# Patient Record
Sex: Male | Born: 1960
Health system: Southern US, Community
[De-identification: ages and names within clinical notes are randomized; demographics above are authoritative.]

## PROBLEM LIST (undated history)

## (undated) DIAGNOSIS — F17200 Nicotine dependence, unspecified, uncomplicated: Secondary | ICD-10-CM

## (undated) DIAGNOSIS — F79 Unspecified intellectual disabilities: Secondary | ICD-10-CM

## (undated) DIAGNOSIS — R7309 Other abnormal glucose: Secondary | ICD-10-CM

## (undated) DIAGNOSIS — J449 Chronic obstructive pulmonary disease, unspecified: Secondary | ICD-10-CM

## (undated) DIAGNOSIS — K219 Gastro-esophageal reflux disease without esophagitis: Secondary | ICD-10-CM

## (undated) DIAGNOSIS — G894 Chronic pain syndrome: Secondary | ICD-10-CM

## (undated) DIAGNOSIS — I10 Essential (primary) hypertension: Secondary | ICD-10-CM

## (undated) DIAGNOSIS — I2510 Atherosclerotic heart disease of native coronary artery without angina pectoris: Secondary | ICD-10-CM

## (undated) DIAGNOSIS — I82890 Acute embolism and thrombosis of other specified veins: Secondary | ICD-10-CM

## (undated) DIAGNOSIS — G4700 Insomnia, unspecified: Secondary | ICD-10-CM

## (undated) DIAGNOSIS — K449 Diaphragmatic hernia without obstruction or gangrene: Secondary | ICD-10-CM

## (undated) DIAGNOSIS — K861 Other chronic pancreatitis: Secondary | ICD-10-CM

## (undated) DIAGNOSIS — R569 Unspecified convulsions: Secondary | ICD-10-CM

## (undated) DIAGNOSIS — D649 Anemia, unspecified: Secondary | ICD-10-CM

## (undated) DIAGNOSIS — C3412 Malignant neoplasm of upper lobe, left bronchus or lung: Secondary | ICD-10-CM

## (undated) DIAGNOSIS — E46 Unspecified protein-calorie malnutrition: Secondary | ICD-10-CM

## (undated) DIAGNOSIS — R911 Solitary pulmonary nodule: Secondary | ICD-10-CM

## (undated) DIAGNOSIS — Z923 Personal history of irradiation: Secondary | ICD-10-CM

## (undated) HISTORY — PX: CORONARY ANGIOPLASTY WITH STENT PLACEMENT: SHX49

## (undated) HISTORY — DX: Other chronic pancreatitis: K86.1

## (undated) HISTORY — DX: Malignant neoplasm of upper lobe, left bronchus or lung: C34.12

## (undated) HISTORY — DX: Chronic pain syndrome: G89.4

## (undated) HISTORY — DX: Unspecified convulsions: R56.9

## (undated) HISTORY — DX: Insomnia, unspecified: G47.00

## (undated) HISTORY — DX: Gastro-esophageal reflux disease without esophagitis: K21.9

## (undated) HISTORY — DX: Nicotine dependence, unspecified, uncomplicated: F17.200

## (undated) HISTORY — DX: Other abnormal glucose: R73.09

## (undated) HISTORY — DX: Essential (primary) hypertension: I10

## (undated) HISTORY — DX: Anemia, unspecified: D64.9

## (undated) HISTORY — DX: Solitary pulmonary nodule: R91.1

## (undated) HISTORY — DX: Chronic obstructive pulmonary disease, unspecified: J44.9

---

## 2000-07-29 ENCOUNTER — Emergency Department (HOSPITAL_COMMUNITY): Admission: EM | Admit: 2000-07-29 | Discharge: 2000-07-29 | Payer: Self-pay | Admitting: Emergency Medicine

## 2001-06-05 ENCOUNTER — Emergency Department (HOSPITAL_COMMUNITY): Admission: EM | Admit: 2001-06-05 | Discharge: 2001-06-05 | Payer: Self-pay

## 2007-05-20 ENCOUNTER — Ambulatory Visit: Admission: RE | Admit: 2007-05-20 | Discharge: 2007-05-20 | Payer: Self-pay | Admitting: Family Medicine

## 2008-01-13 DIAGNOSIS — E46 Unspecified protein-calorie malnutrition: Secondary | ICD-10-CM

## 2008-01-13 HISTORY — DX: Unspecified protein-calorie malnutrition: E46

## 2008-11-28 HISTORY — PX: PORTACATH PLACEMENT: SHX2246

## 2008-12-11 ENCOUNTER — Ambulatory Visit (HOSPITAL_COMMUNITY): Admission: RE | Admit: 2008-12-11 | Discharge: 2008-12-11 | Payer: Self-pay | Admitting: Internal Medicine

## 2008-12-11 HISTORY — PX: GASTROSTOMY-JEJEUNOSTOMY TUBE CHANGE/PLACEMENT: SHX1705

## 2009-03-26 HISTORY — PX: EUS: SHX5427

## 2010-02-02 ENCOUNTER — Encounter (INDEPENDENT_AMBULATORY_CARE_PROVIDER_SITE_OTHER): Payer: Self-pay | Admitting: Internal Medicine

## 2010-05-30 NOTE — Procedures (Signed)
NAME:  Marcus Brooks, Marcus Brooks                 ACCOUNT NO.:  [id number]   MEDICAL RECORD NO.:  [id number]          PATIENT TYPE:  OUT   LOCATION:  SLEE                          FACILITY:  [facility]   PHYSICIAN:  Kofi A. Gerilyn Pilgrim, M.D. DATE OF BIRTH:  1960-02-11   DATE OF PROCEDURE:  DATE OF DISCHARGE:  05/20/2007                             SLEEP DISORDER REPORT   REFERRING PHYSICIAN:  Kofi A. Doonquah, MD.   INDICATIONS:  A 50 year old man who has snoring and has been evaluated  for obstructive sleep apnea syndrome.  BMI 20.  Epworth sleepiness scale  7.   MEDICATIONS:  Trazodone, phenytoin, omeprazole, hydrocodone, loratadine,  and Creon.   SLEEP STAGE SUMMARY:  The total recording time was 427 minutes.  Sleep  efficiency 97%.  Sleep latency 5 minutes.  REM latency 70 minutes.  Stage N1 2%, N2 31%, N3 42%, and REM sleep 25%.   RESPIRATORY SUMMARY:  AHI is 1.2, baseline saturation 96%, and lowest  saturation 90%.   LIMB MOVEMENT SUMMARY:  The PLM index is 0.   ELECTROCARDIOGRAM SUMMARY:  Average heart rate 71 with no dysrhythmias  observed.   IMPRESSION:  Essentially unremarkable nocturnal polysomnography.      Kofi A. Gerilyn Pilgrim, M.D.  Electronically Signed     KAD/MEDQ  D:  05/21/2007  T:  05/22/2007  Job:  [id number]

## 2010-07-13 ENCOUNTER — Inpatient Hospital Stay (HOSPITAL_COMMUNITY)
Admission: EM | Admit: 2010-07-13 | Discharge: 2010-07-17 | DRG: 439 | Disposition: A | Discharge status: Home Health | Payer: Medicaid Other | Attending: Internal Medicine | Admitting: Internal Medicine

## 2010-07-13 DIAGNOSIS — I2510 Atherosclerotic heart disease of native coronary artery without angina pectoris: Secondary | ICD-10-CM | POA: Diagnosis present

## 2010-07-13 DIAGNOSIS — K9402 Colostomy infection: Secondary | ICD-10-CM | POA: Diagnosis present

## 2010-07-13 DIAGNOSIS — G40909 Epilepsy, unspecified, not intractable, without status epilepticus: Secondary | ICD-10-CM | POA: Diagnosis present

## 2010-07-13 DIAGNOSIS — E559 Vitamin D deficiency, unspecified: Secondary | ICD-10-CM | POA: Diagnosis present

## 2010-07-13 DIAGNOSIS — K859 Acute pancreatitis without necrosis or infection, unspecified: Principal | ICD-10-CM | POA: Diagnosis present

## 2010-07-13 DIAGNOSIS — B49 Unspecified mycosis: Secondary | ICD-10-CM | POA: Diagnosis present

## 2010-07-13 DIAGNOSIS — K861 Other chronic pancreatitis: Secondary | ICD-10-CM | POA: Diagnosis present

## 2010-07-13 DIAGNOSIS — D638 Anemia in other chronic diseases classified elsewhere: Secondary | ICD-10-CM | POA: Diagnosis present

## 2010-07-13 DIAGNOSIS — E538 Deficiency of other specified B group vitamins: Secondary | ICD-10-CM | POA: Diagnosis present

## 2010-07-13 DIAGNOSIS — IMO0002 Reserved for concepts with insufficient information to code with codable children: Secondary | ICD-10-CM | POA: Diagnosis present

## 2010-07-13 HISTORY — PX: PANCREATIC PSEUDOCYST DRAINAGE: SHX2158

## 2010-07-13 HISTORY — PX: JEJUNOSTOMY FEEDING TUBE: SUR737

## 2010-07-13 LAB — CBC
MCV: 96 fL (ref 78.0–100.0)
Platelets: 413 10*3/uL — ABNORMAL HIGH (ref 150–400)
RDW: 14.1 % (ref 11.5–15.5)

## 2010-07-13 LAB — COMPREHENSIVE METABOLIC PANEL
BUN: 15 mg/dL (ref 6–23)
CO2: 24 mEq/L (ref 19–32)
Calcium: 10.1 mg/dL (ref 8.4–10.5)
Creatinine, Ser: 0.63 mg/dL (ref 0.50–1.35)
GFR calc Af Amer: >60 mL/min (ref >60)
GFR calc non Af Amer: >60 mL/min (ref >60)
Glucose, Bld: 162 mg/dL — ABNORMAL HIGH (ref 70–99)
Total Protein: 8.9 g/dL — ABNORMAL HIGH (ref 6.0–8.3)

## 2010-07-13 LAB — DIFFERENTIAL
Basophils Relative: 0 % (ref 0–1)
Blasts: 0 %
Eosinophils Absolute: 0.4 10*3/uL (ref 0.0–0.7)
Lymphs Abs: 1.9 10*3/uL (ref 0.7–4.0)
Myelocytes: 0 %
Neutro Abs: 17.4 10*3/uL — ABNORMAL HIGH (ref 1.7–7.7)
nRBC: 0 /100 WBC

## 2010-07-14 ENCOUNTER — Inpatient Hospital Stay (HOSPITAL_COMMUNITY): Payer: Medicaid Other

## 2010-07-14 LAB — COMPREHENSIVE METABOLIC PANEL
ALT: 39 U/L (ref 0–53)
AST: 35 U/L (ref 0–37)
Calcium: 9.4 mg/dL (ref 8.4–10.5)
Creatinine, Ser: 0.62 mg/dL (ref 0.50–1.35)
GFR calc Af Amer: >60 mL/min (ref >60)
GFR calc non Af Amer: >60 mL/min (ref >60)
Glucose, Bld: 176 mg/dL — ABNORMAL HIGH (ref 70–99)
Sodium: 135 mEq/L (ref 135–145)
Total Protein: 8 g/dL (ref 6.0–8.3)

## 2010-07-14 LAB — APTT: aPTT: 62 seconds — ABNORMAL HIGH (ref 24–37)

## 2010-07-14 LAB — PROTIME-INR: Prothrombin Time: 18.1 seconds — ABNORMAL HIGH (ref 11.6–15.2)

## 2010-07-14 LAB — CBC
MCV: 97.1 fL (ref 78.0–100.0)
Platelets: 396 10*3/uL (ref 150–400)
RBC: 2.74 MIL/uL — ABNORMAL LOW (ref 4.22–5.81)
RDW: 14.2 % (ref 11.5–15.5)
WBC: 19 10*3/uL — ABNORMAL HIGH (ref 4.0–10.5)

## 2010-07-14 LAB — DIFFERENTIAL
Basophils Relative: 0 % (ref 0–1)
Eosinophils Absolute: 0.3 10*3/uL (ref 0.0–0.7)
Monocytes Relative: 8 % (ref 3–12)
Neutro Abs: 14.9 10*3/uL — ABNORMAL HIGH (ref 1.7–7.7)
Neutrophils Relative %: 79 % — ABNORMAL HIGH (ref 43–77)

## 2010-07-14 LAB — PHOSPHORUS: Phosphorus: 3.4 mg/dL (ref 2.3–4.6)

## 2010-07-15 ENCOUNTER — Inpatient Hospital Stay (HOSPITAL_COMMUNITY): Payer: Medicaid Other

## 2010-07-15 DIAGNOSIS — K859 Acute pancreatitis without necrosis or infection, unspecified: Secondary | ICD-10-CM

## 2010-07-15 DIAGNOSIS — Z931 Gastrostomy status: Secondary | ICD-10-CM

## 2010-07-15 LAB — DIFFERENTIAL
Basophils Absolute: 0.1 10*3/uL (ref 0.0–0.1)
Basophils Relative: 1 % (ref 0–1)
Eosinophils Absolute: 0.4 10*3/uL (ref 0.0–0.7)
Eosinophils Relative: 3 % (ref 0–5)
Lymphocytes Relative: 17 % (ref 12–46)
Lymphs Abs: 2.6 10*3/uL (ref 0.7–4.0)
Monocytes Absolute: 1.2 10*3/uL — ABNORMAL HIGH (ref 0.1–1.0)
Monocytes Relative: 8 % (ref 3–12)
Neutro Abs: 10.8 10*3/uL — ABNORMAL HIGH (ref 1.7–7.7)
Neutrophils Relative %: 71 % (ref 43–77)

## 2010-07-15 LAB — CBC
HCT: 25.8 % — ABNORMAL LOW (ref 39.0–52.0)
Hemoglobin: 8.7 g/dL — ABNORMAL LOW (ref 13.0–17.0)
MCH: 32.5 pg (ref 26.0–34.0)
MCHC: 33.7 g/dL (ref 30.0–36.0)
MCV: 96.3 fL (ref 78.0–100.0)
Platelets: 430 10*3/uL — ABNORMAL HIGH (ref 150–400)
RBC: 2.68 MIL/uL — ABNORMAL LOW (ref 4.22–5.81)
RDW: 14.1 % (ref 11.5–15.5)
WBC: 15.1 10*3/uL — ABNORMAL HIGH (ref 4.0–10.5)

## 2010-07-15 LAB — BASIC METABOLIC PANEL
BUN: 9 mg/dL (ref 6–23)
CO2: 22 mEq/L (ref 19–32)
Calcium: 8.4 mg/dL (ref 8.4–10.5)
Chloride: 104 mEq/L (ref 96–112)
Creatinine, Ser: 0.72 mg/dL (ref 0.50–1.35)
GFR calc Af Amer: >60 mL/min (ref >60)
GFR calc non Af Amer: >60 mL/min (ref >60)
Glucose, Bld: 128 mg/dL — ABNORMAL HIGH (ref 70–99)
Potassium: 3.5 mEq/L (ref 3.5–5.1)
Sodium: 137 mEq/L (ref 135–145)

## 2010-07-16 LAB — CBC
HCT: 25.7 % — ABNORMAL LOW (ref 39.0–52.0)
Hemoglobin: 8.6 g/dL — ABNORMAL LOW (ref 13.0–17.0)
MCH: 32.3 pg (ref 26.0–34.0)
MCHC: 33.5 g/dL (ref 30.0–36.0)
MCV: 96.6 fL (ref 78.0–100.0)
Platelets: 435 10*3/uL — ABNORMAL HIGH (ref 150–400)
RBC: 2.66 MIL/uL — ABNORMAL LOW (ref 4.22–5.81)
RDW: 14.2 % (ref 11.5–15.5)
WBC: 9.4 10*3/uL (ref 4.0–10.5)

## 2010-07-16 LAB — DIFFERENTIAL
Basophils Absolute: 0.1 10*3/uL (ref 0.0–0.1)
Basophils Relative: 1 % (ref 0–1)
Eosinophils Absolute: 0.5 10*3/uL (ref 0.0–0.7)
Eosinophils Relative: 5 % (ref 0–5)
Lymphocytes Relative: 23 % (ref 12–46)
Lymphs Abs: 2.2 10*3/uL (ref 0.7–4.0)
Monocytes Absolute: 1 10*3/uL (ref 0.1–1.0)
Monocytes Relative: 10 % (ref 3–12)
Neutro Abs: 5.6 10*3/uL (ref 1.7–7.7)
Neutrophils Relative %: 60 % (ref 43–77)

## 2010-07-16 LAB — BASIC METABOLIC PANEL
BUN: 8 mg/dL (ref 6–23)
CO2: 22 mEq/L (ref 19–32)
Calcium: 8.4 mg/dL (ref 8.4–10.5)
Chloride: 106 mEq/L (ref 96–112)
Creatinine, Ser: 0.62 mg/dL (ref 0.50–1.35)
GFR calc Af Amer: >60 mL/min (ref >60)
GFR calc non Af Amer: >60 mL/min (ref >60)
Glucose, Bld: 124 mg/dL — ABNORMAL HIGH (ref 70–99)
Potassium: 3.9 mEq/L (ref 3.5–5.1)
Sodium: 136 mEq/L (ref 135–145)

## 2010-07-16 LAB — IRON AND TIBC: UIBC: 116 ug/dL

## 2010-07-17 LAB — CBC
HCT: 27 % — ABNORMAL LOW (ref 39.0–52.0)
Hemoglobin: 9.1 g/dL — ABNORMAL LOW (ref 13.0–17.0)
MCH: 32.7 pg (ref 26.0–34.0)
MCHC: 33.7 g/dL (ref 30.0–36.0)

## 2010-07-17 LAB — COMPREHENSIVE METABOLIC PANEL
ALT: 55 U/L — ABNORMAL HIGH (ref 0–53)
AST: 47 U/L — ABNORMAL HIGH (ref 0–37)
Alkaline Phosphatase: 93 U/L (ref 39–117)
CO2: 21 mEq/L (ref 19–32)
Calcium: 8.7 mg/dL (ref 8.4–10.5)
GFR calc non Af Amer: >60 mL/min (ref >60)
Potassium: 4.4 mEq/L (ref 3.5–5.1)
Sodium: 135 mEq/L (ref 135–145)

## 2010-07-17 LAB — DIFFERENTIAL
Basophils Absolute: 0.2 10*3/uL — ABNORMAL HIGH (ref 0.0–0.1)
Eosinophils Absolute: 0.5 10*3/uL (ref 0.0–0.7)
Eosinophils Relative: 4 % (ref 0–5)
Lymphocytes Relative: 17 % (ref 12–46)
Lymphs Abs: 2.1 10*3/uL (ref 0.7–4.0)
Monocytes Absolute: 0.8 10*3/uL (ref 0.1–1.0)

## 2010-07-17 LAB — VITAMIN B12: Vitamin B-12: >2000 pg/mL — ABNORMAL HIGH (ref 211–911)

## 2010-07-18 LAB — CULTURE, BLOOD (ROUTINE X 2): Culture: NO GROWTH

## 2010-07-21 LAB — FUNGUS CULTURE, BLOOD
Culture: NO GROWTH
Culture: NO GROWTH

## 2010-07-22 NOTE — Consult Note (Signed)
Marcus Brooks, Marcus Brooks                 ACCOUNT NO.:  [id number]  MEDICAL RECORD NO.:  [id number]  LOCATION:  A331                          FACILITY:  [facility]  PHYSICIAN:  Lionel December, M.D.    DATE OF BIRTH:  August 06, 1960  DATE OF CONSULTATION:  07/15/2010 DATE OF DISCHARGE:                                CONSULTATION REASON FOR CONSULTATION. G/J tube problems.   HISTORY OF PRESENT ILLNESS:  Marcus Brooks is a 50 year old male admitted through the emergency department Sunday night at [facility].  He was complaining of abdominal pain and back pain.  He was also complaining of pain at his PEG (J-tube) site.  He was actually seen at [facility] Friday night for fever and abdominal pain.  He was told he had acute pancreatitis.  He underwent a CT of the abdomen and pelvis at [facility], which revealed extensive presumed inflammatory process in the left upper quadrant, extending into the left lower chest and retrocrural spaces with multiple loculated fluid collections, retroperitoneal lymphadenopathy, small left pleural effusion, and focal cavitary lesion in the left lung.  Changes of chronic calcific pancreatitis.  Findings most likely represent chronic inflammatory process with pseudocyst related to chronic pancreatitis.  Neoplasm is not excluded.  Findings likely represent organizing changes since the previous study.  No definite evidence of abscess.  He also underwent blood cultures at [facility].  The blood cultures on July 11, 2010, revealed budding yeast.  Marcus Brooks was called this report Sunday night.  He left AMA at [facility] Sunday.  PAST MEDICAL HISTORY:  Includes chronic pancreatitis, seizures.  He has a cardiac stent, hypertension.  He has got a J tube.  Vitamin D deficiency.  ALLERGIES:  No known allergies.  SOCIAL HISTORY:  He smokes 1 pack a day.  He says he was drinking EtOH, he quit 4-5 years ago.  No drugs and he is separated.  He has 1 child in good health.  He  is adopted.  FAMILY HISTORY:  His father and mother deceased from unknown causes. One brother in good health.  HOME MEDICATIONS: 1. Fentanyl patch 50 mcg an hour. 2. Hydrocodone/APAP 10/325 q.6 as needed. 3. Sumatriptan 100 mg as needed. 4. Potassium 10 mEq a day. 5. Loratadine 10 mg a day. 6. Amitriptyline 25 mg at night. 7. Loperamide 2 mg as needed. 8. Diazepam 10 mg b.i.d. 9. Benadryl 25 mg q.6. 10.Omeprazole 20 mg b.i.d. 11.Phenergan 25 mg p.o. 12.Flonase 1 puff daily. 13.Neurontin 300 mg 2 tabs a day. 14.Vitamin D3 1000 units a day. 15.Vitamin B12 1000 mcg IM a month. 16.Norvasc 10 mg a day. 17.Trazodone 100 mg at bedtime. 18.Albuterol inhaler q.6 2 puffs p.r.n. 19.Dilantin 100 mg t.i.d. 20.HCTZ 25 mg a day.  OBJECTIVE:  VITAL SIGNS:  His temp is 99.3, pulse 96, respirations 20, blood pressure 145/79, his O2 sat is 93%, his weight is 74.84, and his height is 5 feet 7 inches. HEENT:  His sclerae are anicteric.  His conjunctivae are pink.  He is an edentulous.  His oral mucosa is moist. NECK:  His thyroid is normal.  There is no cervical lymphadenopathy. LUNGS:  Clear. HEART:  Regular rate and  rhythm. ABDOMEN:  Slightly tense.  He has diffuse abdominal tenderness.  His bowel sounds are positive.  He does have yellow drainage around his J tube. J tubing is discolored a brownish discoloration.  LABS:  Today's date, his WBC count is 15.1, elevated, RBC 2.68, hemoglobin is low at 8.7, hematocrit is 25.8, MCV is 96.8, platelet count is 430, neutrophils 10.8, monocytes 1.2.  His sodium is 137, potassium is 3.5, chloride is 104, glucose is 128, BUN is 9, creatinine is 0.72, calcium is 8.4.  His lipase on July 13, 2010, was 506.  Dilantin level was less than 2.5.  He did undergo a chest x-ray which revealed a left lower lobe atelectasis or infiltrate with small left effusion.  ASSESSMENT:  Marcus Brooks is a 50 year old male admitted with acute pancreatitis.  He does have a history  of pseudocyst.  His blood cultures revealed budding yeast.  He does have a drainage around his J tube.  I did discuss this case with Dr. Karilyn Cota.  RECOMMENDATIONS: I have arrnged for patient to go to [facility] for  PEG/PEJ exchange later today.  Thank you for allowing [place] to participate in his care.    ______________________________ Dorene Ar, NP   ______________________________ Lionel December, M.D.    TS/MEDQ  D:  07/15/2010  T:  07/15/2010  Job:  [id number]  Electronically Signed by Dorene Ar PA on 07/17/2010 08:57:17 AM Electronically Signed by Lionel December M.D. on 07/22/2010 10:14:22 PM

## 2010-07-28 NOTE — H&P (Signed)
NAME:  Marcus Brooks, Marcus Brooks                 ACCOUNT NO.:  [id number]  MEDICAL RECORD NO.:  [id number]  LOCATION:  A331                          FACILITY:  [facility]  PHYSICIAN:  Merit Maybee, DO         DATE OF BIRTH:  August 06, 1960  DATE OF ADMISSION:  07/13/2010 DATE OF DISCHARGE:  LH                             HISTORY & PHYSICAL   CHIEF COMPLAINT:  Abdominal pain and report of positive blood cultures.  HISTORY OF PRESENT ILLNESS:  The patient is a 50 year old chronically ill male who was in the emergency department at [facility] on July 11, 2010, where he was to be admitted for an acute exacerbation of chronic pancreatitis and possible malfunction of his feeding tube. The wait was extended.  According to the patient's wife, there was some trouble with an associated hospital in [place] and the patients were being transferred down to [facility] and so they were unable to get him a bed in a timely manner.  The patient also complains that he felt that he was not given sufficient pain medicine and so he left against medical advice.  The patient's wife was contacted today until one of his two blood cultures came back positive with budding yeast.  The patient has continued to have pain.  He has not been using his J-tube today, and he presents here for the above chief complaint.  PAST MEDICAL HISTORY:  Significant for: 1. Chronic pancreatitis. 2. Pancreatic insufficiency. 3. Possible fungal bacteremia. 4. J-tube.  The J-tube was evaluated at [facility] and it was     found to have the tip in the proximal jejunum loop.  He uses 6 cans     of Ensure daily. 5. He has chronic seizure disorder, for which he takes Dilantin. 6. Coronary artery disease status post stent. 7. Hypertension. 8. Vitamin D deficiency. 9. Vitamin B12 deficiency.  PAST SURGICAL HISTORY:  Significant for: 1. Left heart cath and stent. 2. J-tube.  SOCIAL HISTORY:  The patient is on disability.  He  apparently was once a heavy drinker.  He denies any current alcohol use.  He smokes 1-1/2 packs per day.  He has an electronic cigarette with him.  He lives at home with his wife.  FAMILY MEDICAL HISTORY:  The patient is adopted.  MEDICATIONS:  This is as per the [facility] list: 1. Amlodipine 10 mg one p.o. daily. 2. Diazepam 10 mg one p.o. b.i.d. p.r.n. anxiety. 3. Benadryl 25 mg one p.o. q.i.d. p.r.n. allergies. 4. Flonase nasal spray daily. 5. Dilantin 300 mg one p.o. nightly. 6. Phenergan 25 mg q.6 h. p.r.n. nausea. 7. Omeprazole 20 mg one p.o. b.i.d. 8. Hydrochlorothiazide 25 mg one p.o. daily. 9. Gabapentin 300 mg 2 tablets p.o. b.i.d. 10.Albuterol inhaler as needed for shortness of breath. 11.Vitamin B12 - 1000 mcg injected IM once monthly. 12.Vitamin D 50,000 units daily. 13.Trazodone 100 mg nightly as needed for sleep. 14.Imodium p.r.n. for constipation. 15.Potassium 10 mEq one p.o. daily. 16.Hydrocodone and acetaminophen 10/650 one p.o. q.6 h. as needed for     pain. 17.Duragesic patch 50 mcg, changed every 3 days for chronic abdominal  pain. 18.Imitrex for migraines. 19.Amitriptyline 25 mg nightly.  DRUG ALLERGIES:  No known drug allergies.  REVIEW OF SYSTEMS:  CONSTITUTIONAL:  Positive for fever, positive for chills, positive for weakness, positive for  malaise.  CENTRAL NERVOUS SYSTEM:  Negative for specific limb weakness.  Negative for seizure. Negative for loss of consciousness.  ENT:  No nasal congestion, throat pain.  No coryza.  CARDIOVASCULAR:  No chest pain, no palpitations, no orthopnea.  RESPIRATORY:  Positive for cough.  Positive for wheeze. Positive for shortness of breath.  No sputum production. GASTROINTESTINAL:  Positive for abdominal pain.  Positive for nausea. Positive for vomiting.  Positive for chronic constipation.  Negative for diarrhea.  GENITOURINARY:  No dysuria, no hematuria, no urinary frequency.  RENAL:  No flank  pain, no swelling, no pruritus.  SKIN: Positive for tattoos, otherwise, no rashes, no sores, no lesions. HEMATOLOGIC:  No easy bruising.  No purpura.  No clots.  LYMPHATIC:  No lymphadenopathy.  No painful lymph nodes.  No specific lymph swelling. PSYCHIATRIC:  Positive for anxiety.  Negative for depression.  Positive for insomnia.  PHYSICAL EXAMINATION:  VITAL SIGNS:  Heart rate 80, temperature 98.3, blood pressure 123/62, respiratory rate 20.  The patient was satting 97% on room air. GENERAL:  The patient is an unkempt male who has a severely slurred speech, although perhaps this is his norm.  His wife understands them, I cannot understand this man.  He is awake, alert, and oriented x3 and apparently responds appropriately.  His wife is in the room and she is able to relate what the patient is saying, and he does not follow commands. HEENT:  Eyes:  Pupils equal, round, and reactive to light and accommodation.  External ocular movements bilaterally intact.  Sclerae nonicteric, noninjected.  Mouth:  Oral mucosa dry.  No lesions.  No sores.  Pharynx clear.  No erythema, no exudate. NECK:  Negative for JVD.  Negative thyromegaly.  Negative lymphadenopathy. HEART:  Regular rate and rhythm at 90 beats per minute without murmur, ectopy, or gallops.  No lateral PMI.  No thrills. LUNGS:  Positive for rhonchi and wheezes throughout.  No rales. Negative for increased work of breathing.  No tactile fremitus. ABDOMEN:  Difficult to examine as the patient is voluntarily guarding. Anywhere I even touch him, he cries out in pain.  Positive bowel sounds. I am unable to evaluate for organomegaly due to the patient's voluntary guarding.  No hernias palpated.  CARDIOVASCULAR:  Extremities are negative for cyanosis, clubbing, or edema.  The patient has somewhat diminished dorsalis pedis and popliteal pulses bilaterally.  No carotid bruits bilaterally. NEUROLOGIC:  The patient is moving all extremities.   Cranial nerves II through XII are grossly intact.  Motor and sensory intact.  LABORATORY DATA:  WBC is 21.2, hemoglobin 9.9, hematocrit 29.0, platelets 413 with 82% neutrophils.  Sodium 134, potassium 3.8, chloride 100, CO2 is 24, BUN 15, creatinine 0.63, glucose 162, T. bili 0.7, alk phos 123, AST 38, ALT 44, total protein 8.9, albumin 2.1, lipase 506, lactic acid 1.2.  EKG is pending. Chest x-ray is pending.  ASSESSMENT: 1. Acute exacerbation of acute pancreatitis. 2. Pancreatic pseudocyst as per radiological report for [facility]. 3. Budding yeast in blood cultures one out of two from [facility]. 4. Acute bronchitis on chronic bronchitis. 5. Chronic pain and opioid dependence. 6. Hypertension. 7. Pancreatic insufficiency. 8. Vitamin deficiency, D and B12.  PLAN: 1.  Admit.  Continue tube feedings but have Nutrition evaluate the     patient's needs. 2. IV antibiotics. 3. Antifungals.  I have discussed with the emergency room staff the     need to recheck blood cultures and those have been drawn. 4. Continue home meds. 5. Check a Dilantin level. 6. Pain control.  I have spent 48 minutes on this admission.                                           ______________________________ Fran Lowes, DO     AS/MEDQ  D:  07/13/2010  T:  07/14/2010  Job:  [id number]  cc:   Ernestine Conrad, MD Fax: [phone]  Electronically Signed by Fran Lowes DO on 07/28/2010 01:46:39 PM

## 2010-07-30 NOTE — Discharge Summary (Signed)
Marcus Brooks, Marcus Brooks                 ACCOUNT NO.:  [id number]  MEDICAL RECORD NO.:  [id number]  LOCATION:  A331                          FACILITY:  [facility]  PHYSICIAN:  Marcus Brooks, M.D. DATE OF BIRTH:  07-08-1960  DATE OF ADMISSION:  07/13/2010 DATE OF DISCHARGE:  07/05/2012LH                              DISCHARGE SUMMARY   PATIENT'S GASTROENTEROLOGIST:  Marcus December, MD  DISCHARGE DIAGNOSES: 1. Chronic pancreatitis with acute flare. 2. Reported fungemia from [facility] with 1 out of 2 blood     cultures growing budding yeast. 3. Normocytic anemia, secondary to anemia of chronic disease with     hemoglobin that has been stable in the 8.5-9 range.  No     transfusions this hospitalization. 4. Vitamin B12 deficiency. 5. Vitamin D deficiency. 6. Chronic seizure disorder. 7. Coronary artery disease.  DISCHARGE MEDICATIONS: 1. Fluconazole 100 mg daily for 14 days. 2. Vital AF 1.2 at 70 mL an hour continuously. 3. Albuterol inhaler 2 puffs every 6 h. as needed for shortness of     breath. 4. Amitriptyline 25 mg at bedtime. 5. Norvasc 10 mg daily. 6. Benadryl 25 mg every 6 h. as needed for itching. 7. Diazepam 10 mg twice daily. 8. Dilantin 100 mg 3 times a day. 9. Fentanyl patch 50 mcg to change every 72 h. 10.Flonase 1 spray daily. 11.Vicodin 10/325 mg every 6 h. as needed for pain. 12.Loperamide 2 mg daily as needed for diarrhea. 13.Loratadine 10 mg daily. 14.Neurontin 300 mg 2 tablets twice daily. 15.Omeprazole 20 mg twice daily. 16.Potassium chloride 10 mEq daily. 17.Sumatriptan 100 mg as needed for migraines. 18.Trazodone 100 mg at bedtime. 19.Vitamin B12 1000 mcg intramuscularly monthly. 20.Vitamin D3 1000 units daily.  DISPOSITION AND FOLLOWUP:  Marcus Brooks will be discharged home today in stable and improved condition.  He is tolerating his J-tube feedings without any abdominal pain.  He will need to have repeat CT abdomen and pelvis in 3 weeks to  follow up on his pseudocyst and following that he will need to follow up with Marcus Brooks.  The patient has been made aware of the need to schedule followup with Marcus Brooks.  CONSULTATION THIS HOSPITALIZATION:  Marcus December, MD  IMAGES AND PROCEDURES:  Chest x-ray on July 14, 2010, that showed a left lower lobe atelectasis with effusion.  HISTORY AND PHYSICAL:  For complete details, please refer to dictation on July 13, 2010, by Dr. Gerri Brooks.  However in brief, Marcus Brooks is a 50- year-old Caucasian gentleman with a history of chronic pancreatic insufficiency who apparently went to [facility] on July 11, 2010, with abdominal pain and was to be admitted for an acute flare-up of his chronic pancreatitis.  However, there was a delay in transitioning into the floor given the volume of the patient's at [facility] and he decided to leave the emergency department AMA, 24 hours after this 1 out of 2 blood cultures came back positive with budding yeast and the patient was called and he decided to come to [facility] for evaluation.  HOSPITAL COURSE BY PROBLEM: 1. Abdominal pain.  This has now resolved.  He has been seen in  consultation by Marcus Brooks.  Given his fungemia and possible source     being his J-tube, this has been exchanged.  He is now tolerating     feedings through his J-tube without any incident.  He will be     discharged home today to continue his tube feeds.  Marcus Brooks will     like him to have a repeat CT abdomen, pelvis in 3 weeks to follow     up on his pseudocyst and following these results will need to see     him in the office. 2. One out of four blood cultures with budding yeast.  This is a     report from [facility]. 3. Fungal blood cultures here in the hospital all are negative to     date.  Regardless, I will discharge him on a 14-day course of     fluconazole.  Rest of chronic conditions are stable.  VITALS ON DAY OF DISCHARGE:  Blood  pressure 114/72, heart rate 75, respirations 19, temperature of 97.3 and sats were 96% on room air.     Marcus Brooks, M.D.     EH/MEDQ  D:  07/17/2010  T:  07/18/2010  Job:  [id number]  cc:   Marcus Brooks, M.D. Fax: [phone]  Electronically Signed by Marcus Brooks M.D. on 07/30/2010 07:40:50 AM

## 2010-08-04 DIAGNOSIS — J4489 Other specified chronic obstructive pulmonary disease: Secondary | ICD-10-CM

## 2010-08-04 DIAGNOSIS — R7309 Other abnormal glucose: Secondary | ICD-10-CM

## 2010-08-04 DIAGNOSIS — G894 Chronic pain syndrome: Secondary | ICD-10-CM

## 2010-08-04 DIAGNOSIS — R569 Unspecified convulsions: Secondary | ICD-10-CM

## 2010-08-04 DIAGNOSIS — K861 Other chronic pancreatitis: Secondary | ICD-10-CM

## 2010-08-04 DIAGNOSIS — F17200 Nicotine dependence, unspecified, uncomplicated: Secondary | ICD-10-CM

## 2010-08-04 DIAGNOSIS — I10 Essential (primary) hypertension: Secondary | ICD-10-CM

## 2010-08-04 DIAGNOSIS — G4700 Insomnia, unspecified: Secondary | ICD-10-CM

## 2010-08-04 DIAGNOSIS — J449 Chronic obstructive pulmonary disease, unspecified: Secondary | ICD-10-CM

## 2010-08-04 HISTORY — DX: Nicotine dependence, unspecified, uncomplicated: F17.200

## 2010-08-04 HISTORY — DX: Other chronic pancreatitis: K86.1

## 2010-08-04 HISTORY — DX: Other abnormal glucose: R73.09

## 2010-08-04 HISTORY — DX: Unspecified convulsions: R56.9

## 2010-08-04 HISTORY — DX: Chronic pain syndrome: G89.4

## 2010-08-04 HISTORY — DX: Essential (primary) hypertension: I10

## 2010-08-04 HISTORY — DX: Other specified chronic obstructive pulmonary disease: J44.89

## 2010-08-04 HISTORY — DX: Chronic obstructive pulmonary disease, unspecified: J44.9

## 2010-08-04 HISTORY — DX: Insomnia, unspecified: G47.00

## 2010-08-05 ENCOUNTER — Encounter (INDEPENDENT_AMBULATORY_CARE_PROVIDER_SITE_OTHER): Payer: Self-pay

## 2010-08-13 ENCOUNTER — Encounter (INDEPENDENT_AMBULATORY_CARE_PROVIDER_SITE_OTHER): Payer: Self-pay | Admitting: Internal Medicine

## 2010-08-13 ENCOUNTER — Ambulatory Visit (INDEPENDENT_AMBULATORY_CARE_PROVIDER_SITE_OTHER): Payer: Medicaid Other | Admitting: Internal Medicine

## 2010-08-13 ENCOUNTER — Telehealth (INDEPENDENT_AMBULATORY_CARE_PROVIDER_SITE_OTHER): Payer: Self-pay | Admitting: *Deleted

## 2010-08-13 VITALS — BP 100/62 | HR 76 | Temp 98.6°F | Ht 66.0 in | Wt 139.9 lb

## 2010-08-13 DIAGNOSIS — Z1211 Encounter for screening for malignant neoplasm of colon: Secondary | ICD-10-CM

## 2010-08-13 DIAGNOSIS — K861 Other chronic pancreatitis: Secondary | ICD-10-CM

## 2010-08-13 MED ORDER — SILVER NITRATE-POT NITRATE 75-25 % EX MISC: Freq: Every day | CUTANEOUS | Status: AC

## 2010-08-13 NOTE — Progress Notes (Signed)
Subjective:     Patient ID: Marcus Brooks, male   DOB: Jun 08, 1960, 50 y.o.   MRN: [id number]  HPI  Marcus Brooks is a 50 yr old male presenting today for follow up. He has a hx of chronic pancreatitis with pseudocyst.  He  Was recently admitted to [facility] for pancreatitis. CT abd and pelvic with CM on 07/11/2010 revealed extensive presumed inflammatory process in the left upper quadrant extending into the left lower chest and retrocrural spaces with multiple loculated fluid collections, retroperitoneal lymphadenopathy, small left pleural effusion, and focal cavitary lesion in the left lung base.  Changes of chronic calcific pancreatitis. Findings most likely represent chronic inflammatory process with pseudocyst related to chronic pancreatitis. Neoplasm is not excluded.  Findings likely represent organizing changes since the previous study. No definite evidence of abscess.  He says his appetite is okay. He is only drinking milk and tube feeding.  He has gained from 133 to 139.9.  He does c/o of some upper abdominal pain.  J tube is flushing okay. No problems. He is using 4 cans of supplement for his feeding.  He is having one a day. No rectal bleeding or melena. Marcus Brooks was also referred to our office by Dr. Loney Hering for a screening colonoscopy today. J tube was changed last month.    07/15/2010 Guidance: Fluoroscopic  Fluoroscopy time: 0.4 minutes  Sedation time: None.  Contrast volume: 10 ml Omnipaque-300  Complications: No immediate  PROCEDURE/FINDINGS:  Informed consent was obtained from the patient following  explanation of the procedure, risks, benefits and alternatives.  The patient understands, agrees and consents for the procedure.  All questions were addressed. A time out was performed.  Maximal barrier sterile technique utilized including caps, mask,  sterile gowns, sterile gloves, large sterile drape, hand hygiene,  and betadine  Under sterile conditions, the existing damaged G J conversion tube  was  removed over a stiff Glidewire. A new G J conversion tube was  advanced through the existing 20-French gastrostomy into the  proximal jejunum. Contrast injection confirms position. Catheter  ready for use. No immediate complication.  IMPRESSION:  Exchange of a gastrojejunostomy conversion tube. Feeding tube  ready for use.  Original Report Authenticated By: Judie Petit. Ruel Favors, M.D.     Component Results 07/17/2010      Component  Value  Range & Units  Status    Sodium  135  135 - 145 mEq/L  Final    Potassium  4.4  3.5 - 5.1 mEq/L  Final    Chloride  107  96 - 112 mEq/L  Final    CO2  21  19 - 32 mEq/L  Final    Glucose, Bld  164 (H)  70 - 99 mg/dL  Final    BUN  7  6 - 23 mg/dL  Final    Creatinine, Ser  0.57  0.50 - 1.35 mg/dL  Final    **Please note change in reference range.**    Calcium  8.7  8.4 - 10.5 mg/dL  Final    Total Protein  7.9  6.0 - 8.3 g/dL  Final    Albumin  1.9 (L)  3.5 - 5.2 g/dL  Final    AST  47 (H)  0 - 37 U/L  Final    ALT  55 (H)  0 - 53 U/L  Final    Alkaline Phosphatase  93  39 - 117 U/L  Final    Total Bilirubin  0.2 (L)  0.3 - 1.2 mg/dL  Final    GFR calc non Af Amer  >60  >60 mL/min  Final    GFR calc Af Amer  >60  >60 mL/min  Final     The eGFR has been calculated using the MDRD equation.  08/11/2010 WBC 13.6, H and H 120 and 35.9, MCV 96.6 08/04/2010 Glucose 178, BUN 23, Creatinine 0.74, Calcium 9.2, total protein 8.5, Albumin 2.9, ALP 89, AST 23, ALT 38, Total bili 0.3, Triglycerides 114, HA1C 5.9                 Review of Systems  See hpi    Objective:   Physical ExamAlert and oriented. Skin warm and dry. Oral mucosa is moist. Endentulous. Sclera anicteric, conjunctivae is pink. Thyroid not enlarged. No cervical lymphadenopathy. Lungs clear. Heart regular rate and rhythm.  Abdomen is soft. Bowel sounds are positive. No hepatomegaly. No abdominal masses felt. No tenderness.  Slight drainage from around J tube with slight overgrowth of  skin.  No edema to lower extremities. Patient is alert and oriented.  H Stomach has small amt of crust around  stoma.  Encouraged to keep stoma clean.     Assessment:    Chronic pancreatitis.  He recently had an admission for this.  He seems to be doing better at this time and he has actually gained weight.. Will schedule a CT abdomen/ and pelvic with CM for follow up of his pseudocyst.    r Plan:    He will follow up in 3 months with a C-met and CBC.  Apply neosporin to area around stoma.  Use silver nitrate to area that is irritating  Marcus Brooks has declined a screening colonoscopy at this time.  He will be approached again in 3 months concerning this. CT abdomen and pelvis with CM for f/u of his pseudocyst/ chronic  pancreatitis

## 2010-08-14 NOTE — Telephone Encounter (Signed)
Future labs ordered per Delrae Rend

## 2010-08-14 NOTE — Telephone Encounter (Deleted)
Future Labs ordered per Marcus Brooks

## 2010-11-05 ENCOUNTER — Encounter (INDEPENDENT_AMBULATORY_CARE_PROVIDER_SITE_OTHER): Payer: Self-pay | Admitting: *Deleted

## 2010-11-05 ENCOUNTER — Telehealth (INDEPENDENT_AMBULATORY_CARE_PROVIDER_SITE_OTHER): Payer: Self-pay | Admitting: Internal Medicine

## 2010-11-05 ENCOUNTER — Encounter (INDEPENDENT_AMBULATORY_CARE_PROVIDER_SITE_OTHER): Payer: Self-pay | Admitting: Internal Medicine

## 2010-11-05 ENCOUNTER — Other Ambulatory Visit (INDEPENDENT_AMBULATORY_CARE_PROVIDER_SITE_OTHER): Payer: Self-pay | Admitting: *Deleted

## 2010-11-05 ENCOUNTER — Ambulatory Visit (INDEPENDENT_AMBULATORY_CARE_PROVIDER_SITE_OTHER): Payer: Medicaid Other | Admitting: Internal Medicine

## 2010-11-05 VITALS — BP 104/60 | HR 72 | Temp 98.0°F | Ht 67.0 in | Wt 145.3 lb

## 2010-11-05 DIAGNOSIS — I10 Essential (primary) hypertension: Secondary | ICD-10-CM

## 2010-11-05 DIAGNOSIS — K920 Hematemesis: Secondary | ICD-10-CM

## 2010-11-05 DIAGNOSIS — K219 Gastro-esophageal reflux disease without esophagitis: Secondary | ICD-10-CM

## 2010-11-05 DIAGNOSIS — K921 Melena: Secondary | ICD-10-CM

## 2010-11-05 DIAGNOSIS — K861 Other chronic pancreatitis: Secondary | ICD-10-CM

## 2010-11-05 NOTE — Telephone Encounter (Signed)
I spoke with Marcus Brooks and he needs a screening colonoscopy

## 2010-11-05 NOTE — Telephone Encounter (Signed)
TCS added to EGD patient aware

## 2010-11-05 NOTE — Patient Instructions (Signed)
Will schedule and EGD and screening colonoscopy with Dr. Karilyn Cota. Will get a CBC on him today

## 2010-11-05 NOTE — Progress Notes (Signed)
Subjective:     Patient ID: Marcus Brooks, male   DOB: 1960-09-19, 50 y.o.   MRN: [id number]  HPI  Marcus Brooks is a 50 yr old male presenting today stating that he went to the ED at [facility] 3 days ago for vomiting blood. He vomited 2 days.  He says he vomited about a gallon of blood.  He did present to the ED for evaluation.  He was evaluated at sent home.  His H and H was 17.4 and 51.9, MCV  96.9. He was told to follow up with. He says he feels weak. His appetite is not good.   He c/o epigastric tenderness. He is using the feeding tube at night. No further hematemesis.  His stools are black he says x almost a week.  No weight loss. He does have a hx of anemia in the past.   Marcus Brooks has never undergone a screening colonoscopy. No NSAIDs. 7/402012 H and H 8.6 and 25.7 07/17/2010 Vit B 12 greater than 2,000, Folate 17.6, Ferritin 1170, Iron 25, TIBC 141, Sat 18, UIBC 116.   Review of Systems  See hpi Current Outpatient Prescriptions  Medication Sig Dispense Refill  . albuterol (PROVENTIL) (2.5 MG/3ML) 0.083% nebulizer solution Take by nebulization.        Marland Kitchen amLODipine-benazepril (LOTREL) 10-20 MG per capsule Take 1 capsule by mouth daily.        Marland Kitchen amylase-lipase-protease (LIPRAM-CR10) 33.02-22-35.5 MU per capsule Take by mouth 4 (four) times daily.       . calcium-vitamin D (OSCAL WITH D) 500-200 MG-UNIT per tablet Take 1 tablet by mouth daily.        . cyanocobalamin 1000 MCG tablet Take 100 mcg by mouth daily.        . diazepam (VALIUM) 10 MG tablet Take 10 mg by mouth every 6 (six) hours as needed.        . ergocalciferol (VITAMIN D2) 50000 UNITS capsule Take 50,000 Units by mouth once a week.        . fentaNYL (DURAGESIC - DOSED MCG/HR) 50 MCG/HR Place 1 patch onto the skin every 3 (three) days.        Marland Kitchen gabapentin (NEURONTIN) 600 MG tablet Take 600 mg by mouth 3 (three) times daily.        . hydrochlorothiazide 25 MG tablet Take 25 mg by mouth daily.        Marland Kitchen HYDROcodone-acetaminophen (LORCET) 10-650 MG per  tablet Take 1 tablet by mouth every 6 (six) hours as needed.        . loratadine (CLARITIN) 10 MG tablet Take 10 mg by mouth daily.        Marland Kitchen omeprazole (PRILOSEC) 40 MG capsule Take 40 mg by mouth daily.        . phenytoin (DILANTIN) 100 MG ER capsule Take 300 mg by mouth daily.       . potassium chloride (KLOR-CON) 10 MEQ CR tablet Take 10 mEq by mouth daily.        . promethazine (PHENERGAN) 25 MG tablet Take 25 mg by mouth every 6 (six) hours as needed.        . SUMAtriptan (IMITREX) 100 MG tablet Take 100 mg by mouth every 2 (two) hours as needed.        . tiotropium (SPIRIVA) 18 MCG inhalation capsule Place 18 mcg into inhaler and inhale daily.        . traZODone (DESYREL) 100 MG tablet Take 100 mg by mouth at bedtime.        Marland Kitchen  vitamin B-12 (CYANOCOBALAMIN) 1000 MCG tablet Take 1,000 mcg by mouth daily.         History   Social History Narrative  . No narrative on file   History   Social History  . Marital Status: Legally Separated    Spouse Name: N/A    Number of Children: N/A  . Years of Education: N/A   Occupational History  . Not on file.   Social History Main Topics  . Smoking status: Current Everyday Smoker -- 1.0 packs/day for 20 years    Types: Cigarettes  . Smokeless tobacco: Not on file  . Alcohol Use: No     No etoh since 5-6 yrs  . Drug Use: No  . Sexually Active: No   Other Topics Concern  . Not on file   Social History Narrative  . No narrative on file   Past Surgical History  Procedure Date  . Jejunostomy feeding tube 07/13/2010  . Pancreatic pseudocyst drainage 07/13/2010  . Eus 03/26/2009    [facility]  . Portacath placement 11/28/2008    [facility]  . Gastrostomy-jejeunostomy tube change/placement 12/11/2008    [facility]   Family Status  Relation Status Death Age  . Father Deceased   . Brother Alive     good health  . Child Alive     adopted, good health  No Known Allergies      Objective:   Physical Exam  Filed Vitals:    11/05/10 1149  BP: 104/60  Pulse: 72  Temp: 98 F (36.7 C)  Height: 5\' 7"  (1.702 m)  Weight: 145 lb 4.8 oz (65.908 kg)    Alert and oriented. Skin warm and dry. Oral mucosa is moist. Natural teeth in good condition. Sclera anicteric, conjunctivae is pink. Thyroid not enlarged. No cervical lymphadenopathy. Lungs clear. Heart regular rate and rhythm.  Abdomen is soft. Bowel sounds are positive. No hepatomegaly. No abdominal masses felt. No tenderness. Stool black and guaiac positive. No edema to lower extremities. Patient is alert and oriented.     Assessment:    Hematemesis. Patient appears stable at this time. H and H on 19th of this month was normal. PUD needs to be ruled out. In need of a screening colonoscopy.    Plan:    Will get a repeat H and H on him today . I discussed this case with Dr. Karilyn Cota. Will proceed with a EGD to rule out PUD.  Will also schedule a colonoscopy with Dr. Karilyn Cota.

## 2010-11-06 LAB — CBC WITH DIFFERENTIAL/PLATELET
Basophils Absolute: 0.1 10*3/uL (ref 0.0–0.1)
Basophils Relative: 0 % (ref 0–1)
Eosinophils Absolute: 0.3 10*3/uL (ref 0.0–0.7)
Eosinophils Relative: 2 % (ref 0–5)
MCH: 32.8 pg (ref 26.0–34.0)
MCV: 95.2 fL (ref 78.0–100.0)
Platelets: 244 10*3/uL (ref 150–400)
RDW: 13.7 % (ref 11.5–15.5)

## 2010-11-13 ENCOUNTER — Encounter (HOSPITAL_COMMUNITY): Payer: Self-pay | Admitting: Pharmacy Technician

## 2010-11-13 MED ORDER — SODIUM CHLORIDE 0.45 % IV SOLN: Freq: Once | INTRAVENOUS | Status: DC

## 2010-11-14 ENCOUNTER — Encounter (HOSPITAL_COMMUNITY): Admission: RE | Payer: Self-pay | Source: Ambulatory Visit

## 2010-11-14 ENCOUNTER — Ambulatory Visit (HOSPITAL_COMMUNITY): Admission: RE | Admit: 2010-11-14 | Payer: Medicaid Other | Source: Ambulatory Visit | Admitting: Internal Medicine

## 2010-11-14 ENCOUNTER — Ambulatory Visit: Admit: 2010-11-14 | Payer: Self-pay | Admitting: Internal Medicine

## 2010-11-14 SURGERY — COLONOSCOPY WITH ESOPHAGOGASTRODUODENOSCOPY (EGD)
Anesthesia: Moderate Sedation

## 2010-11-14 SURGERY — EGD (ESOPHAGOGASTRODUODENOSCOPY)
Anesthesia: Moderate Sedation

## 2010-11-19 ENCOUNTER — Telehealth (INDEPENDENT_AMBULATORY_CARE_PROVIDER_SITE_OTHER): Payer: Self-pay | Admitting: *Deleted

## 2010-11-19 ENCOUNTER — Encounter (INDEPENDENT_AMBULATORY_CARE_PROVIDER_SITE_OTHER): Payer: Self-pay | Admitting: *Deleted

## 2010-11-19 NOTE — Telephone Encounter (Signed)
Lab order faxed.

## 2010-11-19 NOTE — Telephone Encounter (Signed)
Message copied by Shona Needles on Wed Nov 19, 2010  8:28 AM ------      Message from: Shona Needles      Created: Wed Aug 13, 2010  3:56 PM       Reminder that Labs needs to be drawn 11-13-10

## 2010-11-19 NOTE — Telephone Encounter (Signed)
Message copied by Shona Needles on Wed Nov 19, 2010  8:35 AM ------      Message from: Shona Needles      Created: Wed Aug 13, 2010  3:56 PM       Reminder that Labs needs to be drawn 11-13-10

## 2010-12-15 ENCOUNTER — Other Ambulatory Visit (INDEPENDENT_AMBULATORY_CARE_PROVIDER_SITE_OTHER): Payer: Self-pay | Admitting: *Deleted

## 2010-12-15 ENCOUNTER — Telehealth (INDEPENDENT_AMBULATORY_CARE_PROVIDER_SITE_OTHER): Payer: Self-pay | Admitting: *Deleted

## 2010-12-15 ENCOUNTER — Encounter (INDEPENDENT_AMBULATORY_CARE_PROVIDER_SITE_OTHER): Payer: Self-pay | Admitting: *Deleted

## 2010-12-15 DIAGNOSIS — K920 Hematemesis: Secondary | ICD-10-CM

## 2010-12-15 DIAGNOSIS — R1013 Epigastric pain: Secondary | ICD-10-CM

## 2010-12-15 DIAGNOSIS — D649 Anemia, unspecified: Secondary | ICD-10-CM

## 2010-12-15 NOTE — Telephone Encounter (Signed)
Patient states had blood work a couple of weeks ago and hasn't heard results, please call [phone]

## 2010-12-16 NOTE — Telephone Encounter (Signed)
Message left at home for patient to return call.

## 2011-01-01 ENCOUNTER — Telehealth (INDEPENDENT_AMBULATORY_CARE_PROVIDER_SITE_OTHER): Payer: Self-pay | Admitting: *Deleted

## 2011-01-01 NOTE — Telephone Encounter (Signed)
PCP/Requesting MD:  BLUTH  Name: Marcus Brooks  DOB: Jun 08, 2060    Procedure: TCS/EGD (patient was sch'd 11/2 & canceled)  Reason/Indication:  ANEMIA, EPIGASTRIC PAIN, HX HEMATEMESIS  Has patient had this procedure before?    If so, when, by whom and where?    Is there a family history of colon cancer?    Who?  What age when diagnosed?    Is patient diabetic?   no      Does patient have prosthetic heart valve?  no  Do you have a pacemaker?  no  Has patient had joint replacement within last 12 months?  no  Is patient on Coumadin, Plavix and/or Aspirin? no  Medications: Current outpatient prescriptions:albuterol (PROVENTIL HFA;VENTOLIN HFA) 108 (90 BASE) MCG/ACT inhaler, Inhale 2 puffs into the lungs every 6 (six) hours as needed. Shortness of breath , Disp: , Rfl: ;  amitriptyline (ELAVIL) 25 MG tablet, Take 25 mg by mouth at bedtime.  , Disp: , Rfl: ;  amLODipine (NORVASC) 10 MG tablet, Take 10 mg by mouth daily.  , Disp: , Rfl: ;  aspirin EC 81 MG tablet, Take 81 mg by mouth daily.  , Disp: , Rfl:  cholecalciferol (VITAMIN D) 1000 UNITS tablet, Take 1,000 Units by mouth daily.  , Disp: , Rfl: ;  cyanocobalamin (,VITAMIN B-12,) 1000 MCG/ML injection, Inject 1,000 mcg into the muscle every 30 (thirty) days.  , Disp: , Rfl: ;  diazepam (VALIUM) 10 MG tablet, Take 10 mg by mouth every 8 (eight) hours as needed. anxiety, Disp: , Rfl:  diphenhydrAMINE (BENADRYL) 25 mg capsule, Take 25 mg by mouth every 6 (six) hours as needed. itching , Disp: , Rfl: ;  fentaNYL (DURAGESIC - DOSED MCG/HR) 50 MCG/HR, Place 1 patch onto the skin every 3 (three) days.  , Disp: , Rfl: ;  fluticasone (FLONASE) 50 MCG/ACT nasal spray, Place 2 sprays into the nose daily as needed. allergies , Disp: , Rfl: ;  gabapentin (NEURONTIN) 300 MG capsule, Take 600 mg by mouth 2 (two) times daily.  , Disp: , Rfl:  HYDROcodone-acetaminophen (LORCET) 10-650 MG per tablet, Take 1 tablet by mouth every 6 (six) hours as needed. pain,  Disp: , Rfl: ;  ibuprofen (ADVIL,MOTRIN) 200 MG tablet, Take 800 mg by mouth every 8 (eight) hours as needed. pain , Disp: , Rfl: ;  loperamide (IMODIUM) 2 MG capsule, Take 2 mg by mouth 4 (four) times daily.  , Disp: , Rfl: ;  loratadine (CLARITIN) 10 MG tablet, Take 10 mg by mouth daily.  , Disp: , Rfl:  omeprazole (PRILOSEC) 20 MG capsule, Take 20 mg by mouth 2 (two) times daily.  , Disp: , Rfl: ;  phenytoin (DILANTIN) 100 MG ER capsule, Take 300 mg by mouth daily. , Disp: , Rfl: ;  potassium chloride (KLOR-CON) 10 MEQ CR tablet, Take 10 mEq by mouth daily.  , Disp: , Rfl: ;  promethazine (PHENERGAN) 25 MG tablet, Take 25 mg by mouth every 6 (six) hours as needed. nausea, Disp: , Rfl:  SUMAtriptan (IMITREX) 100 MG tablet, Take 100 mg by mouth every 2 (two) hours as needed. migraines, Disp: , Rfl: ;  tadalafil (CIALIS) 5 MG tablet, Take 5 mg by mouth daily as needed. For intercourse , Disp: , Rfl: ;  tiotropium (SPIRIVA) 18 MCG inhalation capsule, Place 18 mcg into inhaler and inhale daily.  , Disp: , Rfl: ;  traZODone (DESYREL) 100 MG tablet, Take 200 mg by mouth at bedtime. ,  Disp: , Rfl:   Allergies: nkda  Procedure date & time: 01/29/11 @ 1030

## 2011-01-02 NOTE — Telephone Encounter (Signed)
Patient aware to stop ASA 2 days prior

## 2011-01-02 NOTE — Telephone Encounter (Signed)
approved

## 2011-01-02 NOTE — Telephone Encounter (Signed)
Needs to hold ASA 

## 2011-01-21 ENCOUNTER — Encounter (HOSPITAL_COMMUNITY): Payer: Self-pay | Admitting: Pharmacy Technician

## 2011-01-21 ENCOUNTER — Telehealth (INDEPENDENT_AMBULATORY_CARE_PROVIDER_SITE_OTHER): Payer: Self-pay | Admitting: *Deleted

## 2011-01-21 NOTE — Telephone Encounter (Signed)
Patient needs half lytely, states pharmacy does not have

## 2011-01-22 MED ORDER — BISACODYL-PEG-KCL-NABICAR-NACL 5-210 MG-GM PO KIT: 1.0000 | PACK | Freq: Once | ORAL | Status: DC

## 2011-01-28 MED ORDER — SODIUM CHLORIDE 0.45 % IV SOLN
Freq: Once | INTRAVENOUS | Status: AC
  Administered 2011-01-29: 11:00:00 via INTRAVENOUS

## 2011-01-29 ENCOUNTER — Encounter (HOSPITAL_COMMUNITY): Payer: Self-pay | Admitting: *Deleted

## 2011-01-29 ENCOUNTER — Encounter (HOSPITAL_COMMUNITY)
Admission: RE | Disposition: A | Discharge status: Home / Self Care | Payer: Self-pay | Source: Ambulatory Visit | Attending: Internal Medicine

## 2011-01-29 ENCOUNTER — Ambulatory Visit (HOSPITAL_COMMUNITY)
Admission: RE | Admit: 2011-01-29 | Discharge: 2011-01-29 | Disposition: A | Discharge status: Home / Self Care | Payer: Medicaid Other | Source: Ambulatory Visit | Attending: Internal Medicine | Admitting: Internal Medicine

## 2011-01-29 ENCOUNTER — Other Ambulatory Visit (INDEPENDENT_AMBULATORY_CARE_PROVIDER_SITE_OTHER): Payer: Self-pay | Admitting: Internal Medicine

## 2011-01-29 DIAGNOSIS — D128 Benign neoplasm of rectum: Secondary | ICD-10-CM | POA: Insufficient documentation

## 2011-01-29 DIAGNOSIS — D649 Anemia, unspecified: Secondary | ICD-10-CM

## 2011-01-29 DIAGNOSIS — R1013 Epigastric pain: Secondary | ICD-10-CM

## 2011-01-29 DIAGNOSIS — Z79899 Other long term (current) drug therapy: Secondary | ICD-10-CM | POA: Insufficient documentation

## 2011-01-29 DIAGNOSIS — Z431 Encounter for attention to gastrostomy: Secondary | ICD-10-CM | POA: Insufficient documentation

## 2011-01-29 DIAGNOSIS — K449 Diaphragmatic hernia without obstruction or gangrene: Secondary | ICD-10-CM

## 2011-01-29 DIAGNOSIS — Z7982 Long term (current) use of aspirin: Secondary | ICD-10-CM | POA: Insufficient documentation

## 2011-01-29 DIAGNOSIS — J4489 Other specified chronic obstructive pulmonary disease: Secondary | ICD-10-CM | POA: Insufficient documentation

## 2011-01-29 DIAGNOSIS — Z1211 Encounter for screening for malignant neoplasm of colon: Secondary | ICD-10-CM

## 2011-01-29 DIAGNOSIS — D126 Benign neoplasm of colon, unspecified: Secondary | ICD-10-CM | POA: Insufficient documentation

## 2011-01-29 DIAGNOSIS — J449 Chronic obstructive pulmonary disease, unspecified: Secondary | ICD-10-CM | POA: Insufficient documentation

## 2011-01-29 DIAGNOSIS — K920 Hematemesis: Secondary | ICD-10-CM

## 2011-01-29 DIAGNOSIS — I10 Essential (primary) hypertension: Secondary | ICD-10-CM | POA: Insufficient documentation

## 2011-01-29 SURGERY — COLONOSCOPY WITH ESOPHAGOGASTRODUODENOSCOPY (EGD)
Anesthesia: Moderate Sedation

## 2011-01-29 MED ORDER — MIDAZOLAM HCL 5 MG/5ML IJ SOLN
INTRAMUSCULAR | Status: AC
  Filled 2011-01-29: qty 10

## 2011-01-29 MED ORDER — STERILE WATER FOR IRRIGATION IR SOLN
Status: DC | PRN
  Administered 2011-01-29: 11:00:00

## 2011-01-29 MED ORDER — BUTAMBEN-TETRACAINE-BENZOCAINE 2-2-14 % EX AERO
INHALATION_SPRAY | CUTANEOUS | Status: DC | PRN
  Administered 2011-01-29: 2 via TOPICAL

## 2011-01-29 MED ORDER — GLUCAGON HCL (RDNA) 1 MG IJ SOLR
INTRAMUSCULAR | Status: DC | PRN
  Administered 2011-01-29: .5 mg via INTRAVENOUS

## 2011-01-29 MED ORDER — MEPERIDINE HCL 50 MG/ML IJ SOLN
INTRAMUSCULAR | Status: DC | PRN
  Administered 2011-01-29 (×2): 25 mg via INTRAVENOUS

## 2011-01-29 MED ORDER — MEPERIDINE HCL 50 MG/ML IJ SOLN
INTRAMUSCULAR | Status: AC
  Filled 2011-01-29: qty 1

## 2011-01-29 MED ORDER — MIDAZOLAM HCL 5 MG/5ML IJ SOLN
INTRAMUSCULAR | Status: DC | PRN
  Administered 2011-01-29: 3 mg via INTRAVENOUS
  Administered 2011-01-29 (×3): 2 mg via INTRAVENOUS
  Administered 2011-01-29: 3 mg via INTRAVENOUS
  Administered 2011-01-29: 1 mg via INTRAVENOUS
  Administered 2011-01-29: 2 mg via INTRAVENOUS

## 2011-01-29 NOTE — Op Note (Signed)
EGD AND COLONOSCOPY  PROCEDURE REPORT  PATIENT:  Marcus Brooks  MR#:  [id number] Birthdate:  1960/10/27, 51 y.o., male Endoscopist:  Dr. Malissa Hippo, MD Referred By:  Dr. Ernestine Conrad, M.D. Procedure Date: 01/29/2011  Procedure:   EGD & Colonoscopy.  Indications:  Patient is 51 year old Caucasian male with history of upper GI bleed. He has a chronic epigastric pain felt to be pancreatitis. He is undergoing diagnostic EGD followed by an  screening colonoscopy            Informed Consent:  The risks, benefits, alternatives & imponderables which include, but are not limited to, bleeding, infection, perforation, drug reaction and potential missed lesion have been reviewed.  The potential for biopsy, lesion removal, esophageal dilation, etc. have also been discussed.  Questions have been answered.  All parties agreeable.  Please see history & physical in medical record for more information.  Medications:  Demerol 50 mg IV Versed 15 mg IV Glucagon 0.5 mg IV Cetacaine spray topically for oropharyngeal anesthesia  EGD  Description of procedure:  The endoscope was introduced through the mouth and advanced to the second portion of the duodenum without difficulty or limitations. The mucosal surfaces were surveyed very carefully during advancement of the scope and upon withdrawal.  Findings:  Esophagus:  Mucosa of the esophagus was normal. No varices were identified. GEJ:  36 cm Hiatus:  38 cm Stomach:  Stomach was empty and distended very well with insufflation. PEG/PEJ was in place. Mucosa at gastric body, antrum pyloric channel as well as annularis fundus and cardia was normal. Duodenum:  Normal bulbar and post bulbar mucosa. Tip of feeding tube was just past the angle of duodenum. It was caught with jumbo biopsy forceps and advanced for few centimeters and the second part of the duodenum.  Therapeutic/Diagnostic Maneuvers Performed:  See above  COLONOSCOPY Description of procedure:  After  a digital rectal exam was performed, that colonoscope was advanced from the anus through the rectum and colon to the area of the cecum, ileocecal valve and appendiceal orifice. The cecum was deeply intubated. These structures were well-seen and photographed for the record. From the level of the cecum and ileocecal valve, the scope was slowly and cautiously withdrawn. The mucosal surfaces were carefully surveyed utilizing scope tip to flexion to facilitate fold flattening as needed. The scope was pulled down into the rectum where a thorough exam including retroflexion was performed.  Findings:   Prep fair to satisfactory. Hyperperistaltic colon with lot of liquid stool. 6 mm polyp snared from proximal transverse colon. Another 5-6 mm polyp snared from mid sigmoid colon but was lost. Two small polyps were coagulated using snare tip one was at distal sigmoid colon and the second one was in rectum. Anorectal junction was unremarkable.   Therapeutic/Diagnostic Maneuvers Performed:  See above  Complications:  None  Cecal Withdrawal Time:  22 minutes  Impression:  Small sliding-type hernia otherwise normal EGD. PEG/PEJ in place. J-tube advanced into duodenum. Two  small polyps snared. One from transverse colon was retrieved with the second one from sigmoid colon was lost. Two small polyps were coagulated; one was at distal sigmoid colon and the second one was at rectum.  Recommendations:  No aspirin for 10 days. I will be contacting patient with results of biopsy and further recommendations.  [name]  01/29/2011 12:04 PM  CC: Dr. Ernestine Conrad, MD, MD & Dr. Bonnetta Barry ref. provider found

## 2011-01-29 NOTE — H&P (Signed)
Marcus Brooks is an 51 y.o. male.   Chief Complaint: Patient is here for EGD and colonoscopy. HPI: Patient is 51 year old Caucasian male with chronic pancreatitis with insufficiency who experienced large-volume hematemesis several weeks ago could not calm for GI evaluation. He has not had any more episodes. He denies heartburn or dysphagia. He has chronic abdominal pain. He had this tube placed over 2 years and he has gained 70 pounds since then. Denies diarrhea or rectal bleeding. Patient was adopted. Family history is not available.  Past Medical History  Diagnosis Date  . Pancreatitis chronic 08/04/2010  . GERD (gastroesophageal reflux disease)   . Insomnia 08/04/2010  . Seizure 08/04/2010  . Chronic pain syndrome 08/04/2010  . Unspecified essential hypertension 08/04/2010  . Chronic pancreatitis 08/04/2010  . Anxiety states 08/04/2010  . Chronic airway obstruction, not elsewhere classified 08/04/2010  . Tobacco use disorder 08/04/2010  . Other abnormal glucose 08/04/2010    Past Surgical History  Procedure Date  . Jejunostomy feeding tube 07/13/2010  . Pancreatic pseudocyst drainage 07/13/2010  . Eus 03/26/2009    [facility]  . Portacath placement 11/28/2008    [facility]  . Gastrostomy-jejeunostomy tube change/placement 12/11/2008    [facility]    History reviewed. No pertinent family history. Social History:  reports that he has been smoking Cigarettes.  He has a 20 pack-year smoking history. He does not have any smokeless tobacco history on file. He reports that he does not drink alcohol or use illicit drugs.  Allergies:  Allergies  Allergen Reactions  . Cheese     Hives    Medications Prior to Admission  Medication Dose Route Frequency Provider Last Rate Last Dose  . 0.45 % sodium chloride infusion   Intravenous Once Malissa Hippo, MD 20 mL/hr at 01/29/11 1041    . meperidine (DEMEROL) 50 MG/ML injection           . midazolam (VERSED) 5 MG/5ML injection            . simethicone susp in sterile water 1000 mL irrigation    PRN Malissa Hippo, MD       Medications Prior to Admission  Medication Sig Dispense Refill  . albuterol (PROVENTIL HFA;VENTOLIN HFA) 108 (90 BASE) MCG/ACT inhaler Inhale 2 puffs into the lungs every 6 (six) hours as needed. Shortness of breath       . amitriptyline (ELAVIL) 25 MG tablet Take 25 mg by mouth at bedtime.        Marland Kitchen amLODipine (NORVASC) 10 MG tablet Take 10 mg by mouth daily.        Marland Kitchen aspirin EC 81 MG tablet Take 81 mg by mouth daily.        . cholecalciferol (VITAMIN D) 1000 UNITS tablet Take 1,000 Units by mouth daily.        . diazepam (VALIUM) 10 MG tablet Take 10 mg by mouth every 8 (eight) hours as needed. anxiety      . diphenhydrAMINE (BENADRYL) 25 mg capsule Take 25 mg by mouth every 6 (six) hours as needed. itching       . fentaNYL (DURAGESIC - DOSED MCG/HR) 50 MCG/HR Place 1 patch onto the skin every 3 (three) days.        . fluticasone (FLONASE) 50 MCG/ACT nasal spray Place 2 sprays into the nose daily as needed. allergies       . gabapentin (NEURONTIN) 300 MG capsule Take 600 mg by mouth 2 (two) times daily.        Marland Kitchen  HYDROcodone-acetaminophen (LORCET) 10-650 MG per tablet Take 1 tablet by mouth every 6 (six) hours as needed. pain      . ibuprofen (ADVIL,MOTRIN) 200 MG tablet Take 800 mg by mouth every 8 (eight) hours as needed. pain       . loperamide (IMODIUM) 2 MG capsule Take 2 mg by mouth 4 (four) times daily.        Marland Kitchen loratadine (CLARITIN) 10 MG tablet Take 10 mg by mouth daily.        Marland Kitchen omeprazole (PRILOSEC) 20 MG capsule Take 20 mg by mouth 2 (two) times daily.        . phenytoin (DILANTIN) 100 MG ER capsule Take 300 mg by mouth daily.       . potassium chloride (KLOR-CON) 10 MEQ CR tablet Take 10 mEq by mouth daily.        . promethazine (PHENERGAN) 25 MG tablet Take 25 mg by mouth every 6 (six) hours as needed. nausea      . tadalafil (CIALIS) 5 MG tablet Take 5 mg by mouth daily as needed. For  intercourse       . tiotropium (SPIRIVA) 18 MCG inhalation capsule Place 18 mcg into inhaler and inhale daily.        . traZODone (DESYREL) 100 MG tablet Take 200 mg by mouth at bedtime.       . cyanocobalamin (,VITAMIN B-12,) 1000 MCG/ML injection Inject 1,000 mcg into the muscle every 30 (thirty) days.        . SUMAtriptan (IMITREX) 100 MG tablet Take 100 mg by mouth every 2 (two) hours as needed. migraines        No results found for this or any previous visit (from the past 48 hour(s)). No results found.  Review of Systems  Constitutional: Negative for weight loss.  Gastrointestinal: Negative for diarrhea and constipation.    Blood pressure 160/90, pulse 85, temperature 97.7 F (36.5 C), temperature source Oral, resp. rate 18, height 5\' 7"  (1.702 m), weight 145 lb (65.772 kg), SpO2 96.00%. Physical Exam  Constitutional: He is oriented to person, place, and time. He appears well-developed and well-nourished.  HENT:  Mouth/Throat: Oropharynx is clear and moist.  Eyes: Conjunctivae are normal. No scleral icterus.  Neck: No thyromegaly present.  GI: Soft. He exhibits no mass. There is Tenderness: generalized tenderness which is chronic.Marland Kitchen       He has PEG/PEJ in place. There is erythema the skin around tube site.  Musculoskeletal: He exhibits no edema.  Lymphadenopathy:    He has no cervical adenopathy.  Neurological: He is alert and oriented to person, place, and time.  Skin: Skin is warm and dry.     Assessment/Plan History of upper GI bleed. Patient undergoing diagnostic EGD and screening colonoscopy.  Marcus Brooks 01/29/2011, 11:10 AM

## 2011-02-02 ENCOUNTER — Encounter (INDEPENDENT_AMBULATORY_CARE_PROVIDER_SITE_OTHER): Payer: Self-pay | Admitting: *Deleted

## 2011-03-09 ENCOUNTER — Encounter (INDEPENDENT_AMBULATORY_CARE_PROVIDER_SITE_OTHER): Payer: Self-pay | Admitting: Internal Medicine

## 2011-03-09 ENCOUNTER — Ambulatory Visit (INDEPENDENT_AMBULATORY_CARE_PROVIDER_SITE_OTHER): Payer: Medicaid Other | Admitting: Internal Medicine

## 2011-03-09 VITALS — BP 142/80 | HR 80 | Temp 98.3°F | Ht 68.0 in | Wt 148.3 lb

## 2011-03-09 DIAGNOSIS — B49 Unspecified mycosis: Secondary | ICD-10-CM

## 2011-03-09 DIAGNOSIS — T85848A Pain due to other internal prosthetic devices, implants and grafts, initial encounter: Secondary | ICD-10-CM

## 2011-03-09 DIAGNOSIS — T85898A Other specified complication of other internal prosthetic devices, implants and grafts, initial encounter: Secondary | ICD-10-CM

## 2011-03-09 DIAGNOSIS — B379 Candidiasis, unspecified: Secondary | ICD-10-CM

## 2011-03-09 MED ORDER — CLOTRIMAZOLE-BETAMETHASONE 1-0.05 % EX CREA: TOPICAL_CREAM | Freq: Two times a day (BID) | CUTANEOUS | Status: AC

## 2011-03-09 NOTE — Progress Notes (Signed)
Subjective:     Patient ID: Marcus Brooks, male   DOB: [birth year], 51 y.o.   MRN: [id number]  HPI Marcus Brooks presents today with c/o that he has redness around his J. Tube. Redness for a couple of weeks.He also had a milky discharge.  He started Clotrimazole and Betamethasone Dipropionate Cream !% since Friday.  Hx of chronic pancreatitis.  Appetite is  Okay. He eats once a day with supplemental feedings via J. Tube. No weight loss. He has BM about 1 a day.  No fever. Slight tenderness  J tube insertion. No melena or bright red rectal bleeding.  Colonoscopy/EGD 01/29/11 Dr. Karilyn Cota: Impression:  Small sliding-type hernia otherwise normal EGD.  PEG/PEJ in place. J-tube advanced into duodenum.  Two small polyps snared. One from transverse colon was retrieved with the second one from sigmoid colon was lost.  Two small polyps were coagulated; one was at distal sigmoid colon and the second one was at rectum.      Review of Systems see hpi     Objective:   Physical Exam J Tube insertion site. Slight redness around J. Tube which girlfriend states looks much better.. Small amt of drainage.  Alert and oriented. Skin warm and dry. Oral mucosa is moist.   . Sclera anicteric, conjunctivae is pink. Thyroid not enlarged. No cervical lymphadenopathy. Lungs clear. Heart regular rate and rhythm.  Abdomen is soft. Bowel sounds are positive. No hepatomegaly. No abdominal masses felt. No tenderness.  No edema to lower extremities. Patient is alert and oriented.     Assessment:    Possible yeast infection around J. Tube.     Plan:     Continue the Clotrimazole and Betamethasone Dipropionate Cream 1% twice a day.  Apply dressing to area

## 2011-03-09 NOTE — Patient Instructions (Signed)
Change dressing twice a day. Apply ointment twice a day.

## 2011-04-30 ENCOUNTER — Encounter (INDEPENDENT_AMBULATORY_CARE_PROVIDER_SITE_OTHER): Payer: Self-pay

## 2011-05-01 ENCOUNTER — Encounter (INDEPENDENT_AMBULATORY_CARE_PROVIDER_SITE_OTHER): Payer: Self-pay

## 2012-08-25 DIAGNOSIS — K920 Hematemesis: Secondary | ICD-10-CM

## 2012-09-01 ENCOUNTER — Encounter (INDEPENDENT_AMBULATORY_CARE_PROVIDER_SITE_OTHER): Payer: Self-pay

## 2012-09-12 ENCOUNTER — Encounter (HOSPITAL_COMMUNITY): Payer: Self-pay | Admitting: Emergency Medicine

## 2012-09-12 ENCOUNTER — Emergency Department (HOSPITAL_COMMUNITY): Payer: Medicaid Other

## 2012-09-12 ENCOUNTER — Encounter (HOSPITAL_COMMUNITY): Payer: Self-pay | Admitting: Anesthesiology

## 2012-09-12 ENCOUNTER — Encounter (HOSPITAL_COMMUNITY)
Admission: EM | Disposition: A | Discharge status: Home / Self Care | Payer: Self-pay | Source: Home / Self Care | Attending: Emergency Medicine

## 2012-09-12 ENCOUNTER — Observation Stay (HOSPITAL_COMMUNITY)
Admission: EM | Admit: 2012-09-12 | Discharge: 2012-09-13 | Disposition: A | Discharge status: Home / Self Care | Payer: Medicaid Other | Attending: Internal Medicine | Admitting: Internal Medicine

## 2012-09-12 ENCOUNTER — Emergency Department (HOSPITAL_COMMUNITY): Payer: Medicaid Other | Admitting: Anesthesiology

## 2012-09-12 DIAGNOSIS — T85898A Other specified complication of other internal prosthetic devices, implants and grafts, initial encounter: Secondary | ICD-10-CM

## 2012-09-12 DIAGNOSIS — Z860100 Personal history of colon polyps, unspecified: Secondary | ICD-10-CM | POA: Insufficient documentation

## 2012-09-12 DIAGNOSIS — J449 Chronic obstructive pulmonary disease, unspecified: Secondary | ICD-10-CM | POA: Insufficient documentation

## 2012-09-12 DIAGNOSIS — S65509A Unspecified injury of blood vessel of unspecified finger, initial encounter: Secondary | ICD-10-CM | POA: Insufficient documentation

## 2012-09-12 DIAGNOSIS — G4700 Insomnia, unspecified: Secondary | ICD-10-CM | POA: Insufficient documentation

## 2012-09-12 DIAGNOSIS — F411 Generalized anxiety disorder: Secondary | ICD-10-CM | POA: Insufficient documentation

## 2012-09-12 DIAGNOSIS — I10 Essential (primary) hypertension: Secondary | ICD-10-CM

## 2012-09-12 DIAGNOSIS — K219 Gastro-esophageal reflux disease without esophagitis: Secondary | ICD-10-CM

## 2012-09-12 DIAGNOSIS — F17200 Nicotine dependence, unspecified, uncomplicated: Secondary | ICD-10-CM | POA: Insufficient documentation

## 2012-09-12 DIAGNOSIS — IMO0002 Reserved for concepts with insufficient information to code with codable children: Secondary | ICD-10-CM | POA: Insufficient documentation

## 2012-09-12 DIAGNOSIS — K861 Other chronic pancreatitis: Secondary | ICD-10-CM

## 2012-09-12 DIAGNOSIS — S6710XA Crushing injury of unspecified finger(s), initial encounter: Principal | ICD-10-CM | POA: Insufficient documentation

## 2012-09-12 DIAGNOSIS — G894 Chronic pain syndrome: Secondary | ICD-10-CM | POA: Insufficient documentation

## 2012-09-12 DIAGNOSIS — T85848A Pain due to other internal prosthetic devices, implants and grafts, initial encounter: Secondary | ICD-10-CM

## 2012-09-12 DIAGNOSIS — J4489 Other specified chronic obstructive pulmonary disease: Secondary | ICD-10-CM | POA: Insufficient documentation

## 2012-09-12 HISTORY — PX: I & D EXTREMITY: SHX5045

## 2012-09-12 HISTORY — PX: NERVE AND TENDON REPAIR: SHX5693

## 2012-09-12 HISTORY — PX: OPEN REDUCTION INTERNAL FIXATION (ORIF) METACARPAL: SHX6234

## 2012-09-12 LAB — CBC WITH DIFFERENTIAL/PLATELET
Basophils Relative: 0 % (ref 0–1)
Eosinophils Absolute: 0.2 10*3/uL (ref 0.0–0.7)
Hemoglobin: 13.6 g/dL (ref 13.0–17.0)
MCH: 33.9 pg (ref 26.0–34.0)
MCHC: 36.4 g/dL — ABNORMAL HIGH (ref 30.0–36.0)
Monocytes Relative: 6 % (ref 3–12)
Neutrophils Relative %: 79 % — ABNORMAL HIGH (ref 43–77)

## 2012-09-12 LAB — BASIC METABOLIC PANEL
CO2: 22 mEq/L (ref 19–32)
Chloride: 104 mEq/L (ref 96–112)
Creatinine, Ser: 0.66 mg/dL (ref 0.50–1.35)
Glucose, Bld: 99 mg/dL (ref 70–99)

## 2012-09-12 LAB — HEPATIC FUNCTION PANEL
ALT: 14 U/L (ref 0–53)
Alkaline Phosphatase: 91 U/L (ref 39–117)
Total Bilirubin: 0.4 mg/dL (ref 0.3–1.2)
Total Protein: 8 g/dL (ref 6.0–8.3)

## 2012-09-12 SURGERY — IRRIGATION AND DEBRIDEMENT EXTREMITY
Anesthesia: General | Site: Finger | Laterality: Left | Wound class: Dirty or Infected

## 2012-09-12 MED ORDER — HYDROMORPHONE HCL PF 1 MG/ML IJ SOLN: 0.2500 mg | INTRAMUSCULAR | Status: DC | PRN

## 2012-09-12 MED ORDER — CEFAZOLIN SODIUM 1-5 GM-% IV SOLN
1.0000 g | Freq: Once | INTRAVENOUS | Status: AC
  Administered 2012-09-12: 1 g via INTRAVENOUS
  Filled 2012-09-12: qty 50

## 2012-09-12 MED ORDER — ONDANSETRON HCL 4 MG/2ML IJ SOLN
4.0000 mg | Freq: Once | INTRAMUSCULAR | Status: DC
  Filled 2012-09-12: qty 2

## 2012-09-12 MED ORDER — SODIUM CHLORIDE 0.9 % IV SOLN
INTRAVENOUS | Status: DC | PRN
  Administered 2012-09-12: 22:00:00 via INTRAVENOUS

## 2012-09-12 MED ORDER — BUPIVACAINE HCL (PF) 0.25 % IJ SOLN
INTRAMUSCULAR | Status: DC | PRN
  Administered 2012-09-12: 6 mL

## 2012-09-12 MED ORDER — FENTANYL CITRATE 0.05 MG/ML IJ SOLN
INTRAMUSCULAR | Status: DC | PRN
  Administered 2012-09-12: 100 ug via INTRAVENOUS
  Administered 2012-09-12: 150 ug via INTRAVENOUS

## 2012-09-12 MED ORDER — ONDANSETRON HCL 4 MG/2ML IJ SOLN: 4.0000 mg | Freq: Four times a day (QID) | INTRAMUSCULAR | Status: DC | PRN

## 2012-09-12 MED ORDER — ONDANSETRON HCL 4 MG/2ML IJ SOLN
INTRAMUSCULAR | Status: DC | PRN
  Administered 2012-09-12: 4 mg via INTRAVENOUS

## 2012-09-12 MED ORDER — HYDROMORPHONE HCL PF 1 MG/ML IJ SOLN
1.0000 mg | Freq: Once | INTRAMUSCULAR | Status: AC
  Administered 2012-09-12: 1 mg via INTRAVENOUS
  Filled 2012-09-12: qty 1

## 2012-09-12 MED ORDER — LACTATED RINGERS IV SOLN: INTRAVENOUS | Status: DC

## 2012-09-12 MED ORDER — ENSURE COMPLETE PO LIQD
237.0000 mL | Freq: Three times a day (TID) | ORAL | Status: DC
  Administered 2012-09-13 (×2): 237 mL

## 2012-09-12 MED ORDER — MORPHINE SULFATE 4 MG/ML IJ SOLN
4.0000 mg | Freq: Once | INTRAMUSCULAR | Status: DC
  Filled 2012-09-12: qty 1

## 2012-09-12 MED ORDER — ONDANSETRON 4 MG PO TBDP
4.0000 mg | ORAL_TABLET | Freq: Once | ORAL | Status: AC
  Administered 2012-09-12: 4 mg via ORAL
  Filled 2012-09-12: qty 1

## 2012-09-12 MED ORDER — TETANUS-DIPHTH-ACELL PERTUSSIS 5-2.5-18.5 LF-MCG/0.5 IM SUSP
0.5000 mL | Freq: Once | INTRAMUSCULAR | Status: AC
  Administered 2012-09-12: 0.5 mL via INTRAMUSCULAR
  Filled 2012-09-12: qty 0.5

## 2012-09-12 MED ORDER — PROPOFOL 10 MG/ML IV BOLUS
INTRAVENOUS | Status: DC | PRN
  Administered 2012-09-12: 120 mg via INTRAVENOUS

## 2012-09-12 MED ORDER — SUCCINYLCHOLINE CHLORIDE 20 MG/ML IJ SOLN
INTRAMUSCULAR | Status: DC | PRN
  Administered 2012-09-12: 100 mg via INTRAVENOUS

## 2012-09-12 MED ORDER — CEFAZOLIN SODIUM 1-5 GM-% IV SOLN
INTRAVENOUS | Status: DC | PRN
  Administered 2012-09-12: 1 g via INTRAVENOUS

## 2012-09-12 MED ORDER — SODIUM CHLORIDE 0.9 % IR SOLN
Status: DC | PRN
  Administered 2012-09-12: 1000 mL

## 2012-09-12 MED ORDER — MIDAZOLAM HCL 5 MG/5ML IJ SOLN
INTRAMUSCULAR | Status: DC | PRN
  Administered 2012-09-12 (×2): 2 mg via INTRAVENOUS

## 2012-09-12 MED ORDER — HYDROMORPHONE HCL PF 1 MG/ML IJ SOLN
1.0000 mg | INTRAMUSCULAR | Status: DC | PRN
  Administered 2012-09-13 (×3): 1 mg via INTRAVENOUS
  Filled 2012-09-12 (×3): qty 1

## 2012-09-12 MED ORDER — LIDOCAINE HCL (CARDIAC) 20 MG/ML IV SOLN
INTRAVENOUS | Status: DC | PRN
  Administered 2012-09-12: 30 mg via INTRAVENOUS

## 2012-09-12 MED ORDER — MORPHINE SULFATE 4 MG/ML IJ SOLN
4.0000 mg | Freq: Once | INTRAMUSCULAR | Status: AC
  Administered 2012-09-12: 4 mg via INTRAMUSCULAR

## 2012-09-12 MED ORDER — OXYCODONE HCL 5 MG PO TABS: 5.0000 mg | ORAL_TABLET | Freq: Once | ORAL | Status: DC | PRN

## 2012-09-12 MED ORDER — OXYCODONE HCL 5 MG/5ML PO SOLN: 5.0000 mg | Freq: Once | ORAL | Status: DC | PRN

## 2012-09-12 SURGICAL SUPPLY — 46 items
BANDAGE CONFORM 2  STR LF (GAUZE/BANDAGES/DRESSINGS) IMPLANT
BANDAGE ELASTIC 3 VELCRO ST LF (GAUZE/BANDAGES/DRESSINGS) ×1 IMPLANT
BANDAGE ELASTIC 4 VELCRO ST LF (GAUZE/BANDAGES/DRESSINGS) ×1 IMPLANT
BANDAGE GAUZE ELAST BULKY 4 IN (GAUZE/BANDAGES/DRESSINGS) ×2 IMPLANT
CLOTH BEACON ORANGE TIMEOUT ST (SAFETY) ×2 IMPLANT
CORDS BIPOLAR (ELECTRODE) ×2 IMPLANT
CUFF TOURNIQUET SINGLE 18IN (TOURNIQUET CUFF) ×2 IMPLANT
CUFF TOURNIQUET SINGLE 24IN (TOURNIQUET CUFF) IMPLANT
DRSG ADAPTIC 3X8 NADH LF (GAUZE/BANDAGES/DRESSINGS) ×1 IMPLANT
DRSG EMULSION OIL 3X3 NADH (GAUZE/BANDAGES/DRESSINGS) ×1 IMPLANT
ELECT REM PT RETURN 9FT ADLT (ELECTROSURGICAL)
ELECTRODE REM PT RTRN 9FT ADLT (ELECTROSURGICAL) IMPLANT
GAUZE XEROFORM 1X8 LF (GAUZE/BANDAGES/DRESSINGS) ×2 IMPLANT
GLOVE BIOGEL M STRL SZ7.5 (GLOVE) ×1 IMPLANT
GLOVE SS BIOGEL STRL SZ 8 (GLOVE) ×1 IMPLANT
GLOVE SUPERSENSE BIOGEL SZ 8 (GLOVE) ×1
GOWN PREVENTION PLUS XLARGE (GOWN DISPOSABLE) ×2 IMPLANT
GOWN STRL NON-REIN LRG LVL3 (GOWN DISPOSABLE) ×4 IMPLANT
GOWN STRL REIN XL XLG (GOWN DISPOSABLE) ×4 IMPLANT
HANDPIECE INTERPULSE COAX TIP (DISPOSABLE)
K-WIRE 3.2 (WIRE) ×2 IMPLANT
KIT BASIN OR (CUSTOM PROCEDURE TRAY) ×2 IMPLANT
KIT ROOM TURNOVER OR (KITS) ×2 IMPLANT
MANIFOLD NEPTUNE II (INSTRUMENTS) ×2 IMPLANT
NDL HYPO 25GX1X1/2 BEV (NEEDLE) IMPLANT
NEEDLE HYPO 25GX1X1/2 BEV (NEEDLE) IMPLANT
NS IRRIG 1000ML POUR BTL (IV SOLUTION) ×2 IMPLANT
PACK ORTHO EXTREMITY (CUSTOM PROCEDURE TRAY) ×2 IMPLANT
PAD ARMBOARD 7.5X6 YLW CONV (MISCELLANEOUS) ×4 IMPLANT
PAD CAST 4YDX4 CTTN HI CHSV (CAST SUPPLIES) ×1 IMPLANT
PADDING CAST COTTON 4X4 STRL (CAST SUPPLIES) ×2
PUTTY DBM STAGRAFT 2CC (Putty) ×1 IMPLANT
SET HNDPC FAN SPRY TIP SCT (DISPOSABLE) IMPLANT
SPONGE GAUZE 4X4 12PLY (GAUZE/BANDAGES/DRESSINGS) ×2 IMPLANT
SPONGE LAP 18X18 X RAY DECT (DISPOSABLE) ×1 IMPLANT
SPONGE LAP 4X18 X RAY DECT (DISPOSABLE) ×2 IMPLANT
SUT CHROMIC 5 0 P 3 (SUTURE) ×1 IMPLANT
SUT ETHILON 8 0 BV130 4 (SUTURE) ×1 IMPLANT
SUT VIC AB 3-0 FS2 27 (SUTURE) ×1 IMPLANT
SYR CONTROL 10ML LL (SYRINGE) IMPLANT
TOWEL OR 17X24 6PK STRL BLUE (TOWEL DISPOSABLE) ×2 IMPLANT
TOWEL OR 17X26 10 PK STRL BLUE (TOWEL DISPOSABLE) ×2 IMPLANT
TUBE ANAEROBIC SPECIMEN COL (MISCELLANEOUS) IMPLANT
TUBE CONNECTING 12X1/4 (SUCTIONS) ×2 IMPLANT
WATER STERILE IRR 1000ML POUR (IV SOLUTION) ×2 IMPLANT
YANKAUER SUCT BULB TIP NO VENT (SUCTIONS) ×2 IMPLANT

## 2012-09-12 NOTE — ED Provider Notes (Signed)
CSN: [id number]     Arrival date & time 09/12/12  1823 History   First MD Initiated Contact with Patient 09/12/12 1824     Chief Complaint  Patient presents with  . Finger Injury   (Consider location/radiation/quality/duration/timing/severity/associated sxs/prior Treatment) HPI  Marcus Brooks is a 52 y.o. male in by EMS complaining of pain laceration and deformity to left fifth digit DIP. Patient is right-hand dominant. Patient was working on his car and got his finger caught in between the door and a fence post. Pain is severe, 10 out of 10, exacerbated by movement and palpation. Patient is confused about his last tetanus shot, states he is given a tetanus shot yearly by his primary care physician. Denies numbness, weakness, other injuries.   Past Medical History  Diagnosis Date  . Pancreatitis chronic 08/04/2010  . GERD (gastroesophageal reflux disease)   . Insomnia 08/04/2010  . Seizure 08/04/2010  . Chronic pain syndrome 08/04/2010  . Unspecified essential hypertension 08/04/2010  . Chronic pancreatitis 08/04/2010  . Anxiety states 08/04/2010  . Chronic airway obstruction, not elsewhere classified 08/04/2010  . Tobacco use disorder 08/04/2010  . Other abnormal glucose 08/04/2010   Past Surgical History  Procedure Laterality Date  . Jejunostomy feeding tube  07/13/2010  . Pancreatic pseudocyst drainage  07/13/2010  . Eus  03/26/2009    [facility]  . Portacath placement  11/28/2008    [facility]  . Gastrostomy-jejeunostomy tube change/placement  12/11/2008    [facility]   No family history on file. History  Substance Use Topics  . Smoking status: Current Every Day Smoker -- 1.00 packs/day for 20 years    Types: Cigarettes  . Smokeless tobacco: Not on file     Comment: 1 pack a day since age 44  . Alcohol Use: No     Comment: No etoh since 5-6 yrs    Review of Systems 10 systems reviewed and found to be negative, except as noted in the HPI  Allergies   Cheese  Home Medications   Current Outpatient Rx  Name  Route  Sig  Dispense  Refill  . albuterol (PROVENTIL HFA;VENTOLIN HFA) 108 (90 BASE) MCG/ACT inhaler   Inhalation   Inhale 2 puffs into the lungs every 6 (six) hours as needed. Shortness of breath          . amitriptyline (ELAVIL) 25 MG tablet   Oral   Take 25 mg by mouth at bedtime.           Marland Kitchen amLODipine (NORVASC) 10 MG tablet   Oral   Take 10 mg by mouth daily.           . Bisacodyl-PEG-KCl-NaBicar-NaCl (HALFLYTELY WITH FLAVOR PACKS) 5-210 MG-GM kit   Oral   Take 1 kit by mouth once.   1 each   0   . cholecalciferol (VITAMIN D) 1000 UNITS tablet   Oral   Take 1,000 Units by mouth daily.           . cyanocobalamin (,VITAMIN B-12,) 1000 MCG/ML injection   Intramuscular   Inject 1,000 mcg into the muscle every 30 (thirty) days.           . diazepam (VALIUM) 10 MG tablet   Oral   Take 10 mg by mouth every 8 (eight) hours as needed. anxiety         . diphenhydrAMINE (BENADRYL) 25 mg capsule   Oral   Take 25 mg by mouth every 6 (six) hours as needed.  itching          . fentaNYL (DURAGESIC - DOSED MCG/HR) 50 MCG/HR   Transdermal   Place 1 patch onto the skin every 3 (three) days.           . fluticasone (FLONASE) 50 MCG/ACT nasal spray   Nasal   Place 2 sprays into the nose daily as needed. allergies          . gabapentin (NEURONTIN) 300 MG capsule   Oral   Take 600 mg by mouth 2 (two) times daily.           Marland Kitchen HYDROcodone-acetaminophen (LORCET) 10-650 MG per tablet   Oral   Take 1 tablet by mouth every 6 (six) hours as needed. pain         . loperamide (IMODIUM) 2 MG capsule   Oral   Take 2 mg by mouth 4 (four) times daily.           Marland Kitchen loratadine (CLARITIN) 10 MG tablet   Oral   Take 10 mg by mouth daily.           Marland Kitchen omeprazole (PRILOSEC) 20 MG capsule   Oral   Take 20 mg by mouth 2 (two) times daily.           . phenytoin (DILANTIN) 100 MG ER capsule   Oral   Take  300 mg by mouth daily.          . potassium chloride (KLOR-CON) 10 MEQ CR tablet   Oral   Take 10 mEq by mouth daily.           . promethazine (PHENERGAN) 25 MG tablet   Oral   Take 25 mg by mouth every 6 (six) hours as needed. nausea         . SUMAtriptan (IMITREX) 100 MG tablet   Oral   Take 100 mg by mouth every 2 (two) hours as needed. migraines         . tadalafil (CIALIS) 5 MG tablet   Oral   Take 5 mg by mouth daily as needed. For intercourse          . tiotropium (SPIRIVA) 18 MCG inhalation capsule   Inhalation   Place 18 mcg into inhaler and inhale daily.           . traZODone (DESYREL) 100 MG tablet   Oral   Take 200 mg by mouth at bedtime.           BP 138/75  Pulse 85  Temp(Src) 98.3 F (36.8 C) (Oral)  SpO2 93% Physical Exam  Nursing note and vitals reviewed. Constitutional: He is oriented to person, place, and time. He appears well-developed and well-nourished. No distress.  HENT:  Head: Normocephalic.  Mouth/Throat: Oropharynx is clear and moist.  Eyes: Conjunctivae and EOM are normal. Pupils are equal, round, and reactive to light.  Neck: Normal range of motion.  Cardiovascular: Normal rate, regular rhythm and intact distal pulses.   Pulmonary/Chest: Effort normal and breath sounds normal. No stridor.  Port to left anterior chest  Abdominal:  G-tube in place  Musculoskeletal: Normal range of motion.  Deformity and laceration to left fifth digit DIP on the medial volar side. Bleeding well controlled, distal sensation intact.   Neurological: He is alert and oriented to person, place, and time.  Psychiatric: He has a normal mood and affect.    ED Course  NERVE BLOCK Date/Time: 09/12/2012 7:14 PM Performed by: Wynetta Emery Authorized  by: Marselino Slayton Consent: Verbal consent obtained. Risks and benefits: risks, benefits and alternatives were discussed Consent given by: patient Indications: pain relief Body area: upper  extremity Nerve: digital Laterality: left Patient position: sitting Needle gauge: 27 G Location technique: anatomical landmarks Local anesthetic: lidocaine 2% without epinephrine Anesthetic total: 5 ml Outcome: pain improved Patient tolerance: Patient tolerated the procedure well with no immediate complications.   (including critical care time) Labs Review Labs Reviewed - No data to display Imaging Review No results found.  MDM  Diagnoses: Open fracture left fifth digit  Filed Vitals:   09/12/12 1838  BP: 138/75  Pulse: 85  Temp: 98.3 F (36.8 C)  TempSrc: Oral  SpO2: 93%     Marcus Brooks is a 52 y.o. male with laceration and deformity to left fifth digit DIP, patient is right-hand dominant. Tetanus updated, digital block given and left hand and arm cleaned before patient went to x-ray. Laying film shows an acute comminuted open fracture of the left fifth digit middle phalanx.  Hand surgery consult from Dr. Amanda Pea appreciated: He will take the patient to the OR for repair, presurgical labs and EKG ordered. Patient has been n.p.o. all day.   Medications  TDaP (BOOSTRIX) injection 0.5 mL (not administered)  ondansetron (ZOFRAN-ODT) disintegrating tablet 4 mg (not administered)  morphine 4 MG/ML injection 4 mg (4 mg Intramuscular Given 09/12/12 1848)   Note: Portions of this report may have been transcribed using voice recognition software. Every effort was made to ensure accuracy; however, inadvertent computerized transcription errors may be present      Wynetta Emery, PA-C 09/15/12 [phone]

## 2012-09-12 NOTE — Transfer of Care (Signed)
Immediate Anesthesia Transfer of Care Note  Patient: Marcus Brooks  Procedure(s) Performed: Procedure(s): IRRIGATION AND DEBRIDEMENT LEFT FIFTH FINGER (Left) OPEN REDUCTION INTERNAL FIXATION LEFT FIFTH FINGER (Left) NERVE AND TENDON REPAIR LEFT FIFTH FINGER (Left)  Patient Location: PACU  Anesthesia Type:General  Level of Consciousness: awake, alert , oriented and patient cooperative  Airway & Oxygen Therapy: Patient Spontanous Breathing  Post-op Assessment: Post -op Vital signs reviewed and stable and Patient moving all extremities  Post vital signs: Reviewed and stable  Complications: No apparent anesthesia complications
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
## 2012-09-12 NOTE — Op Note (Signed)
Dictation #[id number] Dominica Severin MD

## 2012-09-12 NOTE — ED Notes (Signed)
Pt to ED via [facility] for evaluation of injury to left pinky finger.  Pt got his finger caught between a truck door and a fence post- obvious open deformity noted to finger- bleeding controlled at present.  Alert and oriented X 4.

## 2012-09-12 NOTE — Anesthesia Preprocedure Evaluation (Addendum)
Anesthesia Evaluation  Patient identified by MRN, date of birth, ID band Patient awake    Reviewed: Allergy & Precautions, H&P , NPO status , Patient's Chart, lab work & pertinent test results  History of Anesthesia Complications Negative for: history of anesthetic complications  Airway Mallampati: II  Neck ROM: full    Dental   Pulmonary COPD COPD inhaler, Current Smoker,          Cardiovascular Exercise Tolerance: Poor hypertension, Pt. on medications     Neuro/Psych Seizures -, Well Controlled,  PSYCHIATRIC DISORDERS Anxiety    GI/Hepatic GERD-  Medicated and Poorly Controlled,Chronic pancreatitis  gtube    Endo/Other    Renal/GU      Musculoskeletal   Abdominal   Peds  Hematology  (+) Blood dyscrasia, anemia ,   Anesthesia Other Findings Chronic pain and daily benzodiazepines   Reproductive/Obstetrics                         Anesthesia Physical Anesthesia Plan  ASA: III  Anesthesia Plan: General   Post-op Pain Management:    Induction: Intravenous  Airway Management Planned: Oral ETT  Additional Equipment:   Intra-op Plan:   Post-operative Plan: Extubation in OR  Informed Consent:   Plan Discussed with:   Anesthesia Plan Comments:         Anesthesia Quick Evaluation

## 2012-09-12 NOTE — ED Notes (Signed)
Pt presents after getting finger slammed between gate and truck door.
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
## 2012-09-12 NOTE — H&P (Signed)
Marcus Brooks is an 52 y.o. male.   Chief Complaint: left small finger open fx HPI: patient presents with a left small finger open fx at P2 with severe soft tissue injury.   Past Medical History  Diagnosis Date  . Pancreatitis chronic 08/04/2010  . GERD (gastroesophageal reflux disease)   . Insomnia 08/04/2010  . Seizure 08/04/2010  . Chronic pain syndrome 08/04/2010  . Unspecified essential hypertension 08/04/2010  . Chronic pancreatitis 08/04/2010  . Anxiety states 08/04/2010  . Chronic airway obstruction, not elsewhere classified 08/04/2010  . Tobacco use disorder 08/04/2010  . Other abnormal glucose 08/04/2010    Past Surgical History  Procedure Laterality Date  . Jejunostomy feeding tube  07/13/2010  . Pancreatic pseudocyst drainage  07/13/2010  . Eus  03/26/2009    [facility]  . Portacath placement  11/28/2008    [facility]  . Gastrostomy-jejeunostomy tube change/placement  12/11/2008    [facility]    No family history on file. Social History:  reports that he has been smoking Cigarettes.  He has a 20 pack-year smoking history. He does not have any smokeless tobacco history on file. He reports that he does not drink alcohol or use illicit drugs.  Allergies:  Allergies  Allergen Reactions  . Cheese Hives     (Not in a hospital admission)  No results found for this or any previous visit (from the past 48 hour(s)). Dg Finger Little Left  09/12/2012   *RADIOLOGY REPORT*  Clinical Data: Crush injury, deformity, trauma  LEFT LITTLE FINGER 2+V  Comparison: None.  Findings: Acute comminuted fracture of the left fifth digit middle phalanx.  Overlying soft tissue laceration.  Flexion deformity noted suspicious for extensor tendon injury.  No radiopaque foreign body.  IMPRESSION: Acute comminuted open appearing fracture of the left fifth digit middle phalanx with flexion deformity as described.   Original Report Authenticated By: Judie Petit. Miles Costain, M.D.    Review of Systems   Constitutional: Negative.   Respiratory: Negative.   Gastrointestinal: Negative.        G-tube   Genitourinary: Negative.   Musculoskeletal:       See xray and PE  Skin: Negative.   Neurological: Negative.   Psychiatric/Behavioral: Negative.     Blood pressure 151/79, pulse 79, temperature 99.4 F (37.4 C), temperature source Oral, resp. rate 18, SpO2 92.00%. Physical Exam open left small finger fx with severe soft tissue injury and deformity SP crush injury .Marland KitchenThe patient is alert and oriented in no acute distress the patient complains of pain in the affected upper extremity.  The patient is noted to have a normal HEENT exam.  Lung fields show equal chest expansion and no shortness of breath  abdomen exam is nontender without distention.  Lower extremity examination does not show any fracture dislocation or blood clot symptoms.  Pelvis is stable neck and back are stable and nontender  Assessment/Plan Open left small finger fx-rec I&D and ASAP repair as necessary .Marland KitchenWe are planning surgery for your upper extremity. The risk and benefits of surgery include risk of bleeding infection anesthesia damage to normal structures and failure of the surgery to accomplish its intended goals of relieving symptoms and restoring function with this in mind we'll going to proceed. I have specifically discussed with the patient the pre-and postoperative regime and the does and don'ts and risk and benefits in great detail. Risk and benefits of surgery also include risk of dystrophy chronic nerve pain failure of the healing process to go  onto completion and other inherent risks of surgery The relavent the pathophysiology of the disease/injury process, as well as the alternatives for treatment and postoperative course of action has been discussed in great detail with the patient who desires to proceed.  We will do everything in our power to help you (the patient) restore function to the upper extremity. Is a  pleasure to see this patient today.   Karen Chafe 09/12/2012, 8:42 PM

## 2012-09-13 ENCOUNTER — Encounter (HOSPITAL_COMMUNITY): Payer: Self-pay | Admitting: Orthopedic Surgery

## 2012-09-13 DIAGNOSIS — T85898A Other specified complication of other internal prosthetic devices, implants and grafts, initial encounter: Secondary | ICD-10-CM

## 2012-09-13 DIAGNOSIS — I10 Essential (primary) hypertension: Secondary | ICD-10-CM

## 2012-09-13 LAB — CBC WITH DIFFERENTIAL/PLATELET
Basophils Absolute: 0.1 10*3/uL (ref 0.0–0.1)
HCT: 36.4 % — ABNORMAL LOW (ref 39.0–52.0)
Lymphocytes Relative: 27 % (ref 12–46)
Monocytes Absolute: 0.9 10*3/uL (ref 0.1–1.0)
Neutro Abs: 7.1 10*3/uL (ref 1.7–7.7)
Platelets: 240 10*3/uL (ref 150–400)
RBC: 3.84 MIL/uL — ABNORMAL LOW (ref 4.22–5.81)
RDW: 13.7 % (ref 11.5–15.5)
WBC: 11.5 10*3/uL — ABNORMAL HIGH (ref 4.0–10.5)

## 2012-09-13 LAB — BASIC METABOLIC PANEL
Chloride: 106 mEq/L (ref 96–112)
Creatinine, Ser: 0.73 mg/dL (ref 0.50–1.35)
GFR calc Af Amer: >90 mL/min (ref >90)
Sodium: 137 mEq/L (ref 135–145)

## 2012-09-13 MED ORDER — VITAMIN C 500 MG PO TABS
1000.0000 mg | ORAL_TABLET | Freq: Every day | ORAL | Status: DC
  Administered 2012-09-13: 1000 mg via ORAL
  Filled 2012-09-13: qty 2

## 2012-09-13 MED ORDER — CEPHALEXIN 500 MG PO CAPS: 500.0000 mg | ORAL_CAPSULE | Freq: Four times a day (QID) | ORAL | Status: DC

## 2012-09-13 MED ORDER — POTASSIUM CHLORIDE CRYS ER 10 MEQ PO TBCR
10.0000 meq | EXTENDED_RELEASE_TABLET | Freq: Every day | ORAL | Status: DC
  Administered 2012-09-13: 10 meq via ORAL
  Filled 2012-09-13: qty 1

## 2012-09-13 MED ORDER — HEPARIN SOD (PORK) LOCK FLUSH 100 UNIT/ML IV SOLN
500.0000 [IU] | INTRAVENOUS | Status: AC | PRN
  Administered 2012-09-13: 500 [IU]

## 2012-09-13 MED ORDER — DIAZEPAM 5 MG PO TABS: 10.0000 mg | ORAL_TABLET | Freq: Three times a day (TID) | ORAL | Status: DC | PRN

## 2012-09-13 MED ORDER — PHENYTOIN SODIUM EXTENDED 100 MG PO CAPS
300.0000 mg | ORAL_CAPSULE | Freq: Every day | ORAL | Status: DC
  Administered 2012-09-13: 300 mg via ORAL
  Filled 2012-09-13: qty 3

## 2012-09-13 MED ORDER — AMLODIPINE BESYLATE 10 MG PO TABS
10.0000 mg | ORAL_TABLET | Freq: Every day | ORAL | Status: DC
  Administered 2012-09-13: 10 mg via ORAL
  Filled 2012-09-13: qty 1

## 2012-09-13 MED ORDER — GABAPENTIN 300 MG PO CAPS
600.0000 mg | ORAL_CAPSULE | Freq: Two times a day (BID) | ORAL | Status: DC
Start: 2012-09-13 — End: 2012-09-13
  Administered 2012-09-13 (×2): 600 mg via ORAL
  Filled 2012-09-13 (×3): qty 2

## 2012-09-13 MED ORDER — ALBUTEROL SULFATE HFA 108 (90 BASE) MCG/ACT IN AERS: 2.0000 | INHALATION_SPRAY | Freq: Four times a day (QID) | RESPIRATORY_TRACT | Status: DC | PRN

## 2012-09-13 MED ORDER — LORATADINE 10 MG PO TABS
10.0000 mg | ORAL_TABLET | Freq: Every day | ORAL | Status: DC
  Administered 2012-09-13: 10 mg via ORAL
  Filled 2012-09-13: qty 1

## 2012-09-13 MED ORDER — TRAZODONE HCL 100 MG PO TABS
200.0000 mg | ORAL_TABLET | Freq: Every day | ORAL | Status: DC
  Administered 2012-09-13: 200 mg via ORAL
  Filled 2012-09-13 (×2): qty 2

## 2012-09-13 MED ORDER — VITAMIN D3 25 MCG (1000 UNIT) PO TABS
1000.0000 [IU] | ORAL_TABLET | Freq: Every day | ORAL | Status: DC
  Administered 2012-09-13: 1000 [IU] via ORAL
  Filled 2012-09-13: qty 1

## 2012-09-13 MED ORDER — DOCUSATE SODIUM 100 MG PO CAPS
100.0000 mg | ORAL_CAPSULE | Freq: Two times a day (BID) | ORAL | Status: DC
  Administered 2012-09-13: 100 mg via ORAL
  Filled 2012-09-13 (×2): qty 1

## 2012-09-13 MED ORDER — CEFAZOLIN SODIUM 1-5 GM-% IV SOLN
1.0000 g | Freq: Three times a day (TID) | INTRAVENOUS | Status: DC
  Administered 2012-09-13 (×2): 1 g via INTRAVENOUS
  Filled 2012-09-13 (×4): qty 50

## 2012-09-13 MED ORDER — DIPHENHYDRAMINE HCL 25 MG PO CAPS: 25.0000 mg | ORAL_CAPSULE | Freq: Four times a day (QID) | ORAL | Status: DC | PRN

## 2012-09-13 MED ORDER — TIOTROPIUM BROMIDE MONOHYDRATE 18 MCG IN CAPS
18.0000 ug | ORAL_CAPSULE | Freq: Every day | RESPIRATORY_TRACT | Status: DC
  Administered 2012-09-13: 18 ug via RESPIRATORY_TRACT
  Filled 2012-09-13: qty 5

## 2012-09-13 MED ORDER — PANTOPRAZOLE SODIUM 40 MG PO TBEC
40.0000 mg | DELAYED_RELEASE_TABLET | Freq: Every day | ORAL | Status: DC
  Administered 2012-09-13: 40 mg via ORAL
  Filled 2012-09-13: qty 1

## 2012-09-13 MED ORDER — SUMATRIPTAN SUCCINATE 100 MG PO TABS
100.0000 mg | ORAL_TABLET | Freq: Two times a day (BID) | ORAL | Status: DC | PRN
  Filled 2012-09-13: qty 1

## 2012-09-13 MED ORDER — AMITRIPTYLINE HCL 25 MG PO TABS
25.0000 mg | ORAL_TABLET | Freq: Every day | ORAL | Status: DC
Start: 2012-09-13 — End: 2012-09-13
  Administered 2012-09-13: 25 mg via ORAL
  Filled 2012-09-13 (×2): qty 1

## 2012-09-13 MED ORDER — SODIUM CHLORIDE 0.9 % IJ SOLN
10.0000 mL | INTRAMUSCULAR | Status: DC | PRN
  Administered 2012-09-13: 10 mL

## 2012-09-13 MED ORDER — VITAMIN B-12 1000 MCG PO TABS
1000.0000 ug | ORAL_TABLET | Freq: Every day | ORAL | Status: DC
  Administered 2012-09-13: 1000 ug via ORAL
  Filled 2012-09-13: qty 1

## 2012-09-13 MED ORDER — FLUTICASONE PROPIONATE 50 MCG/ACT NA SUSP: 1.0000 | Freq: Every day | NASAL | Status: DC | PRN

## 2012-09-13 NOTE — Discharge Summary (Signed)
Physician Discharge Summary  Patient ID: Marcus Brooks MRN: [id number] DOB/AGE: [birth year] 52 y.o.  Admit date: 09/12/2012 Discharge date: 09/13/2012  Admission Diagnoses: Left small finger near amputation status post reconstruction 09/12/2012  Discharge Diagnoses: Same Active Problems:   * No active hospital problems. *   Discharged Condition: fair  Hospital Course: Patient is a bit postop IV antibiotics he tolerated postop management algorithm nicely. At the time of discharge she was awake alert and oriented had a stable physical exam was voiding well and looks stable for discharge.  Consults: None  Significant Diagnostic Studies: labs: See  Treatments: surgery: See chart  Discharge Exam: Blood pressure 100/60, pulse 62, temperature 97.5 F (36.4 C), temperature source Oral, resp. rate 14, SpO2 97.00%. General appearance: alert and cooperative .Marland KitchenThe patient is alert and oriented in no acute distress the patient complains of pain in the affected upper extremity.  The patient is noted to have a normal HEENT exam.  Lung fields show equal chest expansion and no shortness of breath  abdomen exam is nontender without distention.  Lower extremity examination does not show any fracture dislocation or blood clot symptoms.  Pelvis is stable neck and back are stable and nontender  Disposition: 01-Home or Self Care     Medication List    ASK your doctor about these medications       albuterol 108 (90 BASE) MCG/ACT inhaler  Commonly known as:  PROVENTIL HFA;VENTOLIN HFA  Inhale 2 puffs into the lungs every 6 (six) hours as needed. Shortness of breath     amitriptyline 25 MG tablet  Commonly known as:  ELAVIL  Take 25 mg by mouth at bedtime.     amLODipine 10 MG tablet  Commonly known as:  NORVASC  Take 10 mg by mouth daily.     cholecalciferol 1000 UNITS tablet  Commonly known as:  VITAMIN D  Take 1,000 Units by mouth daily.     diazepam 10 MG tablet  Commonly known as:   VALIUM  Take 10 mg by mouth every 8 (eight) hours. anxiety     diphenhydrAMINE 25 mg capsule  Commonly known as:  BENADRYL  Take 25 mg by mouth every 6 (six) hours as needed. itching     fentaNYL 50 MCG/HR  Commonly known as:  DURAGESIC - dosed mcg/hr  Place 1 patch onto the skin every 3 (three) days. Remove if lethargic     fluticasone 50 MCG/ACT nasal spray  Commonly known as:  FLONASE  Place 1 spray into the nose daily as needed. allergies     gabapentin 300 MG capsule  Commonly known as:  NEURONTIN  Take 600 mg by mouth 2 (two) times daily.     HYDROcodone-acetaminophen 10-325 MG per tablet  Commonly known as:  NORCO  Take 1 tablet by mouth every 6 (six) hours as needed for pain (hold if lethargic).     loperamide 2 MG capsule  Commonly known as:  IMODIUM  Take 2 mg by mouth as needed for diarrhea or loose stools.     loratadine 10 MG tablet  Commonly known as:  CLARITIN  Take 10 mg by mouth daily.     omeprazole 20 MG capsule  Commonly known as:  PRILOSEC  Take 20 mg by mouth 2 (two) times daily.     phenytoin 100 MG ER capsule  Commonly known as:  DILANTIN  Take 300 mg by mouth daily.     potassium chloride 10 MEQ CR tablet  Commonly known as:  KLOR-CON  Take 10 mEq by mouth daily.     SUMAtriptan 100 MG tablet  Commonly known as:  IMITREX  Take 100 mg by mouth every 2 (two) hours as needed for migraine. migraines     tiotropium 18 MCG inhalation capsule  Commonly known as:  SPIRIVA  Place 18 mcg into inhaler and inhale daily.     traZODone 100 MG tablet  Commonly known as:  DESYREL  Take 200 mg by mouth at bedtime.     vitamin B-12 1000 MCG tablet  Commonly known as:  CYANOCOBALAMIN  Take 1,000 mcg by mouth daily.           Follow-up Information   Follow up with Karen Chafe, MD. (Call [phone] to see Dr. Amanda Pea in 12 days)    Specialty:  Orthopedic Surgery   Contact information:   [address] [place]  [address] [phone]       Signed: Karen Chafe 09/13/2012, 2:22 PM

## 2012-09-13 NOTE — Progress Notes (Signed)
Patient discharged today. Reviewed patient discharge plan and updated patient medications. IV team was notified to de -access porta cath. Patient was accompanied by wife and daughter per discharge. Maxie Better
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
## 2012-09-13 NOTE — Progress Notes (Signed)
Orthopedic Tech Progress Note Patient Details:  Marcus Brooks [birth year] [id number]  Ortho Devices Type of Ortho Device: Arm sling Ortho Device/Splint Interventions: Application   Cammer, Mickie Bail 09/13/2012, 12:07 PM

## 2012-09-13 NOTE — Progress Notes (Signed)
Patient ID: ANTONIS LOR, male   DOB: [birth year], 52 y.o.   MRN: [id number] Patient is stable for discharge. Final diagnosis status post ORIF, high and the, tendon and nerve repair left small finger near amputation 09/12/2012.  Please see DC summary.  Dominica Severin M.D.
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
## 2012-09-13 NOTE — Op Note (Signed)
NAMEELOISE, MULA                 ACCOUNT NO.:  [id number]  MEDICAL RECORD NO.:  [id number]  LOCATION:  [facility]                         FACILITY:  [facility]  PHYSICIAN:  Dionne Ano. Sasha Rueth, M.D.DATE OF BIRTH:  [birth year]  DATE OF PROCEDURE: DATE OF DISCHARGE:                              OPERATIVE REPORT   PREOPERATIVE DIAGNOSIS:  Left small finger open middle phalanx fracture with tendon and ulnar digital nerve as well as ulnar digital artery injury.  POSTOPERATIVE DIAGNOSIS: Left small finger open middle phalanx fracture with tendon and ulnar digital nerve as well as ulnar digital artery injury.  PROCEDURE: 1. Irrigation and debridement of skin and subcutaneous tissue, bone,     tendon, and associated soft tissues.  This was an excisional     debridement. 2. Open reduction and internal fixation.  Middle phalanx open     fracture, left small finger. 3. Extensor tendon repair, left small finger. 4. Ulnar digital nerve repair, left small finger. 5. Stress radiography, left hand.  SURGEON:  Dionne Ano. Amanda Pea, M.D.  ASSIST:  None.  COMPLICATION:  None.  ANESTHESIA:  General.  TOURNIQUET TIME:  0.  INDICATIONS:  A 52 year old male who appears older than stated age with host of multiple medical problems.  He injured his finger with a crushing injury today.  He understands risks and benefits of surgery and desires to proceed the above-mentioned operative intervention.  All questions have been encouraged and answered preoperatively.  OPERATIVE PROCEDURE:  The patient was seen by myself and anesthesia, taken to the operative suite, underwent smooth induction of general anesthetic.  He was prepped and draped in the usual sterile fashion with Betadine scrub and paint.  Once this was accomplished, the patient then underwent a final time-out.  Pre and postop check list were completed without difficulty.  The patient underwent a very careful and cautious I and D of skin, subcutaneous  tissue, bone, tendon, and associated soft tissues.  Curette, knife blade and scissor tip were used for the excisional debridement, greater than 3 L of saline were placed to the wound.  Following this, open reduction and internal fixation was accomplished about the middle phalanx comminuted fracture.  I placed small amount of bone graft in the area as the area looked very clean.  A 2 K-wires, 0.035 in nature, were placed through the distal phalanx into the proximal end of the distal phalanx across the DIP joint, engaging the middle phalanx, and the fracture.  I was able to achieve good fixation without difficulty.  There were no complications.  Pins were clipped outside the skin for later removal.  The patient maintained good refill and thus I felt that amputation would not be necessary.  There was a question of need for amputation versus attempted salvage.  Following this extensor tendon was repaired with absorbable suture. Given the very distal tendon frying over the terminal division and the amount of time he will be immobilized.  Following tendon repair, I then performed a ulnar digital nerve repair with 8-0 nylon under loupe magnification.  Coaptation of the nerve with a 8-0 nylon was accomplished.  The patient had refill and given the segmental injury to the  ulnar digital artery, I did not attempt repair here.  Following this the patient was irrigated, wound was closed with a chromic sutures and the patient was placed in a dressing of Adaptic, Xeroform gauze, finger splint, Webril, Kerlix and a fiberglass splint.  He was taken to recovery room in stable condition.  He will be monitored closely and will see him back in the office in 10-14 days or soon if there are any problems.  I am going to keep him overnight for IV antibiotics, of course.  Given his multiple medical issues, I want to be very careful with this patient, as he is at high risk for complications and  poor follow-through.  These notes have been discussed.  All questions have been encouraged and answered.  Hopefully, he will do quite well into the future.  It has been absolute pleasure to see him today.  All questions have been encouraged and answered.     Dionne Ano. Amanda Pea, M.D.     [facility]  D:  09/12/2012  T:  09/12/2012  Job:  [id number]

## 2012-09-16 NOTE — ED Provider Notes (Signed)
See prior note   Ward Givens, MD 09/16/12 [phone]

## 2012-09-23 NOTE — Anesthesia Postprocedure Evaluation (Signed)
Anesthesia Post Note  Patient: Marcus Brooks  Procedure(s) Performed: Procedure(s) (LRB): IRRIGATION AND DEBRIDEMENT LEFT FIFTH FINGER (Left) OPEN REDUCTION INTERNAL FIXATION LEFT FIFTH FINGER (Left) NERVE AND TENDON REPAIR LEFT FIFTH FINGER (Left)  Anesthesia type: General  Patient location: PACU  Post pain: Pain level controlled and Adequate analgesia  Post assessment: Post-op Vital signs reviewed, Patient's Cardiovascular Status Stable, Respiratory Function Stable, Patent Airway and Pain level controlled  Last Vitals:  Filed Vitals:   09/13/12 1440  BP: 113/62  Pulse: 75  Temp: 37.2 C  Resp: 20    Post vital signs: Reviewed and stable  Level of consciousness: awake, alert  and oriented  Complications: No apparent anesthesia complications

## 2012-10-03 ENCOUNTER — Encounter (HOSPITAL_COMMUNITY): Payer: Self-pay | Admitting: *Deleted

## 2012-10-03 ENCOUNTER — Inpatient Hospital Stay (HOSPITAL_COMMUNITY)
Admission: EM | Admit: 2012-10-03 | Discharge: 2012-10-04 | DRG: 440 | Discharge status: Against Medical Advice | Payer: Medicaid Other | Attending: Internal Medicine | Admitting: Internal Medicine

## 2012-10-03 ENCOUNTER — Emergency Department (HOSPITAL_COMMUNITY): Payer: Medicaid Other

## 2012-10-03 DIAGNOSIS — G4700 Insomnia, unspecified: Secondary | ICD-10-CM | POA: Diagnosis present

## 2012-10-03 DIAGNOSIS — J4489 Other specified chronic obstructive pulmonary disease: Secondary | ICD-10-CM | POA: Diagnosis present

## 2012-10-03 DIAGNOSIS — Z931 Gastrostomy status: Secondary | ICD-10-CM

## 2012-10-03 DIAGNOSIS — Z79899 Other long term (current) drug therapy: Secondary | ICD-10-CM

## 2012-10-03 DIAGNOSIS — K861 Other chronic pancreatitis: Secondary | ICD-10-CM | POA: Diagnosis present

## 2012-10-03 DIAGNOSIS — K219 Gastro-esophageal reflux disease without esophagitis: Secondary | ICD-10-CM | POA: Diagnosis present

## 2012-10-03 DIAGNOSIS — K859 Acute pancreatitis without necrosis or infection, unspecified: Principal | ICD-10-CM

## 2012-10-03 DIAGNOSIS — F17200 Nicotine dependence, unspecified, uncomplicated: Secondary | ICD-10-CM | POA: Diagnosis present

## 2012-10-03 DIAGNOSIS — G894 Chronic pain syndrome: Secondary | ICD-10-CM | POA: Diagnosis present

## 2012-10-03 DIAGNOSIS — D72829 Elevated white blood cell count, unspecified: Secondary | ICD-10-CM | POA: Diagnosis present

## 2012-10-03 DIAGNOSIS — F411 Generalized anxiety disorder: Secondary | ICD-10-CM | POA: Diagnosis present

## 2012-10-03 DIAGNOSIS — R079 Chest pain, unspecified: Secondary | ICD-10-CM

## 2012-10-03 DIAGNOSIS — J449 Chronic obstructive pulmonary disease, unspecified: Secondary | ICD-10-CM | POA: Diagnosis present

## 2012-10-03 DIAGNOSIS — I10 Essential (primary) hypertension: Secondary | ICD-10-CM | POA: Diagnosis present

## 2012-10-03 LAB — COMPREHENSIVE METABOLIC PANEL
Albumin: 3.2 g/dL — ABNORMAL LOW (ref 3.5–5.2)
BUN: 12 mg/dL (ref 6–23)
Calcium: 9.3 mg/dL (ref 8.4–10.5)
GFR calc Af Amer: >90 mL/min (ref >90)
Glucose, Bld: 110 mg/dL — ABNORMAL HIGH (ref 70–99)
Total Protein: 8.1 g/dL (ref 6.0–8.3)

## 2012-10-03 LAB — LIPASE, BLOOD: Lipase: 485 U/L — ABNORMAL HIGH (ref 11–59)

## 2012-10-03 LAB — CBC WITH DIFFERENTIAL/PLATELET
Basophils Relative: 1 % (ref 0–1)
Eosinophils Absolute: 0.1 10*3/uL (ref 0.0–0.7)
Eosinophils Relative: 1 % (ref 0–5)
Hemoglobin: 14.4 g/dL (ref 13.0–17.0)
Lymphs Abs: 2.1 10*3/uL (ref 0.7–4.0)
MCH: 34 pg (ref 26.0–34.0)
MCHC: 36.8 g/dL — ABNORMAL HIGH (ref 30.0–36.0)
MCV: 92.4 fL (ref 78.0–100.0)
Monocytes Relative: 9 % (ref 3–12)
Platelets: 214 10*3/uL (ref 150–400)
RBC: 4.23 MIL/uL (ref 4.22–5.81)

## 2012-10-03 LAB — POCT I-STAT TROPONIN I
Troponin i, poc: 0 ng/mL (ref 0.00–0.08)
Troponin i, poc: 0 ng/mL (ref 0.00–0.08)

## 2012-10-03 MED ORDER — IOHEXOL 300 MG/ML  SOLN
20.0000 mL | INTRAMUSCULAR | Status: AC
  Administered 2012-10-03: 25 mL via ORAL

## 2012-10-03 MED ORDER — PIPERACILLIN-TAZOBACTAM 3.375 G IVPB
3.3750 g | Freq: Three times a day (TID) | INTRAVENOUS | Status: DC
  Administered 2012-10-04 (×2): 3.375 g via INTRAVENOUS
  Filled 2012-10-03 (×4): qty 50

## 2012-10-03 MED ORDER — HYDROMORPHONE HCL PF 1 MG/ML IJ SOLN
1.0000 mg | Freq: Once | INTRAMUSCULAR | Status: AC
  Administered 2012-10-03: 1 mg via INTRAVENOUS
  Filled 2012-10-03: qty 1

## 2012-10-03 MED ORDER — SODIUM CHLORIDE 0.9 % IV BOLUS (SEPSIS)
1000.0000 mL | Freq: Once | INTRAVENOUS | Status: AC
  Administered 2012-10-03: 1000 mL via INTRAVENOUS

## 2012-10-03 MED ORDER — IOHEXOL 300 MG/ML  SOLN
100.0000 mL | Freq: Once | INTRAMUSCULAR | Status: AC | PRN
  Administered 2012-10-03: 100 mL via INTRAVENOUS

## 2012-10-03 MED ORDER — FENTANYL CITRATE 0.05 MG/ML IJ SOLN
50.0000 ug | Freq: Once | INTRAMUSCULAR | Status: AC
  Administered 2012-10-03: 50 ug via INTRAVENOUS
  Filled 2012-10-03: qty 2

## 2012-10-03 NOTE — ED Notes (Addendum)
2 days ago pt began having coughing and having L chest pain (near his port-a-cath) that radiated to his back.  Also c/o of pain to abdomen, around site of G tube.  States he never has pain in this area.  No bowel or bladder complaints.  Also feels weak and dizzy.

## 2012-10-03 NOTE — ED Notes (Signed)
CT notified pt has completed his contrast.

## 2012-10-03 NOTE — H&P (Signed)
[facility] History and Physical  Marcus Brooks YQM:[id number] DOB: 03/14/1960 DOA: 10/03/2012  Referring physician: ED physician PCP: Ernestine Conrad, MD   Chief Complaint: abdominal pain   HPI:  Pt is 52 yo male with chronic pancreatitis who presents to [facility] ED with main concern of 3-4 days duration of progressively worsening upper abdominal quadrants pain, intermittent and throbbing, 7/10 in severity when present and non radiating, with no specific alleviating factors, aggravated with oral intake, similar to previous episodes of pancreatitis flare up. Pt also reports associated subjective fevers, chills, urinary urgency and low urine output, substernal chest discomfort which is also intermittent and currently resolved. Pt denies shortness of breath.   In ED, pt with persistent abdominal pain, lipase > 400. [facility] asked to admit for acute pancreatitis management. Telemetry bed requested due to chest pain.  Assessment and Plan:  Principal Problem:   Acute pancreatitis - unclear etiology, mild transaminitis is suggestive of alcohol use but pt denies alcohol or drug use - will check Alcohol level and will repeat lipase in AM - provide supportive care with IVF, analgesia and antiemetics as needed - keep NPO for now - check level of triglycerides - CT abd/pelvis pending   Active Problems:   Leukocytosis - unclear etiology and CT abd pending (will see if abdominal source of an infection evident) - possibly UTI given urinary urgency, will obtain UA and urine culture - CXR unremarkable for an infectious etiology - blood cultures ordered  - will cover with Zosyn for now and this can be discontinued or tapered down once preliminary results on cultures back and CT abd results available    Transaminitis - unclear etiology, ? Alcohol use - will order alcohol level and repeat CMET in AM   Chest pain - monitor on telemetry, cycle CE's, check 12 lead EKG, TSH, UDS - analgesia and oxygen as  needed   Hypertension - reasonable BP on admission - monitor vitals per protocol    GERD (gastroesophageal reflux disease) - continue PPI  Code Status: Full Family Communication: Pt at bedside Disposition Plan: Admit to telemetry bed   Review of Systems:  Constitutional: Negative for diaphoresis.  HENT: Negative for hearing loss, ear pain, nosebleeds, congestion, sore throat, neck pain, tinnitus and ear discharge.   Eyes: Negative for blurred vision, double vision, photophobia, pain, discharge and redness.  Respiratory: Negative for cough, hemoptysis, sputum production, shortness of breath, wheezing and stridor.   Cardiovascular: Negative for palpitations, orthopnea, claudication and leg swelling.  Gastrointestinal: Negative for heartburn, constipation, blood in stool and melena.  Genitourinary: Negative for dysuria, urgency, frequency, hematuria and flank pain.  Musculoskeletal: Negative for myalgias, joint pain and falls.  Skin: Negative for itching and rash.  Neurological: Negative for tingling, tremors, sensory change, speech change, focal weakness, loss of consciousness and headaches.  Endo/Heme/Allergies: Negative for environmental allergies and polydipsia. Does not bruise/bleed easily.  Psychiatric/Behavioral: Negative for suicidal ideas. The patient is not nervous/anxious.      Past Medical History  Diagnosis Date  . Pancreatitis chronic 08/04/2010  . GERD (gastroesophageal reflux disease)   . Insomnia 08/04/2010  . Seizure 08/04/2010  . Chronic pain syndrome 08/04/2010  . Unspecified essential hypertension 08/04/2010  . Chronic pancreatitis 08/04/2010  . Anxiety states 08/04/2010  . Chronic airway obstruction, not elsewhere classified 08/04/2010  . Tobacco use disorder 08/04/2010  . Other abnormal glucose 08/04/2010    Past Surgical History  Procedure Laterality Date  . Jejunostomy feeding tube  07/13/2010  . Pancreatic  pseudocyst drainage  07/13/2010  . Eus   03/26/2009    [facility]  . Portacath placement  11/28/2008    FLEISHMAN  . Gastrostomy-jejeunostomy tube change/placement  12/11/2008    [facility] SHICK  . I&d extremity Left 09/12/2012    Procedure: IRRIGATION AND DEBRIDEMENT LEFT FIFTH FINGER;  Surgeon: Dominica Severin, MD;  Location: [facility] OR;  Service: Orthopedics;  Laterality: Left;  . Open reduction internal fixation (orif) metacarpal Left 09/12/2012    Procedure: OPEN REDUCTION INTERNAL FIXATION LEFT FIFTH FINGER;  Surgeon: Dominica Severin, MD;  Location: [facility] OR;  Service: Orthopedics;  Laterality: Left;  . Nerve and tendon repair Left 09/12/2012    Procedure: NERVE AND TENDON REPAIR LEFT FIFTH FINGER;  Surgeon: Dominica Severin, MD;  Location: [facility] OR;  Service: Orthopedics;  Laterality: Left;    Social History:  reports that he has been smoking Cigarettes.  He has a 20 pack-year smoking history. He does not have any smokeless tobacco history on file. He reports that he does not drink alcohol or use illicit drugs.  Allergies  Allergen Reactions  . Cheese Hives   No known family medical history   Prior to Admission medications   Medication Sig Start Date End Date Taking? Authorizing Provider  albuterol (PROVENTIL HFA;VENTOLIN HFA) 108 (90 BASE) MCG/ACT inhaler Inhale 2 puffs into the lungs every 6 (six) hours as needed for wheezing or shortness of breath.    Yes Historical Provider, MD  amitriptyline (ELAVIL) 25 MG tablet Take 25 mg by mouth at bedtime.     Yes Historical Provider, MD  amLODipine (NORVASC) 10 MG tablet Take 10 mg by mouth daily.     Yes Historical Provider, MD  cholecalciferol (VITAMIN D) 1000 UNITS tablet Take 1,000 Units by mouth daily.     Yes Historical Provider, MD  diazepam (VALIUM) 10 MG tablet Take 10 mg by mouth 3 (three) times daily as needed for anxiety.   Yes Historical Provider, MD  gabapentin (NEURONTIN) 300 MG capsule Take 1,200 mg by mouth 2 (two) times daily.   Yes Historical Provider, MD   HYDROcodone-acetaminophen (NORCO) 10-325 MG per tablet Take 1 tablet by mouth every 6 (six) hours as needed for pain (hold if lethargic).   Yes Historical Provider, MD  loperamide (IMODIUM) 2 MG capsule Take 8 mg by mouth daily.   Yes Historical Provider, MD  loratadine (CLARITIN) 10 MG tablet Take 10 mg by mouth daily.     Yes Historical Provider, MD  omeprazole (PRILOSEC) 20 MG capsule Take 20 mg by mouth 2 (two) times daily.     Yes Historical Provider, MD  pantoprazole (PROTONIX) 40 MG tablet Take 40 mg by mouth daily.   Yes Historical Provider, MD  phenytoin (DILANTIN) 100 MG ER capsule Take 300 mg by mouth daily.    Yes Historical Provider, MD  potassium chloride (KLOR-CON) 10 MEQ CR tablet Take 10 mEq by mouth daily.     Yes Historical Provider, MD  tiotropium (SPIRIVA) 18 MCG inhalation capsule Place 18 mcg into inhaler and inhale daily.     Yes Historical Provider, MD  traZODone (DESYREL) 100 MG tablet Take 200 mg by mouth at bedtime.    Yes Historical Provider, MD  vitamin B-12 (CYANOCOBALAMIN) 1000 MCG tablet Take 1,000 mcg by mouth daily.   Yes Historical Provider, MD    Physical Exam: Filed Vitals:   10/03/12 1650 10/03/12 1955 10/03/12 2000  BP: 134/79 130/75 133/65  Pulse: 97 83 81  Temp: 99.7 F (37.6  C) 98.7 F (37.1 C)   TempSrc: Oral Oral   Resp: 20  16  Height: 5\' 7"  (1.702 m)    Weight: 64.864 kg (143 lb)    SpO2: 96% 95% 95%    Physical Exam  Constitutional: Appears well-developed and well-nourished. No distress.  HENT: Normocephalic. External right and left ear normal. Dry MM  Eyes: Conjunctivae and EOM are normal. PERRLA, no scleral icterus.  Neck: Normal ROM. Neck supple. No JVD. No tracheal deviation. No thyromegaly.  CVS: RRR, S1/S2 +, no murmurs, no gallops, no carotid bruit.  Pulmonary: Effort and breath sounds normal, no stridor, rhonchi, wheezes, rales.  Abdominal: Soft. BS +,  no distension, tenderness in upper abdominal quadrants, no rebound or  guarding.  Musculoskeletal: Normal range of motion. No edema and no tenderness.  Lymphadenopathy: No lymphadenopathy noted, cervical, inguinal. Neuro: Alert. Normal reflexes, muscle tone coordination. No cranial nerve deficit. Skin: Skin is warm and dry. No rash noted. Not diaphoretic. No erythema. No pallor.  Psychiatric: Normal mood and affect. Behavior, judgment, thought content normal.   Labs on Admission:  Basic Metabolic Panel:  Recent Labs Lab 10/03/12 1713  NA 132*  K 4.0  CL 97  CO2 21  GLUCOSE 110*  BUN 12  CREATININE 0.62  CALCIUM 9.3   Liver Function Tests:  Recent Labs Lab 10/03/12 1713  AST 47*  ALT 44  ALKPHOS 99  BILITOT 0.7  PROT 8.1  ALBUMIN 3.2*    Recent Labs Lab 10/03/12 2015  LIPASE 485*   CBC:  Recent Labs Lab 10/03/12 1713  WBC 16.7*  NEUTROABS 12.8*  HGB 14.4  HCT 39.1  MCV 92.4  PLT 214   Radiological Exams on Admission: Dg Chest 2 View  10/03/2012   CLINICAL DATA:  Left-sided chest and abdominal pain. Chronic pancreatitis. Feeding tube for 8 years. Pain extends down the left arm.  EXAM: CHEST  2 VIEW  COMPARISON:  09/12/2012  FINDINGS: Patient's left-sided Port-A-Cath, tip overlying the level of the superior vena cava. Heart size is normal. There is minimal bibasilar scarring or atelectasis. Small bilateral pleural effusions are noted. No evidence for pneumothorax or pulmonary edema.  IMPRESSION: 1. Small bilateral pleural effusions. 2. Bibasilar atelectasis or scarring.   Electronically Signed   By: Rosalie Gums M.D.   On: 10/03/2012 17:47    EKG: Normal sinus rhythm, no ST/T wave changes  Debbora Presto, MD  [facility] Pager [phone]  If 7PM-7AM, please contact night-coverage [email or web address] Password [facility] 10/03/2012, 11:46 PM

## 2012-10-03 NOTE — ED Notes (Addendum)
Called for room x 2.  

## 2012-10-03 NOTE — ED Notes (Signed)
Report given to new nurse taking over for pt.  Pt off to CT

## 2012-10-03 NOTE — ED Provider Notes (Signed)
CSN: [id number]     Arrival date & time 10/03/12  1637 History   First MD Initiated Contact with Patient 10/03/12 2001     Chief Complaint  Patient presents with  . Chest Pain  . Abdominal Pain   (Consider location/radiation/quality/duration/timing/severity/associated sxs/prior Treatment) HPI Comments: 52 year old male presents with 3 days of abdominal pain. He states it is in his epigastrium surrounding his G-tube. He has had decreased urine and darker urine and time. The pain is a severe 10 out of 10. He says feels like his prior episodes of pancreatitis. It is also similar to the time he had a "fungus infection" around his G-tube. The pain also radiates from his abdomen up into his chest and down his arm. Patient been unable to get his pain control at home.  The history is provided by the patient.    Past Medical History  Diagnosis Date  . Pancreatitis chronic 08/04/2010  . GERD (gastroesophageal reflux disease)   . Insomnia 08/04/2010  . Seizure 08/04/2010  . Chronic pain syndrome 08/04/2010  . Unspecified essential hypertension 08/04/2010  . Chronic pancreatitis 08/04/2010  . Anxiety states 08/04/2010  . Chronic airway obstruction, not elsewhere classified 08/04/2010  . Tobacco use disorder 08/04/2010  . Other abnormal glucose 08/04/2010   Past Surgical History  Procedure Laterality Date  . Jejunostomy feeding tube  07/13/2010  . Pancreatic pseudocyst drainage  07/13/2010  . Eus  03/26/2009    [facility]  . Portacath placement  11/28/2008    F[facility]ISHMAN  . Gastrostomy-jejeunostomy tube change/placement  12/11/2008    [facility]HAEL SHICK  . I&d extremity Left 09/12/2012    Procedure: IRRIGATION AND DEBRIDEMENT LEFT FIFTH FINGER;  Surgeon: Dominica Severin, MD;  Location: [facility] OR;  Service: Orthopedics;  Laterality: Left;  . Open reduction internal fixation (orif) metacarpal Left 09/12/2012    Procedure: OPEN REDUCTION INTERNAL FIXATION LEFT FIFTH FINGER;  Surgeon: Dominica Severin, MD;   Location: [facility] OR;  Service: Orthopedics;  Laterality: Left;  . Nerve and tendon repair Left 09/12/2012    Procedure: NERVE AND TENDON REPAIR LEFT FIFTH FINGER;  Surgeon: Dominica Severin, MD;  Location: [facility] OR;  Service: Orthopedics;  Laterality: Left;   No family history on file. History  Substance Use Topics  . Smoking status: Current Every Day Smoker -- 1.00 packs/day for 20 years    Types: Cigarettes  . Smokeless tobacco: Not on file     Comment: 1 pack a day since age 33  . Alcohol Use: No     Comment: No etoh since 5-6 yrs    Review of Systems  Constitutional: Negative for fever.  Respiratory: Negative for shortness of breath.   Cardiovascular: Positive for chest pain.  Gastrointestinal: Positive for abdominal pain and diarrhea. Negative for nausea and vomiting.  Genitourinary: Positive for decreased urine volume. Negative for dysuria.  All other systems reviewed and are negative.    Allergies  Cheese  Home Medications   Current Outpatient Rx  Name  Route  Sig  Dispense  Refill  . albuterol (PROVENTIL HFA;VENTOLIN HFA) 108 (90 BASE) MCG/ACT inhaler   Inhalation   Inhale 2 puffs into the lungs every 6 (six) hours as needed for wheezing or shortness of breath.          Marland Kitchen amitriptyline (ELAVIL) 25 MG tablet   Oral   Take 25 mg by mouth at bedtime.           Marland Kitchen amLODipine (NORVASC) 10 MG tablet   Oral  Take 10 mg by mouth daily.           . cholecalciferol (VITAMIN D) 1000 UNITS tablet   Oral   Take 1,000 Units by mouth daily.           . diazepam (VALIUM) 10 MG tablet   Oral   Take 10 mg by mouth 3 (three) times daily as needed for anxiety.         . gabapentin (NEURONTIN) 300 MG capsule   Oral   Take 1,200 mg by mouth 2 (two) times daily.         Marland Kitchen HYDROcodone-acetaminophen (NORCO) 10-325 MG per tablet   Oral   Take 1 tablet by mouth every 6 (six) hours as needed for pain (hold if lethargic).         Marland Kitchen loperamide (IMODIUM) 2 MG capsule   Oral    Take 8 mg by mouth daily.         Marland Kitchen loratadine (CLARITIN) 10 MG tablet   Oral   Take 10 mg by mouth daily.           Marland Kitchen omeprazole (PRILOSEC) 20 MG capsule   Oral   Take 20 mg by mouth 2 (two) times daily.           . pantoprazole (PROTONIX) 40 MG tablet   Oral   Take 40 mg by mouth daily.         . phenytoin (DILANTIN) 100 MG ER capsule   Oral   Take 300 mg by mouth daily.          . potassium chloride (KLOR-CON) 10 MEQ CR tablet   Oral   Take 10 mEq by mouth daily.           Marland Kitchen tiotropium (SPIRIVA) 18 MCG inhalation capsule   Inhalation   Place 18 mcg into inhaler and inhale daily.           . traZODone (DESYREL) 100 MG tablet   Oral   Take 200 mg by mouth at bedtime.          . vitamin B-12 (CYANOCOBALAMIN) 1000 MCG tablet   Oral   Take 1,000 mcg by mouth daily.          BP 133/65  Pulse 81  Temp(Src) 98.7 F (37.1 C) (Oral)  Resp 16  Ht 5\' 7"  (1.702 m)  Wt 143 lb (64.864 kg)  BMI 22.39 kg/m2  SpO2 95% Physical Exam  Nursing note and vitals reviewed. Constitutional: He is oriented to person, place, and time. He appears well-developed and well-nourished. No distress.  HENT:  Head: Normocephalic and atraumatic.  Right Ear: External ear normal.  Left Ear: External ear normal.  Nose: Nose normal.  Eyes: Right eye exhibits no discharge. Left eye exhibits no discharge.  Neck: Neck supple.  Cardiovascular: Normal rate, regular rhythm, normal heart sounds and intact distal pulses.   Pulmonary/Chest: Effort normal and breath sounds normal.  Abdominal: Soft. There is tenderness.  G-tube without signs of infection or induration  Musculoskeletal: He exhibits no edema.  Neurological: He is alert and oriented to person, place, and time.  Skin: Skin is warm and dry.    ED Course  Procedures (including critical care time) Labs Review Labs Reviewed  CBC WITH DIFFERENTIAL - Abnormal; Notable for the following:    WBC 16.7 (*)    MCHC 36.8 (*)     Neutro Abs 12.8 (*)    Monocytes Absolute 1.6 (*)  All other components within normal limits  COMPREHENSIVE METABOLIC PANEL - Abnormal; Notable for the following:    Sodium 132 (*)    Glucose, Bld 110 (*)    Albumin 3.2 (*)    AST 47 (*)    All other components within normal limits  LIPASE, BLOOD - Abnormal; Notable for the following:    Lipase 485 (*)    All other components within normal limits  POCT I-STAT TROPONIN I  POCT I-STAT TROPONIN I   Imaging Review Dg Chest 2 View  10/03/2012   CLINICAL DATA:  Left-sided chest and abdominal pain. Chronic pancreatitis. Feeding tube for 8 years. Pain extends down the left arm.  EXAM: CHEST  2 VIEW  COMPARISON:  09/12/2012  FINDINGS: Patient's left-sided Port-A-Cath, tip overlying the level of the superior vena cava. Heart size is normal. There is minimal bibasilar scarring or atelectasis. Small bilateral pleural effusions are noted. No evidence for pneumothorax or pulmonary edema.  IMPRESSION: 1. Small bilateral pleural effusions. 2. Bibasilar atelectasis or scarring.   Electronically Signed   By: Rosalie Gums M.D.   On: 10/03/2012 17:47     Date: 10/03/2012  Rate: 101  Rhythm: sinus tachycardia  QRS Axis: normal  Intervals: normal  ST/T Wave abnormalities: normal  Conduction Disutrbances:none  Narrative Interpretation: Sinus tach w/o ischemia  Old EKG Reviewed: unchanged   MDM   1. Pancreatitis    Patient has benign abdominal exam. Gait is infrequent pancreatitis with elevated lipase L. obtain a CT scan to rule out pseudocyst or other complication. Is given IV Dilaudid for pain control as well as fluids. Due to pain he has in addition to his elevated white count and chronic history we'll admit to the hospital for pain control and IV hydration.    Audree Camel, MD 10/03/12 [phone]

## 2012-10-03 NOTE — ED Notes (Signed)
Pt now c/o sob as well.

## 2012-10-04 ENCOUNTER — Encounter (HOSPITAL_COMMUNITY): Payer: Self-pay | Admitting: *Deleted

## 2012-10-04 LAB — COMPREHENSIVE METABOLIC PANEL
ALT: 36 U/L (ref 0–53)
AST: 31 U/L (ref 0–37)
Alkaline Phosphatase: 94 U/L (ref 39–117)
CO2: 23 mEq/L (ref 19–32)
Calcium: 8.7 mg/dL (ref 8.4–10.5)
GFR calc non Af Amer: >90 mL/min (ref >90)
Potassium: 3.9 mEq/L (ref 3.5–5.1)
Sodium: 136 mEq/L (ref 135–145)

## 2012-10-04 LAB — URINALYSIS, ROUTINE W REFLEX MICROSCOPIC
Bilirubin Urine: NEGATIVE
Ketones, ur: NEGATIVE mg/dL
Leukocytes, UA: NEGATIVE
Nitrite: NEGATIVE
Urobilinogen, UA: 1 mg/dL (ref 0.0–1.0)
pH: 6 (ref 5.0–8.0)

## 2012-10-04 LAB — CBC
HCT: 36.9 % — ABNORMAL LOW (ref 39.0–52.0)
Hemoglobin: 13.4 g/dL (ref 13.0–17.0)
MCH: 33.8 pg (ref 26.0–34.0)
MCHC: 36.3 g/dL — ABNORMAL HIGH (ref 30.0–36.0)
Platelets: 209 10*3/uL (ref 150–400)
RDW: 13.1 % (ref 11.5–15.5)

## 2012-10-04 LAB — HEMOGLOBIN A1C
Hgb A1c MFr Bld: 5 % (ref <5.7)
Mean Plasma Glucose: 97 mg/dL (ref <117)

## 2012-10-04 LAB — TROPONIN I: Troponin I: <0.3 ng/mL (ref <0.30)

## 2012-10-04 LAB — RAPID URINE DRUG SCREEN, HOSP PERFORMED
Barbiturates: NOT DETECTED
Benzodiazepines: POSITIVE — AB
Cocaine: NOT DETECTED
Opiates: NOT DETECTED
Tetrahydrocannabinol: NOT DETECTED

## 2012-10-04 LAB — LIPID PANEL
Cholesterol: 141 mg/dL (ref 0–200)
Total CHOL/HDL Ratio: 5.6 RATIO
VLDL: 16 mg/dL (ref 0–40)

## 2012-10-04 LAB — MAGNESIUM: Magnesium: 1.9 mg/dL (ref 1.5–2.5)

## 2012-10-04 LAB — ETHANOL: Alcohol, Ethyl (B): <11 mg/dL (ref 0–11)

## 2012-10-04 LAB — LIPASE, BLOOD: Lipase: 274 U/L — ABNORMAL HIGH (ref 11–59)

## 2012-10-04 MED ORDER — POTASSIUM CHLORIDE CRYS ER 10 MEQ PO TBCR
10.0000 meq | EXTENDED_RELEASE_TABLET | Freq: Every day | ORAL | Status: DC
  Administered 2012-10-04: 10 meq via ORAL
  Filled 2012-10-04: qty 1

## 2012-10-04 MED ORDER — DIAZEPAM 5 MG PO TABS: 10.0000 mg | ORAL_TABLET | Freq: Three times a day (TID) | ORAL | Status: DC | PRN

## 2012-10-04 MED ORDER — GABAPENTIN 400 MG PO CAPS
1200.0000 mg | ORAL_CAPSULE | Freq: Two times a day (BID) | ORAL | Status: DC
  Administered 2012-10-04 (×2): 1200 mg via ORAL
  Filled 2012-10-04 (×3): qty 3

## 2012-10-04 MED ORDER — HYDROMORPHONE HCL PF 1 MG/ML IJ SOLN
1.0000 mg | INTRAMUSCULAR | Status: DC | PRN
  Administered 2012-10-04: 1 mg via INTRAVENOUS
  Filled 2012-10-04: qty 1

## 2012-10-04 MED ORDER — ALBUTEROL SULFATE (5 MG/ML) 0.5% IN NEBU: 2.5000 mg | INHALATION_SOLUTION | RESPIRATORY_TRACT | Status: DC | PRN

## 2012-10-04 MED ORDER — PHENYTOIN SODIUM EXTENDED 100 MG PO CAPS
300.0000 mg | ORAL_CAPSULE | Freq: Every day | ORAL | Status: DC
  Administered 2012-10-04: 300 mg via ORAL
  Filled 2012-10-04: qty 3

## 2012-10-04 MED ORDER — SODIUM CHLORIDE 0.9 % IJ SOLN
10.0000 mL | INTRAMUSCULAR | Status: DC | PRN
  Administered 2012-10-04 (×2): 10 mL

## 2012-10-04 MED ORDER — ONDANSETRON HCL 4 MG PO TABS: 4.0000 mg | ORAL_TABLET | Freq: Four times a day (QID) | ORAL | Status: DC | PRN

## 2012-10-04 MED ORDER — SODIUM CHLORIDE 0.9 % IJ SOLN: 3.0000 mL | Freq: Two times a day (BID) | INTRAMUSCULAR | Status: DC

## 2012-10-04 MED ORDER — ENOXAPARIN SODIUM 40 MG/0.4ML ~~LOC~~ SOLN
40.0000 mg | SUBCUTANEOUS | Status: DC
  Filled 2012-10-04 (×2): qty 0.4

## 2012-10-04 MED ORDER — AMLODIPINE BESYLATE 10 MG PO TABS
10.0000 mg | ORAL_TABLET | Freq: Every day | ORAL | Status: DC
  Administered 2012-10-04: 10 mg via ORAL
  Filled 2012-10-04: qty 1

## 2012-10-04 MED ORDER — TIOTROPIUM BROMIDE MONOHYDRATE 18 MCG IN CAPS
18.0000 ug | ORAL_CAPSULE | Freq: Every day | RESPIRATORY_TRACT | Status: DC
  Administered 2012-10-04: 18 ug via RESPIRATORY_TRACT
  Filled 2012-10-04: qty 5

## 2012-10-04 MED ORDER — SODIUM CHLORIDE 0.9 % IV SOLN
INTRAVENOUS | Status: DC
  Administered 2012-10-04: 01:00:00 via INTRAVENOUS

## 2012-10-04 MED ORDER — AMITRIPTYLINE HCL 25 MG PO TABS
25.0000 mg | ORAL_TABLET | Freq: Every day | ORAL | Status: DC
  Administered 2012-10-04: 25 mg via ORAL
  Filled 2012-10-04 (×2): qty 1

## 2012-10-04 MED ORDER — ONDANSETRON HCL 4 MG/2ML IJ SOLN: 4.0000 mg | Freq: Four times a day (QID) | INTRAMUSCULAR | Status: DC | PRN

## 2012-10-04 MED ORDER — TRAZODONE HCL 100 MG PO TABS
200.0000 mg | ORAL_TABLET | Freq: Every day | ORAL | Status: DC
Start: 2012-10-04 — End: 2012-10-04
  Administered 2012-10-04: 200 mg via ORAL
  Filled 2012-10-04 (×2): qty 2

## 2012-10-04 MED ORDER — HYDROCODONE-ACETAMINOPHEN 5-325 MG PO TABS
1.0000 | ORAL_TABLET | ORAL | Status: DC | PRN
  Administered 2012-10-04 (×2): 2 via ORAL
  Filled 2012-10-04 (×2): qty 2

## 2012-10-04 MED ORDER — HEPARIN SOD (PORK) LOCK FLUSH 100 UNIT/ML IV SOLN
500.0000 [IU] | INTRAVENOUS | Status: AC | PRN
  Administered 2012-10-04: 500 [IU]

## 2012-10-04 MED ORDER — PANTOPRAZOLE SODIUM 40 MG PO TBEC
40.0000 mg | DELAYED_RELEASE_TABLET | Freq: Every day | ORAL | Status: DC
Start: 2012-10-04 — End: 2012-10-04
  Administered 2012-10-04: 40 mg via ORAL
  Filled 2012-10-04: qty 1

## 2012-10-04 NOTE — Discharge Summary (Signed)
Patient left AMA..   Pt is 52 yo male with chronic pancreatitis who presents to [facility] ED with main concern of 3-4 days duration of progressively worsening upper abdominal quadrants pain, intermittent and throbbing, 7/10 in severity when present and non radiating, with no specific alleviating factors, aggravated with oral intake, similar to previous episodes of pancreatitis flare up. Pt also reports associated subjective fevers, chills, urinary urgency and low urine output, substernal chest discomfort which is also intermittent and currently resolved. Pt denies shortness of breath.  In ED, pt with persistent abdominal pain, lipase > 400. [facility] asked to admit for acute pancreatitis management. Telemetry bed requested due to chest pain.    Patient woke up and wanted to leave AMA. We have explainted to the patient of the consequences of not being cared int he hospital. He was adamant and wanted to [facility] the hospital AMA.    Rashard Ryle.

## 2012-10-04 NOTE — ED Notes (Signed)
Pt given a urinal for a urine sample

## 2012-10-04 NOTE — ED Notes (Signed)
Phlebotomy informed pt needs to have blood cultures drawn.

## 2012-10-04 NOTE — Progress Notes (Signed)
Pt has taken off his telemetry box, stating "I'm ready to go home, I got to get out of here", replies "I have my truck out there" when asked how he's getting home, or if wife coming to get him.  Pt's IV  connected to his PAC, claims he knows how to disconnect, "I've done it before".  Text message sent to Dr Blake Divine.

## 2012-10-04 NOTE — ED Notes (Signed)
Unable to obtain blood cultures, pt was stuck twice by phlebotomy and his veins did not bleed for [place]. Attempted to pull off blood from pt's port, unable to obtain blood from port. Charge nurse informed. Antibiotic started without blood cultures.

## 2012-10-04 NOTE — Progress Notes (Signed)
Dr Blake Divine came up to see pt, pt still wanted to go home, pt signed AMA form and was instructed on what it means- RN Programmer, multimedia, had pt to sign AMA form after explaining it to pt and wife that was now at bedside. IV team notified and here to disconnect PAC and flush per protocal. Pt ambulated out with family with his belongings.

## 2012-10-05 LAB — URINE CULTURE
Colony Count: NO GROWTH
Culture: NO GROWTH

## 2012-10-10 LAB — CULTURE, BLOOD (ROUTINE X 2): Culture: NO GROWTH

## 2012-12-29 ENCOUNTER — Telehealth (INDEPENDENT_AMBULATORY_CARE_PROVIDER_SITE_OTHER): Payer: Self-pay | Admitting: *Deleted

## 2012-12-29 NOTE — Telephone Encounter (Signed)
Marcus Brooks called the office and stated to Lupita Leash, that he was in severe pain, throwing up. Stated that he was really throwing up after the 4 bags of fluid is administered through the feeding tube. I recommended that the patient go to the ED for an evaluation

## 2012-12-29 NOTE — Telephone Encounter (Signed)
Angelica called stating he was in serve pain. He is also vomiting after 4 bags go through his feeding tube. Advised patient he needed to go to the ED, that Dr. Karilyn Cota is not in office. Once he has been seen at the ED and needs a f/u, I will be glad to get that apt scheduled.

## 2013-01-08 ENCOUNTER — Inpatient Hospital Stay (HOSPITAL_COMMUNITY)
Admission: EM | Admit: 2013-01-08 | Discharge: 2013-01-11 | DRG: 438 | Disposition: A | Discharge status: Home / Self Care | Payer: Medicaid Other | Attending: Family Medicine | Admitting: Family Medicine

## 2013-01-08 ENCOUNTER — Encounter (HOSPITAL_COMMUNITY): Payer: Self-pay | Admitting: Emergency Medicine

## 2013-01-08 ENCOUNTER — Emergency Department (HOSPITAL_COMMUNITY): Payer: Medicaid Other

## 2013-01-08 DIAGNOSIS — G40909 Epilepsy, unspecified, not intractable, without status epilepticus: Secondary | ICD-10-CM

## 2013-01-08 DIAGNOSIS — G894 Chronic pain syndrome: Secondary | ICD-10-CM | POA: Diagnosis present

## 2013-01-08 DIAGNOSIS — J4489 Other specified chronic obstructive pulmonary disease: Secondary | ICD-10-CM | POA: Diagnosis present

## 2013-01-08 DIAGNOSIS — K861 Other chronic pancreatitis: Secondary | ICD-10-CM

## 2013-01-08 DIAGNOSIS — I10 Essential (primary) hypertension: Secondary | ICD-10-CM

## 2013-01-08 DIAGNOSIS — K859 Acute pancreatitis without necrosis or infection, unspecified: Principal | ICD-10-CM

## 2013-01-08 DIAGNOSIS — Z23 Encounter for immunization: Secondary | ICD-10-CM

## 2013-01-08 DIAGNOSIS — R55 Syncope and collapse: Secondary | ICD-10-CM

## 2013-01-08 DIAGNOSIS — K219 Gastro-esophageal reflux disease without esophagitis: Secondary | ICD-10-CM | POA: Diagnosis present

## 2013-01-08 DIAGNOSIS — E43 Unspecified severe protein-calorie malnutrition: Secondary | ICD-10-CM

## 2013-01-08 DIAGNOSIS — Z681 Body mass index (BMI) 19 or less, adult: Secondary | ICD-10-CM

## 2013-01-08 DIAGNOSIS — Z931 Gastrostomy status: Secondary | ICD-10-CM

## 2013-01-08 DIAGNOSIS — K920 Hematemesis: Secondary | ICD-10-CM

## 2013-01-08 DIAGNOSIS — F17200 Nicotine dependence, unspecified, uncomplicated: Secondary | ICD-10-CM | POA: Diagnosis present

## 2013-01-08 DIAGNOSIS — F411 Generalized anxiety disorder: Secondary | ICD-10-CM | POA: Diagnosis present

## 2013-01-08 DIAGNOSIS — F1020 Alcohol dependence, uncomplicated: Secondary | ICD-10-CM | POA: Diagnosis present

## 2013-01-08 DIAGNOSIS — D7389 Other diseases of spleen: Secondary | ICD-10-CM | POA: Diagnosis present

## 2013-01-08 DIAGNOSIS — I2510 Atherosclerotic heart disease of native coronary artery without angina pectoris: Secondary | ICD-10-CM | POA: Diagnosis present

## 2013-01-08 DIAGNOSIS — Z79899 Other long term (current) drug therapy: Secondary | ICD-10-CM

## 2013-01-08 DIAGNOSIS — J449 Chronic obstructive pulmonary disease, unspecified: Secondary | ICD-10-CM | POA: Diagnosis present

## 2013-01-08 MED ORDER — SODIUM CHLORIDE 0.9 % IV BOLUS (SEPSIS)
1000.0000 mL | Freq: Once | INTRAVENOUS | Status: AC
  Administered 2013-01-09: 1000 mL via INTRAVENOUS

## 2013-01-08 MED ORDER — ONDANSETRON HCL 4 MG/2ML IJ SOLN
4.0000 mg | Freq: Once | INTRAMUSCULAR | Status: AC
  Administered 2013-01-09: 4 mg via INTRAVENOUS
  Filled 2013-01-08: qty 2

## 2013-01-08 MED ORDER — FAMOTIDINE IN NACL 20-0.9 MG/50ML-% IV SOLN
20.0000 mg | INTRAVENOUS | Status: AC
  Administered 2013-01-09: 20 mg via INTRAVENOUS
  Filled 2013-01-08: qty 50

## 2013-01-08 MED ORDER — PANTOPRAZOLE SODIUM 40 MG IV SOLR
40.0000 mg | INTRAVENOUS | Status: AC
  Administered 2013-01-09: 40 mg via INTRAVENOUS
  Filled 2013-01-08: qty 40

## 2013-01-08 NOTE — ED Notes (Signed)
Wife states that patient has been vomiting blood, feeling weak and dizzy. States that he has passed out a couple of times. History of pancreatitis and he has a port-a-cath.

## 2013-01-08 NOTE — ED Provider Notes (Signed)
CSN: [id number]     Arrival date & time 01/08/13  2218 History   This chart was scribed for Vida Roller, MD, by Yevette Edwards, ED Scribe. This patient was seen in room APA19/APA19 and the patient's care was started at 11:30 PM.  First MD Initiated Contact with Patient 01/08/13 2329     Chief Complaint  Patient presents with  . Hematemesis  . Loss of Consciousness  . Weakness   HPI HPI Comments: Marcus Brooks is a 52 y.o. male who presents to the Emergency Department complaining of gradually-worsening, left-lower abdominal pain which radiates to his back and which began a week ago; the abdominal pain has been associated with hematemesis and three episodes of syncope. The pt characterizes the hematemesis as "black like coffee grounds" as well as "like blood clots."  He reports increased abdominal pain when supine. He receives nourishment via a feeding tube, though he reports a decreased appetite.  He denies a fever. He also denies a h/o hernias. He reports the symptoms are similar to previous episodes of pancreatitis. The pt has a h/o pancreatic cancer and he had pancreatic surgery at [facility]  nine years ago; three years ago, he was informed that he was clear of pancreatic cancer. He is a current smoker.  Dr. Karilyn Cota performed endoscopy on him in 1/13, no acute bleeding / varices at that time.    Dr. Karilyn Cota is his GI specialist.  Dr. Loney Hering is PCP.   Past Medical History  Diagnosis Date  . Pancreatitis chronic 08/04/2010  . GERD (gastroesophageal reflux disease)   . Insomnia 08/04/2010  . Seizure 08/04/2010  . Chronic pain syndrome 08/04/2010  . Unspecified essential hypertension 08/04/2010  . Chronic pancreatitis 08/04/2010  . Anxiety states 08/04/2010  . Chronic airway obstruction, not elsewhere classified 08/04/2010  . Tobacco use disorder 08/04/2010  . Other abnormal glucose 08/04/2010   Past Surgical History  Procedure Laterality Date  . Jejunostomy feeding tube  07/13/2010  .  Pancreatic pseudocyst drainage  07/13/2010  . Eus  03/26/2009    [facility]  . Portacath placement  11/28/2008    [facility]  . Gastrostomy-jejeunostomy tube change/placement  12/11/2008    [facility]  . I&d extremity Left 09/12/2012    Procedure: IRRIGATION AND DEBRIDEMENT LEFT FIFTH FINGER;  Surgeon: Dominica Severin, MD;  Location: [facility] OR;  Service: Orthopedics;  Laterality: Left;  . Open reduction internal fixation (orif) metacarpal Left 09/12/2012    Procedure: OPEN REDUCTION INTERNAL FIXATION LEFT FIFTH FINGER;  Surgeon: Dominica Severin, MD;  Location: [facility] OR;  Service: Orthopedics;  Laterality: Left;  . Nerve and tendon repair Left 09/12/2012    Procedure: NERVE AND TENDON REPAIR LEFT FIFTH FINGER;  Surgeon: Dominica Severin, MD;  Location: [facility] OR;  Service: Orthopedics;  Laterality: Left;   No family history on file. History  Substance Use Topics  . Smoking status: Current Every Day Smoker -- 1.00 packs/day for 20 years    Types: Cigarettes  . Smokeless tobacco: Not on file     Comment: 1 pack a day since age 42  . Alcohol Use: No     Comment: No etoh since 5-6 yrs    Review of Systems  Constitutional: Positive for appetite change. Negative for fever.  Gastrointestinal: Positive for vomiting and abdominal pain.  Musculoskeletal: Positive for back pain.  Neurological: Positive for syncope.  All other systems reviewed and are negative.   Allergies  Cheese  Home Medications   No current outpatient prescriptions  on file. Triage Vitals: BP 154/85  Pulse 84  Temp(Src) 98.4 F (36.9 C) (Oral)  Resp 20  Ht 5\' 8"  (1.727 m)  SpO2 96%  Physical Exam  Nursing note and vitals reviewed. Constitutional: He is oriented to person, place, and time. He appears well-developed and well-nourished. No distress.  HENT:  Head: Normocephalic and atraumatic.  Whitish-yellow covering to surface of tongue.   Eyes: EOM are normal.  Neck: Neck supple. No tracheal deviation present.   Cardiovascular: Normal rate, regular rhythm, normal heart sounds and intact distal pulses.   No murmur heard. Pulmonary/Chest: Effort normal and breath sounds normal. No respiratory distress. He has no wheezes.  Genitourinary:  No lymphadenectomy to groin.  No hernias to groin.   Musculoskeletal: Normal range of motion.  Neurological: He is alert and oriented to person, place, and time.  Skin: Skin is warm and dry.  Psychiatric: He has a normal mood and affect. His behavior is normal.    ED Course  Procedures (including critical care time)  DIAGNOSTIC STUDIES: Oxygen Saturation is 96% on room air, normal by my interpretation.    COORDINATION OF CARE:  11:40 PM- Discussed treatment plan with patient, and the patient agreed to the plan.   Labs Review Labs Reviewed  CBC WITH DIFFERENTIAL - Abnormal; Notable for the following:    WBC 11.5 (*)    All other components within normal limits  COMPREHENSIVE METABOLIC PANEL - Abnormal; Notable for the following:    Sodium 133 (*)    Glucose, Bld 132 (*)    Albumin 3.0 (*)    All other components within normal limits  LIPASE, BLOOD - Abnormal; Notable for the following:    Lipase 100 (*)    All other components within normal limits  URINALYSIS, ROUTINE W REFLEX MICROSCOPIC - Abnormal; Notable for the following:    Specific Gravity, Urine <1.005 (*)    All other components within normal limits  CBC - Abnormal; Notable for the following:    RBC 4.17 (*)    All other components within normal limits  HEMOGLOBIN AND HEMATOCRIT, BLOOD - Abnormal; Notable for the following:    Hemoglobin 12.9 (*)    HCT 38.5 (*)    All other components within normal limits  PHENYTOIN LEVEL, TOTAL - Abnormal; Notable for the following:    Phenytoin Lvl 4.6 (*)    All other components within normal limits  URINE RAPID DRUG SCREEN (HOSP PERFORMED) - Abnormal; Notable for the following:    Opiates POSITIVE (*)    Benzodiazepines POSITIVE (*)    All  other components within normal limits  CBC - Abnormal; Notable for the following:    RBC 4.07 (*)    Hemoglobin 12.7 (*)    HCT 38.1 (*)    All other components within normal limits  COMPREHENSIVE METABOLIC PANEL - Abnormal; Notable for the following:    Creatinine, Ser 0.49 (*)    Albumin 2.6 (*)    All other components within normal limits  LIPASE, BLOOD - Abnormal; Notable for the following:    Lipase 108 (*)    All other components within normal limits  LACTIC ACID, PLASMA  BASIC METABOLIC PANEL  TROPONIN I  TROPONIN I  PROTIME-INR  ETHANOL  GLUCOSE, CAPILLARY  TROPONIN I  TROPONIN I  LIPASE, BLOOD  COMPREHENSIVE METABOLIC PANEL  CBC  TYPE AND SCREEN   Imaging Review Ct Abdomen Pelvis W Contrast  01/09/2013   CLINICAL DATA:  Abdominal pain, nausea, vomiting,  syncope. History of pancreatitis.  EXAM: CT ABDOMEN AND PELVIS WITH CONTRAST  TECHNIQUE: Multidetector CT imaging of the abdomen and pelvis was performed using the standard protocol following bolus administration of intravenous contrast.  CONTRAST:  50mL OMNIPAQUE IOHEXOL 300 MG/ML SOLN, OMNIPAQUE IOHEXOL 300 MG/ML SOLN  COMPARISON:  10/03/2012  FINDINGS: Mild infiltration or dependent atelectasis in the lung bases. Tiny pulmonary nodules measuring less than 2 mm diameter. Mild distal esophageal wall thickening may represent reflux or inflammatory process.  Calcified granulomas in the liver and spleen. Stable appearance of tiny cysts in the liver. Periportal edema. No bile duct dilatation. Gallbladder is normal. Pancreas demonstrates multiple calcifications consistent with chronic pancreatitis. Since the previous study, the perihepatic fluid collection has resolved and peripancreatic fluid collections adjacent to the body and tail have decreased in size. Upper abdominal fluid and infiltrative changes are decreasing. Changes are consistent with improving pancreatitis. Multiple lymph nodes are present in the upper abdomen  without pathologic enlargement. Splenic vein is tortuous and splenic varices appear to be present. 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 Gastrojejunostomy tube is in place. Stomach, small bowel, and colon are not abnormally distended. No free air in the abdomen. The kidneys, adrenal glands, abdominal aorta, inferior vena cava, and retroperitoneal lymph nodes are unremarkable.  Pelvis: Prostate gland is enlarged. Bladder wall is not thickened. No free or loculated pelvic fluid collections. Appendix is normal. No diverticulitis.  IMPRESSION: Improving inflammatory changes in the peripancreatic and upper abdominal regions consistent with improving acute pancreatitis. Small residual pseudo cysts are smaller. Calcification in the pancreas consistent with chronic pancreatitis. 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 No evidence of progression since previous study.   Electronically Signed   By: Burman Nieves M.D.   On: 01/09/2013 02:48   ED ECG REPORT  I personally interpreted this EKG   Date: 01/10/2013   Rate: 59  Rhythm: sinus bradycardia  QRS Axis: normal  Intervals: normal  ST/T Wave abnormalities: normal  Conduction Disutrbances:none  Narrative Interpretation:   Old EKG Reviewed: unchanged   MDM   1. Hematemesis   2. Syncope   3. Pancreatitis   4. Acute pancreatitis   5. Hypertension   6. Chronic pancreatitis   7. Seizure disorder    The pt has had ongoing pain and nausea - no more vomiting blood emesis - His VS are reassuring, labs show a leukocytosis, lipase of 100 and CT shows improving findings of his panreatitis / inflammatory changes - will d/w hospitalist given hematemesis, likely needs observation for this.  No further vomiting of blood in the emergency department.  Discussed with the hospitalist who will admit.  I personally performed the services described in this documentation, which was scribed in my presence. The recorded information has been reviewed  and is accurate.      Vida Roller, MD 01/10/13 [phone]

## 2013-01-09 DIAGNOSIS — K920 Hematemesis: Secondary | ICD-10-CM

## 2013-01-09 DIAGNOSIS — K859 Acute pancreatitis without necrosis or infection, unspecified: Secondary | ICD-10-CM | POA: Diagnosis present

## 2013-01-09 DIAGNOSIS — G40909 Epilepsy, unspecified, not intractable, without status epilepticus: Secondary | ICD-10-CM

## 2013-01-09 DIAGNOSIS — I10 Essential (primary) hypertension: Secondary | ICD-10-CM

## 2013-01-09 DIAGNOSIS — K861 Other chronic pancreatitis: Secondary | ICD-10-CM

## 2013-01-09 DIAGNOSIS — R55 Syncope and collapse: Secondary | ICD-10-CM | POA: Diagnosis present

## 2013-01-09 LAB — TROPONIN I
Troponin I: <0.3 ng/mL (ref <0.30)
Troponin I: <0.3 ng/mL (ref <0.30)

## 2013-01-09 LAB — GLUCOSE, CAPILLARY: Glucose-Capillary: 83 mg/dL (ref 70–99)

## 2013-01-09 LAB — COMPREHENSIVE METABOLIC PANEL
ALT: 14 U/L (ref 0–53)
Alkaline Phosphatase: 91 U/L (ref 39–117)
BUN: 6 mg/dL (ref 6–23)
BUN: 7 mg/dL (ref 6–23)
CO2: 23 mEq/L (ref 19–32)
Calcium: 8.5 mg/dL (ref 8.4–10.5)
Calcium: 9.4 mg/dL (ref 8.4–10.5)
Chloride: 105 mEq/L (ref 96–112)
Creatinine, Ser: 0.49 mg/dL — ABNORMAL LOW (ref 0.50–1.35)
Creatinine, Ser: 0.53 mg/dL (ref 0.50–1.35)
GFR calc Af Amer: >90 mL/min (ref >90)
GFR calc Af Amer: >90 mL/min (ref >90)
Glucose, Bld: 132 mg/dL — ABNORMAL HIGH (ref 70–99)
Glucose, Bld: 77 mg/dL (ref 70–99)
Potassium: 3.7 mEq/L (ref 3.5–5.1)
Sodium: 139 mEq/L (ref 135–145)
Total Bilirubin: 0.4 mg/dL (ref 0.3–1.2)
Total Protein: 6.9 g/dL (ref 6.0–8.3)
Total Protein: 7.8 g/dL (ref 6.0–8.3)

## 2013-01-09 LAB — PROTIME-INR: Prothrombin Time: 13.9 seconds (ref 11.6–15.2)

## 2013-01-09 LAB — URINALYSIS, ROUTINE W REFLEX MICROSCOPIC
Bilirubin Urine: NEGATIVE
Hgb urine dipstick: NEGATIVE
Protein, ur: NEGATIVE mg/dL
Specific Gravity, Urine: <1.005 — ABNORMAL LOW (ref 1.005–1.030)
Urobilinogen, UA: 0.2 mg/dL (ref 0.0–1.0)
pH: 6.5 (ref 5.0–8.0)

## 2013-01-09 LAB — CBC
Hemoglobin: 12.7 g/dL — ABNORMAL LOW (ref 13.0–17.0)
MCH: 31.4 pg (ref 26.0–34.0)
MCH: 31.2 pg (ref 26.0–34.0)
MCHC: 33.6 g/dL (ref 30.0–36.0)
MCHC: 33.3 g/dL (ref 30.0–36.0)
MCV: 93.5 fL (ref 78.0–100.0)
Platelets: 212 10*3/uL (ref 150–400)
RBC: 4.17 MIL/uL — ABNORMAL LOW (ref 4.22–5.81)
RBC: 4.07 MIL/uL — ABNORMAL LOW (ref 4.22–5.81)
RDW: 14.6 % (ref 11.5–15.5)

## 2013-01-09 LAB — LIPASE, BLOOD
Lipase: 100 U/L — ABNORMAL HIGH (ref 11–59)
Lipase: 108 U/L — ABNORMAL HIGH (ref 11–59)

## 2013-01-09 LAB — BASIC METABOLIC PANEL
BUN: 6 mg/dL (ref 6–23)
CO2: 24 mEq/L (ref 19–32)
Calcium: 8.6 mg/dL (ref 8.4–10.5)
Creatinine, Ser: 0.51 mg/dL (ref 0.50–1.35)
GFR calc non Af Amer: >90 mL/min (ref >90)
Sodium: 139 mEq/L (ref 135–145)

## 2013-01-09 LAB — CBC WITH DIFFERENTIAL/PLATELET
Eosinophils Absolute: 0.3 10*3/uL (ref 0.0–0.7)
Eosinophils Relative: 2 % (ref 0–5)
Hemoglobin: 13.7 g/dL (ref 13.0–17.0)
Lymphs Abs: 3.1 10*3/uL (ref 0.7–4.0)
MCH: 31.8 pg (ref 26.0–34.0)
MCHC: 34.3 g/dL (ref 30.0–36.0)
MCV: 92.6 fL (ref 78.0–100.0)
Monocytes Absolute: 0.8 10*3/uL (ref 0.1–1.0)
Monocytes Relative: 7 % (ref 3–12)
Neutro Abs: 7.4 10*3/uL (ref 1.7–7.7)
Neutrophils Relative %: 64 % (ref 43–77)
Platelets: 209 10*3/uL (ref 150–400)
RBC: 4.31 MIL/uL (ref 4.22–5.81)

## 2013-01-09 LAB — ETHANOL: Alcohol, Ethyl (B): <11 mg/dL (ref 0–11)

## 2013-01-09 LAB — TYPE AND SCREEN
ABO/RH(D): O POS
Antibody Screen: NEGATIVE

## 2013-01-09 LAB — RAPID URINE DRUG SCREEN, HOSP PERFORMED
Amphetamines: NOT DETECTED
Benzodiazepines: POSITIVE — AB
Opiates: POSITIVE — AB

## 2013-01-09 LAB — PHENYTOIN LEVEL, TOTAL: Phenytoin Lvl: 4.6 ug/mL — ABNORMAL LOW (ref 10.0–20.0)

## 2013-01-09 MED ORDER — PHENYTOIN SODIUM EXTENDED 100 MG PO CAPS
300.0000 mg | ORAL_CAPSULE | Freq: Four times a day (QID) | ORAL | Status: AC
  Administered 2013-01-09 (×2): 300 mg via ORAL
  Filled 2013-01-09 (×2): qty 3

## 2013-01-09 MED ORDER — IOHEXOL 300 MG/ML  SOLN
100.0000 mL | Freq: Once | INTRAMUSCULAR | Status: AC | PRN
  Administered 2013-01-09: 100 mL via INTRAVENOUS

## 2013-01-09 MED ORDER — VITAMIN B-12 1000 MCG PO TABS
1000.0000 ug | ORAL_TABLET | Freq: Every day | ORAL | Status: DC
  Administered 2013-01-09 – 2013-01-11 (×3): 1000 ug via ORAL
  Filled 2013-01-09 (×3): qty 1

## 2013-01-09 MED ORDER — ALBUTEROL SULFATE (2.5 MG/3ML) 0.083% IN NEBU: 2.5000 mg | INHALATION_SOLUTION | RESPIRATORY_TRACT | Status: DC | PRN

## 2013-01-09 MED ORDER — AMLODIPINE BESYLATE 5 MG PO TABS
10.0000 mg | ORAL_TABLET | Freq: Every day | ORAL | Status: DC
  Administered 2013-01-09 – 2013-01-11 (×3): 10 mg via ORAL
  Filled 2013-01-09 (×3): qty 2

## 2013-01-09 MED ORDER — VITAMIN D 1000 UNITS PO TABS
1000.0000 [IU] | ORAL_TABLET | Freq: Every day | ORAL | Status: DC
  Administered 2013-01-09 – 2013-01-11 (×3): 1000 [IU] via ORAL
  Filled 2013-01-09 (×3): qty 1

## 2013-01-09 MED ORDER — DIAZEPAM 5 MG PO TABS
10.0000 mg | ORAL_TABLET | Freq: Three times a day (TID) | ORAL | Status: DC | PRN
  Administered 2013-01-10: 10 mg via ORAL
  Filled 2013-01-09: qty 2

## 2013-01-09 MED ORDER — ACETAMINOPHEN 325 MG PO TABS
650.0000 mg | ORAL_TABLET | Freq: Four times a day (QID) | ORAL | Status: DC | PRN
  Administered 2013-01-10: 650 mg via ORAL
  Filled 2013-01-09: qty 2

## 2013-01-09 MED ORDER — HYDROCODONE-ACETAMINOPHEN 10-325 MG PO TABS
1.0000 | ORAL_TABLET | Freq: Four times a day (QID) | ORAL | Status: DC | PRN
  Administered 2013-01-09 – 2013-01-11 (×6): 1 via ORAL
  Filled 2013-01-09 (×6): qty 1

## 2013-01-09 MED ORDER — ONDANSETRON HCL 4 MG/2ML IJ SOLN: 4.0000 mg | Freq: Three times a day (TID) | INTRAMUSCULAR | Status: DC | PRN

## 2013-01-09 MED ORDER — ALUM & MAG HYDROXIDE-SIMETH 200-200-20 MG/5ML PO SUSP: 30.0000 mL | Freq: Four times a day (QID) | ORAL | Status: DC | PRN

## 2013-01-09 MED ORDER — AMITRIPTYLINE HCL 25 MG PO TABS
25.0000 mg | ORAL_TABLET | Freq: Every day | ORAL | Status: DC
  Administered 2013-01-09 – 2013-01-10 (×2): 25 mg via ORAL
  Filled 2013-01-09 (×2): qty 1

## 2013-01-09 MED ORDER — TRAZODONE HCL 50 MG PO TABS
200.0000 mg | ORAL_TABLET | Freq: Every day | ORAL | Status: DC
  Administered 2013-01-09 – 2013-01-10 (×2): 200 mg via ORAL
  Filled 2013-01-09 (×2): qty 4

## 2013-01-09 MED ORDER — ONDANSETRON HCL 4 MG/2ML IJ SOLN: 4.0000 mg | Freq: Four times a day (QID) | INTRAMUSCULAR | Status: DC | PRN

## 2013-01-09 MED ORDER — ONDANSETRON HCL 4 MG PO TABS: 4.0000 mg | ORAL_TABLET | Freq: Four times a day (QID) | ORAL | Status: DC | PRN

## 2013-01-09 MED ORDER — PANTOPRAZOLE SODIUM 40 MG IV SOLR
40.0000 mg | Freq: Every day | INTRAVENOUS | Status: DC
  Administered 2013-01-09 – 2013-01-10 (×2): 40 mg via INTRAVENOUS
  Filled 2013-01-09 (×3): qty 40

## 2013-01-09 MED ORDER — HYDROMORPHONE HCL PF 1 MG/ML IJ SOLN
1.0000 mg | Freq: Once | INTRAMUSCULAR | Status: AC
  Administered 2013-01-09: 1 mg via INTRAVENOUS
  Filled 2013-01-09: qty 1

## 2013-01-09 MED ORDER — FENTANYL CITRATE 0.05 MG/ML IJ SOLN
50.0000 ug | Freq: Once | INTRAMUSCULAR | Status: AC
  Administered 2013-01-09: 01:00:00 via INTRAVENOUS
  Filled 2013-01-09: qty 2

## 2013-01-09 MED ORDER — INFLUENZA VAC SPLIT QUAD 0.5 ML IM SUSP
0.5000 mL | INTRAMUSCULAR | Status: AC
  Administered 2013-01-10: 0.5 mL via INTRAMUSCULAR
  Filled 2013-01-09: qty 0.5

## 2013-01-09 MED ORDER — SODIUM CHLORIDE 0.9 % IV SOLN
INTRAVENOUS | Status: DC
  Administered 2013-01-09 – 2013-01-10 (×5): via INTRAVENOUS

## 2013-01-09 MED ORDER — PHENYTOIN SODIUM EXTENDED 100 MG PO CAPS
300.0000 mg | ORAL_CAPSULE | Freq: Every day | ORAL | Status: DC
  Administered 2013-01-10 – 2013-01-11 (×2): 300 mg via ORAL
  Filled 2013-01-09 (×2): qty 3

## 2013-01-09 MED ORDER — LOPERAMIDE HCL 2 MG PO CAPS: 2.0000 mg | ORAL_CAPSULE | ORAL | Status: DC | PRN

## 2013-01-09 MED ORDER — JEVITY 1.2 CAL PO LIQD
1000.0000 mL | ORAL | Status: DC
  Administered 2013-01-09: 16:00:00
  Administered 2013-01-10: 1000 mL
  Filled 2013-01-09 (×5): qty 1000

## 2013-01-09 MED ORDER — GABAPENTIN 400 MG PO CAPS
1200.0000 mg | ORAL_CAPSULE | Freq: Two times a day (BID) | ORAL | Status: DC
Start: 2013-01-09 — End: 2013-01-11
  Administered 2013-01-09 – 2013-01-11 (×5): 1200 mg via ORAL
  Filled 2013-01-09 (×5): qty 3

## 2013-01-09 MED ORDER — SODIUM CHLORIDE 0.9 % IJ SOLN: 3.0000 mL | Freq: Two times a day (BID) | INTRAMUSCULAR | Status: DC

## 2013-01-09 MED ORDER — IOHEXOL 300 MG/ML  SOLN
50.0000 mL | Freq: Once | INTRAMUSCULAR | Status: AC | PRN
  Administered 2013-01-09: 50 mL via ORAL

## 2013-01-09 MED ORDER — TIOTROPIUM BROMIDE MONOHYDRATE 18 MCG IN CAPS
18.0000 ug | ORAL_CAPSULE | Freq: Every day | RESPIRATORY_TRACT | Status: DC
  Administered 2013-01-09 – 2013-01-10 (×2): 18 ug via RESPIRATORY_TRACT
  Filled 2013-01-09: qty 5

## 2013-01-09 MED ORDER — MORPHINE SULFATE 2 MG/ML IJ SOLN
2.0000 mg | INTRAMUSCULAR | Status: DC | PRN
  Administered 2013-01-09 – 2013-01-10 (×6): 2 mg via INTRAVENOUS
  Filled 2013-01-09 (×7): qty 1

## 2013-01-09 MED ORDER — SODIUM CHLORIDE 0.9 % IJ SOLN
10.0000 mL | Freq: Two times a day (BID) | INTRAMUSCULAR | Status: DC
  Administered 2013-01-11: 10 mL via INTRAVENOUS

## 2013-01-09 MED ORDER — ACETAMINOPHEN 650 MG RE SUPP: 650.0000 mg | Freq: Four times a day (QID) | RECTAL | Status: DC | PRN

## 2013-01-09 MED ORDER — POTASSIUM CHLORIDE CRYS ER 10 MEQ PO TBCR
10.0000 meq | EXTENDED_RELEASE_TABLET | Freq: Every day | ORAL | Status: DC
  Administered 2013-01-09 – 2013-01-11 (×3): 10 meq via ORAL
  Filled 2013-01-09 (×3): qty 1

## 2013-01-09 MED ORDER — LOPERAMIDE HCL 2 MG PO CAPS
8.0000 mg | ORAL_CAPSULE | Freq: Every day | ORAL | Status: DC
  Filled 2013-01-09: qty 4

## 2013-01-09 MED ORDER — LORATADINE 10 MG PO TABS
10.0000 mg | ORAL_TABLET | Freq: Every day | ORAL | Status: DC
  Administered 2013-01-09 – 2013-01-11 (×3): 10 mg via ORAL
  Filled 2013-01-09 (×3): qty 1

## 2013-01-09 NOTE — Progress Notes (Signed)
Up arrival to room, pt stated that he did not care if he lived.  Pt. Was asked if he had thoughts of harming himself and others and he stated that he did not have these thoughts.  Pt. Stated he would never do anything to harm himself or others.    Pt. Stated that he was living at a "drug house" and that his wife was wanting time apart from him and that he now does not see his dogs, children, and home.    Pt.'s belongings were searched by security and a pocketknife was taken down to the safe.  Pt. Was made aware that he would get it back at discharge.    Patient stated he drinks at least 1 beer a day and that he is trying to stop smoking cigarettes.  Patient denied drug use.

## 2013-01-09 NOTE — Progress Notes (Signed)
UR completed 

## 2013-01-09 NOTE — Plan of Care (Signed)
Problem: Phase I Progression Outcomes Goal: OOB as tolerated unless otherwise ordered Outcome: Progressing 01/09/13 1103 patient up to chair with assist, ambulates in room with supervision. Instructed to call for assist and not get up on his own for safety. bedalarm on for safety, call light within reach. Earnstine Regal ,RN

## 2013-01-09 NOTE — H&P (Addendum)
PATIENT DETAILS Name: Marcus Brooks Age: 52 y.o. Sex: male Date of Birth: 1960-02-26 Admit Date: 01/08/2013 YNW:GNFAO, Marcus Perone, MD   CHIEF COMPLAINT:  Abdominal pain-2 days Hematemesis- one-day Syncope-one-day  HPI: Marcus Brooks is a 52 y.o. male with a Past Medical History of chronic alcoholic pancreatitis status post J-tube placement for nutrition, seizure disorder, chronic abdominal pain with chronic narcotic dependence, reported history of remote coronary artery disease who presents today with the above noted complaint. Per patient he was in his usual state of health approximately 2 days back when he started having severe left mid abdominal and periumbilical pain. Per patient, the pain radiates to his back. He describes the pain as sharp, associated with numerous episodes of vomiting. No particular aggravating factors, his usual narcotics have not relieved his pain. He claims that the last 3 times he vomited, the vomitus contained blood. His last episode of hematemesis was approximately 1:00 pm yesterday afternoon. He also claims that he syncopized 3 times yesterday. He claims that he passed out and did not have seizures. He presented to the ED with the above-noted complaints, I was asked to admit this patient for further evaluation and treatment. The patient claims that he has chronic diarrhea, he has no history of fresh blood per rectum or black stools. Patient also claims that over the Christmas holidays, he had one large can of beer. He claims apart from that one beer, he hasn't had a drink in years.   ALLERGIES:   Allergies  Allergen Reactions  . Cheese Hives    PAST MEDICAL HISTORY: Past Medical History  Diagnosis Date  . Pancreatitis chronic 08/04/2010  . GERD (gastroesophageal reflux disease)   . Insomnia 08/04/2010  . Seizure 08/04/2010  . Chronic pain syndrome 08/04/2010  . Unspecified essential hypertension 08/04/2010  . Chronic pancreatitis 08/04/2010  .  Anxiety states 08/04/2010  . Chronic airway obstruction, not elsewhere classified 08/04/2010  . Tobacco use disorder 08/04/2010  . Other abnormal glucose 08/04/2010    PAST SURGICAL HISTORY: Past Surgical History  Procedure Laterality Date  . Jejunostomy feeding tube  07/13/2010  . Pancreatic pseudocyst drainage  07/13/2010  . Eus  03/26/2009    [facility]  . Portacath placement  11/28/2008    [facility]  . Gastrostomy-jejeunostomy tube change/placement  12/11/2008    [facility]  . I&d extremity Left 09/12/2012    Procedure: IRRIGATION AND DEBRIDEMENT LEFT FIFTH FINGER;  Surgeon: Dominica Severin, MD;  Location: [facility] OR;  Service: Orthopedics;  Laterality: Left;  . Open reduction internal fixation (orif) metacarpal Left 09/12/2012    Procedure: OPEN REDUCTION INTERNAL FIXATION LEFT FIFTH FINGER;  Surgeon: Dominica Severin, MD;  Location: [facility] OR;  Service: Orthopedics;  Laterality: Left;  . Nerve and tendon repair Left 09/12/2012    Procedure: NERVE AND TENDON REPAIR LEFT FIFTH FINGER;  Surgeon: Dominica Severin, MD;  Location: [facility] OR;  Service: Orthopedics;  Laterality: Left;    MEDICATIONS AT HOME: Prior to Admission medications   Medication Sig Start Date End Date Taking? Authorizing Provider  albuterol (PROVENTIL HFA;VENTOLIN HFA) 108 (90 BASE) MCG/ACT inhaler Inhale 2 puffs into the lungs every 6 (six) hours as needed for wheezing or shortness of breath.     Historical Provider, MD  amitriptyline (ELAVIL) 25 MG tablet Take 25 mg by mouth at bedtime.      Historical Provider, MD  amLODipine (NORVASC) 10 MG tablet Take 10 mg by mouth daily.      Historical Provider, MD  cholecalciferol (  VITAMIN D) 1000 UNITS tablet Take 1,000 Units by mouth daily.      Historical Provider, MD  diazepam (VALIUM) 10 MG tablet Take 10 mg by mouth 3 (three) times daily as needed for anxiety.    Historical Provider, MD  gabapentin (NEURONTIN) 300 MG capsule Take 1,200 mg by mouth 2 (two) times daily.    Historical  Provider, MD  HYDROcodone-acetaminophen (NORCO) 10-325 MG per tablet Take 1 tablet by mouth every 6 (six) hours as needed for pain (hold if lethargic).    Historical Provider, MD  loperamide (IMODIUM) 2 MG capsule Take 8 mg by mouth daily.    Historical Provider, MD  loratadine (CLARITIN) 10 MG tablet Take 10 mg by mouth daily.      Historical Provider, MD  omeprazole (PRILOSEC) 20 MG capsule Take 20 mg by mouth 2 (two) times daily.      Historical Provider, MD  pantoprazole (PROTONIX) 40 MG tablet Take 40 mg by mouth daily.    Historical Provider, MD  phenytoin (DILANTIN) 100 MG ER capsule Take 300 mg by mouth daily.     Historical Provider, MD  potassium chloride (KLOR-CON) 10 MEQ CR tablet Take 10 mEq by mouth daily.      Historical Provider, MD  tiotropium (SPIRIVA) 18 MCG inhalation capsule Place 18 mcg into inhaler and inhale daily.      Historical Provider, MD  traZODone (DESYREL) 100 MG tablet Take 200 mg by mouth at bedtime.     Historical Provider, MD  vitamin B-12 (CYANOCOBALAMIN) 1000 MCG tablet Take 1,000 mcg by mouth daily.    Historical Provider, MD    FAMILY HISTORY: No family history of coronary artery disease.  SOCIAL HISTORY:  reports that he has been smoking Cigarettes.  He has a 20 pack-year smoking history. He does not have any smokeless tobacco history on file. He reports that he does not drink alcohol or use illicit drugs.  REVIEW OF SYSTEMS:  Constitutional:   No  weight loss, night sweats,  Fevers, chills, fatigue.  HEENT:    No headaches, Difficulty swallowing,Tooth/dental problems,Sore throat,  No sneezing, itching, ear ache, nasal congestion, post nasal drip,   Cardio-vascular: No chest pain,  Orthopnea, PND, swelling in lower extremities, anasarca,   dizziness, palpitations  GI:  No heartburn, indigestion  Resp: No shortness of breath with exertion or at rest.  No excess mucus, no productive cough, No non-productive cough,  No coughing up of blood.No  change in color of mucus.No wheezing.No chest wall deformity  Skin:  no rash or lesions.  GU:  no dysuria, change in color of urine, no urgency or frequency.  No flank pain.  Musculoskeletal: No joint pain or swelling.  No decreased range of motion.  No back pain.  Psych: No change in mood or affect. No depression or anxiety.  No memory loss.   PHYSICAL EXAM: Blood pressure 154/85, pulse 84, temperature 98.4 F (36.9 C), temperature source Oral, resp. rate 20, height 5\' 8"  (1.727 m), weight 0 kg (0 lb), SpO2 96.00%.  General appearance :Awake, alert, not in any distress. Speech Clear. Not toxic Looking HEENT: Atraumatic and Normocephalic, pupils equally reactive to light and accomodation Neck: supple, no JVD. No cervical lymphadenopathy.  Chest:Good air entry bilaterally, no added sounds  CVS: S1 S2 regular, no murmurs.  Abdomen: Bowel sounds present, tender in the left mid abdominal area extending to the infraumbilical area. However belly is soft. Minimal erythema without discharge seen in the J-tube area. Extremities:  B/L Lower Ext shows no edema, both legs are warm to touch Neurology: Awake alert, and oriented X 3, CN II-XII intact, Non focal Skin:No Rash Wounds:N/A  LABS ON ADMISSION:   Recent Labs  01/09/13 0011  NA 133*  K 3.7  CL 97  CO2 26  GLUCOSE 132*  BUN 7  CREATININE 0.53  CALCIUM 9.4    Recent Labs  01/09/13 0011  AST 15  ALT 14  ALKPHOS 91  BILITOT 0.3  PROT 7.8  ALBUMIN 3.0*    Recent Labs  01/09/13 0011  LIPASE 100*    Recent Labs  01/09/13 0011  WBC 11.5*  NEUTROABS 7.4  HGB 13.7  HCT 39.9  MCV 92.6  PLT 209   No results found for this basename: CKTOTAL, CKMB, CKMBINDEX, TROPONINI,  in the last 72 hours No results found for this basename: DDIMER,  in the last 72 hours No components found with this basename: POCBNP,    RADIOLOGIC STUDIES ON ADMISSION: Ct Abdomen Pelvis W Contrast  01/09/2013   CLINICAL DATA:  Abdominal  pain, nausea, vomiting, syncope. History of pancreatitis.  EXAM: CT ABDOMEN AND PELVIS WITH CONTRAST  TECHNIQUE: Multidetector CT imaging of the abdomen and pelvis was performed using the standard protocol following bolus administration of intravenous contrast.  CONTRAST:  50mL OMNIPAQUE IOHEXOL 300 MG/ML SOLN, OMNIPAQUE IOHEXOL 300 MG/ML SOLN  COMPARISON:  10/03/2012  FINDINGS: Mild infiltration or dependent atelectasis in the lung bases. Tiny pulmonary nodules measuring less than 2 mm diameter. Mild distal esophageal wall thickening may represent reflux or inflammatory process.  Calcified granulomas in the liver and spleen. Stable appearance of tiny cysts in the liver. Periportal edema. No bile duct dilatation. Gallbladder is normal. Pancreas demonstrates multiple calcifications consistent with chronic pancreatitis. Since the previous study, the perihepatic fluid collection has resolved and peripancreatic fluid collections adjacent to the body and tail have decreased in size. Upper abdominal fluid and infiltrative changes are decreasing. Changes are consistent with improving pancreatitis. Multiple lymph nodes are present in the upper abdomen without pathologic enlargement. Splenic vein is tortuous and splenic varices appear to be present. 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 Gastrojejunostomy tube is in place. Stomach, small bowel, and colon are not abnormally distended. No free air in the abdomen. The kidneys, adrenal glands, abdominal aorta, inferior vena cava, and retroperitoneal lymph nodes are unremarkable.  Pelvis: Prostate gland is enlarged. Bladder wall is not thickened. No free or loculated pelvic fluid collections. Appendix is normal. No diverticulitis.  IMPRESSION: Improving inflammatory changes in the peripancreatic and upper abdominal regions consistent with improving acute pancreatitis. Small residual pseudo cysts are smaller. Calcification in the pancreas consistent with chronic pancreatitis.  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 No evidence of progression since previous study.   Electronically Signed   By: Burman Nieves M.D.   On: 01/09/2013 02:48     EKG: Independently reviewed. Sinus bradycardia  ASSESSMENT AND PLAN: Present on Admission:  . Hematemesis -? Mallory-Weiss tear given history of repeated episodes of vomiting.  - Hemoglobin within normal limits. Fortunately no further hematemesis since 1 PM yesterday.  - Start PPI, keep n.p.o. till seen by gastroenterology, consult GI for possible EGD.   Marland Kitchen Pancreatitis, acute on chronic  - CT scan of the abdomen done in the emergency room shows improvement of his pancreatitis and pancreatic cysts compared to his CT scan done in September of this year. He could have had an acute flare from recent alcohol use.  -  Place on IV fluids, n.p.o. for now, as needed narcotics and antiemetics.  - GI consultation pending.  . Syncope -? Vasovagal response from severe abdominal pain. However also has a history of seizures, per patient he did not have seizures at home- but just "passed out". Unfortunately patients spouse not at bedside.  - Patient gives a vague history of reported but remote CAD- therefore we'll monitor in telemetry, cycle cardiac enzymes and order echocardiogram  . GERD (gastroesophageal reflux disease) - PPI  . Hypertension - Continue with amlodipine   . Seizure disorder - Will check a Dilantin level  - Continue Dilantin   . 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 - Will defer to gastroenterology regarding management and need for possible anticoagulation. Given history of given the reported history of hematemesis, and suspicion for this to be a chronic finding, we will defer anticoagulation for now.  Further plan will depend as patient's clinical course evolves and further radiologic and laboratory data become available. Patient will be monitored closely.   Above  noted plan was discussed with patient, he was in agreement.   DVT Prophylaxis: SCD's  Code Status: Full Code  Total time spent for admission equals 45 minutes.  [facility] Pager [phone]  If 7PM-7AM, please contact night-coverage [email or web address] Password [email or web address] 01/09/2013, 4:09 AM

## 2013-01-09 NOTE — Progress Notes (Signed)
01/09/13 1754 Late entry for 1700. Order placed for 2D echo, notified Dr. Irene Limbo, echo not completed today. Stated okay to be completed tomorrow. Earnstine Regal, RN

## 2013-01-09 NOTE — Progress Notes (Signed)
[facility] PROGRESS NOTE  SAMWISE ECKARDT OZH:[id number] DOB: 11-23-1960 DOA: 01/08/2013 PCP: Ernestine Conrad, MD Dr. Karilyn Cota is his GI specialist  Assessment/Plan: 1. Hematemesis, apparently resolved. No recurrence. Hemoglobin stable. History of EGD 08/2012 revealing gastritis and esophagitis, no varices. If recurs, consider EGD here. 2. Syncope. Possibly vasovagal from severe abdominal pain. No history to suggest seizure. Telemetry, cardiac enzymes, echocardiogram. 3. 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 Clinically appears stable, hemodynamically stable, patient actually hungry. CT demonstrated improvement over September 2014. Likely secondary to relapse on alcohol use. His abdominal exam is benign. 4. Chronic pain syndrome 5. Smoker: Recommend cessation 6. Alcohol abuse, recommend strict abstinence 7. Seizure disorder. Adjust Dilantin. 8. Jejunostomy feeding tube dependent, okay to resume per gastroenterology 9. 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 Margo Common is a chronic finding and no further treatment is warranted.   Repeat lipase, CMP, CBC in the morning  Continue n.p.o., consider advancing diet tomorrow  Begin Jevity  Dilantin load 300mg  po q6h x2 extra doses    Pending studies:   none  Code Status: full code DVT prophylaxis: SCDs Family Communication: none Disposition Plan: Home when improved  Brendia Sacks, MD  [facility]  Pager [phone] If 7PM-7AM, please contact night-coverage at [email or web address], password [facility] 01/09/2013, 9:55 AM  LOS: 1 day   Summary: 52 year old male with history of alcoholic pancreatitis, chronic, status post G-tube placement for nutrition, pancreatic cancer?, Chronic abdominal pain, chronic narcotic dependence presented with abdominal pain, hematemesis and syncope. He was admitted for hematemesis, GI consultation, acute on chronic pancreatitis,  syncope.  Consultants:  Gastroenterology  Procedures:    Antibiotics:    HPI/Subjective: Complains of abdominal pain but very hungry, wants a cookie to eat. No nausea or vomiting.  Objective: Filed Vitals:   01/08/13 2223 01/09/13 0420 01/09/13 0437 01/09/13 0509  BP: 154/85 124/80 136/79   Pulse: 84 59 107   Temp: 98.4 F (36.9 C) 97.4 F (36.3 C) 97.4 F (36.3 C)   TempSrc: Oral Oral Oral   Resp: 20 16 18    Height: 5\' 8"  (1.727 m)  5\' 8"  (1.727 m)   Weight:   55.566 kg (122 lb 8 oz) 55.792 kg (123 lb)  SpO2: 96% 95% 95%     Intake/Output Summary (Last 24 hours) at 01/09/13 0955 Last data filed at 01/09/13 0800  Gross per 24 hour  Intake      0 ml  Output      0 ml  Net      0 ml     Filed Weights   01/09/13 0437 01/09/13 0509  Weight: 55.566 kg (122 lb 8 oz) 55.792 kg (123 lb)    Exam:   Afebrile, vital signs stable, tachycardia. No hypoxia.  General: Appears calm and comfortable. Nontoxic.  Cardiovascular: Regular rate and rhythm. No murmur, rub or gallop.  Respiratory: Clear to auscultation bilaterally. No wheezes, rales or rhonchi. Normal respiratory effort.  Abdomen: Soft, nontender. Feeding tube in place.  Data Reviewed:  Basic metabolic panel unremarkable.  Lipase 100 on admission. LFTs unremarkable.  Troponin negative thus far.  Lactic acid normal.  Leukocytosis has resolved. Hemoglobin stable at 13.1.  Alcohol level was negative on admission. Urine drug screen positive for benzodiazepines and opiates.  Urinalysis negative.  CT of the abdomen and pelvis showed improving inflammatory changes in the peripancreatic and upper abdominal regions consistent with improving acute pancreatitis. Small residual pseudocysts are smaller. Chronic pancreatitis. Splenic vein varices.  Scheduled  Meds: . amitriptyline  25 mg Oral QHS  . amLODipine  10 mg Oral Daily  . cholecalciferol  1,000 Units Oral Daily  . gabapentin  1,200 mg Oral BID  .  [START ON 01/10/2013] influenza vac split quadrivalent PF  0.5 mL Intramuscular Tomorrow-1000  . loperamide  8 mg Oral Daily  . loratadine  10 mg Oral Daily  . pantoprazole (PROTONIX) IV  40 mg Intravenous Daily  . phenytoin  300 mg Oral Daily  . potassium chloride  10 mEq Oral Daily  . sodium chloride  10 mL Intravenous Q12H  . tiotropium  18 mcg Inhalation Daily  . traZODone  200 mg Oral QHS  . vitamin B-12  1,000 mcg Oral Daily   Continuous Infusions: . sodium chloride 125 mL/hr at 01/09/13 4098    Principal Problem:   Pancreatitis, acute Active Problems:   Hypertension   GERD (gastroesophageal reflux disease)   Hematemesis   Seizure disorder   Syncope   Time spent 30 minutes

## 2013-01-09 NOTE — Consult Note (Signed)
Reason for Consult: abdominal pain Referring Physician: Hospitalist  Marcus Brooks is an 52 y.o. male.  HPI:  Admitted thru the ED last night with abdominal pain. Hx pain started about 2 days ago. Christmas day he drank one 16 ounce beer. Afterwards he began to have abdominal pain. He is receiving pain medication every 4 hrs.  He says he has lost from 160 to 120.  He says he has lost weight because he lost his food stamps. He has been using his feeding supplement three times a day thru his feeding tube. He has epigastric pain radiating into his back.  11/05/2010 Weight 145.  This admission weight is 122. He has chronic diarrhea. He denies having black stools. He usually has BM x 1 a day.  He has not had po intake in over a week and a half. When he does eat he is on a low fat diet. He also says he vomited blood 2 days ago but none since.  Hx of chronic pancreatitis.  He tells me Dr. Loney Hering (PCP) has taken him off all of his pain medications. He was taking Hydrocodone and Valium). He underwent a CT 01/08/2013: Improving inflammatory changes in the peripancreatic and upper  abdominal regions consistent with improving acute pancreatitis.  Small residual pseudo cysts are smaller. Calcification in the  pancreas consistent with chronic pancreatitis. 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 No evidence of progression  since previous study.  Amylase elevated 100.          01/29/2011 EGD:  Hx of upper GI bleed. Impression:  Small sliding-type hernia otherwise normal EGD.  PEG/PEJ in place. J-tube advanced into duodenum.  Two small polyps snared. One from transverse colon was retrieved with the second one from sigmoid colon was lost.  Two small polyps were coagulated; one was at distal sigmoid colon and the second one was at rectum.   Past Medical History  Diagnosis Date  . Pancreatitis chronic 08/04/2010  . GERD (gastroesophageal reflux disease)   . Insomnia 08/04/2010  .  Seizure 08/04/2010  . Chronic pain syndrome 08/04/2010  . Unspecified essential hypertension 08/04/2010  . Chronic pancreatitis 08/04/2010  . Anxiety states 08/04/2010  . Chronic airway obstruction, not elsewhere classified 08/04/2010  . Tobacco use disorder 08/04/2010  . Other abnormal glucose 08/04/2010    Past Surgical History  Procedure Laterality Date  . Jejunostomy feeding tube  07/13/2010  . Pancreatic pseudocyst drainage  07/13/2010  . Eus  03/26/2009    [facility]  . Portacath placement  11/28/2008    [facility]  . Gastrostomy-jejeunostomy tube change/placement  12/11/2008    [facility]  . I&d extremity Left 09/12/2012    Procedure: IRRIGATION AND DEBRIDEMENT LEFT FIFTH FINGER;  Surgeon: Dominica Severin, MD;  Location: [facility] OR;  Service: Orthopedics;  Laterality: Left;  . Open reduction internal fixation (orif) metacarpal Left 09/12/2012    Procedure: OPEN REDUCTION INTERNAL FIXATION LEFT FIFTH FINGER;  Surgeon: Dominica Severin, MD;  Location: [facility] OR;  Service: Orthopedics;  Laterality: Left;  . Nerve and tendon repair Left 09/12/2012    Procedure: NERVE AND TENDON REPAIR LEFT FIFTH FINGER;  Surgeon: Dominica Severin, MD;  Location: [facility] OR;  Service: Orthopedics;  Laterality: Left;    No family history on file.  Social History:  reports that he has been smoking Cigarettes.  He has a 20 pack-year smoking history. He does not have any smokeless tobacco history on file. He reports that he does not drink alcohol  or use illicit drugs.  Allergies:  Allergies  Allergen Reactions  . Cheese Hives    Medications: I have reviewed the patient's current medications.  Results for orders placed during the hospital encounter of 01/08/13 (from the past 48 hour(s))  CBC WITH DIFFERENTIAL     Status: Abnormal   Collection Time    01/09/13 12:11 AM      Result Value Range   WBC 11.5 (*) 4.0 - 10.5 K/uL   RBC 4.31  4.22 - 5.81 MIL/uL   Hemoglobin 13.7  13.0 - 17.0 g/dL   HCT 41.3  24.4 -  01.0 %   MCV 92.6  78.0 - 100.0 fL   MCH 31.8  26.0 - 34.0 pg   MCHC 34.3  30.0 - 36.0 g/dL   RDW 27.2  53.6 - 64.4 %   Platelets 209  150 - 400 K/uL   Neutrophils Relative % 64  43 - 77 %   Neutro Abs 7.4  1.7 - 7.7 K/uL   Lymphocytes Relative 27  12 - 46 %   Lymphs Abs 3.1  0.7 - 4.0 K/uL   Monocytes Relative 7  3 - 12 %   Monocytes Absolute 0.8  0.1 - 1.0 K/uL   Eosinophils Relative 2  0 - 5 %   Eosinophils Absolute 0.3  0.0 - 0.7 K/uL   Basophils Relative 1  0 - 1 %   Basophils Absolute 0.1  0.0 - 0.1 K/uL  COMPREHENSIVE METABOLIC PANEL     Status: Abnormal   Collection Time    01/09/13 12:11 AM      Result Value Range   Sodium 133 (*) 135 - 145 mEq/L   Potassium 3.7  3.5 - 5.1 mEq/L   Chloride 97  96 - 112 mEq/L   CO2 26  19 - 32 mEq/L   Glucose, Bld 132 (*) 70 - 99 mg/dL   BUN 7  6 - 23 mg/dL   Creatinine, Ser 0.34  0.50 - 1.35 mg/dL   Calcium 9.4  8.4 - 74.2 mg/dL   Total Protein 7.8  6.0 - 8.3 g/dL   Albumin 3.0 (*) 3.5 - 5.2 g/dL   AST 15  0 - 37 U/L   ALT 14  0 - 53 U/L   Alkaline Phosphatase 91  39 - 117 U/L   Total Bilirubin 0.3  0.3 - 1.2 mg/dL   GFR calc non Af Amer >90  >90 mL/min   GFR calc Af Amer >90  >90 mL/min   Comment: (NOTE)     The eGFR has been calculated using the CKD EPI equation.     This calculation has not been validated in all clinical situations.     eGFR's persistently <90 mL/min signify possible Chronic Kidney     Disease.  LIPASE, BLOOD     Status: Abnormal   Collection Time    01/09/13 12:11 AM      Result Value Range   Lipase 100 (*) 11 - 59 U/L  LACTIC ACID, PLASMA     Status: None   Collection Time    01/09/13 12:12 AM      Result Value Range   Lactic Acid, Venous 0.6  0.5 - 2.2 mmol/L  TYPE AND SCREEN     Status: None   Collection Time    01/09/13 12:12 AM      Result Value Range   ABO/RH(D) O POS     Antibody Screen NEG  Sample Expiration 01/12/2013    URINALYSIS, ROUTINE W REFLEX MICROSCOPIC     Status: Abnormal    Collection Time    01/09/13  2:41 AM      Result Value Range   Color, Urine YELLOW  YELLOW   APPearance CLEAR  CLEAR   Specific Gravity, Urine <1.005 (*) 1.005 - 1.030   pH 6.5  5.0 - 8.0   Glucose, UA NEGATIVE  NEGATIVE mg/dL   Hgb urine dipstick NEGATIVE  NEGATIVE   Bilirubin Urine NEGATIVE  NEGATIVE   Ketones, ur NEGATIVE  NEGATIVE mg/dL   Protein, ur NEGATIVE  NEGATIVE mg/dL   Urobilinogen, UA 0.2  0.0 - 1.0 mg/dL   Nitrite NEGATIVE  NEGATIVE   Leukocytes, UA NEGATIVE  NEGATIVE   Comment: MICROSCOPIC NOT DONE ON URINES WITH NEGATIVE PROTEIN, BLOOD, LEUKOCYTES, NITRITE, OR GLUCOSE <1000 mg/dL.  CBC     Status: Abnormal   Collection Time    01/09/13  5:21 AM      Result Value Range   WBC 9.0  4.0 - 10.5 K/uL   RBC 4.17 (*) 4.22 - 5.81 MIL/uL   Hemoglobin 13.1  13.0 - 17.0 g/dL   HCT 82.9  56.2 - 13.0 %   MCV 93.5  78.0 - 100.0 fL   MCH 31.4  26.0 - 34.0 pg   MCHC 33.6  30.0 - 36.0 g/dL   RDW 86.5  78.4 - 69.6 %   Platelets 212  150 - 400 K/uL  BASIC METABOLIC PANEL     Status: None   Collection Time    01/09/13  5:21 AM      Result Value Range   Sodium 139  135 - 145 mEq/L   Potassium 3.8  3.5 - 5.1 mEq/L   Chloride 104  96 - 112 mEq/L   CO2 24  19 - 32 mEq/L   Glucose, Bld 88  70 - 99 mg/dL   BUN 6  6 - 23 mg/dL   Creatinine, Ser 2.95  0.50 - 1.35 mg/dL   Calcium 8.6  8.4 - 28.4 mg/dL   GFR calc non Af Amer >90  >90 mL/min   GFR calc Af Amer >90  >90 mL/min   Comment: (NOTE)     The eGFR has been calculated using the CKD EPI equation.     This calculation has not been validated in all clinical situations.     eGFR's persistently <90 mL/min signify possible Chronic Kidney     Disease.  TROPONIN I     Status: None   Collection Time    01/09/13  5:21 AM      Result Value Range   Troponin I <0.30  <0.30 ng/mL   Comment:            Due to the release kinetics of cTnI,     a negative result within the first hours     of the onset of symptoms does not rule out      myocardial infarction with certainty.     If myocardial infarction is still suspected,     repeat the test at appropriate intervals.  PROTIME-INR     Status: None   Collection Time    01/09/13  5:21 AM      Result Value Range   Prothrombin Time 13.9  11.6 - 15.2 seconds   INR 1.09  0.00 - 1.49  PHENYTOIN LEVEL, TOTAL     Status: Abnormal   Collection Time  01/09/13  5:21 AM      Result Value Range   Phenytoin Lvl 4.6 (*) 10.0 - 20.0 ug/mL  ETHANOL     Status: None   Collection Time    01/09/13  5:21 AM      Result Value Range   Alcohol, Ethyl (B) <11  0 - 11 mg/dL   Comment:            LOWEST DETECTABLE LIMIT FOR     SERUM ALCOHOL IS 11 mg/dL     FOR MEDICAL PURPOSES ONLY  GLUCOSE, CAPILLARY     Status: None   Collection Time    01/09/13  7:37 AM      Result Value Range   Glucose-Capillary 83  70 - 99 mg/dL   Comment 1 Documented in Chart     Comment 2 Notify RN    URINE RAPID DRUG SCREEN (HOSP PERFORMED)     Status: Abnormal   Collection Time    01/09/13  7:51 AM      Result Value Range   Opiates POSITIVE (*) NONE DETECTED   Cocaine NONE DETECTED  NONE DETECTED   Benzodiazepines POSITIVE (*) NONE DETECTED   Amphetamines NONE DETECTED  NONE DETECTED   Tetrahydrocannabinol NONE DETECTED  NONE DETECTED   Barbiturates NONE DETECTED  NONE DETECTED   Comment:            DRUG SCREEN FOR MEDICAL PURPOSES     ONLY.  IF CONFIRMATION IS NEEDED     FOR ANY PURPOSE, NOTIFY LAB     WITHIN 5 DAYS.                LOWEST DETECTABLE LIMITS     FOR URINE DRUG SCREEN     Drug Class       Cutoff (ng/mL)     Amphetamine      1000     Barbiturate      200     Benzodiazepine   200     Tricyclics       300     Opiates          300     Cocaine          300     THC              50    Ct Abdomen Pelvis W Contrast  01/09/2013   CLINICAL DATA:  Abdominal pain, nausea, vomiting, syncope. History of pancreatitis.  EXAM: CT ABDOMEN AND PELVIS WITH CONTRAST  TECHNIQUE: Multidetector CT  imaging of the abdomen and pelvis was performed using the standard protocol following bolus administration of intravenous contrast.  CONTRAST:  50mL OMNIPAQUE IOHEXOL 300 MG/ML SOLN, OMNIPAQUE IOHEXOL 300 MG/ML SOLN  COMPARISON:  10/03/2012  FINDINGS: Mild infiltration or dependent atelectasis in the lung bases. Tiny pulmonary nodules measuring less than 2 mm diameter. Mild distal esophageal wall thickening may represent reflux or inflammatory process.  Calcified granulomas in the liver and spleen. Stable appearance of tiny cysts in the liver. Periportal edema. No bile duct dilatation. Gallbladder is normal. Pancreas demonstrates multiple calcifications consistent with chronic pancreatitis. Since the previous study, the perihepatic fluid collection has resolved and peripancreatic fluid collections adjacent to the body and tail have decreased in size. Upper abdominal fluid and infiltrative changes are decreasing. Changes are consistent with improving pancreatitis. Multiple lymph nodes are present in the upper abdomen without pathologic enlargement. Splenic vein is tortuous and splenic varices appear to be  present. 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 Gastrojejunostomy tube is in place. Stomach, small bowel, and colon are not abnormally distended. No free air in the abdomen. The kidneys, adrenal glands, abdominal aorta, inferior vena cava, and retroperitoneal lymph nodes are unremarkable.  Pelvis: Prostate gland is enlarged. Bladder wall is not thickened. No free or loculated pelvic fluid collections. Appendix is normal. No diverticulitis.  IMPRESSION: Improving inflammatory changes in the peripancreatic and upper abdominal regions consistent with improving acute pancreatitis. Small residual pseudo cysts are smaller. Calcification in the pancreas consistent with chronic pancreatitis. 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 No evidence of progression since previous study.   Electronically Signed    By: Burman Nieves M.D.   On: 01/09/2013 02:48    ROS Blood pressure 136/79, pulse 107, temperature 97.4 F (36.3 C), temperature source Oral, resp. rate 18, height 5\' 8"  (1.727 m), weight 123 lb (55.792 kg), SpO2 95.00%. Physical Exam Alert and oriented. Skin warm and dry. Oral mucosa is moist.   . Sclera anicteric, conjunctivae is pink. Thyroid not enlarged. No cervical lymphadenopathy. Lungs clear. Heart regular rate and rhythm.  Abdomen is soft. Bowel sounds are positive. No hepatomegaly. No abdominal masses felt.Epigastric tenderness.  Rates pain 9/10.   No edema to lower extremities.    Assessment/Plan: Hx of chronic pancreatitis.  CT revealed inproving inflammatory changes in the peripancreatic and upper abdominal regions consistent with improving acute pancreatitis. Patient is presently NPO.  Hematemesis; no vomiting x 2 days.  He has maintained his hemoglobin.   I will discuss with Dr. Karilyn Cota.   SETZER,TERRI W 01/09/2013, 8:34 AM   GI attending note. Patient interviewed and examined. Current and prior CT is reviewed along with EGD report from [facility](August 26, 2012) which is scanned under media. GI issues are; #1. 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 Patient does not appear to be acutely ill. Current CT shows decrease in size of pseudocyst and less inflammatory changes compared to study of September 2014. He unfortunately is drinking alcohol intermittently. He complains of moderate to severe pain and on abdominal exam he has mild to moderate midepigastric tenderness. He will need to be n.p.o. until his pain has resolved or is reported to be mild. Tip of enteral tube is in proximal jejunum past the duodenojejunal flexure. He could therefore be fed without risk of making his pancreatitis worse. #2. He gives history of hematemesis 5 days ago. H&H is normal. He had EGD by Dr. Leota Jacobsen on 08/26/2012 revealing gastritis and esophagitis but no evidence of varices. If he  has evidence of melena or hematemesis he will need to be restudied. #3. 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 No indication for anticoagulations for this diagnosis.  Recommendations; Serum lipase level will be checked in a.m. Continue n.p.o. status except by mouth meds. Begin Jevity 1.2 at 35 mL per hour while gastro-jejunal feeding tube. If this tube becomes clogged will get interventional radiology to replace it.   Dr. Darrick Penna and associated will be seeing patient due to my absence starting on 01/11/2023

## 2013-01-09 NOTE — Progress Notes (Signed)
01/09/13 1715 Patient requested bedalarm and chair alarm not be used. Stated "i want the alarm off, i can get up". Discussed high fall risk and need for bedalarm/chair alarm for safety. Instructed to call for assist, call light kept within reach. Reinforced fall prevention/safety plan with patient as needed. Earnstine Regal, RN

## 2013-01-10 DIAGNOSIS — K859 Acute pancreatitis without necrosis or infection, unspecified: Secondary | ICD-10-CM

## 2013-01-10 DIAGNOSIS — R55 Syncope and collapse: Secondary | ICD-10-CM

## 2013-01-10 DIAGNOSIS — E43 Unspecified severe protein-calorie malnutrition: Secondary | ICD-10-CM | POA: Insufficient documentation

## 2013-01-10 LAB — COMPREHENSIVE METABOLIC PANEL
ALT: 13 U/L (ref 0–53)
AST: 16 U/L (ref 0–37)
Albumin: 2.7 g/dL — ABNORMAL LOW (ref 3.5–5.2)
Alkaline Phosphatase: 95 U/L (ref 39–117)
CO2: 24 mEq/L (ref 19–32)
Potassium: 3.8 mEq/L (ref 3.7–5.3)
Sodium: 137 mEq/L (ref 137–147)
Total Protein: 6.9 g/dL (ref 6.0–8.3)

## 2013-01-10 LAB — LIPASE, BLOOD: Lipase: 166 U/L — ABNORMAL HIGH (ref 11–59)

## 2013-01-10 LAB — CBC
HCT: 39.3 % (ref 39.0–52.0)
Hemoglobin: 13.1 g/dL (ref 13.0–17.0)
MCH: 31 pg (ref 26.0–34.0)
MCHC: 33.3 g/dL (ref 30.0–36.0)
MCV: 92.9 fL (ref 78.0–100.0)
Platelets: 207 10*3/uL (ref 150–400)
RBC: 4.23 MIL/uL (ref 4.22–5.81)
RDW: 14.4 % (ref 11.5–15.5)
WBC: 12.8 10*3/uL — ABNORMAL HIGH (ref 4.0–10.5)

## 2013-01-10 LAB — GLUCOSE, CAPILLARY

## 2013-01-10 MED ORDER — PANTOPRAZOLE SODIUM 40 MG PO TBEC
40.0000 mg | DELAYED_RELEASE_TABLET | Freq: Every day | ORAL | Status: DC
  Administered 2013-01-10 – 2013-01-11 (×2): 40 mg via ORAL
  Filled 2013-01-10 (×2): qty 1

## 2013-01-10 NOTE — Progress Notes (Signed)
Initial visit with patient for discussion of advance directives.  Patient shared that he couldn't read or write well and asked that I return when his wife was present.  She was to come in later this morning. He also shared about past issues of abandonment.  Will follow up.

## 2013-01-10 NOTE — Clinical Social Work Psychosocial (Signed)
Clinical Social Work Department BRIEF PSYCHOSOCIAL ASSESSMENT 01/10/2013  Patient:  Marcus Brooks, Marcus Brooks     Account Number:  [id number]     Admit date:  01/08/2013  Clinical Social Worker:  Nancie Neas  Date/Time:  01/10/2013 09:10 AM  Referred by:  RN  Date Referred:  01/10/2013 Referred for  Homelessness   Other Referral:   Interview type:  Patient Other interview type:    PSYCHOSOCIAL DATA Living Status:  ALONE Admitted from facility:   Level of care:   Primary support name:   Primary support relationship to patient:   Degree of support available:   limited    CURRENT CONCERNS Current Concerns  Other - See comment   Other Concerns:   housing    SOCIAL WORK ASSESSMENT / PLAN CSW met with pt at bedside following referral from RN. Pt alert and oriented and reports he lives at a boarding house. He has been there for the past month. Pt is married, but his wife felt that due to other issues at home, it would be better if he moved out. Pt has disability and reports he has chronic pancreatitis. He had pancreatic cancer 7 years ago. Pt has a feeding tube. He reports that he does not read or write well. Pt's wife comes to the boarding house to assist him with medications. The boarding house does not have heat, but pt has a space heater in his room. Pt denies drug use, and said he occasionally drinks a beer. He has transportation and a PCP. Pt would rather not return to boarding house, but states he has no other options at this point. CSW provided contact information for [employer]. Also discussed homeless shelter if pt refused to return, but he said he would rather go back to boarding house then go to shelter.   Assessment/plan status:  Referral to [facility] Other assessment/ plan:   Information/referral to community resources:   [employer]    PATIENT'S/FAMILY'S RESPONSE TO PLAN OF CARE: Pt plans to work on new housing. Appreciative of  information. CSW will sign off, but can be reconsulted if needed.       Derenda Fennel, [place] [phone]

## 2013-01-10 NOTE — Care Management Note (Signed)
    Page 1 of 1   01/10/2013     3:46:37 PM   CARE MANAGEMENT NOTE 01/10/2013  Patient:  Marcus Brooks, Marcus Brooks   Account Number:  [id number]  Date Initiated:  01/10/2013  Documentation initiated by:  Rosemary Holms  Subjective/Objective Assessment:   Pt states he lives in a boarding home. RN from [facility] spoke to pt and is aware of his poor living conditions. She will be in touch with their CSW for guidance. CM also shared Trish Gilley's contact information Chief Technology Officer) for addition     Action/Plan:   resource.   Anticipated DC Date:     Anticipated DC Plan:  HOME/SELF CARE      DC Planning Services  CM consult      Choice offered to / List presented to:             Status of service:  Completed, signed off Medicare Important Message given?   (If response is "NO", the following Medicare IM given date fields will be blank) Date Medicare IM given:   Date Additional Medicare IM given:    Discharge Disposition:    Per UR Regulation:    If discussed at Long Length of Stay Meetings, dates discussed:    Comments:  01/10/13 Rosemary Holms RN BSN CM

## 2013-01-10 NOTE — Progress Notes (Signed)
[facility] PROGRESS NOTE  Marcus Brooks ZOX:[id number] DOB: 07-26-1960 DOA: 01/08/2013 PCP: Ernestine Conrad, MD Dr. Karilyn Cota is his GI specialist  Assessment/Plan: 1. Hematemesis, resolved. No recurrence. Hemoglobin stable. History of EGD 08/2012 revealing gastritis and esophagitis, no varices. No further evaluation per GI. 2. Syncope. Possibly vasovagal from severe abdominal pain. No history to suggest seizure. Cardiac enzymes unremarkable. 2-D echocardiogram unremarkable. 3. 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 Appears to be clinically resolving despite elevated lipase. CT demonstrated improvement over September 2014. Likely secondary to relapse on alcohol use.  4. Chronic pain syndrome, stable 5. Smoker: Recommend cessation 6. Alcohol abuse, recommend strict abstinence. No evidence of withdrawal. 7. Seizure disorder. Dilantin loaded per pharmacy recommendations. Continue Dilantin. 8. Jejunostomy feeding tube dependent. Tolerating tube feeds. 9. 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 Margo Common is a chronic finding and no further treatment is warranted. 10. Severe malnutrition in context of acute illness   Anticipate advancing diet tomorrow as he feels back to baseline.  Likely home tomorrow.  pegj will need to be changed by interventional radiology as an outpatient   Pending studies:   none  Code Status: full code DVT prophylaxis: SCDs Family Communication: none Disposition Plan: Home when improved  Brendia Sacks, MD  [facility]  Pager [phone] If 7PM-7AM, please contact night-coverage at [email or web address], password [facility] 01/10/2013, 2:19 PM  LOS: 2 days   Summary: 53 year old male with history of alcoholic pancreatitis, chronic, status post G-tube placement for nutrition, pancreatic cancer?, Chronic abdominal pain, chronic narcotic dependence presented with abdominal pain, hematemesis and syncope. He  was admitted for hematemesis, GI consultation, acute on chronic pancreatitis, syncope.  Consultants:  Gastroenterology  Procedures:  2-D echocardiogram: Left ventricular ejection fraction 60-65%. No regional wall motion abnormalities.  Antibiotics:    HPI/Subjective: Feels better today. Abdominal pain more chronic now. No nausea or vomiting. Wants to eat.  Objective: Filed Vitals:   01/09/13 1018 01/09/13 1402 01/10/13 0623 01/10/13 0828  BP: 144/72 125/67 162/83   Pulse: 69 59 66   Temp:  97.6 F (36.4 C) 98.3 F (36.8 C)   TempSrc:  Oral Oral   Resp: 20 20 19    Height:      Weight:      SpO2: 97% 93% 91% 92%    Intake/Output Summary (Last 24 hours) at 01/10/13 1419 Last data filed at 01/10/13 1035  Gross per 24 hour  Intake   1384 ml  Output   2750 ml  Net  -1366 ml     Filed Weights   01/09/13 0437 01/09/13 0509  Weight: 55.566 kg (122 lb 8 oz) 55.792 kg (123 lb)    Exam:   Afebrile, vital signs stable, tachycardia. No hypoxia.  General: Appears calm and comfortable. Speech fluent and clear. Nontoxic.  Cardiovascular: Regular rate and rhythm. No murmur, rub or gallop.   Respiratory: Clear to auscultation bilaterally. No wheezes, rales or rhonchi. Normal respiratory effort.  Abdomen soft  Data Reviewed:  Complete metabolic panel unremarkable. Calcium normal.  Lipase 108 >> 166  Hemoglobin stable. Mild leukocytosis 12.8.  Scheduled Meds: . amitriptyline  25 mg Oral QHS  . amLODipine  10 mg Oral Daily  . cholecalciferol  1,000 Units Oral Daily  . feeding supplement (JEVITY 1.2 CAL)  1,000 mL Per Tube Q24H  . gabapentin  1,200 mg Oral BID  . loratadine  10 mg Oral Daily  . pantoprazole (PROTONIX) IV  40 mg Intravenous Daily  . phenytoin  300 mg Oral Daily  . potassium chloride  10 mEq Oral Daily  . sodium chloride  10 mL Intravenous Q12H  . tiotropium  18 mcg Inhalation Daily  . traZODone  200 mg Oral QHS  . vitamin B-12  1,000 mcg Oral  Daily   Continuous Infusions: . sodium chloride 125 mL/hr at 01/10/13 1610    Principal Problem:   Pancreatitis, acute Active Problems:   Hypertension   GERD (gastroesophageal reflux disease)   Hematemesis   Seizure disorder   Syncope   Protein-calorie malnutrition, severe   Time spent 25 minutes

## 2013-01-10 NOTE — Progress Notes (Signed)
INITIAL NUTRITION ASSESSMENT  DOCUMENTATION CODES Per approved criteria  -Severe malnutrition in the context of acute illness or injury   INTERVENTION:  Patient has jejunostomy tube in place with tip of tube . Jevity 1.2 is infusing @ 35 ml/hr.  Tube feeding regimen currently providing  1008 kcal, 46.6  grams protein, and  678 ml H2O.   Recommend if pt unable to take oral nutrition increase Jevity 1.2 to goal rate of 70 ml/hr which will provide 2016 kcal, 93 gr protein, 1356 ml water q 24 hrs.   Flushes per Adult Enteral protocol   IVF's-NS @ 125 ml/hr  NUTRITION DIAGNOSIS: Inadequate oral intake related to altered GI function  as evidenced  By (acute pancreatitis) NPO with j-tube for primary nutrition, and significant wt loss of 10#, 8% x 90 days.  Goal: Pt to meet >/= 90% of their estimated nutrition needs   Monitor:  Diet advancement/tolerance, enteral nutrition provision, labs and wt trends  Reason for Assessment: Home TF  52 y.o. male  Admitting Dx: Pancreatitis, acute  ASSESSMENT: Pt 52 yo male has hx of chronic pancreatitis and is is s/p J-tube re-placed 07/2010? Significant wt loss noted past 90 days (10#, 8%). His usual enteral feeding regimen is 3 cans Jevity 1.2 per night (2100-0700) which provides 876 kcal, 40 gr protein and 580 ml water. He drinks Sundrop during the day ( 2 liters/day) and take pancreatic enzymes per pt. According to pt his last full meal was during Thanksgiving.  Currently NPO with continuous Jevity 1.2 via J-tube @35  ml/hr and residuals: 0 ml.  Last bm: 01/10/13      Based on nutrition focused exam pt has moderate muscle wasting (clavicle, patellar) and also (thoracic & orbital) subcutaneous fat depletion                     Height: Ht Readings from Last 1 Encounters:  01/09/13 5\' 8"  (1.727 m)    Weight: Wt Readings from Last 1 Encounters:  01/09/13 123 lb (55.792 kg)    Ideal Body Weight: 154# (70 kg)  % Ideal Body Weight: 80%  Wt  Readings from Last 10 Encounters:  01/09/13 123 lb (55.792 kg)  10/04/12 133 lb (60.328 kg)  03/09/11 148 lb 4.8 oz (67.268 kg)  01/29/11 145 lb (65.772 kg)  01/29/11 145 lb (65.772 kg)  11/05/10 145 lb 4.8 oz (65.908 kg)  08/13/10 139 lb 14.4 oz (63.458 kg)  08/04/10 140 lb 9.6 oz (63.776 kg)  07/13/10 164 lb 14.5 oz (74.8 kg)    Usual Body Weight: 133#  % Usual Body Weight: 92%  BMI:  Body mass index is 18.71 kg/(m^2).normal range  Estimated Nutritional Needs: Kcal: 0981-1914 Protein: 85 gr Fluid: 2000 ml daily  Skin: intact  Diet Order: NPO  EDUCATION NEEDS: -Education needs addressed   Intake/Output Summary (Last 24 hours) at 01/10/13 1030 Last data filed at 01/10/13 0626  Gross per 24 hour  Intake   1384 ml  Output   2100 ml  Net   -716 ml    Last BM: 01/10/13  Labs:   Recent Labs Lab 01/09/13 0521 01/09/13 1705 01/10/13 0922  NA 139 139 137  K 3.8 3.8 3.8  CL 104 105 104  CO2 24 23 24   BUN 6 6 4*  CREATININE 0.51 0.49* 0.50  CALCIUM 8.6 8.5 8.4  GLUCOSE 88 77 113*    CBG (last 3)   Recent Labs  01/09/13 0737 01/10/13 0800  GLUCAP  83 124*    Scheduled Meds: . amitriptyline  25 mg Oral QHS  . amLODipine  10 mg Oral Daily  . cholecalciferol  1,000 Units Oral Daily  . feeding supplement (JEVITY 1.2 CAL)  1,000 mL Per Tube Q24H  . gabapentin  1,200 mg Oral BID  . influenza vac split quadrivalent PF  0.5 mL Intramuscular Tomorrow-1000  . loratadine  10 mg Oral Daily  . pantoprazole (PROTONIX) IV  40 mg Intravenous Daily  . phenytoin  300 mg Oral Daily  . potassium chloride  10 mEq Oral Daily  . sodium chloride  10 mL Intravenous Q12H  . tiotropium  18 mcg Inhalation Daily  . traZODone  200 mg Oral QHS  . vitamin B-12  1,000 mcg Oral Daily    Continuous Infusions: . sodium chloride 125 mL/hr at 01/10/13 4098    Past Medical History  Diagnosis Date  . Pancreatitis chronic 08/04/2010  . GERD (gastroesophageal reflux disease)    . Insomnia 08/04/2010  . Seizure 08/04/2010  . Chronic pain syndrome 08/04/2010  . Unspecified essential hypertension 08/04/2010  . Chronic pancreatitis 08/04/2010  . Anxiety states 08/04/2010  . Chronic airway obstruction, not elsewhere classified 08/04/2010  . Tobacco use disorder 08/04/2010  . Other abnormal glucose 08/04/2010    Past Surgical History  Procedure Laterality Date  . Jejunostomy feeding tube  07/13/2010  . Pancreatic pseudocyst drainage  07/13/2010  . Eus  03/26/2009    [facility]  . Portacath placement  11/28/2008    [facility]  . Gastrostomy-jejeunostomy tube change/placement  12/11/2008    [facility]  . I&d extremity Left 09/12/2012    Procedure: IRRIGATION AND DEBRIDEMENT LEFT FIFTH FINGER;  Surgeon: Dominica Severin, MD;  Location: [facility] OR;  Service: Orthopedics;  Laterality: Left;  . Open reduction internal fixation (orif) metacarpal Left 09/12/2012    Procedure: OPEN REDUCTION INTERNAL FIXATION LEFT FIFTH FINGER;  Surgeon: Dominica Severin, MD;  Location: [facility] OR;  Service: Orthopedics;  Laterality: Left;  . Nerve and tendon repair Left 09/12/2012    Procedure: NERVE AND TENDON REPAIR LEFT FIFTH FINGER;  Surgeon: Dominica Severin, MD;  Location: [facility] OR;  Service: Orthopedics;  Laterality: Left;    Royann Shivers MS,RD,CSG,LDN Office: [phone] Pager: [phone]

## 2013-01-10 NOTE — Progress Notes (Signed)
Upon assessment this morning, patient feeding tube had a brown in color material in distal portion of tube.  Marcus Brooks with GI was made aware.  Feedings were still infusing.  This afternoon, began having problems with tube being clogged.  Tube was flushed with sterile water and is now working well.  Feeding bag, water, and tubing changed.  Patient states that tube sometime becomes clogged at home, but is resolved with flushing water.

## 2013-01-10 NOTE — Progress Notes (Addendum)
REVIEWED. Pt has a PEGJ. NEEDS TO BE CHANGED BY INTERVENTIONAL RADIOLOGY OR THE GI DOC THAT PLACED IT ORIGINALLY.

## 2013-01-10 NOTE — Progress Notes (Signed)
Subjective: Improved since admission. Still with abdominal pain, located lower abdomen, running up spine. Some nausea, no vomiting. States GJ tube with pain at insertion site. Worse in the past 4 days. Tube itself "dark" for a few months. No melena or rectal bleeding.   Objective: Vital signs in last 24 hours: Temp:  [97.6 F (36.4 C)-98.3 F (36.8 C)] 98.3 F (36.8 C) (12/30 0623) Pulse Rate:  [59-69] 66 (12/30 0623) Resp:  [19-20] 19 (12/30 0623) BP: (125-162)/(67-83) 162/83 mmHg (12/30 0623) SpO2:  [91 %-97 %] 92 % (12/30 0828) Last BM Date: 01/10/13 General:   Alert and oriented, flat affect Head:  Normocephalic and atraumatic. Heart:  S1, S2 present, no murmurs noted.  Lungs: Clear to auscultation bilaterally Abdomen:  Bowel sounds present, soft, significantly TTP epigastric region, jumping prior to palpation. Pain appears to be out of proportion to current status. GJ tube with small amount of erythema around insertion site but no purulent drainage or edema. Actual tube appears "discolored" brown but no bright red blood.   Extremities:  Without clubbing or edema. Neurologic:  Alert and  oriented x4;  grossly normal neurologically.  Intake/Output from previous day: 12/29 0701 - 12/30 0700 In: 1384 [I.V.:1300; NG/GT:84] Out: 2500 [Urine:2500] Intake/Output this shift:    Lab Results:  Recent Labs  01/09/13 0011 01/09/13 0521 01/09/13 1305 01/09/13 2234  WBC 11.5* 9.0  --  9.5  HGB 13.7 13.1 12.9* 12.7*  HCT 39.9 39.0 38.5* 38.1*  PLT 209 212  --  196   BMET  Recent Labs  01/09/13 0011 01/09/13 0521 01/09/13 1705  NA 133* 139 139  K 3.7 3.8 3.8  CL 97 104 105  CO2 26 24 23   GLUCOSE 132* 88 77  BUN 7 6 6   CREATININE 0.53 0.51 0.49*  CALCIUM 9.4 8.6 8.5   LFT  Recent Labs  01/09/13 0011 01/09/13 1705  PROT 7.8 6.9  ALBUMIN 3.0* 2.6*  AST 15 19  ALT 14 14  ALKPHOS 91 86  BILITOT 0.3 0.4   PT/INR  Recent Labs  01/09/13 0521  LABPROT 13.9   INR 1.09    Studies/Results: Ct Abdomen Pelvis W Contrast  01/09/2013   CLINICAL DATA:  Abdominal pain, nausea, vomiting, syncope. History of pancreatitis.  EXAM: CT ABDOMEN AND PELVIS WITH CONTRAST  TECHNIQUE: Multidetector CT imaging of the abdomen and pelvis was performed using the standard protocol following bolus administration of intravenous contrast.  CONTRAST:  50mL OMNIPAQUE IOHEXOL 300 MG/ML SOLN, OMNIPAQUE IOHEXOL 300 MG/ML SOLN  COMPARISON:  10/03/2012  FINDINGS: Mild infiltration or dependent atelectasis in the lung bases. Tiny pulmonary nodules measuring less than 2 mm diameter. Mild distal esophageal wall thickening may represent reflux or inflammatory process.  Calcified granulomas in the liver and spleen. Stable appearance of tiny cysts in the liver. Periportal edema. No bile duct dilatation. Gallbladder is normal. Pancreas demonstrates multiple calcifications consistent with chronic pancreatitis. Since the previous study, the perihepatic fluid collection has resolved and peripancreatic fluid collections adjacent to the body and tail have decreased in size. Upper abdominal fluid and infiltrative changes are decreasing. Changes are consistent with improving pancreatitis. Multiple lymph nodes are present in the upper abdomen without pathologic enlargement. Splenic vein is tortuous and splenic varices appear to be present. 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 Gastrojejunostomy tube is in place. Stomach, small bowel, and colon are not abnormally distended. No free air in the abdomen. The kidneys, adrenal glands, abdominal aorta, inferior vena cava, and  retroperitoneal lymph nodes are unremarkable.  Pelvis: Prostate gland is enlarged. Bladder wall is not thickened. No free or loculated pelvic fluid collections. Appendix is normal. No diverticulitis.  IMPRESSION: Improving inflammatory changes in the peripancreatic and upper abdominal regions consistent with improving acute pancreatitis. Small  residual pseudo cysts are smaller. Calcification in the pancreas consistent with chronic pancreatitis. 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 No evidence of progression since previous study.   Electronically Signed   By: Burman Nieves M.D.   On: 01/09/2013 02:48    Assessment: 52 year old male with acute on chronic ETOH calcific pancreatitis, with CT showing size of pseudocyst decreased and less inflammatory changes in comparison to Sept 2014 imaging. Although he denies drinking ETOH to me, he did report intermittent ETOH use on admission. Mild improvement since admission, with current labs pending for today.   Notes considerable tenderness at insertion site of GJ tube, worsened in past several days. Tube itself is discolored and appears brown but no evidence of overt bleeding. EGD by Dr. Teena Dunk Aug 2014 with gastritis and esophagitis. Jevity at 42ml/hour per tube without any difficulties. Unclear when last exchanged. May need to be exchanged this admission.  Plan: Follow-up on pending labs (lipase, CBC) Remain NPO except for tube feedings Absolute avoidance of alcohol Will try and research last tube exchange Further recommendations to follow  Nira Retort, ANP-BC [facility]    LOS: 2 days    01/10/2013, 9:05 AM

## 2013-01-11 ENCOUNTER — Telehealth: Payer: Self-pay | Admitting: Gastroenterology

## 2013-01-11 NOTE — Discharge Summary (Signed)
Physician Discharge Summary  Marcus Brooks WUJ:[id number] DOB: July 30, 1960 DOA: 01/08/2013  PCP: Marcus Brooks  Admit date: 01/08/2013 Discharge date: 01/11/2013  Recommendations for Outpatient Follow-up:  1. Followup resolution of acute on chronic alcoholic pancreatitis 2. Followup chronic pain 3. Followup alcohol abuse 4. Followup seizure disorder. No seizures to her admission. Consider repeat Dilantin monitoring in one week. 5. Patient should have PEGJ exchanged as an outpatient by the physician that placed. This can be arranged by PCP.  Follow-up Information   Follow up with Marcus Brooks In 1 week.   Specialty:  Family Medicine   Contact information:   Marcus Brooks [address] [place] [address] [phone]      Discharge Diagnoses:  1. Hematemesis 2. Syncope 3. 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 Chronic pain syndrome 5. Smoker 6. Alcohol abuse 7. 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 Severe malnutrition  Discharge Condition: Improved Disposition: Home  Diet recommendation: continue TF. Clear liquids, advance to bland  [facility] Weights   01/09/13 0437 01/09/13 0509  Weight: 55.566 kg (122 lb 8 oz) 55.792 kg (123 lb)    History of present illness:  52 year old male with history of alcoholic pancreatitis, chronic, status post G-tube placement for nutrition, pancreatic cancer?, Chronic abdominal pain, chronic narcotic dependence presented with abdominal pain, hematemesis and syncope. He was admitted for hematemesis, GI consultation, acute on chronic pancreatitis, syncope.  Hospital Course:  Marcus Brooks was admitted for pancreatitis and rapidly improved with supportive care. Use in consultation with GI were hematemesis but had no recurrence. No bleeding. Hemoglobin was stable. No further evaluation for hematemesis was suggested by GI. Clinically he rapidly improved although based remained modestly elevated. Tolerating jejunal feeds.  Stable for discharge home. See individual issues as below.  1. Hematemesis, resolved. No recurrence. Hemoglobin stable. History of EGD 08/2012 revealing gastritis and esophagitis, no varices. No further evaluation per GI. 2. Syncope. Possibly vasovagal from severe abdominal pain. No history to suggest seizure. Cardiac enzymes unremarkable. 2-D echocardiogram unremarkable. 3. 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 Appears to be clinically resolving despite elevated lipase. CT demonstrated improvement over September 2014. Likely secondary to relapse on alcohol use.  4. Chronic pain syndrome, stable 5. Smoker: Recommend cessation 6. Alcohol abuse, recommend strict abstinence. No evidence of withdrawal. 7. Seizure disorder. Dilantin loaded per pharmacy recommendations. Continue Dilantin. 8. Jejunostomy feeding tube dependent. Tolerating tube feeds. 9. 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 10. Severe malnutrition in context of acute illness  Consultants:  Gastroenterology Procedures:  2-D echocardiogram: Left ventricular ejection fraction 60-65%. No regional wall motion abnormalities. Antibiotics:   None  Discharge Instructions  Discharge Orders   Future Orders Complete By Expires   Activity as tolerated - No restrictions  As directed    Diet general  As directed    Discharge instructions  As directed    Comments:     Call physician or seek immediate medical attention for recurrent pain, bleeding, nausea, vomiting or worsening of condition. Absolutely no alcohol! Alcohol worsens pancreatitis.       Medication List         albuterol 108 (90 BASE) MCG/ACT inhaler  Commonly known as:  PROVENTIL HFA;VENTOLIN HFA  Inhale 2 puffs into the lungs every 6 (six) hours as needed for wheezing or shortness of breath.     amitriptyline 25 MG tablet  Commonly  known as:  ELAVIL  Take 25 mg by mouth at bedtime.     amLODipine 10 MG tablet  Commonly known as:  NORVASC  Take 10 mg by mouth daily.     cholecalciferol 1000 UNITS tablet  Commonly known as:  VITAMIN D  Take 1,000 Units by mouth daily.     diazepam 10 MG tablet  Commonly known as:  VALIUM  Take 10 mg by mouth 3 (three) times daily as needed for anxiety.     gabapentin 300 MG capsule  Commonly known as:  NEURONTIN  Take 1,200 mg by mouth 2 (two) times daily.     HYDROcodone-acetaminophen 10-325 MG per tablet  Commonly known as:  NORCO  Take 1 tablet by mouth every 6 (six) hours as needed for pain (hold if lethargic).     loperamide 2 MG capsule  Commonly known as:  IMODIUM  Take 8 mg by mouth daily.     loratadine 10 MG tablet  Commonly known as:  CLARITIN  Take 10 mg by mouth daily.     omeprazole 40 MG capsule  Commonly known as:  PRILOSEC  Take 40 mg by mouth 2 (two) times daily.     phenytoin 100 MG ER capsule  Commonly known as:  DILANTIN  Take 300 mg by mouth every evening.     potassium chloride 10 MEQ CR tablet  Commonly known as:  KLOR-CON  Take 10 mEq by mouth daily.     tiotropium 18 MCG inhalation capsule  Commonly known as:  SPIRIVA  Place 18 mcg into inhaler and inhale daily.     traZODone 100 MG tablet  Commonly known as:  DESYREL  Take 200 mg by mouth at bedtime.     vitamin B-12 1000 MCG tablet  Commonly known as:  CYANOCOBALAMIN  Take 1,000 mcg by mouth daily.       Allergies  Allergen Reactions  . Cheese Hives    The results of significant diagnostics from this hospitalization (including imaging, microbiology, ancillary and laboratory) are listed below for reference.    Significant Diagnostic Studies: Ct Abdomen Pelvis W Contrast  01/09/2013   CLINICAL DATA:  Abdominal pain, nausea, vomiting, syncope. History of pancreatitis.  EXAM: CT ABDOMEN AND PELVIS WITH CONTRAST  TECHNIQUE: Multidetector CT imaging of the abdomen and pelvis  was performed using the standard protocol following bolus administration of intravenous contrast.  CONTRAST:  50mL OMNIPAQUE IOHEXOL 300 MG/ML SOLN, OMNIPAQUE IOHEXOL 300 MG/ML SOLN  COMPARISON:  10/03/2012  FINDINGS: Mild infiltration or dependent atelectasis in the lung bases. Tiny pulmonary nodules measuring less than 2 mm diameter. Mild distal esophageal wall thickening may represent reflux or inflammatory process.  Calcified granulomas in the liver and spleen. Stable appearance of tiny cysts in the liver. Periportal edema. No bile duct dilatation. Gallbladder is normal. Pancreas demonstrates multiple calcifications consistent with chronic pancreatitis. Since the previous study, the perihepatic fluid collection has resolved and peripancreatic fluid collections adjacent to the body and tail have decreased in size. Upper abdominal fluid and infiltrative changes are decreasing. Changes are consistent with improving pancreatitis. Multiple lymph nodes are present in the upper abdomen without pathologic enlargement. Splenic vein is tortuous and splenic varices appear to be present. 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 Gastrojejunostomy tube is in place. Stomach, small bowel, and colon are not abnormally distended. No free air in the abdomen. The kidneys, adrenal glands, abdominal aorta, inferior vena cava, and retroperitoneal lymph nodes are unremarkable.  Pelvis: Prostate gland is enlarged. Bladder  wall is not thickened. No free or loculated pelvic fluid collections. Appendix is normal. No diverticulitis.  IMPRESSION: Improving inflammatory changes in the peripancreatic and upper abdominal regions consistent with improving acute pancreatitis. Small residual pseudo cysts are smaller. Calcification in the pancreas consistent with chronic pancreatitis. 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 No evidence of progression since previous study.   Electronically Signed   By: Burman Nieves M.D.   On:  01/09/2013 02:48   Labs: Basic Metabolic Panel:  Recent Labs Lab 01/09/13 0011 01/09/13 0521 01/09/13 1705 01/10/13 0922  NA 133* 139 139 137  K 3.7 3.8 3.8 3.8  CL 97 104 105 104  CO2 26 24 23 24   GLUCOSE 132* 88 77 113*  BUN 7 6 6  4*  CREATININE 0.53 0.51 0.49* 0.50  CALCIUM 9.4 8.6 8.5 8.4   Liver Function Tests:  Recent Labs Lab 01/09/13 0011 01/09/13 1705 01/10/13 0922  AST 15 19 16   ALT 14 14 13   ALKPHOS 91 86 95  BILITOT 0.3 0.4 0.3  PROT 7.8 6.9 6.9  ALBUMIN 3.0* 2.6* 2.7*    Recent Labs Lab 01/09/13 0011 01/09/13 1705 01/10/13 0922  LIPASE 100* 108* 166*   CBC:  Recent Labs Lab 01/09/13 0011 01/09/13 0521 01/09/13 1305 01/09/13 2234 01/10/13 0922  WBC 11.5* 9.0  --  9.5 12.8*  NEUTROABS 7.4  --   --   --   --   HGB 13.7 13.1 12.9* 12.7* 13.1  HCT 39.9 39.0 38.5* 38.1* 39.3  MCV 92.6 93.5  --  93.6 92.9  PLT 209 212  --  196 207   Cardiac Enzymes:  Recent Labs Lab 01/09/13 0521 01/09/13 1115 01/09/13 1705  TROPONINI <0.30 <0.30 <0.30   : CBG:  Recent Labs Lab 01/09/13 0737 01/10/13 0800 01/11/13 0741  GLUCAP 83 124* 99    Principal Problem:   Pancreatitis, acute Active Problems:   Hypertension   GERD (gastroesophageal reflux disease)   Hematemesis   Seizure disorder   Syncope   Protein-calorie malnutrition, severe   Time coordinating discharge: 25 minutes  Signed:  Brendia Sacks, Brooks [facility] 01/11/2013, 10:29 AM

## 2013-01-11 NOTE — Discharge Planning (Addendum)
Pt stated he was ready to go home and would not need any additional help at home.  Pt's pain is about a 6 but he will be given Norco just before he leaves.  Port De-accessed and covered with guaze and tegaderm. Pt was given DC papers and told of follow ups.  Pt also told of s/sx that may cause him to call the doctor or return to the hospital. Pt will be wheeled to car when ride arrives and he is ready. PCT was instructed to call security once pt is downstairs and ready to go home, so they can bring him his pocket knife.

## 2013-01-11 NOTE — Telephone Encounter (Signed)
PT NEEDS PEGJ CHANGED BY IR WITHIN THE NEXT 7-14 DAYS.

## 2013-01-11 NOTE — Telephone Encounter (Signed)
Patient seen inpatient due to acute on chronic alcoholic calcific pancreatitis. He needs his PEGJ tube changed out via IR or by whomever had placed recently. Established patient with your practice; wanted to make sure he followed up with you for hospital follow-up and tube change. Thanks!

## 2013-01-11 NOTE — Progress Notes (Signed)
[facility] PROGRESS NOTE  Marcus Brooks VWU:[id number] DOB: 1960-06-10 DOA: 01/08/2013 PCP: Ernestine Conrad, MD Dr. Karilyn Cota is his GI specialist  Assessment/Plan: 1. Hematemesis, resolved. No recurrence. Hemoglobin stable. History of EGD 08/2012 revealing gastritis and esophagitis, no varices. No further evaluation per GI. 2. Syncope. Possibly vasovagal from severe abdominal pain. No history to suggest seizure. Cardiac enzymes unremarkable. 2-D echocardiogram unremarkable. 3. 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 Appears to be clinically resolving despite elevated lipase. CT demonstrated improvement over September 2014. Likely secondary to relapse on alcohol use.  4. Chronic pain syndrome, stable 5. Smoker: Recommend cessation 6. Alcohol abuse, recommend strict abstinence. No evidence of withdrawal. 7. Seizure disorder. Dilantin loaded per pharmacy recommendations. Continue Dilantin. 8. Jejunostomy feeding tube dependent. Tolerating tube feeds. 9. 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 Margo Common is a chronic finding and no further treatment is warranted. 10. Severe malnutrition in context of acute illness   Home today  pegj will need to be changed by interventional radiology as an outpatient   Pending studies:   none  Code Status: full code DVT prophylaxis: SCDs Family Communication: none Disposition Plan: Home when improved  Brendia Sacks, MD  [facility]  Pager [phone] If 7PM-7AM, please contact night-coverage at [email or web address], password [facility] 01/11/2013, 10:17 AM  LOS: 3 days   Summary: 52 year old male with history of alcoholic pancreatitis, chronic, status post G-tube placement for nutrition, pancreatic cancer?, Chronic abdominal pain, chronic narcotic dependence presented with abdominal pain, hematemesis and syncope. He was admitted for hematemesis, GI consultation, acute on chronic pancreatitis,  syncope.  Consultants:  Gastroenterology  Procedures:  2-D echocardiogram: Left ventricular ejection fraction 60-65%. No regional wall motion abnormalities.  Antibiotics:    HPI/Subjective: Feels okay. No nausea, vomiting. Very thirsty. Chronic abdominal pain at baseline.  Objective: Filed Vitals:   01/10/13 1500 01/10/13 2147 01/11/13 0609 01/11/13 1000  BP: 135/76 145/79 124/58 126/60  Pulse: 72 61 63   Temp: 98.3 F (36.8 C) 98.1 F (36.7 C) 97.5 F (36.4 C)   TempSrc:  Oral Oral   Resp: 18 18 18    Height:      Weight:      SpO2: 97% 97% 99%     Intake/Output Summary (Last 24 hours) at 01/11/13 1017 Last data filed at 01/11/13 0500  Gross per 24 hour  Intake   1225 ml  Output   2500 ml  Net  -1275 ml     Filed Weights   01/09/13 0437 01/09/13 0509  Weight: 55.566 kg (122 lb 8 oz) 55.792 kg (123 lb)    Exam:   Afebrile, vital signs stable. No hypoxia.  General: Appears calm and comfortable. Speech fluent and clear.  Cardio vascular: Regular rate and rhythm. No murmur, rub or gallop. No lower extremity edema.  Respiratory: Clear to auscultation bilaterally. No wheezes, rales or rhonchi. Normal respiratory effort.  Abdomen soft, nontender, nondistended  Data Reviewed:  Fasting blood sugar 99  Scheduled Meds: . amitriptyline  25 mg Oral QHS  . amLODipine  10 mg Oral Daily  . cholecalciferol  1,000 Units Oral Daily  . feeding supplement (JEVITY 1.2 CAL)  1,000 mL Per Tube Q24H  . gabapentin  1,200 mg Oral BID  . loratadine  10 mg Oral Daily  . pantoprazole  40 mg Oral Daily  . phenytoin  300 mg Oral Daily  . potassium chloride  10 mEq Oral Daily  . sodium chloride  10 mL Intravenous Q12H  .  tiotropium  18 mcg Inhalation Daily  . traZODone  200 mg Oral QHS  . vitamin B-12  1,000 mcg Oral Daily   Continuous Infusions:    Principal Problem:   Pancreatitis, acute Active Problems:   Hypertension   GERD (gastroesophageal reflux disease)    Hematemesis   Seizure disorder   Syncope   Protein-calorie malnutrition, severe

## 2013-01-15 NOTE — Telephone Encounter (Signed)
Will arrange for PEG/ PEJ change. Ann, be scheduled with interventional radiologist at [facility].

## 2013-01-16 ENCOUNTER — Encounter (HOSPITAL_COMMUNITY): Payer: Self-pay | Admitting: Emergency Medicine

## 2013-01-16 DIAGNOSIS — J4489 Other specified chronic obstructive pulmonary disease: Secondary | ICD-10-CM | POA: Diagnosis present

## 2013-01-16 DIAGNOSIS — F1010 Alcohol abuse, uncomplicated: Secondary | ICD-10-CM | POA: Diagnosis present

## 2013-01-16 DIAGNOSIS — G894 Chronic pain syndrome: Secondary | ICD-10-CM | POA: Diagnosis present

## 2013-01-16 DIAGNOSIS — K2991 Gastroduodenitis, unspecified, with bleeding: Principal | ICD-10-CM

## 2013-01-16 DIAGNOSIS — K449 Diaphragmatic hernia without obstruction or gangrene: Secondary | ICD-10-CM | POA: Diagnosis present

## 2013-01-16 DIAGNOSIS — K219 Gastro-esophageal reflux disease without esophagitis: Secondary | ICD-10-CM | POA: Diagnosis present

## 2013-01-16 DIAGNOSIS — G40909 Epilepsy, unspecified, not intractable, without status epilepticus: Secondary | ICD-10-CM | POA: Diagnosis present

## 2013-01-16 DIAGNOSIS — Z931 Gastrostomy status: Secondary | ICD-10-CM

## 2013-01-16 DIAGNOSIS — F1920 Other psychoactive substance dependence, uncomplicated: Secondary | ICD-10-CM | POA: Diagnosis present

## 2013-01-16 DIAGNOSIS — F17200 Nicotine dependence, unspecified, uncomplicated: Secondary | ICD-10-CM | POA: Diagnosis present

## 2013-01-16 DIAGNOSIS — K861 Other chronic pancreatitis: Secondary | ICD-10-CM | POA: Diagnosis present

## 2013-01-16 DIAGNOSIS — D62 Acute posthemorrhagic anemia: Secondary | ICD-10-CM | POA: Diagnosis present

## 2013-01-16 DIAGNOSIS — I10 Essential (primary) hypertension: Secondary | ICD-10-CM | POA: Diagnosis present

## 2013-01-16 DIAGNOSIS — E43 Unspecified severe protein-calorie malnutrition: Secondary | ICD-10-CM | POA: Diagnosis present

## 2013-01-16 DIAGNOSIS — Z9861 Coronary angioplasty status: Secondary | ICD-10-CM

## 2013-01-16 DIAGNOSIS — I2510 Atherosclerotic heart disease of native coronary artery without angina pectoris: Secondary | ICD-10-CM | POA: Diagnosis present

## 2013-01-16 DIAGNOSIS — J449 Chronic obstructive pulmonary disease, unspecified: Secondary | ICD-10-CM | POA: Diagnosis present

## 2013-01-16 DIAGNOSIS — E871 Hypo-osmolality and hyponatremia: Secondary | ICD-10-CM | POA: Diagnosis not present

## 2013-01-16 DIAGNOSIS — D7389 Other diseases of spleen: Secondary | ICD-10-CM | POA: Diagnosis present

## 2013-01-16 DIAGNOSIS — K2971 Gastritis, unspecified, with bleeding: Principal | ICD-10-CM | POA: Diagnosis present

## 2013-01-16 DIAGNOSIS — Z681 Body mass index (BMI) 19 or less, adult: Secondary | ICD-10-CM

## 2013-01-16 DIAGNOSIS — Z934 Other artificial openings of gastrointestinal tract status: Secondary | ICD-10-CM

## 2013-01-16 DIAGNOSIS — K859 Acute pancreatitis without necrosis or infection, unspecified: Secondary | ICD-10-CM | POA: Diagnosis present

## 2013-01-16 NOTE — ED Notes (Addendum)
abd pain for 2 week, vomiting blood today, Had similar episode 2 weeks ago and seen here  Dx pancreatitis  Has a porta cath and  G tube

## 2013-01-16 NOTE — Telephone Encounter (Signed)
Forwarded to Lelon Frohlich to address

## 2013-01-16 NOTE — Telephone Encounter (Signed)
Left message for interventional scheduled to call me to get this scheduled

## 2013-01-17 ENCOUNTER — Emergency Department (HOSPITAL_COMMUNITY): Payer: Medicaid Other

## 2013-01-17 ENCOUNTER — Encounter (HOSPITAL_COMMUNITY): Payer: Self-pay | Admitting: Emergency Medicine

## 2013-01-17 ENCOUNTER — Inpatient Hospital Stay (HOSPITAL_COMMUNITY)
Admission: EM | Admit: 2013-01-17 | Discharge: 2013-01-21 | DRG: 377 | Disposition: A | Discharge status: Home Health | Payer: Medicaid Other | Attending: Family Medicine | Admitting: Family Medicine

## 2013-01-17 DIAGNOSIS — K219 Gastro-esophageal reflux disease without esophagitis: Secondary | ICD-10-CM | POA: Diagnosis present

## 2013-01-17 DIAGNOSIS — I10 Essential (primary) hypertension: Secondary | ICD-10-CM

## 2013-01-17 DIAGNOSIS — D72829 Elevated white blood cell count, unspecified: Secondary | ICD-10-CM | POA: Diagnosis present

## 2013-01-17 DIAGNOSIS — K861 Other chronic pancreatitis: Secondary | ICD-10-CM | POA: Diagnosis present

## 2013-01-17 DIAGNOSIS — T85848A Pain due to other internal prosthetic devices, implants and grafts, initial encounter: Secondary | ICD-10-CM

## 2013-01-17 DIAGNOSIS — K922 Gastrointestinal hemorrhage, unspecified: Secondary | ICD-10-CM

## 2013-01-17 DIAGNOSIS — E43 Unspecified severe protein-calorie malnutrition: Secondary | ICD-10-CM | POA: Diagnosis present

## 2013-01-17 DIAGNOSIS — K859 Acute pancreatitis without necrosis or infection, unspecified: Secondary | ICD-10-CM

## 2013-01-17 DIAGNOSIS — Z934 Other artificial openings of gastrointestinal tract status: Secondary | ICD-10-CM

## 2013-01-17 DIAGNOSIS — D62 Acute posthemorrhagic anemia: Secondary | ICD-10-CM | POA: Diagnosis present

## 2013-01-17 DIAGNOSIS — G40909 Epilepsy, unspecified, not intractable, without status epilepticus: Secondary | ICD-10-CM

## 2013-01-17 DIAGNOSIS — K920 Hematemesis: Secondary | ICD-10-CM

## 2013-01-17 DIAGNOSIS — E871 Hypo-osmolality and hyponatremia: Secondary | ICD-10-CM | POA: Diagnosis not present

## 2013-01-17 DIAGNOSIS — I82890 Acute embolism and thrombosis of other specified veins: Secondary | ICD-10-CM | POA: Diagnosis present

## 2013-01-17 DIAGNOSIS — K449 Diaphragmatic hernia without obstruction or gangrene: Secondary | ICD-10-CM | POA: Diagnosis present

## 2013-01-17 HISTORY — DX: Atherosclerotic heart disease of native coronary artery without angina pectoris: I25.10

## 2013-01-17 HISTORY — DX: Acute embolism and thrombosis of other specified veins: I82.890

## 2013-01-17 HISTORY — DX: Diaphragmatic hernia without obstruction or gangrene: K44.9

## 2013-01-17 LAB — LIPASE, BLOOD
LIPASE: 103 U/L — AB (ref 11–59)
Lipase: 156 U/L — ABNORMAL HIGH (ref 11–59)

## 2013-01-17 LAB — CBC
HCT: 46.7 % (ref 39.0–52.0)
Hemoglobin: 15.9 g/dL (ref 13.0–17.0)
MCH: 31.5 pg (ref 26.0–34.0)
MCHC: 34 g/dL (ref 30.0–36.0)
MCV: 92.5 fL (ref 78.0–100.0)
Platelets: 297 10*3/uL (ref 150–400)
RBC: 5.05 MIL/uL (ref 4.22–5.81)
RDW: 14.2 % (ref 11.5–15.5)
WBC: 16.6 10*3/uL — ABNORMAL HIGH (ref 4.0–10.5)

## 2013-01-17 LAB — URINALYSIS, ROUTINE W REFLEX MICROSCOPIC
GLUCOSE, UA: NEGATIVE mg/dL
Hgb urine dipstick: NEGATIVE
Leukocytes, UA: NEGATIVE
NITRITE: NEGATIVE
PH: 6 (ref 5.0–8.0)
Protein, ur: 100 mg/dL — AB
Urobilinogen, UA: 0.2 mg/dL (ref 0.0–1.0)

## 2013-01-17 LAB — CBC WITH DIFFERENTIAL/PLATELET
BASOS ABS: 0.1 10*3/uL (ref 0.0–0.1)
BASOS PCT: 1 % (ref 0–1)
EOS ABS: 0.2 10*3/uL (ref 0.0–0.7)
Eosinophils Relative: 1 % (ref 0–5)
HCT: 46.9 % (ref 39.0–52.0)
Hemoglobin: 16.8 g/dL (ref 13.0–17.0)
Lymphocytes Relative: 15 % (ref 12–46)
Lymphs Abs: 2.2 10*3/uL (ref 0.7–4.0)
MCH: 32.8 pg (ref 26.0–34.0)
MCHC: 35.8 g/dL (ref 30.0–36.0)
MCV: 91.6 fL (ref 78.0–100.0)
Monocytes Absolute: 1.1 10*3/uL — ABNORMAL HIGH (ref 0.1–1.0)
Monocytes Relative: 8 % (ref 3–12)
NEUTROS ABS: 10.8 10*3/uL — AB (ref 1.7–7.7)
Neutrophils Relative %: 75 % (ref 43–77)
PLATELETS: 297 10*3/uL (ref 150–400)
RBC: 5.12 MIL/uL (ref 4.22–5.81)
RDW: 14.1 % (ref 11.5–15.5)
WBC: 14.4 10*3/uL — ABNORMAL HIGH (ref 4.0–10.5)

## 2013-01-17 LAB — URINE MICROSCOPIC-ADD ON

## 2013-01-17 LAB — TYPE AND SCREEN
ABO/RH(D): O POS
ANTIBODY SCREEN: NEGATIVE

## 2013-01-17 LAB — HEMOGLOBIN AND HEMATOCRIT, BLOOD
HCT: 45.3 % (ref 39.0–52.0)
HCT: 43.5 % (ref 39.0–52.0)
HCT: 41.2 % (ref 39.0–52.0)
HEMOGLOBIN: 15.6 g/dL (ref 13.0–17.0)
HEMOGLOBIN: 13.7 g/dL (ref 13.0–17.0)
Hemoglobin: 14.9 g/dL (ref 13.0–17.0)

## 2013-01-17 LAB — PROTIME-INR
INR: 1.03 (ref 0.00–1.49)
PROTHROMBIN TIME: 13.3 s (ref 11.6–15.2)

## 2013-01-17 LAB — COMPREHENSIVE METABOLIC PANEL
ALBUMIN: 3.7 g/dL (ref 3.5–5.2)
ALK PHOS: 131 U/L — AB (ref 39–117)
ALT: 21 U/L (ref 0–53)
AST: 21 U/L (ref 0–37)
BUN: 15 mg/dL (ref 6–23)
CO2: 33 mEq/L — ABNORMAL HIGH (ref 19–32)
Calcium: 9.8 mg/dL (ref 8.4–10.5)
Chloride: 94 mEq/L — ABNORMAL LOW (ref 96–112)
Creatinine, Ser: 0.73 mg/dL (ref 0.50–1.35)
GFR calc Af Amer: >90 mL/min (ref >90)
GFR calc non Af Amer: >90 mL/min (ref >90)
Glucose, Bld: 134 mg/dL — ABNORMAL HIGH (ref 70–99)
POTASSIUM: 3.3 meq/L — AB (ref 3.7–5.3)
SODIUM: 141 meq/L (ref 137–147)
TOTAL PROTEIN: 9.1 g/dL — AB (ref 6.0–8.3)
Total Bilirubin: 0.3 mg/dL (ref 0.3–1.2)

## 2013-01-17 LAB — PHENYTOIN LEVEL, TOTAL: Phenytoin Lvl: <2.5 ug/mL — ABNORMAL LOW (ref 10.0–20.0)

## 2013-01-17 LAB — MRSA PCR SCREENING: MRSA BY PCR: NEGATIVE

## 2013-01-17 MED ORDER — FOLIC ACID 1 MG PO TABS
1.0000 mg | ORAL_TABLET | Freq: Every day | ORAL | Status: DC
  Administered 2013-01-17 – 2013-01-21 (×5): 1 mg via ORAL
  Filled 2013-01-17 (×6): qty 1

## 2013-01-17 MED ORDER — VITAMIN B-1 100 MG PO TABS
100.0000 mg | ORAL_TABLET | Freq: Every day | ORAL | Status: DC
  Administered 2013-01-19 – 2013-01-21 (×3): 100 mg via ORAL
  Filled 2013-01-17 (×3): qty 1

## 2013-01-17 MED ORDER — PANTOPRAZOLE SODIUM 40 MG IV SOLR
40.0000 mg | Freq: Once | INTRAVENOUS | Status: AC
  Administered 2013-01-17: 40 mg via INTRAVENOUS
  Filled 2013-01-17: qty 40

## 2013-01-17 MED ORDER — ACETAMINOPHEN 325 MG PO TABS: 650.0000 mg | ORAL_TABLET | Freq: Four times a day (QID) | ORAL | Status: DC | PRN

## 2013-01-17 MED ORDER — POTASSIUM CHLORIDE IN NACL 20-0.9 MEQ/L-% IV SOLN
INTRAVENOUS | Status: DC
  Administered 2013-01-17: 07:00:00 via INTRAVENOUS

## 2013-01-17 MED ORDER — HYDROMORPHONE HCL PF 1 MG/ML IJ SOLN
1.0000 mg | Freq: Once | INTRAMUSCULAR | Status: AC
Start: 2013-01-17 — End: 2013-01-17
  Administered 2013-01-17: 1 mg via INTRAVENOUS
  Filled 2013-01-17: qty 1

## 2013-01-17 MED ORDER — THIAMINE HCL 100 MG/ML IJ SOLN
100.0000 mg | Freq: Every day | INTRAMUSCULAR | Status: DC
  Administered 2013-01-17 – 2013-01-18 (×2): 100 mg via INTRAVENOUS
  Filled 2013-01-17 (×2): qty 2

## 2013-01-17 MED ORDER — TRAZODONE HCL 50 MG PO TABS
100.0000 mg | ORAL_TABLET | Freq: Every day | ORAL | Status: DC
  Administered 2013-01-17 – 2013-01-20 (×4): 100 mg via ORAL
  Filled 2013-01-17 (×4): qty 2

## 2013-01-17 MED ORDER — SODIUM CHLORIDE 0.9 % IV SOLN: 250.0000 mg | Freq: Three times a day (TID) | INTRAVENOUS | Status: DC

## 2013-01-17 MED ORDER — LORAZEPAM 2 MG/ML IJ SOLN
1.0000 mg | Freq: Four times a day (QID) | INTRAMUSCULAR | Status: DC | PRN
  Administered 2013-01-17 – 2013-01-20 (×4): 1 mg via INTRAVENOUS
  Filled 2013-01-17 (×4): qty 1

## 2013-01-17 MED ORDER — LORAZEPAM 1 MG PO TABS
1.0000 mg | ORAL_TABLET | Freq: Four times a day (QID) | ORAL | Status: DC | PRN
  Administered 2013-01-19: 1 mg via ORAL
  Filled 2013-01-17: qty 1

## 2013-01-17 MED ORDER — PHENYTOIN SODIUM EXTENDED 100 MG PO CAPS: 300.0000 mg | ORAL_CAPSULE | Freq: Every evening | ORAL | Status: DC

## 2013-01-17 MED ORDER — POTASSIUM CHLORIDE IN NACL 20-0.9 MEQ/L-% IV SOLN: INTRAVENOUS | Status: DC

## 2013-01-17 MED ORDER — ONDANSETRON HCL 4 MG/2ML IJ SOLN
4.0000 mg | Freq: Once | INTRAMUSCULAR | Status: AC
  Administered 2013-01-17: 4 mg via INTRAVENOUS
  Filled 2013-01-17: qty 2

## 2013-01-17 MED ORDER — JEVITY 1.2 CAL PO LIQD
1000.0000 mL | ORAL | Status: DC
  Administered 2013-01-17 – 2013-01-18 (×2): 1000 mL
  Filled 2013-01-17 (×2): qty 1000

## 2013-01-17 MED ORDER — ALBUTEROL SULFATE (2.5 MG/3ML) 0.083% IN NEBU: 2.5000 mg | INHALATION_SOLUTION | RESPIRATORY_TRACT | Status: DC | PRN

## 2013-01-17 MED ORDER — DIAZEPAM 5 MG PO TABS
5.0000 mg | ORAL_TABLET | Freq: Three times a day (TID) | ORAL | Status: DC | PRN
  Administered 2013-01-17: 5 mg via ORAL
  Filled 2013-01-17: qty 1

## 2013-01-17 MED ORDER — PANTOPRAZOLE SODIUM 40 MG IV SOLR
40.0000 mg | Freq: Two times a day (BID) | INTRAVENOUS | Status: DC
  Administered 2013-01-17 – 2013-01-20 (×7): 40 mg via INTRAVENOUS
  Filled 2013-01-17 (×7): qty 40

## 2013-01-17 MED ORDER — AMITRIPTYLINE HCL 25 MG PO TABS
25.0000 mg | ORAL_TABLET | Freq: Every day | ORAL | Status: DC
  Administered 2013-01-17 – 2013-01-20 (×4): 25 mg via ORAL
  Filled 2013-01-17 (×4): qty 1

## 2013-01-17 MED ORDER — MORPHINE SULFATE 4 MG/ML IJ SOLN
4.0000 mg | Freq: Once | INTRAMUSCULAR | Status: AC
  Administered 2013-01-17: 4 mg via INTRAVENOUS
  Filled 2013-01-17: qty 1

## 2013-01-17 MED ORDER — POTASSIUM CHLORIDE IN NACL 40-0.9 MEQ/L-% IV SOLN
INTRAVENOUS | Status: AC
  Administered 2013-01-17 (×2): via INTRAVENOUS

## 2013-01-17 MED ORDER — ONDANSETRON HCL 4 MG PO TABS: 4.0000 mg | ORAL_TABLET | Freq: Four times a day (QID) | ORAL | Status: DC | PRN

## 2013-01-17 MED ORDER — TIOTROPIUM BROMIDE MONOHYDRATE 18 MCG IN CAPS
18.0000 ug | ORAL_CAPSULE | Freq: Every day | RESPIRATORY_TRACT | Status: DC
Start: 2013-01-17 — End: 2013-01-21
  Administered 2013-01-18 – 2013-01-21 (×4): 18 ug via RESPIRATORY_TRACT
  Filled 2013-01-17 (×2): qty 5

## 2013-01-17 MED ORDER — SODIUM CHLORIDE 0.9 % IV SOLN
300.0000 mg | Freq: Every day | INTRAVENOUS | Status: DC
  Filled 2013-01-17 (×2): qty 6

## 2013-01-17 MED ORDER — ACETAMINOPHEN 650 MG RE SUPP: 650.0000 mg | Freq: Four times a day (QID) | RECTAL | Status: DC | PRN

## 2013-01-17 MED ORDER — ADULT MULTIVITAMIN W/MINERALS CH
1.0000 | ORAL_TABLET | Freq: Every day | ORAL | Status: DC
  Administered 2013-01-17 – 2013-01-21 (×5): 1 via ORAL
  Filled 2013-01-17 (×5): qty 1

## 2013-01-17 MED ORDER — HYDROMORPHONE HCL PF 1 MG/ML IJ SOLN
1.0000 mg | INTRAMUSCULAR | Status: DC | PRN
  Administered 2013-01-17 – 2013-01-20 (×14): 1 mg via INTRAVENOUS
  Filled 2013-01-17 (×14): qty 1

## 2013-01-17 MED ORDER — GABAPENTIN 300 MG PO CAPS
300.0000 mg | ORAL_CAPSULE | Freq: Two times a day (BID) | ORAL | Status: DC
  Administered 2013-01-17 – 2013-01-21 (×8): 300 mg via ORAL
  Filled 2013-01-17 (×8): qty 1

## 2013-01-17 MED ORDER — ONDANSETRON HCL 4 MG/2ML IJ SOLN
4.0000 mg | Freq: Four times a day (QID) | INTRAMUSCULAR | Status: DC | PRN
  Administered 2013-01-17 – 2013-01-20 (×2): 4 mg via INTRAVENOUS
  Filled 2013-01-17 (×2): qty 2

## 2013-01-17 MED ORDER — SODIUM CHLORIDE 0.9 % IV SOLN
1000.0000 mg | Freq: Once | INTRAVENOUS | Status: AC
  Administered 2013-01-17: 1000 mg via INTRAVENOUS
  Filled 2013-01-17: qty 20

## 2013-01-17 MED ORDER — NICOTINE 14 MG/24HR TD PT24
14.0000 mg | MEDICATED_PATCH | Freq: Every day | TRANSDERMAL | Status: DC
Start: 2013-01-17 — End: 2013-01-21
  Administered 2013-01-17 – 2013-01-21 (×5): 14 mg via TRANSDERMAL
  Filled 2013-01-17 (×5): qty 1

## 2013-01-17 MED ORDER — SODIUM CHLORIDE 0.9 % IJ SOLN
3.0000 mL | Freq: Two times a day (BID) | INTRAMUSCULAR | Status: DC
  Administered 2013-01-17 – 2013-01-20 (×3): 3 mL via INTRAVENOUS

## 2013-01-17 NOTE — ED Provider Notes (Signed)
CSN: [id number]     Arrival date & time 01/16/13  2232 History   First MD Initiated Contact with Patient 01/17/13 0022     Chief Complaint  Patient presents with  . Abdominal Pain   (Consider location/radiation/quality/duration/timing/severity/associated sxs/prior Treatment) HPI.... level V caveat for urgent need for intervention. Epigastric abdominal pain for 2 weeks with bloody vomitus today. Patient recently admitted for same. Patient has a Port-A-Cath and jejunostomy feeding tube.  No black stool. Nothing makes symptoms better or worse. Severity is moderate to severe. Wife reports a large amount of bloody emesis at home.  Past Medical History  Diagnosis Date  . Pancreatitis chronic 08/04/2010  . GERD (gastroesophageal reflux disease)   . Insomnia 08/04/2010  . Seizure 08/04/2010  . Chronic pain syndrome 08/04/2010  . Unspecified essential hypertension 08/04/2010  . Chronic pancreatitis 08/04/2010  . Anxiety states 08/04/2010  . Chronic airway obstruction, not elsewhere classified 08/04/2010  . Tobacco use disorder 08/04/2010  . Other abnormal glucose 08/04/2010  . Coronary artery disease    Past Surgical History  Procedure Laterality Date  . Jejunostomy feeding tube  07/13/2010  . Pancreatic pseudocyst drainage  07/13/2010  . Eus  03/26/2009    [facility]  . Portacath placement  11/28/2008    [facility]  . Gastrostomy-jejeunostomy tube change/placement  12/11/2008    [facility]  . I&d extremity Left 09/12/2012    Procedure: IRRIGATION AND DEBRIDEMENT LEFT FIFTH FINGER;  Surgeon: Roseanne Kaufman, MD;  Location: [place];  Service: Orthopedics;  Laterality: Left;  . Open reduction internal fixation (orif) metacarpal Left 09/12/2012    Procedure: OPEN REDUCTION INTERNAL FIXATION LEFT FIFTH FINGER;  Surgeon: Roseanne Kaufman, MD;  Location: [place];  Service: Orthopedics;  Laterality: Left;  . Nerve and tendon repair Left 09/12/2012    Procedure: NERVE AND TENDON REPAIR LEFT FIFTH FINGER;   Surgeon: Roseanne Kaufman, MD;  Location: [place];  Service: Orthopedics;  Laterality: Left;  . Coronary angioplasty with stent placement     History reviewed. No pertinent family history. History  Substance Use Topics  . Smoking status: Current Every Day Smoker -- 1.00 packs/day for 20 years    Types: Cigarettes  . Smokeless tobacco: Not on file     Comment: 1 pack a day since age 86  . Alcohol Use: No     Comment: No etoh since 5-6 yrs    Review of Systems  Unable to perform ROS: Acuity of condition    Allergies  Cheese  Home Medications   Current Outpatient Rx  Name  Route  Sig  Dispense  Refill  . albuterol (PROVENTIL HFA;VENTOLIN HFA) 108 (90 BASE) MCG/ACT inhaler   Inhalation   Inhale 2 puffs into the lungs every 6 (six) hours as needed for wheezing or shortness of breath.          Marland Kitchen amitriptyline (ELAVIL) 25 MG tablet   Oral   Take 25 mg by mouth at bedtime.           Marland Kitchen amLODipine (NORVASC) 10 MG tablet   Oral   Take 10 mg by mouth daily.           . cholecalciferol (VITAMIN D) 1000 UNITS tablet   Oral   Take 1,000 Units by mouth daily.           . diazepam (VALIUM) 10 MG tablet   Oral   Take 10 mg by mouth 3 (three) times daily as needed for anxiety.         Marland Kitchen  gabapentin (NEURONTIN) 300 MG capsule   Oral   Take 1,200 mg by mouth 2 (two) times daily.         Marland Kitchen HYDROcodone-acetaminophen (NORCO) 10-325 MG per tablet   Oral   Take 1 tablet by mouth every 6 (six) hours as needed for pain (hold if lethargic).         Marland Kitchen loperamide (IMODIUM) 2 MG capsule   Oral   Take 8 mg by mouth daily.         Marland Kitchen loratadine (CLARITIN) 10 MG tablet   Oral   Take 10 mg by mouth daily.           Marland Kitchen omeprazole (PRILOSEC) 40 MG capsule   Oral   Take 40 mg by mouth 2 (two) times daily.         . phenytoin (DILANTIN) 100 MG ER capsule   Oral   Take 300 mg by mouth every evening.         . potassium chloride (KLOR-CON) 10 MEQ CR tablet   Oral   Take  10 mEq by mouth daily.           Marland Kitchen tiotropium (SPIRIVA) 18 MCG inhalation capsule   Inhalation   Place 18 mcg into inhaler and inhale daily.           . traZODone (DESYREL) 100 MG tablet   Oral   Take 200 mg by mouth at bedtime.          . vitamin B-12 (CYANOCOBALAMIN) 1000 MCG tablet   Oral   Take 1,000 mcg by mouth daily.          BP 144/78  Pulse 88  Temp(Src) 97.7 F (36.5 C) (Oral)  Resp 16  Ht 5\' 6"  (1.676 m)  Wt 100 lb (45.36 kg)  BMI 16.15 kg/m2  SpO2 99% Physical Exam  Nursing note and vitals reviewed. Constitutional: He is oriented to person, place, and time. He appears well-developed and well-nourished.  HENT:  Head: Normocephalic and atraumatic.  Eyes: Conjunctivae and EOM are normal. Pupils are equal, round, and reactive to light.  Neck: Normal range of motion. Neck supple.  Cardiovascular: Normal rate, regular rhythm and normal heart sounds.   Pulmonary/Chest: Effort normal and breath sounds normal.  Abdominal: Soft. Bowel sounds are normal.  Tender epigastrium. Jejunostomy in place  Musculoskeletal: Normal range of motion.  Neurological: He is alert and oriented to person, place, and time.  Skin: Skin is warm and dry.  Psychiatric: He has a normal mood and affect. His behavior is normal.    ED Course  Procedures (including critical care time) Labs Review Labs Reviewed  CBC WITH DIFFERENTIAL - Abnormal; Notable for the following:    WBC 14.4 (*)    Neutro Abs 10.8 (*)    Monocytes Absolute 1.1 (*)    All other components within normal limits  COMPREHENSIVE METABOLIC PANEL - Abnormal; Notable for the following:    Potassium 3.3 (*)    Chloride 94 (*)    CO2 33 (*)    Glucose, Bld 134 (*)    Total Protein 9.1 (*)    Alkaline Phosphatase 131 (*)    All other components within normal limits  LIPASE, BLOOD - Abnormal; Notable for the following:    Lipase 156 (*)    All other components within normal limits  URINALYSIS, ROUTINE W REFLEX  MICROSCOPIC - Abnormal; Notable for the following:    Color, Urine AMBER (*)    Specific  Gravity, Urine >1.030 (*)    Bilirubin Urine SMALL (*)    Ketones, ur TRACE (*)    Protein, ur 100 (*)    All other components within normal limits  URINE MICROSCOPIC-ADD ON - Abnormal; Notable for the following:    Bacteria, UA MANY (*)    Casts HYALINE CASTS (*)    All other components within normal limits  TYPE AND SCREEN   Imaging Review Dg Abd Acute W/chest  01/17/2013   CLINICAL DATA:  Hematemesis.  EXAM: ACUTE ABDOMEN SERIES (ABDOMEN 2 VIEW & CHEST 1 VIEW)  COMPARISON:  Abdominal CT 01/09/2013  FINDINGS: Left-sided porta catheter in good position. No cardiomegaly. Clear lungs. No effusion or pneumothorax. Unremarkable appearance of gastrojejunostomy tube. Splenic calcifications consistent with granulomatous disease. No evidence of bowel obstruction or perforation.  IMPRESSION: 1. Nonobstructive bowel gas pattern. 2. Gastrojejunostomy tube in good position. 3. Clear chest.   Electronically Signed   By: Jorje Guild M.D.   On: 01/17/2013 02:24    EKG Interpretation   None      CRITICAL CARE Performed by: Nat Christen  ?  Total critical care time: 30  Critical care time was exclusive of separately billable procedures and treating other patients.  Critical care was necessary to treat or prevent imminent or life-threatening deterioration.  Critical care was time spent personally by me on the following activities: development of treatment plan with patient and/or surrogate as well as nursing, discussions with consultants, evaluation of patient's response to treatment, examination of patient, obtaining history from patient or surrogate, ordering and performing treatments and interventions, ordering and review of laboratory studies, ordering and review of radiographic studies, pulse oximetry and re-evaluation of patient's condition. MDM   1. Upper GI bleed   2. Pancreatitis    Patient is  hemodynamically stable. However he does have bloody vomitus. Lipase 156.  IV hydration, IV Protonix, IV Zofran. Admit to general medicine     Nat Christen, MD 01/17/13 [phone]

## 2013-01-17 NOTE — Telephone Encounter (Signed)
Left message for interventional scheduler to call me to get this scheduled

## 2013-01-17 NOTE — Progress Notes (Signed)
GI consult called to Dr [name].

## 2013-01-17 NOTE — Progress Notes (Signed)
UR chart review completed.  

## 2013-01-17 NOTE — Consult Note (Signed)
Reason for Consult: GI bleed Referring Physician: Hospitalist services  Marcus Brooks is an 53 y.o. male.  HPI: Admitted thru the ED last night. He states he started vomiting blood yesterday. He describes as a lot. He says he cannot eat. When he eats, he vomits. He tried to eat a hamburger from [place] yesterday and vomited it up.  He estimates that he  vomited about 30 times yesterday. He has vomited 6-7 times today.Marland Kitchen He says has a small amt of bright red blood in his vomitus. RN this am states he vomited coffee ground vomitus.  Discharge approx 1 week ago with diagnosis of pancreatitis.  He has a peg in place and does feeding as night (Vital AF 1.2 CA x 3 cans daily). He has a hx of upper GI bleed. Underwent an EGD in August of 2014 by Dr. Britta Mccreedy (see below). Hx of chronic pancreatitis.  C/o pain to epigastric and left  Upper quadrant. Rates pain at a 9/10. He is receiving pain medication every 4 hours. 01/18/2012 K  3.3. He is receiving NS with 40K and 75 cc hr.  Weight 110 this admission.  :EGD 08/2012:  Dr. Britta Mccreedy: hematemesis: Hiatal hernia, mild gastritis, esophagitis, No active bleeding.  Past Medical History  Diagnosis Date  . Pancreatitis chronic 08/04/2010  . GERD (gastroesophageal reflux disease)   . Insomnia 08/04/2010  . Seizure 08/04/2010  . Chronic pain syndrome 08/04/2010  . Unspecified essential hypertension 08/04/2010  . Chronic pancreatitis 08/04/2010  . Anxiety states 08/04/2010  . Chronic airway obstruction, not elsewhere classified 08/04/2010  . Tobacco use disorder 08/04/2010  . Other abnormal glucose 08/04/2010  . Coronary artery disease     Past Surgical History  Procedure Laterality Date  . Jejunostomy feeding tube  07/13/2010  . Pancreatic pseudocyst drainage  07/13/2010  . Eus  03/26/2009    [facility]  . Portacath placement  11/28/2008    [facility]  . Gastrostomy-jejeunostomy tube change/placement  12/11/2008    [facility]  . I&d extremity Left  09/12/2012    Procedure: IRRIGATION AND DEBRIDEMENT LEFT FIFTH FINGER;  Surgeon: Roseanne Kaufman, MD;  Location: [facility] [place];  Service: Orthopedics;  Laterality: Left;  . Open reduction internal fixation (orif) metacarpal Left 09/12/2012    Procedure: OPEN REDUCTION INTERNAL FIXATION LEFT FIFTH FINGER;  Surgeon: Roseanne Kaufman, MD;  Location: [facility];  Service: Orthopedics;  Laterality: Left;  . Nerve and tendon repair Left 09/12/2012    Procedure: NERVE AND TENDON REPAIR LEFT FIFTH FINGER;  Surgeon: Roseanne Kaufman, MD;  Location: [place];  Service: Orthopedics;  Laterality: Left;  . Coronary angioplasty with stent placement      History reviewed. No pertinent family history.  Social History:  reports that he has been smoking Cigarettes.  He has a 20 pack-year smoking history. He does not have any smokeless tobacco history on file. He reports that he does not drink alcohol or use illicit drugs.  Allergies:  Allergies  Allergen Reactions  . Cheese Hives    Medications: I have reviewed the patient's current medications.  Results for orders placed during the hospital encounter of 01/17/13 (from the past 48 hour(s))  CBC WITH DIFFERENTIAL     Status: Abnormal   Collection Time    01/17/13  1:06 AM      Result Value Range   WBC 14.4 (*) 4.0 - 10.5 K/uL   RBC 5.12  4.22 - 5.81 MIL/uL   Hemoglobin 16.8  13.0 - 17.0 g/dL  HCT 46.9  39.0 - 52.0 %   MCV 91.6  78.0 - 100.0 fL   MCH 32.8  26.0 - 34.0 pg   MCHC 35.8  30.0 - 36.0 g/dL   RDW 14.1  11.5 - 15.5 %   Platelets 297  150 - 400 K/uL   Neutrophils Relative % 75  43 - 77 %   Neutro Abs 10.8 (*) 1.7 - 7.7 K/uL   Lymphocytes Relative 15  12 - 46 %   Lymphs Abs 2.2  0.7 - 4.0 K/uL   Monocytes Relative 8  3 - 12 %   Monocytes Absolute 1.1 (*) 0.1 - 1.0 K/uL   Eosinophils Relative 1  0 - 5 %   Eosinophils Absolute 0.2  0.0 - 0.7 K/uL   Basophils Relative 1  0 - 1 %   Basophils Absolute 0.1  0.0 - 0.1 K/uL  COMPREHENSIVE METABOLIC PANEL     Status:  Abnormal   Collection Time    01/17/13  1:06 AM      Result Value Range   Sodium 141  137 - 147 mEq/L   Potassium 3.3 (*) 3.7 - 5.3 mEq/L   Chloride 94 (*) 96 - 112 mEq/L   CO2 33 (*) 19 - 32 mEq/L   Glucose, Bld 134 (*) 70 - 99 mg/dL   BUN 15  6 - 23 mg/dL   Creatinine, Ser 0.73  0.50 - 1.35 mg/dL   Calcium 9.8  8.4 - 10.5 mg/dL   Total Protein 9.1 (*) 6.0 - 8.3 g/dL   Albumin 3.7  3.5 - 5.2 g/dL   AST 21  0 - 37 U/L   ALT 21  0 - 53 U/L   Alkaline Phosphatase 131 (*) 39 - 117 U/L   Total Bilirubin 0.3  0.3 - 1.2 mg/dL   GFR calc non Af Amer >90  >90 mL/min   GFR calc Af Amer >90  >90 mL/min   Comment: (NOTE)     The eGFR has been calculated using the CKD EPI equation.     This calculation has not been validated in all clinical situations.     eGFR's persistently <90 mL/min signify possible Chronic Kidney     Disease.  LIPASE, BLOOD     Status: Abnormal   Collection Time    01/17/13  1:06 AM      Result Value Range   Lipase 156 (*) 11 - 59 U/L  TYPE AND SCREEN     Status: None   Collection Time    01/17/13  1:06 AM      Result Value Range   ABO/RH(D) O POS     Antibody Screen NEG     Sample Expiration 01/20/2013    URINALYSIS, ROUTINE W REFLEX MICROSCOPIC     Status: Abnormal   Collection Time    01/17/13  3:45 AM      Result Value Range   Color, Urine AMBER (*) YELLOW   Comment: BIOCHEMICALS MAY BE AFFECTED BY COLOR   APPearance CLEAR  CLEAR   Specific Gravity, Urine >1.030 (*) 1.005 - 1.030   pH 6.0  5.0 - 8.0   Glucose, UA NEGATIVE  NEGATIVE mg/dL   Hgb urine dipstick NEGATIVE  NEGATIVE   Bilirubin Urine SMALL (*) NEGATIVE   Ketones, ur TRACE (*) NEGATIVE mg/dL   Protein, ur 100 (*) NEGATIVE mg/dL   Urobilinogen, UA 0.2  0.0 - 1.0 mg/dL   Nitrite NEGATIVE  NEGATIVE   Leukocytes,  UA NEGATIVE  NEGATIVE  URINE MICROSCOPIC-ADD ON     Status: Abnormal   Collection Time    01/17/13  3:45 AM      Result Value Range   Squamous Epithelial / LPF RARE  RARE   WBC,  UA 0-2  <3 WBC/hpf   RBC / HPF 0-2  <3 RBC/hpf   Bacteria, UA MANY (*) RARE   Casts HYALINE CASTS (*) NEGATIVE   Urine-Other MUCOUS PRESENT    MRSA PCR SCREENING     Status: None   Collection Time    01/17/13  6:37 AM      Result Value Range   MRSA by PCR NEGATIVE  NEGATIVE   Comment:            The GeneXpert MRSA Assay (FDA     approved for NASAL specimens     only), is one component of a     comprehensive MRSA colonization     surveillance program. It is not     intended to diagnose MRSA     infection nor to guide or     monitor treatment for     MRSA infections.  PHENYTOIN LEVEL, TOTAL     Status: Abnormal   Collection Time    01/17/13  7:01 AM      Result Value Range   Phenytoin Lvl <2.5 (*) 10.0 - 20.0 ug/mL  PROTIME-INR     Status: None   Collection Time    01/17/13  7:01 AM      Result Value Range   Prothrombin Time 13.3  11.6 - 15.2 seconds   INR 1.03  0.00 - 1.49  LIPASE, BLOOD     Status: Abnormal   Collection Time    01/17/13  7:01 AM      Result Value Range   Lipase 103 (*) 11 - 59 U/L  CBC     Status: Abnormal   Collection Time    01/17/13  7:01 AM      Result Value Range   WBC 16.6 (*) 4.0 - 10.5 K/uL   RBC 5.05  4.22 - 5.81 MIL/uL   Hemoglobin 15.9  13.0 - 17.0 g/dL   HCT 46.7  39.0 - 52.0 %   MCV 92.5  78.0 - 100.0 fL   MCH 31.5  26.0 - 34.0 pg   MCHC 34.0  30.0 - 36.0 g/dL   RDW 14.2  11.5 - 15.5 %   Platelets 297  150 - 400 K/uL  HEMOGLOBIN AND HEMATOCRIT, BLOOD     Status: None   Collection Time    01/17/13 10:15 AM      Result Value Range   Hemoglobin 15.6  13.0 - 17.0 g/dL   HCT 45.3  39.0 - 52.0 %    Dg Abd Acute W/chest  01/17/2013   CLINICAL DATA:  Hematemesis.  EXAM: ACUTE ABDOMEN SERIES (ABDOMEN 2 VIEW & CHEST 1 VIEW)  COMPARISON:  Abdominal CT 01/09/2013  FINDINGS: Left-sided porta catheter in good position. No cardiomegaly. Clear lungs. No effusion or pneumothorax. Unremarkable appearance of gastrojejunostomy tube. Splenic  calcifications consistent with granulomatous disease. No evidence of bowel obstruction or perforation.  IMPRESSION: 1. Nonobstructive bowel gas pattern. 2. Gastrojejunostomy tube in good position. 3. Clear chest.   Electronically Signed   By: Jorje Guild M.D.   On: 01/17/2013 02:24    ROS Blood pressure 133/77, pulse 71, temperature 97.5 F (36.4 C), temperature source Axillary, resp. rate 9, height $RemoveB'5\' 6"'VtipgjSf$  (  1.676 m), weight 111 lb 15.9 oz (50.8 kg), SpO2 93.00%. Physical Exam Alert and oriented. Skin warm and dry. Oral mucosa is moist.   . Sclera anicteric, conjunctivae is pink. Thyroid not enlarged. No cervical lymphadenopathy. Lungs clear. Heart regular rate and rhythm.  Abdomen is soft. Bowel sounds are positive. No hepatomegaly. No abdominal masses felt. Diffuse tenderness to epigastric region. There is guarding.  No edema to lower extremities. Patient is very thin. He has weight loss.   Assessment/Plan: Hematemesis. Esophageal varices/ Mallory Weiss tear, PUD needs to be ruled out.  Will start tube feeding and I have spoke with his nurse. He uses Vital AF 1.2c x 3 cans at night. SETZER,TERRI W 01/17/2013, 11:15 AM     GI attending note; Patient interviewed; he is well known to me from multiple prior interventions including one from last week. He now presents with upper GI bleed. He is hemodynamically stable and his H&H is high normal. 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 Patient's examination not suggestive of variceal bleed. EGD in August 2014 at [facility] was negative for varices. Patient's PEG/PEJ is wearing out and will need to be changed. Recommendations; Continue n.p.o. status except by mouth meds. Resume antral feeding. EGD in a.m. Will arrange for PEG/PEJ change by interventional radiology.

## 2013-01-17 NOTE — ED Notes (Signed)
Patient's wife stated she was leaving and left number to call if needed. Name is Marcus Brooks, cell is [phone], home number is [phone].

## 2013-01-17 NOTE — H&P (Signed)
[facility] History and Physical  Marcus Brooks HGD:[id number] DOB: [birth year] DOA: 01/17/2013   PCP: [name], MD  Specialists: Followed by Dr. [name] with Gastroenterology.  Chief Complaint: Vomiting up blood  HPI: Marcus Brooks is a 53 y.o. male with a past medical history chronic pancreatitis with severe protein calorie malnutrition, seizure disorder, hypertension, who has a PEG tube, who was in his usual state of health till yesterday morning when he started having nausea with multiple episodes of emesis. He states that he started off by vomiting up blood. He states it was large amount and has seen clots as well. He had some dizziness, but denies any syncopal episodes. He does have chronic abdominal pain in the upper abdomen, which is about 4 of 10, but the pain has been worse since yesterday and has been 8-10 out of 10 intensity. It is a sharp pain without any radiation. He denies any fever or chills. He had a nonbloody bowel movement yesterday morning. He has chronic diarrhea. He hasn't seen any black stools. He was hospitalized recently and at that time recommendation was for PEG tube to be changed. However, it doesn't look like this has been arranged quite yet. He admits to weight loss of about 50 pounds in the last 6 months. He hasn't had any emesis in the emergency department.  Home Medications: Prior to Admission medications   Medication Sig Start Date End Date Taking? Authorizing Provider  albuterol (PROVENTIL HFA;VENTOLIN HFA) 108 (90 BASE) MCG/ACT inhaler Inhale 2 puffs into the lungs every 6 (six) hours as needed for wheezing or shortness of breath.     Historical Provider, MD  amitriptyline (ELAVIL) 25 MG tablet Take 25 mg by mouth at bedtime.      Historical Provider, MD  amLODipine (NORVASC) 10 MG tablet Take 10 mg by mouth daily.      Historical Provider, MD  cholecalciferol (VITAMIN D) 1000 UNITS tablet Take 1,000 Units by mouth daily.      Historical Provider, MD   diazepam (VALIUM) 10 MG tablet Take 10 mg by mouth 3 (three) times daily as needed for anxiety.    Historical Provider, MD  gabapentin (NEURONTIN) 300 MG capsule Take 1,200 mg by mouth 2 (two) times daily.    Historical Provider, MD  HYDROcodone-acetaminophen (NORCO) 10-325 MG per tablet Take 1 tablet by mouth every 6 (six) hours as needed for pain (hold if lethargic).    Historical Provider, MD  loperamide (IMODIUM) 2 MG capsule Take 8 mg by mouth daily.    Historical Provider, MD  loratadine (CLARITIN) 10 MG tablet Take 10 mg by mouth daily.      Historical Provider, MD  omeprazole (PRILOSEC) 40 MG capsule Take 40 mg by mouth 2 (two) times daily.    Historical Provider, MD  phenytoin (DILANTIN) 100 MG ER capsule Take 300 mg by mouth every evening.    Historical Provider, MD  potassium chloride (KLOR-CON) 10 MEQ CR tablet Take 10 mEq by mouth daily.      Historical Provider, MD  tiotropium (SPIRIVA) 18 MCG inhalation capsule Place 18 mcg into inhaler and inhale daily.      Historical Provider, MD  traZODone (DESYREL) 100 MG tablet Take 200 mg by mouth at bedtime.     Historical Provider, MD  vitamin B-12 (CYANOCOBALAMIN) 1000 MCG tablet Take 1,000 mcg by mouth daily.    Historical Provider, MD    Allergies:  Allergies  Allergen Reactions  . Cheese Hives  Past Medical History: Past Medical History  Diagnosis Date  . Pancreatitis chronic 08/04/2010  . GERD (gastroesophageal reflux disease)   . Insomnia 08/04/2010  . Seizure 08/04/2010  . Chronic pain syndrome 08/04/2010  . Unspecified essential hypertension 08/04/2010  . Chronic pancreatitis 08/04/2010  . Anxiety states 08/04/2010  . Chronic airway obstruction, not elsewhere classified 08/04/2010  . Tobacco use disorder 08/04/2010  . Other abnormal glucose 08/04/2010  . Coronary artery disease     Past Surgical History  Procedure Laterality Date  . Jejunostomy feeding tube  07/13/2010  . Pancreatic pseudocyst drainage   07/13/2010  . Eus  03/26/2009    [facility]  . Portacath placement  11/28/2008    [facility]  . Gastrostomy-jejeunostomy tube change/placement  12/11/2008    [facility]  . I&d extremity Left 09/12/2012    Procedure: IRRIGATION AND DEBRIDEMENT LEFT FIFTH FINGER;  Surgeon: Roseanne Kaufman, MD;  Location: [place];  Service: Orthopedics;  Laterality: Left;  . Open reduction internal fixation (orif) metacarpal Left 09/12/2012    Procedure: OPEN REDUCTION INTERNAL FIXATION LEFT FIFTH FINGER;  Surgeon: Roseanne Kaufman, MD;  Location: [place];  Service: Orthopedics;  Laterality: Left;  . Nerve and tendon repair Left 09/12/2012    Procedure: NERVE AND TENDON REPAIR LEFT FIFTH FINGER;  Surgeon: Roseanne Kaufman, MD;  Location: [place];  Service: Orthopedics;  Laterality: Left;  . Coronary angioplasty with stent placement      Social History: He lives by himself. Smokes one half pack cigarettes daily. Hasn't had any alcohol since Christmas. Denies any illicit drug use. Independent with daily activities.  Family History:  he doesn't know his biological parents  Review of Systems - History obtained from the patient General ROS: positive for  - fatigue Psychological ROS: negative Ophthalmic ROS: negative ENT ROS: negative Allergy and Immunology ROS: negative Hematological and Lymphatic ROS: negative Endocrine ROS: negative Respiratory ROS: no cough, shortness of breath, or wheezing Cardiovascular ROS: no chest pain or dyspnea on exertion Gastrointestinal ROS: as in hpi Genito-Urinary ROS: no dysuria, trouble voiding, or hematuria Musculoskeletal ROS: negative Neurological ROS: no TIA or stroke symptoms Dermatological ROS: negative  Physical Examination  Filed Vitals:   01/16/13 2243 01/17/13 0021 01/17/13 0439  BP: 148/78 144/78 113/73  Pulse: 100 88 80  Temp: 97.7 F (36.5 C)    TempSrc: Oral    Resp: $Remo'20 16 18  'DSLWG$ Height: $Rem'5\' 6"'rzFC$  (1.676 m)    Weight: 45.36 kg (100 lb)    SpO2: 98% 99% 95%     General appearance: alert, cooperative, appears stated age and no distress Head: Normocephalic, without obvious abnormality, atraumatic Eyes: conjunctivae/corneas clear. PERRL, EOM's intact.  Throat: lips, mucosa, and tongue normal; teeth and gums normal Neck: no adenopathy, no carotid bruit, no JVD, supple, symmetrical, trachea midline and thyroid not enlarged, symmetric, no tenderness/mass/nodules Resp: clear to auscultation bilaterally Cardio: regular rate and rhythm, S1, S2 normal, no murmur, click, rub or gallop GI: Abdomen is soft. Tender in the epigastric area without any rebound, rigidity, or guarding. No masses, organomegaly. PEG tube is noted and is discolored. Extremities: extremities normal, atraumatic, no cyanosis or edema Pulses: 2+ and symmetric Skin: Skin color, texture, turgor normal. No rashes or lesions Lymph nodes: Cervical, supraclavicular, and axillary nodes normal. Neurologic: No focal deficits  Laboratory Data: Results for orders placed during the hospital encounter of 01/17/13 (from the past 48 hour(s))  CBC WITH DIFFERENTIAL     Status: Abnormal   Collection Time  01/17/13  1:06 AM      Result Value Range   WBC 14.4 (*) 4.0 - 10.5 K/uL   RBC 5.12  4.22 - 5.81 MIL/uL   Hemoglobin 16.8  13.0 - 17.0 g/dL   HCT 46.9  39.0 - 52.0 %   MCV 91.6  78.0 - 100.0 fL   MCH 32.8  26.0 - 34.0 pg   MCHC 35.8  30.0 - 36.0 g/dL   RDW 14.1  11.5 - 15.5 %   Platelets 297  150 - 400 K/uL   Neutrophils Relative % 75  43 - 77 %   Neutro Abs 10.8 (*) 1.7 - 7.7 K/uL   Lymphocytes Relative 15  12 - 46 %   Lymphs Abs 2.2  0.7 - 4.0 K/uL   Monocytes Relative 8  3 - 12 %   Monocytes Absolute 1.1 (*) 0.1 - 1.0 K/uL   Eosinophils Relative 1  0 - 5 %   Eosinophils Absolute 0.2  0.0 - 0.7 K/uL   Basophils Relative 1  0 - 1 %   Basophils Absolute 0.1  0.0 - 0.1 K/uL  COMPREHENSIVE METABOLIC PANEL     Status: Abnormal   Collection Time    01/17/13  1:06 AM      Result Value  Range   Sodium 141  137 - 147 mEq/L   Potassium 3.3 (*) 3.7 - 5.3 mEq/L   Chloride 94 (*) 96 - 112 mEq/L   CO2 33 (*) 19 - 32 mEq/L   Glucose, Bld 134 (*) 70 - 99 mg/dL   BUN 15  6 - 23 mg/dL   Creatinine, Ser 0.73  0.50 - 1.35 mg/dL   Calcium 9.8  8.4 - 10.5 mg/dL   Total Protein 9.1 (*) 6.0 - 8.3 g/dL   Albumin 3.7  3.5 - 5.2 g/dL   AST 21  0 - 37 U/L   ALT 21  0 - 53 U/L   Alkaline Phosphatase 131 (*) 39 - 117 U/L   Total Bilirubin 0.3  0.3 - 1.2 mg/dL   GFR calc non Af Amer >90  >90 mL/min   GFR calc Af Amer >90  >90 mL/min   Comment: (NOTE)     The eGFR has been calculated using the CKD EPI equation.     This calculation has not been validated in all clinical situations.     eGFR's persistently <90 mL/min signify possible Chronic Kidney     Disease.  LIPASE, BLOOD     Status: Abnormal   Collection Time    01/17/13  1:06 AM      Result Value Range   Lipase 156 (*) 11 - 59 U/L  TYPE AND SCREEN     Status: None   Collection Time    01/17/13  1:06 AM      Result Value Range   ABO/RH(D) O POS     Antibody Screen NEG     Sample Expiration 01/20/2013    URINALYSIS, ROUTINE W REFLEX MICROSCOPIC     Status: Abnormal   Collection Time    01/17/13  3:45 AM      Result Value Range   Color, Urine AMBER (*) YELLOW   Comment: BIOCHEMICALS MAY BE AFFECTED BY COLOR   APPearance CLEAR  CLEAR   Specific Gravity, Urine >1.030 (*) 1.005 - 1.030   pH 6.0  5.0 - 8.0   Glucose, UA NEGATIVE  NEGATIVE mg/dL   Hgb urine dipstick NEGATIVE  NEGATIVE  Bilirubin Urine SMALL (*) NEGATIVE   Ketones, ur TRACE (*) NEGATIVE mg/dL   Protein, ur 100 (*) NEGATIVE mg/dL   Urobilinogen, UA 0.2  0.0 - 1.0 mg/dL   Nitrite NEGATIVE  NEGATIVE   Leukocytes, UA NEGATIVE  NEGATIVE  URINE MICROSCOPIC-ADD ON     Status: Abnormal   Collection Time    01/17/13  3:45 AM      Result Value Range   Squamous Epithelial / LPF RARE  RARE   WBC, UA 0-2  <3 WBC/hpf   RBC / HPF 0-2  <3 RBC/hpf   Bacteria, UA MANY  (*) RARE   Casts HYALINE CASTS (*) NEGATIVE   Urine-Other MUCOUS PRESENT      Radiology Reports: Dg Abd Acute W/chest  01/17/2013   CLINICAL DATA:  Hematemesis.  EXAM: ACUTE ABDOMEN SERIES (ABDOMEN 2 VIEW & CHEST 1 VIEW)  COMPARISON:  Abdominal CT 01/09/2013  FINDINGS: Left-sided porta catheter in good position. No cardiomegaly. Clear lungs. No effusion or pneumothorax. Unremarkable appearance of gastrojejunostomy tube. Splenic calcifications consistent with granulomatous disease. No evidence of bowel obstruction or perforation.  IMPRESSION: 1. Nonobstructive bowel gas pattern. 2. Gastrojejunostomy tube in good position. 3. Clear chest.   Electronically Signed   By: Jorje Guild M.D.   On: 01/17/2013 02:24    Problem List  Principal Problem:   Upper GI bleed Active Problems:   Hypertension   GERD (gastroesophageal reflux disease)   Pain around PEG tube site   Seizure disorder   Chronic pancreatitis   Assessment: This is an unfortunate 53 year old, Caucasian male, who presents with hematemesis and has upper abdominal pain. Abdominal pain is most likely an acute flareup of his chronic pancreatitis. CT was done as recently as December. At that time inflammation, was noted to be improving. He also comes in with complaints of hematemesis. Hemoglobin however, appears to be stable.  Plan: #1 hematemesis: Etiology is unclear. According to the GI notes he had EGD in January of 2013, which did not show any concerning findings. However, in another GI note, apparently, he had EGD in August of 2014, which showed gastritis and esophagitis. I can't find any records in our system. He'll be given PPI. Hemoglobin will be monitored closely. GI will be consulted. Check PT/INR. He'll be given by mouth except for medications.  #2 acute and chronic pancreatitis: Symptomatic treatment will be provided. He'll be given IV fluids. No need to repeat CT at this time. 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  #3 PEG/J Tube: Apparently, this needs to be replaced. Will defer this to gastroenterology. Feedings to be initiated by GI.  #4 history of seizure disorder: He is on Dilantin. He states that due to the nausea and vomiting he hasn't been able to take his medications. We will check a Dilantin level. Continue with his oral Dilantin for now unless he starts vomiting again. If Dilantin level is low he may have to be loaded.  #5 history of hypertension: Monitor blood pressure closely. Continue with his antihypertensive agents.   DVT Prophylaxis: SCDs Code Status: Full code Family Communication: Discussed with the patient  Disposition Plan: Admit to telemetry   Further management decisions will depend on results of further testing and patient's response to treatment.  [facility]  [facility] Pager [phone]  If 7PM-7AM, please contact night-coverage [email or web address] Password [email or web address]  01/17/2013, 4:47 AM

## 2013-01-17 NOTE — ED Notes (Signed)
Brought patient back from waiting area patient was drinking [place]Dew. Instructed patient not to continue to drink or eat anything til seen by ER doctor.

## 2013-01-17 NOTE — ED Notes (Signed)
Patient back from x-ray complaining of worsening pain. Advised MD. Verbal order for dilaudid 1mg  IV now.

## 2013-01-17 NOTE — Progress Notes (Signed)
Pt is stating that he feels "something crawling" on his penis. He doesn't see anything or feel anything anywhere else. He says the feeling comes and goes. I assessed the area and was not able to visualize anything that would cause him to feel that. Pt also stated that his wife "is stepping out" on him and he is concerned. MD has been made aware. Will continue to monitor.

## 2013-01-17 NOTE — Progress Notes (Signed)
Patient with history of alcoholic pancreatitis requiring GJ tube placement for nutrition, seizure disorder on Dilantin, chronic abdominal pain and narcotic dependence, chronic left-sided Port-A-Cath placement for IV access.   He was admitted few hours ago for reoccurrence of upper GI bleed, he is been following with GI physician Dr. [name] issues with PEG tube and it was scheduled to be change in the outpatient setting in the next week or so. He comes back with episodes of hematemesis. He is currently on IV PPI with stable H&H which is being monitored, type cross has been done. Have informed GI to see the patient today. He likely will require PEG tube placement either this admission or in the outpatient setting soon. Will flush his PEG tube with 500 cc of water and placed to gravity to see if there is any ongoing GI bleed.    For his seizures he's been on Dilantin levels were subtherapeutic, have given him 1 g of IV Dilantin and switched him to maintenance IV dose from tomorrow. He is seizure-free this admission.    Potassium was low this admission which will be replaced in IV fluids.    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

## 2013-01-18 ENCOUNTER — Encounter (HOSPITAL_COMMUNITY): Payer: Self-pay | Admitting: *Deleted

## 2013-01-18 ENCOUNTER — Inpatient Hospital Stay (HOSPITAL_COMMUNITY): Payer: Medicaid Other

## 2013-01-18 ENCOUNTER — Encounter (HOSPITAL_COMMUNITY)
Admission: EM | Disposition: A | Discharge status: Home Health | Payer: Self-pay | Source: Home / Self Care | Attending: Family Medicine

## 2013-01-18 DIAGNOSIS — K861 Other chronic pancreatitis: Secondary | ICD-10-CM

## 2013-01-18 DIAGNOSIS — I2510 Atherosclerotic heart disease of native coronary artery without angina pectoris: Secondary | ICD-10-CM | POA: Insufficient documentation

## 2013-01-18 DIAGNOSIS — K9422 Gastrostomy infection: Secondary | ICD-10-CM

## 2013-01-18 DIAGNOSIS — K449 Diaphragmatic hernia without obstruction or gangrene: Secondary | ICD-10-CM

## 2013-01-18 DIAGNOSIS — K219 Gastro-esophageal reflux disease without esophagitis: Secondary | ICD-10-CM

## 2013-01-18 DIAGNOSIS — I82890 Acute embolism and thrombosis of other specified veins: Secondary | ICD-10-CM | POA: Diagnosis present

## 2013-01-18 DIAGNOSIS — D62 Acute posthemorrhagic anemia: Secondary | ICD-10-CM

## 2013-01-18 HISTORY — PX: ESOPHAGOGASTRODUODENOSCOPY: SHX5428

## 2013-01-18 LAB — COMPREHENSIVE METABOLIC PANEL
ALT: 17 U/L (ref 0–53)
AST: 17 U/L (ref 0–37)
Albumin: 2.9 g/dL — ABNORMAL LOW (ref 3.5–5.2)
Alkaline Phosphatase: 111 U/L (ref 39–117)
BUN: 26 mg/dL — ABNORMAL HIGH (ref 6–23)
CO2: 29 mEq/L (ref 19–32)
Calcium: 8.8 mg/dL (ref 8.4–10.5)
Chloride: 100 mEq/L (ref 96–112)
Creatinine, Ser: 1 mg/dL (ref 0.50–1.35)
GFR calc Af Amer: >90 mL/min (ref >90)
GFR calc non Af Amer: 85 mL/min — ABNORMAL LOW (ref >90)
Glucose, Bld: 78 mg/dL (ref 70–99)
Potassium: 4.4 mEq/L (ref 3.7–5.3)
SODIUM: 140 meq/L (ref 137–147)
TOTAL PROTEIN: 7.5 g/dL (ref 6.0–8.3)
Total Bilirubin: 0.3 mg/dL (ref 0.3–1.2)

## 2013-01-18 LAB — CBC
HEMATOCRIT: 38.4 % — AB (ref 39.0–52.0)
HEMOGLOBIN: 12.9 g/dL — AB (ref 13.0–17.0)
MCH: 31.5 pg (ref 26.0–34.0)
MCHC: 33.6 g/dL (ref 30.0–36.0)
MCV: 93.9 fL (ref 78.0–100.0)
Platelets: 269 10*3/uL (ref 150–400)
RBC: 4.09 MIL/uL — ABNORMAL LOW (ref 4.22–5.81)
RDW: 14.2 % (ref 11.5–15.5)
WBC: 14.8 10*3/uL — ABNORMAL HIGH (ref 4.0–10.5)

## 2013-01-18 LAB — PHENYTOIN LEVEL, TOTAL: Phenytoin Lvl: 11.6 ug/mL (ref 10.0–20.0)

## 2013-01-18 SURGERY — EGD (ESOPHAGOGASTRODUODENOSCOPY)
Anesthesia: Moderate Sedation

## 2013-01-18 MED ORDER — BUTAMBEN-TETRACAINE-BENZOCAINE 2-2-14 % EX AERO
INHALATION_SPRAY | CUTANEOUS | Status: DC | PRN
  Administered 2013-01-18: 2 via TOPICAL

## 2013-01-18 MED ORDER — STERILE WATER FOR IRRIGATION IR SOLN
Status: DC | PRN
  Administered 2013-01-18: 16:00:00

## 2013-01-18 MED ORDER — MEPERIDINE HCL 50 MG/ML IJ SOLN
INTRAMUSCULAR | Status: AC
  Filled 2013-01-18: qty 1

## 2013-01-18 MED ORDER — MIDAZOLAM HCL 5 MG/5ML IJ SOLN
INTRAMUSCULAR | Status: AC
  Filled 2013-01-18: qty 10

## 2013-01-18 MED ORDER — MIDAZOLAM HCL 5 MG/5ML IJ SOLN
INTRAMUSCULAR | Status: DC | PRN
  Administered 2013-01-18: 2 mg via INTRAVENOUS
  Administered 2013-01-18: 3 mg via INTRAVENOUS
  Administered 2013-01-18: 2 mg via INTRAVENOUS

## 2013-01-18 MED ORDER — SODIUM CHLORIDE 0.9 % IV SOLN
INTRAVENOUS | Status: DC
  Administered 2013-01-18: 1000 mL via INTRAVENOUS

## 2013-01-18 MED ORDER — PHENYTOIN SODIUM 50 MG/ML IJ SOLN
100.0000 mg | Freq: Three times a day (TID) | INTRAMUSCULAR | Status: DC
  Administered 2013-01-18 – 2013-01-19 (×3): 100 mg via INTRAVENOUS
  Filled 2013-01-18 (×3): qty 2

## 2013-01-18 MED ORDER — MEPERIDINE HCL 50 MG/ML IJ SOLN
INTRAMUSCULAR | Status: DC | PRN
  Administered 2013-01-18 (×2): 25 mg via INTRAVENOUS

## 2013-01-18 MED ORDER — FAMOTIDINE 20 MG PO TABS
20.0000 mg | ORAL_TABLET | Freq: Once | ORAL | Status: AC
  Administered 2013-01-18: 20 mg via ORAL
  Filled 2013-01-18: qty 1

## 2013-01-18 MED ORDER — CHLORPROMAZINE HCL 25 MG/ML IJ SOLN
25.0000 mg | Freq: Once | INTRAMUSCULAR | Status: AC
  Administered 2013-01-18: 25 mg via INTRAMUSCULAR
  Filled 2013-01-18: qty 2

## 2013-01-18 NOTE — Progress Notes (Signed)
[facility] PROGRESS NOTE  GASPAR FOWLE BPZ:[id number] DOB: 05-17-1960 DOA: 01/17/2013 PCP: [name], MD  Assessment/Plan: #1 hematemesis: One episode this am. Pt is hemodynamically stable. According to the GI notes he had EGD in January of 2013, which did not show any concerning findings. However, in another GI note, apparently, he had EGD in August of 2014, which showed gastritis and esophagitis. Appreciate Dr. [name] assistance. EGD scheduled for today. Continue PPI. Hemoglobin high end of normal. INR 1.03. Continue to monitor closely.   #2 acute and chronic pancreatitis: Lipase 103 on admission. Continue symptomatic treatment. Continue IV fluids. No need to repeat CT at this time. 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   #3 PEG/J Tube: Apparently, this needs to be replaced. Appreciate GI assistance with arranging this per IR. Night tube feedings resumed per GI.  #4 history of seizure disorder: No seizure activity.  He is on Dilantin. He states that due to the nausea and vomiting he was not able to take his medications. Dilantin level <2.5 yesterday. Loading dose of dilantin given and  IV dilantin daily until #1 resolved.   #5 history of hypertension: SBP range 124-156. Home medications include norvasc which is on hold for now.  Continue to monitor blood pressure closely and resume when indicated.   #6 Leukocytosis: trending down. Chest clear on admission per xray. Will repeat urinalysis. PEG tube itself looks dark but site is without erythema or swelling. Likely related to #2. He is afebrile and non-toxic appearing.    Code Status: full Family Communication: wife at bedside Disposition Plan: home when ready hopefully 24-48 hours   Consultants:  Dr [name] GI  Procedures:  EGD 01/18/13  Antibiotics:  none  HPI/Subjective: Sitting up in bed alert. Complains nausea and  abdominal pain. Reports pain medicine helps.   Objective: Filed Vitals:   01/18/13 0534  BP: 125/60  Pulse: 75  Temp: 98.2 F (36.8 C)  Resp: 18    Intake/Output Summary (Last 24 hours) at 01/18/13 0915 Last data filed at 01/18/13 0900  Gross per 24 hour  Intake 577.25 ml  Output    500 ml  Net  77.25 ml   Filed Weights   01/16/13 2243 01/17/13 0555  Weight: 45.36 kg (100 lb) 50.8 kg (111 lb 15.9 oz)    Exam:   General:  He is thin and frail and chronically ill appearing in no acute distress.   Cardiovascular: Heart sounds with S1 and S2, i hear no murmur gallup or rub. There is no LE edema  Respiratory: there is normal respiratory effort. BS are clear bilaterally to auscultation. i hear no wheeze or rhonchi  Abdomen: is flat and soft with very sluggish bowel sounds. There is diffuse tenderness to very light palpation particularly in epigastric area. There is no rebounding or guarding  Musculoskeletal:joints are without erythema or swelling.   Data Reviewed: Basic Metabolic Panel:  Recent Labs Lab 01/17/13 0106 01/18/13 0507  NA 141 140  K 3.3* 4.4  CL 94* 100  CO2 33* 29  GLUCOSE 134* 78  BUN 15 26*  CREATININE 0.73 1.00  CALCIUM 9.8 8.8   Liver Function Tests:  Recent Labs Lab 01/17/13 0106 01/18/13 0507  AST 21 17  ALT 21 17  ALKPHOS 131* 111  BILITOT 0.3 0.3  PROT 9.1* 7.5  ALBUMIN 3.7 2.9*    Recent Labs Lab 01/17/13 0106 01/17/13  0701  LIPASE 156* 103*   No results found for this basename: AMMONIA,  in the last 168 hours CBC:  Recent Labs Lab 01/17/13 0106 01/17/13 0701 01/17/13 1015 01/17/13 1609 01/17/13 2144 01/18/13 0507  WBC 14.4* 16.6*  --   --   --  14.8*  NEUTROABS 10.8*  --   --   --   --   --   HGB 16.8 15.9 15.6 14.9 13.7 12.9*  HCT 46.9 46.7 45.3 43.5 41.2 38.4*  MCV 91.6 92.5  --   --   --  93.9  PLT 297 297  --   --   --  269   Cardiac Enzymes: No results found for this basename: CKTOTAL, CKMB, CKMBINDEX,  TROPONINI,  in the last 168 hours BNP (last 3 results) No results found for this basename: PROBNP,  in the last 8760 hours CBG: No results found for this basename: GLUCAP,  in the last 168 hours  Recent Results (from the past 240 hour(s))  MRSA PCR SCREENING     Status: None   Collection Time    01/17/13  6:37 AM      Result Value Range Status   MRSA by PCR NEGATIVE  NEGATIVE Final   Comment:            The GeneXpert MRSA Assay (FDA     approved for NASAL specimens     only), is one component of a     comprehensive MRSA colonization     surveillance program. It is not     intended to diagnose MRSA     infection nor to guide or     monitor treatment for     MRSA infections.     Studies: Dg Abd Acute W/chest  01/17/2013   CLINICAL DATA:  Hematemesis.  EXAM: ACUTE ABDOMEN SERIES (ABDOMEN 2 VIEW & CHEST 1 VIEW)  COMPARISON:  Abdominal CT 01/09/2013  FINDINGS: Left-sided porta catheter in good position. No cardiomegaly. Clear lungs. No effusion or pneumothorax. Unremarkable appearance of gastrojejunostomy tube. Splenic calcifications consistent with granulomatous disease. No evidence of bowel obstruction or perforation.  IMPRESSION: 1. Nonobstructive bowel gas pattern. 2. Gastrojejunostomy tube in good position. 3. Clear chest.   Electronically Signed   By: Jorje Guild M.D.   On: 01/17/2013 02:24    Scheduled Meds: . amitriptyline  25 mg Oral QHS  . feeding supplement (JEVITY 1.2 CAL)  1,000 mL Per Tube Q24H  . folic acid  1 mg Oral Daily  . gabapentin  300 mg Oral BID  . multivitamin with minerals  1 tablet Oral Daily  . nicotine  14 mg Transdermal Daily  . pantoprazole (PROTONIX) IV  40 mg Intravenous Q12H  . phenytoin (DILANTIN) IV  300 mg Intravenous Daily  . sodium chloride  3 mL Intravenous Q12H  . thiamine  100 mg Oral Daily   Or  . thiamine  100 mg Intravenous Daily  . tiotropium  18 mcg Inhalation Daily  . traZODone  100 mg Oral QHS   Continuous Infusions: . 0.9 %  NaCl with KCl 40 mEq / L 75 mL/hr at 01/17/13 2354    Principal Problem:   Upper GI bleed Active Problems:   Hypertension   GERD (gastroesophageal reflux disease)   Pain around PEG tube site   Hematemesis   Seizure disorder   Protein-calorie malnutrition, severe   Chronic pancreatitis    Time spent: 40 minutes    [facility] Pager  [phone]. If 7PM-7AM, please contact night-coverage at [email or web address], password [facility] 01/18/2013, 9:15 AM  LOS: 1 day

## 2013-01-18 NOTE — Care Management Note (Unsigned)
    Page 1 of 2   01/20/2013     2:54:25 PM   CARE MANAGEMENT NOTE 01/20/2013  Patient:  Marcus Brooks, Marcus Brooks   Account Number:  [id number]  Date Initiated:  01/18/2013  Documentation initiated by:  Theophilus Kinds  Subjective/Objective Assessment:   Pt admitted from home with pancreatitis and gi bleed. Pt stated that he and his wife are separated and he is living in a boarding house. Pt stated that he has no heat or food. Pt is a chronic PEG tube feeder and takes care of that himself.     Action/Plan:   [facility] nurse gave pt resources for food pantry in [place]. Pt encouraged to go to DSS of [facility] to get help with food stamps. Pt stated he has a radiator heater and hot plate. Would like HH RN and CSW at discharge.   Anticipated DC Date:  01/20/2013   Anticipated DC Plan:  [place]  CM consult      [facility] Choice  HOME HEALTH   Choice offered to / List presented to:  C-1 Patient        [place] arranged  HH-1 RN  [place].   Status of service:  Completed, signed off Medicare Important Message given?   (If response is "NO", the following Medicare IM given date fields will be blank) Date Medicare IM given:   Date Additional Medicare IM given:    Discharge Disposition:  [place]  Per UR Regulation:    If discussed at Long Length of Stay Meetings, dates discussed:    Comments:  01/20/13 [place], RN BSN CM Pt discharging home today. Pt has been given list of food resources in [place] and list of shelters in [place] along with the [place] for help with better housing. Pt [place] RN and CSW has been arranged with [facility] and Romualdo Bolk of [facility] will collect the pts information from the chart. [place] services to start within 48 hours of discharge.Pt and pts nurse aware of discharge arrangement.  01/18/13 [place], RN BSN CM

## 2013-01-18 NOTE — Op Note (Signed)
EGD PROCEDURE REPORT  PATIENT:  Marcus Brooks  MR#:  [id number] Birthdate:  07/27/1960, 53 y.o., male Endoscopist:  Dr. Rogene Houston, MD Referred By:  Dr. Dr. Lala Lund, MD Procedure Date: 01/18/2013  Procedure:   EGD  Indications:  Patient is 53 year old Caucasian male with chronic calcific pancreatitis who was admitted with upper GI bleed early yesterday morning. He has not experienced any more episodes of hematemesis.            Informed Consent:  The risks, benefits, alternatives & imponderables which include, but are not limited to, bleeding, infection, perforation, drug reaction and potential missed lesion have been reviewed.  The potential for biopsy, lesion removal, esophageal dilation, etc. have also been discussed.  Questions have been answered.  All parties agreeable.  Please see history & physical in medical record for more information.  Medications:  Demerol 50 mg IV Versed 7 mg IV Cetacaine spray topically for oropharyngeal anesthesia  Description of procedure:  The endoscope was introduced through the mouth and advanced to the second portion of the duodenum without difficulty or limitations. The mucosal surfaces were surveyed very carefully during advancement of the scope and upon withdrawal.  Findings:  Esophagus:  Mucosa of the esophagus was normal. No indices were present. GE junction unremarkable. GEJ:  40 cm Hiatus:  43 cm  Stomach:  Stomach was empty and distended very well with insufflation. G-tube in place along with jejunal feeding tube extending into pylorus and beyond. Folds in the proximal stomach were prominent. Mucosa at body antrum pyloric channel as well as angularis fundus and cardia was unremarkable. No varices or Mallory-Weiss tear noted. Duodenum:  Jejunal feeding tube present. Bulbar and post bulbar mucosa was unremarkable.  Therapeutic/Diagnostic Maneuvers Performed:  None  Complications:  None  Impression: Small sliding hiatal  hernia. Prominent gastric folds but no evidence of Mallory-Weiss tear gastric varices or peptic ulcer disease. PEG/PEJ in place. Both tubes noted to have debris externally and internally.  Recommendations:  Clear liquids. Resume jejunal feeding. Patient scheduled to have PEG/PEJ changed tomorrow at [facility] radiology.  Becka Lagasse U  01/18/2013  4:40 PM  CC: Dr. [name], MD & Dr. Rayne Du ref. provider found

## 2013-01-18 NOTE — Progress Notes (Signed)
The patient was seen and examined. He was discussed with nurse practitioner, Ms. Renard Hamper. Agree with her assessment and plan. The patient is just back from EGD. Dr. [name] inform me of the results. There was no obvious evidence of Mallory-Weiss tear, gastric varices, or peptic ulcer disease. The patient did have a small hiatal hernia. There was no evidence of bleeding sequelae. Dr. [name] has made arrangements to have the patient's PEG tube changed by IR at [facility] tomorrow. Clear liquid diet started. We'll continue IV Protonix. Lipase was not ordered today but has been trending down. We'll order another lipase tomorrow morning. The patient has minimal epigastric tenderness on exam. Of note, his hemoglobin has drifted down some which is from likely a combination of GI blood loss and hemodilution from IV fluids. His white blood cell count is still marginally elevated, but he is afebrile. His urinalysis yesterday was not indicative of infection. We'll order a portable chest x-ray to rule out infiltrate. Hold off on starting antibiotics for now. Leukocytosis may be demargination or stress-related.

## 2013-01-18 NOTE — Progress Notes (Signed)
Marcus Brooks is a 53 y.o. male patient who transferred  from ENDO awake, alert  & orientated  X 4, Full Code, VSS - Blood pressure 127/74, pulse 86, temperature 97.4 F (36.3 C), temperature source Oral, resp. rate 12, height 5\' 6"  (1.676 m), weight 50.8 kg (111 lb 15.9 oz), SpO2 98.00%.no c/o shortness of breath, no c/o chest pain, no distress noted. Tele # 21 placed and pt is currently running:  NSR.   IV site WDL: Left porta cath with a transparent dsg that's clean dry and intact.  Allergies:   Allergies  Allergen Reactions  . Cheese Hives     Past Medical History  Diagnosis Date  . Pancreatitis chronic 08/04/2010  . GERD (gastroesophageal reflux disease)   . Insomnia 08/04/2010  . Seizure 08/04/2010  . Chronic pain syndrome 08/04/2010  . Unspecified essential hypertension 08/04/2010  . Chronic pancreatitis 08/04/2010  . Anxiety states 08/04/2010  . Chronic airway obstruction, not elsewhere classified 08/04/2010  . Tobacco use disorder 08/04/2010  . Other abnormal glucose 08/04/2010  . Coronary artery disease   . Splenic vein thrombosis     Chronic.    Pt orientation to unit, room and routine. SR up x 2, Patient verbalizing understanding of risks associated with falls. Pt verbalizes an understanding of how to use the call bell and to call for help before getting out of bed.  Skin, clean-dry- intact without evidence of bruising, or skin tears.   No evidence of skin break down noted on exam.    Will cont to monitor and assist as needed.  Regino Bellow, RN 01/18/2013 4:57 PM

## 2013-01-18 NOTE — Progress Notes (Addendum)
INITIAL NUTRITION ASSESSMENT  DOCUMENTATION CODES Per approved criteria  -Severe malnutrition in the context of chronic disease -Underweight   INTERVENTION:   (On hold)-Continuous Jevity 1.2 $RemoveBe'@35'xpNUkCBGV$  ml/hr via PEG/PEJ is providing 1008 kcal, 46.6 gr protein and 678 ml water q 24 hr.    If pt is unable to resume/tolerate oral intake; recommend titrate enteral nutrition to goal rate of 70 ml/hr, tube feeding regimen will provide 2016  kcal, 93  grams of protein, and 1356 ml of H2O.   IVF's- NS $RemoveB'@75'KADZNlZd$  ml/hr. If no IVF's, add free water flushes of 200 ml TID.  NUTRITION DIAGNOSIS: 1.Inadequate oral intake related to altered GI function as evidenced by upper GI bleed, chronic pancreatitis, NPO status and severe unplanned wt loss of 21#, 16% x 100 days.  Goal: Pt to meet >/= 90% of their estimated nutrition needs ; not met  Monitor:  Po intake, labs and wt trends  Reason for Assessment: Home TF  53 y.o. male  Admitting Dx: Upper GI bleed  ASSESSMENT: Pt is 53 yo male whose hx significant for pancreatitis related to ETOH abuse, chronic abdominal pain. He presents with hematemesis and unable to tolerate oral nutrition. His is NPO currently for EGD today and TF is on hold. Assessed by RD on 01/10/13 pt presented with significant weight loss of 10#, 8% x 90 days. His current wt reflects additional wt loss of 11#, 9% x 7 days for total of 21#,16% x 100 days.  Pt is s/p jejunostomy placement 12/11/2008. His home EN is Vital AF 1.2 (3 cans or 711 ml /day) from 2100-0700 daily. Also receives supplemental folic acid, thiamine and MVI daily.  He is due for new PEG/PEJ per GI note and will be placed by IR.   Based on nutrition focused exam pt has moderate muscle wasting (clavicle, patellar) and also (thoracic & orbital) subcutaneous fat depletion and meets criteria for severe malnutrition.  Height: Ht Readings from Last 1 Encounters:  01/17/13 $RemoveB'5\' 6"'UDoZdiqi$  (1.676 m)    Weight: Wt Readings from Last 1  Encounters:  01/17/13 111 lb 15.9 oz (50.8 kg)    Ideal Body Weight: 142#   % Ideal Body Weight: 79%  Wt Readings from Last 10 Encounters:  01/17/13 111 lb 15.9 oz (50.8 kg)  01/17/13 111 lb 15.9 oz (50.8 kg)  01/09/13 123 lb (55.792 kg)  10/04/12 133 lb (60.328 kg)  03/09/11 148 lb 4.8 oz (67.268 kg)  01/29/11 145 lb (65.772 kg)  01/29/11 145 lb (65.772 kg)  11/05/10 145 lb 4.8 oz (65.908 kg)  08/13/10 139 lb 14.4 oz (63.458 kg)  08/04/10 140 lb 9.6 oz (63.776 kg)    Usual Body Weight: 133#  % Usual Body Weight: 84%  BMI:  Body mass index is 18.08 kg/(m^2).underweight  Estimated Nutritional Needs: Kcal: 1800-2100 Protein: 80-90 gr Fluid: 1800 ml daily  Skin: intact  Diet Order: NPO  EDUCATION NEEDS: -Education needs addressed   Intake/Output Summary (Last 24 hours) at 01/18/13 0819 Last data filed at 01/17/13 1847  Gross per 24 hour  Intake 577.25 ml  Output      0 ml  Net 577.25 ml    Last BM: 01/16/13  Labs:   Recent Labs Lab 01/17/13 0106 01/18/13 0507  NA 141 140  K 3.3* 4.4  CL 94* 100  CO2 33* 29  BUN 15 26*  CREATININE 0.73 1.00  CALCIUM 9.8 8.8  GLUCOSE 134* 78    CBG (last 3)  No results found for  this basename: GLUCAP,  in the last 72 hours  Scheduled Meds: . amitriptyline  25 mg Oral QHS  . feeding supplement (JEVITY 1.2 CAL)  1,000 mL Per Tube Q24H  . folic acid  1 mg Oral Daily  . gabapentin  300 mg Oral BID  . multivitamin with minerals  1 tablet Oral Daily  . nicotine  14 mg Transdermal Daily  . pantoprazole (PROTONIX) IV  40 mg Intravenous Q12H  . phenytoin (DILANTIN) IV  300 mg Intravenous Daily  . sodium chloride  3 mL Intravenous Q12H  . thiamine  100 mg Oral Daily   Or  . thiamine  100 mg Intravenous Daily  . tiotropium  18 mcg Inhalation Daily  . traZODone  100 mg Oral QHS    Continuous Infusions: . 0.9 % NaCl with KCl 40 mEq / L 75 mL/hr at 01/17/13 2354    Past Medical History  Diagnosis Date  .  Pancreatitis chronic 08/04/2010  . GERD (gastroesophageal reflux disease)   . Insomnia 08/04/2010  . Seizure 08/04/2010  . Chronic pain syndrome 08/04/2010  . Unspecified essential hypertension 08/04/2010  . Chronic pancreatitis 08/04/2010  . Anxiety states 08/04/2010  . Chronic airway obstruction, not elsewhere classified 08/04/2010  . Tobacco use disorder 08/04/2010  . Other abnormal glucose 08/04/2010  . Coronary artery disease     Past Surgical History  Procedure Laterality Date  . Jejunostomy feeding tube  07/13/2010  . Pancreatic pseudocyst drainage  07/13/2010  . Eus  03/26/2009    [facility]  . Portacath placement  11/28/2008    [facility]  . Gastrostomy-jejeunostomy tube change/placement  12/11/2008    [facility]  . I&d extremity Left 09/12/2012    Procedure: IRRIGATION AND DEBRIDEMENT LEFT FIFTH FINGER;  Surgeon: Roseanne Kaufman, MD;  Location: [place];  Service: Orthopedics;  Laterality: Left;  . Open reduction internal fixation (orif) metacarpal Left 09/12/2012    Procedure: OPEN REDUCTION INTERNAL FIXATION LEFT FIFTH FINGER;  Surgeon: Roseanne Kaufman, MD;  Location: [place];  Service: Orthopedics;  Laterality: Left;  . Nerve and tendon repair Left 09/12/2012    Procedure: NERVE AND TENDON REPAIR LEFT FIFTH FINGER;  Surgeon: Roseanne Kaufman, MD;  Location: [place];  Service: Orthopedics;  Laterality: Left;  . Coronary angioplasty with stent placement      Colman Cater MS,RD,CSG,LDN Office: #[phone] Pager: [phone]

## 2013-01-19 ENCOUNTER — Encounter (HOSPITAL_COMMUNITY): Payer: Self-pay | Admitting: Internal Medicine

## 2013-01-19 ENCOUNTER — Other Ambulatory Visit (HOSPITAL_COMMUNITY): Payer: Medicaid Other

## 2013-01-19 DIAGNOSIS — K920 Hematemesis: Secondary | ICD-10-CM

## 2013-01-19 DIAGNOSIS — E871 Hypo-osmolality and hyponatremia: Secondary | ICD-10-CM | POA: Diagnosis not present

## 2013-01-19 DIAGNOSIS — K449 Diaphragmatic hernia without obstruction or gangrene: Secondary | ICD-10-CM | POA: Diagnosis present

## 2013-01-19 LAB — CBC
HEMATOCRIT: 36.6 % — AB (ref 39.0–52.0)
Hemoglobin: 12.2 g/dL — ABNORMAL LOW (ref 13.0–17.0)
MCH: 31 pg (ref 26.0–34.0)
MCHC: 33.3 g/dL (ref 30.0–36.0)
MCV: 93.1 fL (ref 78.0–100.0)
Platelets: 236 10*3/uL (ref 150–400)
RBC: 3.93 MIL/uL — ABNORMAL LOW (ref 4.22–5.81)
RDW: 13.9 % (ref 11.5–15.5)
WBC: 11.5 10*3/uL — ABNORMAL HIGH (ref 4.0–10.5)

## 2013-01-19 LAB — BASIC METABOLIC PANEL
BUN: 12 mg/dL (ref 6–23)
CALCIUM: 8.8 mg/dL (ref 8.4–10.5)
CO2: 25 mEq/L (ref 19–32)
CREATININE: 0.72 mg/dL (ref 0.50–1.35)
Chloride: 100 mEq/L (ref 96–112)
Glucose, Bld: 97 mg/dL (ref 70–99)
Potassium: 4.1 mEq/L (ref 3.7–5.3)
Sodium: 136 mEq/L — ABNORMAL LOW (ref 137–147)

## 2013-01-19 LAB — LIPASE, BLOOD: Lipase: 161 U/L — ABNORMAL HIGH (ref 11–59)

## 2013-01-19 MED ORDER — PHENYTOIN SODIUM EXTENDED 100 MG PO CAPS
300.0000 mg | ORAL_CAPSULE | Freq: Every day | ORAL | Status: DC
  Administered 2013-01-19 – 2013-01-20 (×2): 300 mg via ORAL
  Filled 2013-01-19 (×2): qty 3

## 2013-01-19 MED ORDER — JEVITY 1.2 CAL PO LIQD
ORAL | Status: AC
  Filled 2013-01-19: qty 237

## 2013-01-19 MED ORDER — JEVITY 1.2 CAL PO LIQD
1000.0000 mL | ORAL | Status: DC
  Administered 2013-01-19: 1000 mL
  Filled 2013-01-19 (×5): qty 1000

## 2013-01-19 MED ORDER — HYDROCODONE-ACETAMINOPHEN 10-325 MG PO TABS: 1.0000 | ORAL_TABLET | Freq: Four times a day (QID) | ORAL | Status: DC | PRN

## 2013-01-19 MED ORDER — JEVITY 1.2 CAL PO LIQD: 1000.0000 mL | ORAL | Status: DC

## 2013-01-19 NOTE — Telephone Encounter (Signed)
Patient sch'd 01/19/13

## 2013-01-19 NOTE — Progress Notes (Signed)
[facility] PROGRESS NOTE  Marcus Brooks HWE:[id number] DOB: [birth year] DOA: 01/17/2013 PCP: [name], MD  Assessment/Plan: #1 hematemesis: No further episodes. Per  GI notes he had EGD in January of 2013, which did not show any concerning findings. However, in another GI note, apparently, he had EGD in August of 2014, which showed gastritis and esophagitis. S/P EGD 01/18/13. Results yield small sliding hiatal hernia. Prominent gastric folds but no evidence of Mallory-Weiss tear gastric varices or peptic ulcer disease.  PEG/PEJ in place. Both tubes noted to have debris externally and internally per dr. [name]. He was scheduled to go to [facility] for tube replacement this afternoon. Procedure cancelled per IR. Tolerating jejunal feedings as well as clear liquid diet. Continue PPI. Hemoglobin high end of normal. INR 1.03. Continue to monitor closely. Discussed with Dr. [name] and if tolerates feedings may be ready for discharge in am.   #2 acute and chronic pancreatitis: Lipase trending up today at 161. Continue symptomatic treatment. Tolerating clear liquids. No need to repeat CT at this time. 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   #3 PEG/J Tube: See above. Procedure to replace tube cancelled.  Tube feedings resumed per GI.   #4 history of seizure disorder: No seizure activity. He is on Dilantin. He states that due to the nausea and vomiting he was not able to take his medications. Dilantin level <2.5 01/17/13. Loading dose of dilantin given and IV dilantin daily until #1 resolved.   #5 history of hypertension: SBP range 134-142. Home medications include norvasc which is on hold for now. Continue to monitor blood pressure closely and resume when indicated.   #6 Leukocytosis: Continues to trend downward. Chest clear on admission per xray. PEG tube itself looks dark but site is without erythema or  swelling. Likely related to #2. He is afebrile and non-toxic appearing.   #7. Hyponatremia: mild. Likely related to decreased po intake. Will monitor   Code Status: full Family Communication: wife at bedside Disposition Plan: home when ready    Consultants:  GI Dr [name]  Interventional radiology  Procedures:  EGD 01/18/13  Replacement of PEG tube 01/19/13  Antibiotics:  none  HPI/Subjective: Sitting watching TV. Reports feeling "a little" better.   Objective: Filed Vitals:   01/19/13 0425  BP: 134/77  Pulse: 68  Temp: 98.1 F (36.7 C)  Resp: 16    Intake/Output Summary (Last 24 hours) at 01/19/13 1022 Last data filed at 01/19/13 0900  Gross per 24 hour  Intake 924.42 ml  Output    450 ml  Net 474.42 ml   Filed Weights   01/16/13 2243 01/17/13 0555  Weight: 45.36 kg (100 lb) 50.8 kg (111 lb 15.9 oz)    Exam:   General:  Thin, calm comfortable  Cardiovascular: RRR, No MGR No LE edema  Respiratory: normal effort BS clear to ascultation bilaterally. No wheeze or rhonchi  Abdomen: flat soft +BS. Mild tenderness to palpation diffusely.   Musculoskeletal: no clubbing or cyanosis   Data Reviewed: Basic Metabolic Panel:  Recent Labs Lab 01/17/13 0106 01/18/13 0507 01/19/13 0442  NA 141 140 136*  K 3.3* 4.4 4.1  CL 94* 100 100  CO2 33* 29 25  GLUCOSE 134* 78 97  BUN 15 26* 12  CREATININE 0.73 1.00 0.72  CALCIUM 9.8 8.8 8.8   Liver Function Tests:  Recent Labs Lab 01/17/13 0106 01/18/13 0507  AST 21 17  ALT 21 17  ALKPHOS 131* 111  BILITOT 0.3 0.3  PROT 9.1* 7.5  ALBUMIN 3.7 2.9*    Recent Labs Lab 01/17/13 0106 01/17/13 0701 01/19/13 0442  LIPASE 156* 103* 161*   No results found for this basename: AMMONIA,  in the last 168 hours CBC:  Recent Labs Lab 01/17/13 0106 01/17/13 0701 01/17/13 1015 01/17/13 1609 01/17/13 2144 01/18/13 0507 01/19/13 0442  WBC 14.4* 16.6*  --   --   --  14.8* 11.5*  NEUTROABS 10.8*  --   --    --   --   --   --   HGB 16.8 15.9 15.6 14.9 13.7 12.9* 12.2*  HCT 46.9 46.7 45.3 43.5 41.2 38.4* 36.6*  MCV 91.6 92.5  --   --   --  93.9 93.1  PLT 297 297  --   --   --  269 236   Cardiac Enzymes: No results found for this basename: CKTOTAL, CKMB, CKMBINDEX, TROPONINI,  in the last 168 hours BNP (last 3 results) No results found for this basename: PROBNP,  in the last 8760 hours CBG: No results found for this basename: GLUCAP,  in the last 168 hours  Recent Results (from the past 240 hour(s))  MRSA PCR SCREENING     Status: None   Collection Time    01/17/13  6:37 AM      Result Value Range Status   MRSA by PCR NEGATIVE  NEGATIVE Final   Comment:            The GeneXpert MRSA Assay (FDA     approved for NASAL specimens     only), is one component of a     comprehensive MRSA colonization     surveillance program. It is not     intended to diagnose MRSA     infection nor to guide or     monitor treatment for     MRSA infections.     Studies: Dg Chest Port 1 View  01/18/2013   CLINICAL DATA:  Weakness, leukocytosis, abdominal pain, history chronic pancreatitis, hypertension, coronary disease, smoking, GERD  EXAM: PORTABLE CHEST - 1 VIEW  COMPARISON:  Portable exam 1739 hr compared to 01/17/2013  FINDINGS: Left subclavian Port-A-Cath stable with tip projecting over SVC.  Normal heart size, mediastinal contours, and pulmonary vascularity.  Lungs clear.  No pleural effusion or pneumothorax.  Bones unremarkable.  IMPRESSION: No acute abnormalities.   Electronically Signed   By: Lavonia Dana M.D.   On: 01/18/2013 18:09    Scheduled Meds: . amitriptyline  25 mg Oral QHS  . feeding supplement (JEVITY 1.2 CAL)  1,000 mL Per Tube Q24H  . folic acid  1 mg Oral Daily  . gabapentin  300 mg Oral BID  . multivitamin with minerals  1 tablet Oral Daily  . nicotine  14 mg Transdermal Daily  . pantoprazole (PROTONIX) IV  40 mg Intravenous Q12H  . phenytoin (DILANTIN) IV  100 mg Intravenous  Q8H  . sodium chloride  3 mL Intravenous Q12H  . thiamine  100 mg Oral Daily   Or  . thiamine  100 mg Intravenous Daily  . tiotropium  18 mcg Inhalation Daily  . traZODone  100 mg Oral QHS   Continuous Infusions:   Principal Problem:   Upper GI bleed Active Problems:   Hypertension   GERD (gastroesophageal reflux disease)   Pain around PEG tube site   Leukocytosis  Hematemesis   Pancreatitis, acute   Seizure disorder   Protein-calorie malnutrition, severe   Chronic pancreatitis   Acute blood loss anemia   Splenic vein thrombosis    Time spent: 35 minutes    [facility] Pager [phone]. If 7PM-7AM, please contact night-coverage at [email or web address], password [facility] 01/19/2013, 10:22 AM  LOS: 2 days

## 2013-01-19 NOTE — Progress Notes (Signed)
MEDICATION RELATED CONSULT NOTE - INITIAL   Pharmacy Consult for Dilantin IV >> PO Indication: h/o seizures (chronic RX PTA)  Allergies  Allergen Reactions  . Cheese Hives   Patient Measurements: Height: 5\' 6"  (167.6 cm) Weight: 111 lb 15.9 oz (50.8 kg) IBW/kg (Calculated) : 63.8  Vital Signs: Temp: 98.4 F (36.9 C) (01/08 1200) Temp src: Oral (01/08 1200) BP: 114/69 mmHg (01/08 1200) Pulse Rate: 66 (01/08 1200) Intake/Output from previous day: 01/07 0701 - 01/08 0700 In: 804.4 [I.V.:700; NG/GT:104.4] Out: 950 [Urine:950] Intake/Output from this shift: Total I/O In: 240 [P.O.:240] Out: 1600 [Urine:1600]  Labs:  Recent Labs  01/17/13 0106 01/17/13 0701  01/17/13 2144 01/18/13 0507 01/19/13 0442  WBC 14.4* 16.6*  --   --  14.8* 11.5*  HGB 16.8 15.9  < > 13.7 12.9* 12.2*  HCT 46.9 46.7  < > 41.2 38.4* 36.6*  PLT 297 297  --   --  269 236  CREATININE 0.73  --   --   --  1.00 0.72  ALBUMIN 3.7  --   --   --  2.9*  --   PROT 9.1*  --   --   --  7.5  --   AST 21  --   --   --  17  --   ALT 21  --   --   --  17  --   ALKPHOS 131*  --   --   --  111  --   BILITOT 0.3  --   --   --  0.3  --   < > = values in this interval not displayed. Estimated Creatinine Clearance: 77.6 ml/min (by C-G formula based on Cr of 0.72).  Results for DEANE, WATTENBARGER (MRN [id number]) as of 01/19/2013 14:24  Ref. Range 01/17/2013 07:01 01/18/2013 09:52  Phenytoin Lvl Latest Range: 10.0-20.0 ug/mL <2.5 (L) 11.6   Microbiology: Recent Results (from the past 720 hour(s))  MRSA PCR SCREENING     Status: None   Collection Time    01/17/13  6:37 AM      Result Value Range Status   MRSA by PCR NEGATIVE  NEGATIVE Final   Comment:            The GeneXpert MRSA Assay (FDA     approved for NASAL specimens     only), is one component of a     comprehensive MRSA colonization     surveillance program. It is not     intended to diagnose MRSA     infection nor to guide or     monitor treatment for   MRSA infections.   Medical History: Past Medical History  Diagnosis Date  . Pancreatitis chronic 08/04/2010  . GERD (gastroesophageal reflux disease)   . Insomnia 08/04/2010  . Seizure 08/04/2010  . Chronic pain syndrome 08/04/2010  . Unspecified essential hypertension 08/04/2010  . Chronic pancreatitis 08/04/2010  . Anxiety states 08/04/2010  . Chronic airway obstruction, not elsewhere classified 08/04/2010  . Tobacco use disorder 08/04/2010  . Other abnormal glucose 08/04/2010  . Coronary artery disease   . Splenic vein thrombosis     Chronic.  Marland Kitchen Hiatal hernia     EGD 01/18/13   Medications:  Scheduled:  . amitriptyline  25 mg Oral QHS  . folic acid  1 mg Oral Daily  . gabapentin  300 mg Oral BID  . multivitamin with minerals  1 tablet Oral Daily  . nicotine  14  mg Transdermal Daily  . pantoprazole (PROTONIX) IV  40 mg Intravenous Q12H  . phenytoin  300 mg Oral QHS  . sodium chloride  3 mL Intravenous Q12H  . thiamine  100 mg Oral Daily   Or  . thiamine  100 mg Intravenous Daily  . tiotropium  18 mcg Inhalation Daily  . traZODone  100 mg Oral QHS    Assessment: 53yo male on chronic Dilantin Rx PTA for h/o seizures.  Dilantin level was low on admission and pt reports having missed some doses.  While hospitalized pt has been on Dilantin 100mg  IV q8h.  Dilantin level checked today = 11.6, level was drawn ~6 hours after a 100mg  IV dose.  Pt's dose PTA was 300mg  po at bedtime.  Albumin = 2.9 today.  Corrected Dilantin level = 17.1. No seizure activity reported.    Goal of Therapy:  Dilantin Level 10-20  Plan:  Dilantin 300mg  PO daily at bedtime. Recheck Dilantin level in 5-7 days.  Hart Robinsons A 01/19/2013,2:24 PM

## 2013-01-19 NOTE — Progress Notes (Addendum)
RN spoke to IR at [facility].  Pt scheduled for PEG/PEJ tube change today.  RN called [facility] IR to evaluate when tube feeding needed to be stopped prior. RN spoke to [place].  Patient also has had clear liquid this morning.  RN spoke to [place] in [employer].  Informed that patient stated had tube feedings resumed via PEJ per Dr. [name] last night.  IR stated that they thought the tube was clogged.  RN informed that tube had been in for a while and was discolored.  Rip Harbour stated that she would speak with the IR and call me back.   Rip Harbour returned call and stated that she had spoken with Jacqulynn Cadet, IR at [facility].  Stated that he had concerns about changing the tube since it was working and tube feedings infusing.  Stated that his recommendation was for the patient to have this done as outpatient. Rip Harbour provided with phone and fax for IR department so that patient could have outpatient provided schedule appointment when necessary, since the tube was working.  RN called Dr. [name] and informed him of the procedure.  Stated that it was fine.  Stated that he had concerns about the patient following up with his GI for outpatient follow-up but if that was IR's recommendation, it was fine with him.  Dr. [name] gave order to advance patient to Full Liquid diet and advance to [facility] Diet as tolerated. Also gave order to discontinue feeding during day and return to Nocturnal feeding of Jevity at 50/hr from 8 pm to 8 am.  RN notified Rip Harbour at [facility] IR.  Dyanne Carrel, NP notified.  Patient to have outpatient GI doctor to follow-up in regard to changing the PEJ tube. Patient educated and aware.

## 2013-01-19 NOTE — Progress Notes (Signed)
Patient seen, independently examined and chart reviewed. I agree with exam, assessment and plan discussed with Dyanne Carrel, NP.  53 year old man presented with hematemesis and upper abdominal pain. He was admitted for further evaluation of this. He is suspected to have acute on chronic pancreatitis. He underwent an EGD which was unremarkable.  Afebrile, vital signs stable. Basic metabolic panel unremarkable. Hemoglobin stable. Leukocytosis trending downwards. Lipase 161. Overall he feels okay but has some abdominal pain. No vomiting. No bleeding. Abdomen soft and nondistended. Minimal generalized tenderness. Lungs are clear to auscultation bilaterally. Cardiovascular regular rate and rhythm. Appears nontoxic.  Hematemesis with history of gastritis and esophagitis. Acute on chronic pancreatitis Alcohol abuse, history of Severe malnutrition in context of chronic disease  Overall he sees no improvement. There is no bleeding. Likely home 1/9 if tolerating feeds.  Marcus Hodgkins, MD [facility] [phone] ,

## 2013-01-20 LAB — BASIC METABOLIC PANEL
BUN: 7 mg/dL (ref 6–23)
CALCIUM: 8.7 mg/dL (ref 8.4–10.5)
CO2: 26 mEq/L (ref 19–32)
CREATININE: 0.62 mg/dL (ref 0.50–1.35)
Chloride: 102 mEq/L (ref 96–112)
GFR calc non Af Amer: >90 mL/min (ref >90)
Glucose, Bld: 163 mg/dL — ABNORMAL HIGH (ref 70–99)
Potassium: 4.1 mEq/L (ref 3.7–5.3)
Sodium: 138 mEq/L (ref 137–147)

## 2013-01-20 MED ORDER — JEVITY 1.2 CAL PO LIQD
1000.0000 mL | ORAL | Status: DC
  Administered 2013-01-20: 1000 mL
  Filled 2013-01-20 (×4): qty 1000

## 2013-01-20 MED ORDER — PANTOPRAZOLE SODIUM 40 MG PO TBEC
40.0000 mg | DELAYED_RELEASE_TABLET | Freq: Two times a day (BID) | ORAL | Status: DC
  Administered 2013-01-20 – 2013-01-21 (×2): 40 mg via ORAL
  Filled 2013-01-20 (×2): qty 1

## 2013-01-20 MED ORDER — HYDROCODONE-ACETAMINOPHEN 10-325 MG PO TABS: 1.0000 | ORAL_TABLET | ORAL | Status: DC | PRN

## 2013-01-20 MED ORDER — HYDROMORPHONE HCL PF 1 MG/ML IJ SOLN
1.0000 mg | Freq: Four times a day (QID) | INTRAMUSCULAR | Status: DC | PRN
  Administered 2013-01-20 – 2013-01-21 (×3): 1 mg via INTRAVENOUS
  Filled 2013-01-20 (×3): qty 1

## 2013-01-20 MED ORDER — DIAZEPAM 5 MG PO TABS: 5.0000 mg | ORAL_TABLET | Freq: Three times a day (TID) | ORAL | Status: DC | PRN

## 2013-01-20 NOTE — Progress Notes (Signed)
[facility] PROGRESS NOTE  Marcus Brooks ZOX:[id number] DOB: 05-06-1960 DOA: 01/17/2013 PCP: [name], MD  Assessment/Plan: #1 hematemesis: No further episodes. Per GI notes he had EGD in January of 2013, which did not show any concerning findings. However, in another GI note, apparently, he had EGD in August of 2014, which showed gastritis and esophagitis. S/P EGD 01/18/13. Results yield small sliding hiatal hernia. Prominent gastric folds but no evidence of Mallory-Weiss tear gastric varices or peptic ulcer disease.  PEG/PEJ in place. Both tubes noted to have debris externally and internally per dr. [name]. He was scheduled to go to [facility] for tube replacement but procedure cancelled per IR. Jejunal feedings rate decreased last night due to complaints nausea. No vomiting. Tolerating full liquid. Continue PPI. Hemoglobin stable.    #2 acute and chronic pancreatitis: continues with some pain.  Continue symptomatic treatment. Tolerating full liquids. 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 Appreciate GI assistance. #3 PEG/J Tube: See above. Procedure to replace tube cancelled. Tube feedings resumed per GI.   #4 history of seizure disorder: No seizure activity. He is on Dilantin. He states that due to the nausea and vomiting he was not able to take his medications. Dilantin level <2.5 01/17/13. Loading dose of dilantin given and dilantin daily resumed.   #5 history of hypertension: SBP range 116-142. Home medications include norvasc which is on hold for now. Continue to monitor blood pressure closely and resume when indicated.   #6 Leukocytosis:  trend downward. Chest clear on admission per xray. PEG tube itself looks dark but site is without erythema or swelling. Likely related to #2. He is afebrile and non-toxic appearing. Will recheck in am #7. Hyponatremia: mild. Resolved.     Code Status: full Family Communication: none present Disposition Plan: home hopefully tomorrow   Consultants:  GI  Procedures:  EGD 01/18/13  Antibiotics:  none  HPI/Subjective: Sitting on side of bed. Reports not feeling well. No vomiting.   Objective: Filed Vitals:   01/20/13 1412  BP: 116/53  Pulse: 73  Temp: 98.1 F (36.7 C)  Resp: 18    Intake/Output Summary (Last 24 hours) at 01/20/13 1517 Last data filed at 01/20/13 1312  Gross per 24 hour  Intake 646.67 ml  Output   2000 ml  Net -1353.33 ml   Filed Weights   01/16/13 2243 01/17/13 0555  Weight: 45.36 kg (100 lb) 50.8 kg (111 lb 15.9 oz)    Exam:   General:  Thin frail somewhat chronically ill appearing  Cardiovascular: RRR No MGR No LE edema  Respiratory: normal effort BS clear bilaterally no wheeze no rhonchi  Abdomen: flat soft PEG tube intact. Insertion site without erythema or drainage.   Musculoskeletal: no clubbing or cyanosis   Data Reviewed: Basic Metabolic Panel:  Recent Labs Lab 01/17/13 0106 01/18/13 0507 01/19/13 0442 01/20/13 0444  NA 141 140 136* 138  K 3.3* 4.4 4.1 4.1  CL 94* 100 100 102  CO2 33* 29 25 26   GLUCOSE 134* 78 97 163*  BUN 15 26* 12 7  CREATININE 0.73 1.00 0.72 0.62  CALCIUM 9.8 8.8 8.8 8.7   Liver Function Tests:  Recent Labs Lab 01/17/13 0106 01/18/13 0507  AST 21 17  ALT 21 17  ALKPHOS 131* 111  BILITOT 0.3 0.3  PROT 9.1* 7.5  ALBUMIN 3.7 2.9*    Recent Labs  Lab 01/17/13 0106 01/17/13 0701 01/19/13 0442  LIPASE 156* 103* 161*   No results found for this basename: AMMONIA,  in the last 168 hours CBC:  Recent Labs Lab 01/17/13 0106 01/17/13 0701 01/17/13 1015 01/17/13 1609 01/17/13 2144 01/18/13 0507 01/19/13 0442  WBC 14.4* 16.6*  --   --   --  14.8* 11.5*  NEUTROABS 10.8*  --   --   --   --   --   --   HGB 16.8 15.9 15.6 14.9 13.7 12.9* 12.2*  HCT 46.9 46.7 45.3 43.5 41.2 38.4* 36.6*  MCV 91.6 92.5  --   --   --   93.9 93.1  PLT 297 297  --   --   --  269 236   Cardiac Enzymes: No results found for this basename: CKTOTAL, CKMB, CKMBINDEX, TROPONINI,  in the last 168 hours BNP (last 3 results) No results found for this basename: PROBNP,  in the last 8760 hours CBG: No results found for this basename: GLUCAP,  in the last 168 hours  Recent Results (from the past 240 hour(s))  MRSA PCR SCREENING     Status: None   Collection Time    01/17/13  6:37 AM      Result Value Range Status   MRSA by PCR NEGATIVE  NEGATIVE Final   Comment:            The GeneXpert MRSA Assay (FDA     approved for NASAL specimens     only), is one component of a     comprehensive MRSA colonization     surveillance program. It is not     intended to diagnose MRSA     infection nor to guide or     monitor treatment for     MRSA infections.     Studies: Dg Chest Port 1 View  01/18/2013   CLINICAL DATA:  Weakness, leukocytosis, abdominal pain, history chronic pancreatitis, hypertension, coronary disease, smoking, GERD  EXAM: PORTABLE CHEST - 1 VIEW  COMPARISON:  Portable exam 1739 hr compared to 01/17/2013  FINDINGS: Left subclavian Port-A-Cath stable with tip projecting over SVC.  Normal heart size, mediastinal contours, and pulmonary vascularity.  Lungs clear.  No pleural effusion or pneumothorax.  Bones unremarkable.  IMPRESSION: No acute abnormalities.   Electronically Signed   By: Lavonia Dana M.D.   On: 01/18/2013 18:09    Scheduled Meds: . amitriptyline  25 mg Oral QHS  . folic acid  1 mg Oral Daily  . gabapentin  300 mg Oral BID  . multivitamin with minerals  1 tablet Oral Daily  . nicotine  14 mg Transdermal Daily  . pantoprazole  40 mg Oral BID AC  . phenytoin  300 mg Oral QHS  . sodium chloride  3 mL Intravenous Q12H  . thiamine  100 mg Oral Daily   Or  . thiamine  100 mg Intravenous Daily  . tiotropium  18 mcg Inhalation Daily  . traZODone  100 mg Oral QHS   Continuous Infusions: . feeding supplement  (JEVITY 1.2 CAL) 1,000 mL (01/19/13 2103)    Principal Problem:   Upper GI bleed Active Problems:   Hypertension   GERD (gastroesophageal reflux disease)   Pain around PEG tube site   Leukocytosis   Hematemesis   Pancreatitis, acute   Seizure disorder   Protein-calorie malnutrition, severe   Chronic pancreatitis   Acute blood loss anemia   Splenic vein thrombosis   Hyponatremia  Hiatal hernia    Time spent: 30 minutes    [facility]  If 7PM-7AM, please contact night-coverage at [email or web address], password [facility] 01/20/2013, 3:17 PM  LOS: 3 days

## 2013-01-20 NOTE — Progress Notes (Signed)
Subjective; Patient continues to complain of epigastric pain. He believes most of his pain is secondary to gastrostomy tube. He denies nausea and vomiting. He has not happy that tube was not changed. He does not want to be discharged to the facility that he came from. He states this facility is not well heated and with abundant cockroaches. Objective; BP 146/79  Pulse 67  Temp(Src) 97.9 F (36.6 C) (Oral)  Resp 18  Ht 5\' 6"  (1.676 m)  Wt 111 lb 15.9 oz (50.8 kg)  BMI 18.08 kg/m2  SpO2 96% Patient is sitting on the edge of bed going to his primary belongings. He is in his private clothes.  Lab data; WBC 11.5, H&H 12.2 and 36.6 and platelet count 236K. Serum lipase yesterday was 161; it was 1033 days ago.  Assessment; #1. 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 He does not appear to be acutely ill. I believe most of his pain is secondary to pancreatitis and not due to gastric and jejunal tube. Presently he is receiving nocturnal jejunal feeding and is on full liquids. #2. Upper GI bleed. H&H is below normal but stable and no evidence of recurrent bleed. GI bleed felt to be secondary to gastritis. He could also have small Mallory-Weiss tear healed by the time of EGD. #3. PEG/PEJ issues. Patient was scheduled for this tube to be changed yesterday but it was canceled by interventional radiologist who recommended that he should be done on an outpatient basis. This tube is for nodes and may clog any time. If this occurs he will need to go back to: Radiology. Patient is not interested in having outpatient procedure since he has no transportation.  Recommendations; Continue full liquids for now. Increase Jevity rate to 65 mL per hour(8 pm to 8 AM daily). Change pantoprazole to oral route.

## 2013-01-20 NOTE — Progress Notes (Signed)
Patient seen, independently examined and chart reviewed. I agree with exam, assessment and plan discussed with Dyanne Carrel, NP.  He feels about the same today. Chronic abdominal pain is about the same. One episode of vomiting earlier this morning.  Afebrile, vital signs stable. Appears calm and comfortable. Speech fluent and clear. Cardiovascular regular rate and rhythm. No murmur, rub or gallop. Respiratory clear to auscultation bilaterally. No wheezes, rales or rhonchi. Normal respiratory effort. Abdomen soft, nontender, nondistended. PEG tube appears unremarkable.  Basic metabolic panel unremarkable.  Hematemesis with history of gastritis and esophagitis. Acute on chronic pancreatitis Alcohol abuse, history of Severe malnutrition in context of chronic disease  He has had no recurrent episodes of hematemesis and hemoglobin remained stable. I think at this point his abdominal pain is mostly chronic. Discussed with Dr. [name] who is recommended continued observation with repeat lab work in the morning and if stable then discharge home. Also recommended increasing the tube rate.  Summary 53 year old man presented with hematemesis and upper abdominal pain. He was admitted for further evaluation of this. He is suspected to have acute on chronic pancreatitis. He underwent an EGD which was unremarkable.  Murray Hodgkins, MD [facility] [phone]

## 2013-01-20 NOTE — Progress Notes (Signed)
Decreased Jevity to 30 ml hr at patient request. States it was making him nauseated. Vomited x 1.

## 2013-01-21 DIAGNOSIS — K861 Other chronic pancreatitis: Secondary | ICD-10-CM

## 2013-01-21 DIAGNOSIS — E43 Unspecified severe protein-calorie malnutrition: Secondary | ICD-10-CM

## 2013-01-21 DIAGNOSIS — K922 Gastrointestinal hemorrhage, unspecified: Secondary | ICD-10-CM

## 2013-01-21 DIAGNOSIS — K859 Acute pancreatitis without necrosis or infection, unspecified: Secondary | ICD-10-CM

## 2013-01-21 LAB — COMPREHENSIVE METABOLIC PANEL
ALT: 18 U/L (ref 0–53)
AST: 20 U/L (ref 0–37)
Albumin: 2.6 g/dL — ABNORMAL LOW (ref 3.5–5.2)
Alkaline Phosphatase: 110 U/L (ref 39–117)
BILIRUBIN TOTAL: 0.2 mg/dL — AB (ref 0.3–1.2)
BUN: 9 mg/dL (ref 6–23)
CALCIUM: 8.7 mg/dL (ref 8.4–10.5)
CO2: 26 meq/L (ref 19–32)
CREATININE: 0.59 mg/dL (ref 0.50–1.35)
Chloride: 103 mEq/L (ref 96–112)
GFR calc Af Amer: >90 mL/min (ref >90)
GFR calc non Af Amer: >90 mL/min (ref >90)
Glucose, Bld: 131 mg/dL — ABNORMAL HIGH (ref 70–99)
Potassium: 4.3 mEq/L (ref 3.7–5.3)
Sodium: 139 mEq/L (ref 137–147)
Total Protein: 7.1 g/dL (ref 6.0–8.3)

## 2013-01-21 LAB — LIPASE, BLOOD: LIPASE: 142 U/L — AB (ref 11–59)

## 2013-01-21 MED ORDER — HEPARIN SOD (PORK) LOCK FLUSH 100 UNIT/ML IV SOLN
500.0000 [IU] | Freq: Once | INTRAVENOUS | Status: AC
  Administered 2013-01-21: 500 [IU] via INTRAVENOUS
  Filled 2013-01-21: qty 5

## 2013-01-21 NOTE — Progress Notes (Signed)
Case discussed with Dr. Sarajane Jews and Dr. [name] earlier today. Patient reports he is ready to go home. Abdominal pain back to baseline. He ate fried chicken yesterday without any problem. Patient does report having pancreatic enzymes at home which he uses when he eats during the day. No further apparent bleeding. No H&H today    Vital signs in last 24 hours: Temp:  [97.3 F (36.3 C)-98.3 F (36.8 C)] 98.3 F (36.8 C) (01/10 5681) Pulse Rate:  [58-73] 58 (01/10 0633) Resp:  [18-20] 18 (01/10 2751) BP: (109-130)/(53-82) 109/66 mmHg (01/10 0633) SpO2:  [96 %-97 %] 97 % (01/10 0830) Last BM Date: 01/16/13 General:   Chronically ill, somewhat disheveled individual. Appears to be in no acute distress. Abdomen:  Nondistended. PEG/PEJ tube appears weathered bu otherwise intact. Patient has minimal diffuse abdominal tenderness. No rebound. No mass. Extremities:  Without clubbing or edema.    Intake/Output from previous day: 01/09 0701 - 01/10 0700 In: 240 [P.O.:240] Out: 2700 [Urine:2700] Intake/Output this shift: Total I/O In: -  Out: 400 [Urine:400]  Lab Results:  Recent Labs  01/19/13 0442  WBC 11.5*  HGB 12.2*  HCT 36.6*  PLT 236   BMET  Recent Labs  01/19/13 0442 01/20/13 0444 01/21/13 0657  NA 136* 138 139  K 4.1 4.1 4.3  CL 100 102 103  CO2 25 26 26   GLUCOSE 97 163* 131*  BUN 12 7 9   CREATININE 0.72 0.62 0.59  CALCIUM 8.8 8.7 8.7   LFT  Recent Labs  01/21/13 0657  PROT 7.1  ALBUMIN 2.6*  AST 20  ALT 18  ALKPHOS 110  BILITOT 0.2*     Impression:   Acute on chronic pancreatitis improved. GI bleed-unimpressive EGD. Probably ready for discharge today as discussed with Dr. Sarajane Jews  Recommendations:  Continue PPI. Continue pancreatic enzymes when taking nutrition per orally. Continue nocturnal feedings.  Followup with Dr. [name] as planned.

## 2013-01-21 NOTE — Discharge Summary (Signed)
Physician Discharge Summary  Marcus Brooks OAC:[id number] DOB: July 20, 1960 DOA: 01/17/2013  PCP: [name], MD  Admit date: 01/17/2013 Discharge date: 01/21/2013  Recommendations for Outpatient Follow-up:  1. Patient would benefit from PEGJ tube exchange via interventional radiology as an outpatient, defer to PCP, see below. 2. Hematemesis secondary to gastritis. 3. Acute on chronic pancreatitis. 4. Continue to encourage abstinence from alcohol and cessation of smoking. 5. Home health RN, social work.   Follow-up Information   Follow up with [facility].   Contact information:   [place] [address] [phone]      Follow up with [name], MD. Schedule an appointment as soon as possible for a visit in 1 week.   Specialty:  Family Medicine   Contact information:   [facility] [address] [place] [address] [phone]      Discharge Diagnoses:  1. Hematemesis secondary to gastritis 2. Acute on chronic pancreatitis 3. History of alcohol abuse 4. Severe malnutrition in context of chronic illness  Discharge Condition: Improved Disposition: Home with [facility] RN and social work  Diet recommendation: Low-fat, continue tube feeds  Filed Weights   01/16/13 2243 01/17/13 0555  Weight: 45.36 kg (100 lb) 50.8 kg (111 lb 15.9 oz)    History of present illness:  53 year old man presented with hematemesis and upper abdominal pain. He was admitted for further evaluation of this. He was suspected to have acute on chronic pancreatitis.  Hospital Course:  Marcus Brooks was seen in consultation with gastroenterology and underwent EGD which was unremarkable. He had no further hematemesis on admission and hemoglobin remained stable. He did not require any blood products. Hematemesis was thought secondary to gastritis. He continues on PPI therapy. Acute on chronic pancreatitis resolved, possibly related to alcohol use. The day prior to admission patient  ate fried chicken although he was on a liquid diet. He tolerated this well. His PEG tube functions but it is recommended it be replaced because of residue on inside. This was scheduled as an inpatient however interventional radiology declined to do this as an outpatient and recommended outpatient exchange. Individual issues as below.  1. Hematemesis with history of gastritis and esophagitis. EGD unremarkable. No recurrent bleeding, hemoglobin stable. Continue PPI. 2. Acute on chronic pancreatitis. Acute component clinically resolved, lipase improved. Tolerating food. Possibly secondary to alcohol use.  3. Alcohol abuse, history of. No evidence of withdrawal.  4. Severe malnutrition in context of chronic disease. Tolerating tube feeds.  Consultants:  GI Procedures:  1/7 EGD: Small sliding hiatal hernia. Prominent gastric folds but no evidence of Mallory-Weiss tear gastric varices or peptic ulcer disease. PEG/PEJ in place. Both tubes noted to have debris externally and internally.  Discharge Instructions  Discharge Orders   Future Orders Complete By Expires   Activity as tolerated - No restrictions  As directed    Discharge instructions  As directed    Comments:     Call physician or seek immediate medical attention for bleeding, worsening abdominal pain, vomiting or worsening of condition. Low-fat diet. Avoid all alcohol as this will cause pancreatitis. Please stop smoking. Continue your tube feeds at night. Continue pancreatic enzymes. Be sure to followup with your primary care physician doctor in 1 week.       Medication List         albuterol 108 (90 BASE) MCG/ACT inhaler  Commonly known as:  PROVENTIL HFA;VENTOLIN HFA  Inhale 2 puffs into the lungs every 6 (  six) hours as needed for wheezing or shortness of breath.     amitriptyline 25 MG tablet  Commonly known as:  ELAVIL  Take 25 mg by mouth at bedtime.     amLODipine 10 MG tablet  Commonly known as:  NORVASC  Take 10 mg by  mouth daily.     cholecalciferol 1000 UNITS tablet  Commonly known as:  VITAMIN D  Take 1,000 Units by mouth daily.     diazepam 10 MG tablet  Commonly known as:  VALIUM  Take 10 mg by mouth 3 (three) times daily as needed for anxiety.     gabapentin 300 MG capsule  Commonly known as:  NEURONTIN  Take 1,200 mg by mouth 2 (two) times daily.     HYDROcodone-acetaminophen 10-325 MG per tablet  Commonly known as:  NORCO  Take 1 tablet by mouth every 6 (six) hours as needed for pain (hold if lethargic).     loperamide 2 MG capsule  Commonly known as:  IMODIUM  Take 8 mg by mouth daily.     loratadine 10 MG tablet  Commonly known as:  CLARITIN  Take 10 mg by mouth daily.     omeprazole 40 MG capsule  Commonly known as:  PRILOSEC  Take 40 mg by mouth 2 (two) times daily.     phenytoin 100 MG ER capsule  Commonly known as:  DILANTIN  Take 300 mg by mouth every evening.     potassium chloride 10 MEQ CR tablet  Commonly known as:  KLOR-CON  Take 10 mEq by mouth daily.     tiotropium 18 MCG inhalation capsule  Commonly known as:  SPIRIVA  Place 18 mcg into inhaler and inhale daily.     traZODone 100 MG tablet  Commonly known as:  DESYREL  Take 100 mg by mouth 3 (three) times daily.     vitamin B-12 1000 MCG tablet  Commonly known as:  CYANOCOBALAMIN  Take 1,000 mcg by mouth daily.       Allergies  Allergen Reactions  . Cheese Hives    The results of significant diagnostics from this hospitalization (including imaging, microbiology, ancillary and laboratory) are listed below for reference.    Significant Diagnostic Studies: Dg Chest Port 1 View  01/18/2013   CLINICAL DATA:  Weakness, leukocytosis, abdominal pain, history chronic pancreatitis, hypertension, coronary disease, smoking, GERD  EXAM: PORTABLE CHEST - 1 VIEW  COMPARISON:  Portable exam 1739 hr compared to 01/17/2013  FINDINGS: Left subclavian Port-A-Cath stable with tip projecting over SVC.  Normal heart  size, mediastinal contours, and pulmonary vascularity.  Lungs clear.  No pleural effusion or pneumothorax.  Bones unremarkable.  IMPRESSION: No acute abnormalities.   Electronically Signed   By: Lavonia Dana M.D.   On: 01/18/2013 18:09   Dg Abd Acute W/chest  01/17/2013   CLINICAL DATA:  Hematemesis.  EXAM: ACUTE ABDOMEN SERIES (ABDOMEN 2 VIEW & CHEST 1 VIEW)  COMPARISON:  Abdominal CT 01/09/2013  FINDINGS: Left-sided porta catheter in good position. No cardiomegaly. Clear lungs. No effusion or pneumothorax. Unremarkable appearance of gastrojejunostomy tube. Splenic calcifications consistent with granulomatous disease. No evidence of bowel obstruction or perforation.  IMPRESSION: 1. Nonobstructive bowel gas pattern. 2. Gastrojejunostomy tube in good position. 3. Clear chest.   Electronically Signed   By: Jorje Guild M.D.   On: 01/17/2013 02:24    Microbiology: Recent Results (from the past 240 hour(s))  MRSA PCR SCREENING     Status: None  Collection Time    01/17/13  6:37 AM      Result Value Range Status   MRSA by PCR NEGATIVE  NEGATIVE Final   Comment:            The GeneXpert MRSA Assay (FDA     approved for NASAL specimens     only), is one component of a     comprehensive MRSA colonization     surveillance program. It is not     intended to diagnose MRSA     infection nor to guide or     monitor treatment for     MRSA infections.     Labs: Basic Metabolic Panel:  Recent Labs Lab 01/17/13 0106 01/18/13 0507 01/19/13 0442 01/20/13 0444 01/21/13 0657  NA 141 140 136* 138 139  K 3.3* 4.4 4.1 4.1 4.3  CL 94* 100 100 102 103  CO2 33* 29 25 26 26   GLUCOSE 134* 78 97 163* 131*  BUN 15 26* 12 7 9   CREATININE 0.73 1.00 0.72 0.62 0.59  CALCIUM 9.8 8.8 8.8 8.7 8.7   Liver Function Tests:  Recent Labs Lab 01/17/13 0106 01/18/13 0507 01/21/13 0657  AST 21 17 20   ALT 21 17 18   ALKPHOS 131* 111 110  BILITOT 0.3 0.3 0.2*  PROT 9.1* 7.5 7.1  ALBUMIN 3.7 2.9* 2.6*     Recent Labs Lab 01/17/13 0106 01/17/13 0701 01/19/13 0442 01/21/13 0657  LIPASE 156* 103* 161* 142*   CBC:  Recent Labs Lab 01/17/13 0106 01/17/13 0701 01/17/13 1015 01/17/13 1609 01/17/13 2144 01/18/13 0507 01/19/13 0442  WBC 14.4* 16.6*  --   --   --  14.8* 11.5*  NEUTROABS 10.8*  --   --   --   --   --   --   HGB 16.8 15.9 15.6 14.9 13.7 12.9* 12.2*  HCT 46.9 46.7 45.3 43.5 41.2 38.4* 36.6*  MCV 91.6 92.5  --   --   --  93.9 93.1  PLT 297 297  --   --   --  269 236   Principal Problem:   Upper GI bleed Active Problems:   Hypertension   GERD (gastroesophageal reflux disease)   Pain around PEG tube site   Leukocytosis   Hematemesis   Pancreatitis, acute   Seizure disorder   Protein-calorie malnutrition, severe   Chronic pancreatitis   Acute blood loss anemia   Splenic vein thrombosis   Hyponatremia   Hiatal hernia   Time coordinating discharge: 25 minutes  Signed:  Murray Hodgkins, MD [facility] 01/21/2013, 10:57 AM

## 2013-01-21 NOTE — Progress Notes (Signed)
I was informed by the visitor in 307 while rounding on the patient in 307 that he (the visitor)had given food to patient in 306.    I asked the visitor in 97 why he had given the patient food he stated while he was walking into 307 the patient motioned him in the room and asked for some of his food.  I thanked the visitor for being willing to share food but informed him not to do so without checking with staff in the future.   Upon entering patient's room he was found sitting in bed eating solid foods including fried chicken given to him by the visitor in the room next door 307.  When asked where he had gotten the food he would not tell me.  The patient became angry when I informed him he was only to have a full liquid diet as ordered by his doctor.  He continued to eat the plate of food stuffing his mouth full then threw the small remainder of food into the trash can.  Pt informed of the importance of following doctor's orders.  Nursing staff to continue to monitor.

## 2013-01-21 NOTE — Progress Notes (Signed)
Pt a/o.vss. Up ad lib. Discharge instructions given. Pt verbalized understanding of instructions. Pt awaiting for family to arrive for discharge.

## 2013-01-21 NOTE — Progress Notes (Signed)
[facility] PROGRESS NOTE  Marcus Brooks KKX:[id number] DOB: [birth year] DOA: 01/17/2013 PCP: [name], MD  Assessment/Plan: 1. Hematemesis with history of gastritis and esophagitis. EGD unremarkable. No recurrent bleeding, hemoglobin stable. Continue PPI. 2. Acute on chronic pancreatitis. Acute component clinically resolved, lipase improved. Tolerating food. Possibly secondary to alcohol use.  3. Alcohol abuse, history of. No evidence of withdrawal.  4. Severe malnutrition in context of chronic disease. Tolerating tube feeds.   Discharge home today with home health RN and Education officer, [employer]. Continue pancreatic enzymes at home, PPI.  Continue tube feeds.  Absolutely no alcohol.  Stop smoking.  Recommended outpatient followup for PEG tube exchange.  Murray Hodgkins, MD  [facility]  Pager [phone] If 7PM-7AM, please contact night-coverage at [email or web address], password [facility] 01/21/2013, 10:28 AM  LOS: 4 days   Summary: 53 year old man presented with hematemesis and upper abdominal pain. He was admitted for further evaluation of this. He is suspected to have acute on chronic pancreatitis. He underwent an EGD which was unremarkable.  Consultants:  GI  Procedures:  1/7 EGD: Small sliding hiatal hernia. Prominent gastric folds but no evidence of Mallory-Weiss tear gastric varices or peptic ulcer disease. PEG/PEJ in place. Both tubes noted to have debris externally and internally.  HPI/Subjective: Ate fried chicken last night per RN note. Feels better today.  Objective: Filed Vitals:   01/20/13 2155 01/21/13 0633 01/21/13 0736 01/21/13 0830  BP: 130/82 109/66    Pulse: 66 58    Temp: 97.3 F (36.3 C) 98.3 F (36.8 C)    TempSrc: Oral Oral    Resp: 20 18    Height:      Weight:      SpO2: 97% 97% 97% 97%    Intake/Output Summary (Last 24 hours) at 01/21/13 1028 Last data filed at 01/21/13 0842  Gross per 24 hour  Intake    240 ml  Output   2450 ml  Net  -2210  ml     Filed Weights   01/16/13 2243 01/17/13 0555  Weight: 45.36 kg (100 lb) 50.8 kg (111 lb 15.9 oz)    Exam:   AF, VSS  General: Appears calm and comfortable.  Cardiovascular regular rate and rhythm. No murmur, rub or gallop.  Respiratory clear to auscultation bilaterally. No wheezes, rales or rhonchi. Normal respiratory effort.  Abdomen soft, nontender, nondistended. PEG in place.  Data Reviewed:  CMP unremarkable  Lipase improved, down to 142  Scheduled Meds: . amitriptyline  25 mg Oral QHS  . folic acid  1 mg Oral Daily  . gabapentin  300 mg Oral BID  . multivitamin with minerals  1 tablet Oral Daily  . nicotine  14 mg Transdermal Daily  . pantoprazole  40 mg Oral BID AC  . phenytoin  300 mg Oral QHS  . sodium chloride  3 mL Intravenous Q12H  . thiamine  100 mg Oral Daily   Or  . thiamine  100 mg Intravenous Daily  . tiotropium  18 mcg Inhalation Daily  . traZODone  100 mg Oral QHS   Continuous Infusions: . feeding supplement (JEVITY 1.2 CAL) 1,000 mL (01/20/13 1948)    Principal Problem:   Upper GI bleed Active Problems:   Hypertension   GERD (gastroesophageal reflux disease)   Pain around PEG tube site   Leukocytosis   Hematemesis   Pancreatitis, acute   Seizure disorder   Protein-calorie malnutrition, severe   Chronic pancreatitis   Acute blood loss anemia   Splenic  vein thrombosis   Hyponatremia   Hiatal hernia

## 2013-01-23 ENCOUNTER — Inpatient Hospital Stay (HOSPITAL_COMMUNITY)
Admission: EM | Admit: 2013-01-23 | Discharge: 2013-01-27 | DRG: 438 | Disposition: A | Discharge status: Home Health | Payer: Medicaid Other | Attending: Internal Medicine | Admitting: Internal Medicine

## 2013-01-23 ENCOUNTER — Encounter (HOSPITAL_COMMUNITY): Payer: Self-pay | Admitting: Internal Medicine

## 2013-01-23 ENCOUNTER — Emergency Department (HOSPITAL_COMMUNITY): Payer: Medicaid Other

## 2013-01-23 DIAGNOSIS — J4489 Other specified chronic obstructive pulmonary disease: Secondary | ICD-10-CM | POA: Diagnosis present

## 2013-01-23 DIAGNOSIS — G40909 Epilepsy, unspecified, not intractable, without status epilepticus: Secondary | ICD-10-CM | POA: Diagnosis present

## 2013-01-23 DIAGNOSIS — I10 Essential (primary) hypertension: Secondary | ICD-10-CM | POA: Diagnosis present

## 2013-01-23 DIAGNOSIS — K2970 Gastritis, unspecified, without bleeding: Secondary | ICD-10-CM | POA: Diagnosis present

## 2013-01-23 DIAGNOSIS — Z79899 Other long term (current) drug therapy: Secondary | ICD-10-CM

## 2013-01-23 DIAGNOSIS — K219 Gastro-esophageal reflux disease without esophagitis: Secondary | ICD-10-CM | POA: Diagnosis present

## 2013-01-23 DIAGNOSIS — F17200 Nicotine dependence, unspecified, uncomplicated: Secondary | ICD-10-CM | POA: Diagnosis present

## 2013-01-23 DIAGNOSIS — Y833 Surgical operation with formation of external stoma as the cause of abnormal reaction of the patient, or of later complication, without mention of misadventure at the time of the procedure: Secondary | ICD-10-CM | POA: Diagnosis present

## 2013-01-23 DIAGNOSIS — E876 Hypokalemia: Secondary | ICD-10-CM | POA: Diagnosis present

## 2013-01-23 DIAGNOSIS — T85848S Pain due to other internal prosthetic devices, implants and grafts, sequela: Secondary | ICD-10-CM

## 2013-01-23 DIAGNOSIS — E43 Unspecified severe protein-calorie malnutrition: Secondary | ICD-10-CM | POA: Diagnosis present

## 2013-01-23 DIAGNOSIS — G894 Chronic pain syndrome: Secondary | ICD-10-CM | POA: Diagnosis present

## 2013-01-23 DIAGNOSIS — K861 Other chronic pancreatitis: Secondary | ICD-10-CM | POA: Diagnosis present

## 2013-01-23 DIAGNOSIS — J449 Chronic obstructive pulmonary disease, unspecified: Secondary | ICD-10-CM | POA: Diagnosis present

## 2013-01-23 DIAGNOSIS — K9429 Other complications of gastrostomy: Secondary | ICD-10-CM | POA: Diagnosis present

## 2013-01-23 DIAGNOSIS — T889XXS Complication of surgical and medical care, unspecified, sequela: Secondary | ICD-10-CM

## 2013-01-23 DIAGNOSIS — L03319 Cellulitis of trunk, unspecified: Secondary | ICD-10-CM

## 2013-01-23 DIAGNOSIS — Z931 Gastrostomy status: Secondary | ICD-10-CM

## 2013-01-23 DIAGNOSIS — Z9861 Coronary angioplasty status: Secondary | ICD-10-CM

## 2013-01-23 DIAGNOSIS — K859 Acute pancreatitis without necrosis or infection, unspecified: Principal | ICD-10-CM | POA: Diagnosis present

## 2013-01-23 DIAGNOSIS — Z934 Other artificial openings of gastrointestinal tract status: Secondary | ICD-10-CM

## 2013-01-23 DIAGNOSIS — Z681 Body mass index (BMI) 19 or less, adult: Secondary | ICD-10-CM

## 2013-01-23 DIAGNOSIS — I2510 Atherosclerotic heart disease of native coronary artery without angina pectoris: Secondary | ICD-10-CM | POA: Diagnosis present

## 2013-01-23 DIAGNOSIS — L02219 Cutaneous abscess of trunk, unspecified: Secondary | ICD-10-CM | POA: Diagnosis present

## 2013-01-23 DIAGNOSIS — F411 Generalized anxiety disorder: Secondary | ICD-10-CM | POA: Diagnosis present

## 2013-01-23 DIAGNOSIS — L0390 Cellulitis, unspecified: Secondary | ICD-10-CM | POA: Diagnosis present

## 2013-01-23 DIAGNOSIS — K9422 Gastrostomy infection: Secondary | ICD-10-CM | POA: Diagnosis present

## 2013-01-23 LAB — COMPREHENSIVE METABOLIC PANEL
ALK PHOS: 134 U/L — AB (ref 39–117)
ALT: 27 U/L (ref 0–53)
AST: 24 U/L (ref 0–37)
Albumin: 3.3 g/dL — ABNORMAL LOW (ref 3.5–5.2)
BUN: 12 mg/dL (ref 6–23)
CO2: 26 mEq/L (ref 19–32)
Calcium: 9.5 mg/dL (ref 8.4–10.5)
Chloride: 102 mEq/L (ref 96–112)
Creatinine, Ser: 0.65 mg/dL (ref 0.50–1.35)
Glucose, Bld: 204 mg/dL — ABNORMAL HIGH (ref 70–99)
POTASSIUM: 3.7 meq/L (ref 3.7–5.3)
Sodium: 141 mEq/L (ref 137–147)
TOTAL PROTEIN: 8.8 g/dL — AB (ref 6.0–8.3)
Total Bilirubin: 0.2 mg/dL — ABNORMAL LOW (ref 0.3–1.2)

## 2013-01-23 LAB — URINALYSIS, ROUTINE W REFLEX MICROSCOPIC
BILIRUBIN URINE: NEGATIVE
Glucose, UA: NEGATIVE mg/dL
Hgb urine dipstick: NEGATIVE
KETONES UR: NEGATIVE mg/dL
Leukocytes, UA: NEGATIVE
Nitrite: NEGATIVE
PROTEIN: NEGATIVE mg/dL
SPECIFIC GRAVITY, URINE: 1.02 (ref 1.005–1.030)
UROBILINOGEN UA: 0.2 mg/dL (ref 0.0–1.0)
pH: 6 (ref 5.0–8.0)

## 2013-01-23 LAB — CBC
HEMATOCRIT: 41.8 % (ref 39.0–52.0)
HEMOGLOBIN: 14.9 g/dL (ref 13.0–17.0)
MCH: 32.5 pg (ref 26.0–34.0)
MCHC: 35.6 g/dL (ref 30.0–36.0)
MCV: 91.1 fL (ref 78.0–100.0)
Platelets: 259 10*3/uL (ref 150–400)
RBC: 4.59 MIL/uL (ref 4.22–5.81)
RDW: 13.6 % (ref 11.5–15.5)
WBC: 9.1 10*3/uL (ref 4.0–10.5)

## 2013-01-23 LAB — PHENYTOIN LEVEL, TOTAL: PHENYTOIN LVL: 3.7 ug/mL — AB (ref 10.0–20.0)

## 2013-01-23 LAB — ETHANOL: Alcohol, Ethyl (B): <11 mg/dL (ref 0–11)

## 2013-01-23 LAB — LIPASE, BLOOD: LIPASE: 231 U/L — AB (ref 11–59)

## 2013-01-23 MED ORDER — MORPHINE SULFATE 2 MG/ML IJ SOLN
2.0000 mg | INTRAMUSCULAR | Status: DC | PRN
  Administered 2013-01-24 – 2013-01-26 (×12): 2 mg via INTRAVENOUS
  Filled 2013-01-23 (×13): qty 1

## 2013-01-23 MED ORDER — HYDROMORPHONE HCL PF 1 MG/ML IJ SOLN
1.0000 mg | Freq: Once | INTRAMUSCULAR | Status: AC
  Administered 2013-01-23: 1 mg via INTRAVENOUS
  Filled 2013-01-23: qty 1

## 2013-01-23 MED ORDER — AMLODIPINE BESYLATE 5 MG PO TABS
10.0000 mg | ORAL_TABLET | Freq: Every day | ORAL | Status: DC
  Administered 2013-01-24 – 2013-01-27 (×4): 10 mg via ORAL
  Filled 2013-01-23 (×4): qty 2

## 2013-01-23 MED ORDER — MUPIROCIN CALCIUM 2 % EX CREA
TOPICAL_CREAM | Freq: Two times a day (BID) | CUTANEOUS | Status: DC
  Filled 2013-01-23: qty 15

## 2013-01-23 MED ORDER — ONDANSETRON HCL 4 MG/2ML IJ SOLN
4.0000 mg | Freq: Once | INTRAMUSCULAR | Status: AC
  Administered 2013-01-23: 4 mg via INTRAVENOUS
  Filled 2013-01-23: qty 2

## 2013-01-23 MED ORDER — SODIUM CHLORIDE 0.9 % IV SOLN
INTRAVENOUS | Status: AC
  Administered 2013-01-24: 12:00:00 via INTRAVENOUS
  Administered 2013-01-24: 1000 mL via INTRAVENOUS

## 2013-01-23 MED ORDER — PANTOPRAZOLE SODIUM 40 MG IV SOLR
40.0000 mg | Freq: Once | INTRAVENOUS | Status: AC
  Administered 2013-01-23: 40 mg via INTRAVENOUS
  Filled 2013-01-23: qty 40

## 2013-01-23 MED ORDER — AMITRIPTYLINE HCL 25 MG PO TABS
25.0000 mg | ORAL_TABLET | Freq: Every day | ORAL | Status: DC
Start: 2013-01-24 — End: 2013-01-27
  Administered 2013-01-24 – 2013-01-26 (×4): 25 mg via ORAL
  Filled 2013-01-23 (×4): qty 1

## 2013-01-23 MED ORDER — ONDANSETRON HCL 4 MG/2ML IJ SOLN: 4.0000 mg | Freq: Three times a day (TID) | INTRAMUSCULAR | Status: AC | PRN

## 2013-01-23 MED ORDER — SODIUM CHLORIDE 0.9 % IV BOLUS (SEPSIS)
1000.0000 mL | Freq: Once | INTRAVENOUS | Status: AC
  Administered 2013-01-23: 1000 mL via INTRAVENOUS

## 2013-01-23 MED ORDER — TRAZODONE HCL 50 MG PO TABS
200.0000 mg | ORAL_TABLET | Freq: Every day | ORAL | Status: DC
  Administered 2013-01-24 – 2013-01-26 (×4): 200 mg via ORAL
  Filled 2013-01-23 (×4): qty 4

## 2013-01-23 MED ORDER — IOHEXOL 300 MG/ML  SOLN
50.0000 mL | Freq: Once | INTRAMUSCULAR | Status: AC | PRN
  Administered 2013-01-23: 50 mL via ORAL

## 2013-01-23 MED ORDER — ENOXAPARIN SODIUM 40 MG/0.4ML ~~LOC~~ SOLN
40.0000 mg | SUBCUTANEOUS | Status: DC
  Administered 2013-01-24 – 2013-01-27 (×4): 40 mg via SUBCUTANEOUS
  Filled 2013-01-23 (×5): qty 0.4

## 2013-01-23 MED ORDER — HYDROMORPHONE HCL PF 1 MG/ML IJ SOLN
1.0000 mg | INTRAMUSCULAR | Status: AC | PRN
  Administered 2013-01-24 (×2): 1 mg via INTRAVENOUS
  Filled 2013-01-23 (×3): qty 1

## 2013-01-23 MED ORDER — PHENYTOIN SODIUM EXTENDED 100 MG PO CAPS: 300.0000 mg | ORAL_CAPSULE | Freq: Every evening | ORAL | Status: DC

## 2013-01-23 MED ORDER — GABAPENTIN 400 MG PO CAPS
1200.0000 mg | ORAL_CAPSULE | Freq: Two times a day (BID) | ORAL | Status: DC
  Administered 2013-01-24 – 2013-01-27 (×8): 1200 mg via ORAL
  Filled 2013-01-23 (×8): qty 3

## 2013-01-23 MED ORDER — ONDANSETRON HCL 4 MG PO TABS: 4.0000 mg | ORAL_TABLET | Freq: Four times a day (QID) | ORAL | Status: DC | PRN

## 2013-01-23 MED ORDER — ONDANSETRON HCL 4 MG/2ML IJ SOLN: 4.0000 mg | Freq: Four times a day (QID) | INTRAMUSCULAR | Status: DC | PRN

## 2013-01-23 MED ORDER — INSULIN ASPART 100 UNIT/ML ~~LOC~~ SOLN
0.0000 [IU] | Freq: Three times a day (TID) | SUBCUTANEOUS | Status: DC
  Administered 2013-01-24: 1 [IU] via SUBCUTANEOUS

## 2013-01-23 NOTE — ED Notes (Signed)
Attempted x 2 for IV access without success, CN in to assess for access

## 2013-01-23 NOTE — ED Notes (Signed)
Patient states taht he can not tolerate pain well.

## 2013-01-23 NOTE — ED Notes (Signed)
Pt states chronic abdominal pain due to pancreatitis. Pain became worse again this morning. N/V/D.  Pt has a PEG tube in place. D/C from hospital on Saturday due to pancreatitis and upper GI bleeding. Pt states he last vomited blood this morning.

## 2013-01-23 NOTE — ED Provider Notes (Signed)
CSN: [id number]     Arrival date & time 01/23/13  1553 History   First MD Initiated Contact with Patient 01/23/13 1712     Chief Complaint  Patient presents with  . Abdominal Pain   (Consider location/radiation/quality/duration/timing/severity/associated sxs/prior Treatment) Patient is a 53 y.o. male presenting with abdominal pain. The history is provided by the patient (the pt complains of abd pain and vomiting).  Abdominal Pain Pain location:  Epigastric Pain quality: aching   Pain radiates to:  Does not radiate Pain severity:  Moderate Onset quality:  Sudden Timing:  Constant Progression:  Worsening Chronicity:  Recurrent Context: not alcohol use   Associated symptoms: no chest pain, no cough, no diarrhea, no fatigue and no hematuria     Past Medical History  Diagnosis Date  . Pancreatitis chronic 08/04/2010  . GERD (gastroesophageal reflux disease)   . Insomnia 08/04/2010  . Seizure 08/04/2010  . Chronic pain syndrome 08/04/2010  . Unspecified essential hypertension 08/04/2010  . Chronic pancreatitis 08/04/2010  . Anxiety states 08/04/2010  . Chronic airway obstruction, not elsewhere classified 08/04/2010  . Tobacco use disorder 08/04/2010  . Other abnormal glucose 08/04/2010  . Coronary artery disease   . Splenic vein thrombosis     Chronic.  Marland Kitchen Hiatal hernia     EGD 01/18/13   Past Surgical History  Procedure Laterality Date  . Jejunostomy feeding tube  07/13/2010  . Pancreatic pseudocyst drainage  07/13/2010  . Eus  03/26/2009    [facility]  . Portacath placement  11/28/2008    [facility]  . Gastrostomy-jejeunostomy tube change/placement  12/11/2008    [facility]  . I&d extremity Left 09/12/2012    Procedure: IRRIGATION AND DEBRIDEMENT LEFT FIFTH FINGER;  Surgeon: Roseanne Kaufman, MD;  Location: [place];  Service: Orthopedics;  Laterality: Left;  . Open reduction internal fixation (orif) metacarpal Left 09/12/2012    Procedure: OPEN REDUCTION INTERNAL FIXATION  LEFT FIFTH FINGER;  Surgeon: Roseanne Kaufman, MD;  Location: [place];  Service: Orthopedics;  Laterality: Left;  . Nerve and tendon repair Left 09/12/2012    Procedure: NERVE AND TENDON REPAIR LEFT FIFTH FINGER;  Surgeon: Roseanne Kaufman, MD;  Location: [place];  Service: Orthopedics;  Laterality: Left;  . Coronary angioplasty with stent placement    . Esophagogastroduodenoscopy N/A 01/18/2013    Procedure: ESOPHAGOGASTRODUODENOSCOPY (EGD);  Surgeon: Rogene Houston, MD;  [facility];  Service: Endoscopy;  Laterality: N/A;   No family history on file. History  Substance Use Topics  . Smoking status: Current Every Day Smoker -- 1.00 packs/day for 20 years    Types: Cigarettes  . Smokeless tobacco: Not on file     Comment: 1 pack a day since age 27  . Alcohol Use: No     Comment: No etoh since 5-6 yrs    Review of Systems  Constitutional: Negative for appetite change and fatigue.  HENT: Negative for congestion, ear discharge and sinus pressure.   Eyes: Negative for discharge.  Respiratory: Negative for cough.   Cardiovascular: Negative for chest pain.  Gastrointestinal: Positive for abdominal pain. Negative for diarrhea.  Genitourinary: Negative for frequency and hematuria.  Musculoskeletal: Negative for back pain.  Skin: Negative for rash.  Neurological: Negative for seizures and headaches.  Psychiatric/Behavioral: Negative for hallucinations.    Allergies  Cheese  Home Medications   Current Outpatient Rx  Name  Route  Sig  Dispense  Refill  . albuterol (PROVENTIL HFA;VENTOLIN HFA) 108 (90 BASE) MCG/ACT inhaler  Inhalation   Inhale 2 puffs into the lungs every 6 (six) hours as needed for wheezing or shortness of breath.          Marland Kitchen amitriptyline (ELAVIL) 25 MG tablet   Oral   Take 25 mg by mouth at bedtime.           Marland Kitchen amLODipine (NORVASC) 10 MG tablet   Oral   Take 10 mg by mouth daily.           . cholecalciferol (VITAMIN D) 1000 UNITS tablet   Oral    Take 1,000 Units by mouth daily.           . diazepam (VALIUM) 10 MG tablet   Oral   Take 10 mg by mouth 3 (three) times daily as needed for anxiety.         . gabapentin (NEURONTIN) 300 MG capsule   Oral   Take 1,200 mg by mouth 2 (two) times daily.         Marland Kitchen loperamide (IMODIUM) 2 MG capsule   Oral   Take 8 mg by mouth daily. *Prescribed one tablet 4 times daily as needed*         . loratadine (CLARITIN) 10 MG tablet   Oral   Take 10 mg by mouth daily.           Marland Kitchen omeprazole (PRILOSEC) 40 MG capsule   Oral   Take 40 mg by mouth 2 (two) times daily.         . phenytoin (DILANTIN) 100 MG ER capsule   Oral   Take 300 mg by mouth every evening.         . potassium chloride (KLOR-CON) 10 MEQ CR tablet   Oral   Take 10 mEq by mouth daily.           Marland Kitchen tiotropium (SPIRIVA) 18 MCG inhalation capsule   Inhalation   Place 18 mcg into inhaler and inhale daily.           . traZODone (DESYREL) 100 MG tablet   Oral   Take 200 mg by mouth at bedtime.          . vitamin B-12 (CYANOCOBALAMIN) 1000 MCG tablet   Oral   Take 1,000 mcg by mouth daily.          BP 134/74  Pulse 57  Temp(Src) 99.2 F (37.3 C) (Oral)  Resp 18  Ht 5\' 7"  (1.702 m)  Wt 111 lb (50.349 kg)  BMI 17.38 kg/m2  SpO2 98% Physical Exam  Constitutional: He is oriented to person, place, and time. He appears well-developed.  HENT:  Head: Normocephalic.  Eyes: Conjunctivae and EOM are normal. No scleral icterus.  Neck: Neck supple. No thyromegaly present.  Cardiovascular: Normal rate and regular rhythm.  Exam reveals no gallop and no friction rub.   No murmur heard. Pulmonary/Chest: No stridor. He has no wheezes. He has no rales. He exhibits no tenderness.  Abdominal: He exhibits no distension. There is tenderness. There is no rebound.  Musculoskeletal: Normal range of motion. He exhibits no edema.  Lymphadenopathy:    He has no cervical adenopathy.  Neurological: He is oriented to  person, place, and time. He exhibits normal muscle tone. Coordination normal.  Skin: No rash noted. No erythema.  Psychiatric: He has a normal mood and affect. His behavior is normal.    ED Course  Procedures (including critical care time) Labs Review Labs Reviewed  COMPREHENSIVE METABOLIC PANEL -  Abnormal; Notable for the following:    Glucose, Bld 204 (*)    Total Protein 8.8 (*)    Albumin 3.3 (*)    Alkaline Phosphatase 134 (*)    Total Bilirubin 0.2 (*)    All other components within normal limits  LIPASE, BLOOD - Abnormal; Notable for the following:    Lipase 231 (*)    All other components within normal limits  CBC  URINALYSIS, ROUTINE W REFLEX MICROSCOPIC  ETHANOL  PHENYTOIN LEVEL, TOTAL   Imaging Review Ct Abdomen Pelvis Wo Contrast  01/23/2013   CLINICAL DATA:  Abdominal pain  EXAM: CT ABDOMEN AND PELVIS WITHOUT CONTRAST  TECHNIQUE: Multidetector CT imaging of the abdomen and pelvis was performed following the standard protocol without intravenous contrast.  COMPARISON:  Prior CT from 01/09/2013  FINDINGS: Dependent subsegmental atelectasis present within the left lung base small pericardial effusion partially visualized.  Scattered calcified granulomas again noted within the liver and spleen. Gallbladder is within normal limits. No biliary ductal dilatation.  Adrenal glands are within normal limits.  Multiple calcifications again seen within the pancreatic body, consistent with chronic pancreatitis. There are persistent peripancreatic fluid collections. The collection superior to the pancreatic body measures approximately in 1.3 x 2.5 cm. The collection at the pancreatic tail measures approximately 2.5 x 3.2 cm. Overall, these are similar as compared to the prior study. No definite new collections identified, however, please note evaluation is limited due to lack of IV contrast. Mild infiltration of the peripancreatic fat is similar.  Percutaneous gastrojejunostomy tube is in the  stomach with jejunal tip in the proximal small bowel, unchanged. No evidence of obstruction. No inflammatory changes seen about the bowels and cells.  Bladder prostate are unremarkable.  No free intraperitoneal air.  Shotty retroperitoneal and upper abdominal adenopathy is stable as compared to the prior exam.  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  No acute osseous abnormality. No focal lytic or blastic osseous lesion.  IMPRESSION: 1. Findings compatible with acute/subacute on chronic pancreatitis as above. Overall, inflammatory changes within the peripancreatic fat with loculated pseudocysts adjacent to the pancreatic body and tail are not significantly changed as compared to the prior exam. No definite new fluid collection, although evaluation is limited due to lack of IV contrast. 2. 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 This is not well evaluated on this noncontrast examination. 3. Trace pericardial effusion.   Electronically Signed   By: Jeannine Boga M.D.   On: 01/23/2013 21:26    EKG Interpretation   None       MDM   1. Pancreatitis        Maudry Diego, MD 01/23/13 2142

## 2013-01-23 NOTE — ED Notes (Signed)
Pt also states roaches were crawling around PEG tube this morning.

## 2013-01-23 NOTE — Progress Notes (Signed)
UR chart review completed.  

## 2013-01-23 NOTE — H&P (Signed)
PCP:   [name], MD   Chief Complaint:  Abdominal pain  HPI: 53 year old male who   has a past medical history of Pancreatitis chronic (08/04/2010); GERD (gastroesophageal reflux disease); Insomnia (08/04/2010); Seizure (08/04/2010); Chronic pain syndrome (08/04/2010); Unspecified essential hypertension (08/04/2010); Chronic pancreatitis (08/04/2010); Anxiety states (08/04/2010); Chronic airway obstruction, not elsewhere classified (08/04/2010); Tobacco use disorder (08/04/2010); Other abnormal glucose (08/04/2010); Coronary artery disease; Splenic vein thrombosis; and Hiatal hernia. Today presented to the ED with worsening abdominal pain. Patient has history of chronic pancreatitis and has PEG tube in place along with Port-A-Cath. Patient says that the pain started 2 days ago was associated with nausea and vomiting. He denies any chest pain denies recent alcohol use. He denies shortness of breath. Admits to having diarrhea. The pain is constant and 8/10 in intensity. Patient says that PEG tube was placed for nutrition has some days is unable to eat. He has also noticed increased discharge around the PEG tube site with pain and irritation around the tube site. In the ED patient was found to have elevated lipase of 231. Phenytoin level is 3.7  Allergies:   Allergies  Allergen Reactions  . Cheese Hives      Past Medical History  Diagnosis Date  . Pancreatitis chronic 08/04/2010  . GERD (gastroesophageal reflux disease)   . Insomnia 08/04/2010  . Seizure 08/04/2010  . Chronic pain syndrome 08/04/2010  . Unspecified essential hypertension 08/04/2010  . Chronic pancreatitis 08/04/2010  . Anxiety states 08/04/2010  . Chronic airway obstruction, not elsewhere classified 08/04/2010  . Tobacco use disorder 08/04/2010  . Other abnormal glucose 08/04/2010  . Coronary artery disease   . Splenic vein thrombosis     Chronic.  Marland Kitchen Hiatal hernia     EGD 01/18/13    Past Surgical History   Procedure Laterality Date  . Jejunostomy feeding tube  07/13/2010  . Pancreatic pseudocyst drainage  07/13/2010  . Eus  03/26/2009    [facility]  . Portacath placement  11/28/2008    [facility]  . Gastrostomy-jejeunostomy tube change/placement  12/11/2008    [facility]  . I&d extremity Left 09/12/2012    Procedure: IRRIGATION AND DEBRIDEMENT LEFT FIFTH FINGER;  Surgeon: Roseanne Kaufman, MD;  Location: [place];  Service: Orthopedics;  Laterality: Left;  . Open reduction internal fixation (orif) metacarpal Left 09/12/2012    Procedure: OPEN REDUCTION INTERNAL FIXATION LEFT FIFTH FINGER;  Surgeon: Roseanne Kaufman, MD;  Location: [place];  Service: Orthopedics;  Laterality: Left;  . Nerve and tendon repair Left 09/12/2012    Procedure: NERVE AND TENDON REPAIR LEFT FIFTH FINGER;  Surgeon: Roseanne Kaufman, MD;  Location: [place];  Service: Orthopedics;  Laterality: Left;  . Coronary angioplasty with stent placement    . Esophagogastroduodenoscopy N/A 01/18/2013    Procedure: ESOPHAGOGASTRODUODENOSCOPY (EGD);  Surgeon: Rogene Houston, MD;  [facility];  Service: Endoscopy;  Laterality: N/A;    Prior to Admission medications   Medication Sig Start Date End Date Taking? Authorizing Provider  albuterol (PROVENTIL HFA;VENTOLIN HFA) 108 (90 BASE) MCG/ACT inhaler Inhale 2 puffs into the lungs every 6 (six) hours as needed for wheezing or shortness of breath.    Yes Historical Provider, MD  amitriptyline (ELAVIL) 25 MG tablet Take 25 mg by mouth at bedtime.     Yes Historical Provider, MD  amLODipine (NORVASC) 10 MG tablet Take 10 mg by mouth daily.     Yes Historical Provider, MD  cholecalciferol (VITAMIN D) 1000 UNITS tablet Take  1,000 Units by mouth daily.     Yes Historical Provider, MD  diazepam (VALIUM) 10 MG tablet Take 10 mg by mouth 3 (three) times daily as needed for anxiety.   Yes Historical Provider, MD  gabapentin (NEURONTIN) 300 MG capsule Take 1,200 mg by mouth 2 (two) times daily.   Yes  Historical Provider, MD  loperamide (IMODIUM) 2 MG capsule Take 8 mg by mouth daily. *Prescribed one tablet 4 times daily as needed*   Yes Historical Provider, MD  loratadine (CLARITIN) 10 MG tablet Take 10 mg by mouth daily.     Yes Historical Provider, MD  omeprazole (PRILOSEC) 40 MG capsule Take 40 mg by mouth 2 (two) times daily.   Yes Historical Provider, MD  phenytoin (DILANTIN) 100 MG ER capsule Take 300 mg by mouth every evening.   Yes Historical Provider, MD  potassium chloride (KLOR-CON) 10 MEQ CR tablet Take 10 mEq by mouth daily.     Yes Historical Provider, MD  tiotropium (SPIRIVA) 18 MCG inhalation capsule Place 18 mcg into inhaler and inhale daily.     Yes Historical Provider, MD  traZODone (DESYREL) 100 MG tablet Take 200 mg by mouth at bedtime.    Yes Historical Provider, MD  vitamin B-12 (CYANOCOBALAMIN) 1000 MCG tablet Take 1,000 mcg by mouth daily.   Yes Historical Provider, MD    Social History:  reports that he has been smoking Cigarettes.  He has a 20 pack-year smoking history. He does not have any smokeless tobacco history on file. He reports that he does not drink alcohol or use illicit drugs.    All the positives are listed in BOLD  Review of Systems:  HEENT: Headache, blurred vision, runny nose, sore throat Neck: Hypothyroidism, hyperthyroidism,,lymphadenopathy Chest : Shortness of breath, history of COPD, Asthma Heart : Chest pain, history of coronary arterey disease GI:  Nausea, vomiting, diarrhea, constipation, GERD GU: Dysuria, urgency, frequency of urination, hematuria Neuro: Stroke, seizures, syncope Psych: Depression, anxiety, hallucinations   Physical Exam: Blood pressure 117/65, pulse 67, temperature 99.2 F (37.3 C), temperature source Oral, resp. rate 16, height $RemoveBe'5\' 7"'WBSgijjaz$  (1.702 m), weight 50.349 kg (111 lb), SpO2 95.00%. Constitutional:   Patient is a well-developed and well-nourished male* in no acute distress and cooperative with exam. Head:  Normocephalic and atraumatic Mouth: Mucus membranes moist Eyes: PERRL, EOMI, conjunctivae normal Neck: Supple, No Thyromegaly Cardiovascular: RRR, S1 normal, S2 normal Pulmonary/Chest: CTAB, no wheezes, rales, or rhonchi Abdominal: Soft. Positive tenderness in the epigastric region, non-distended, PEG tube in place, yellowish discharge noted around the PEG tube site , bowel sounds are normal, no masses, organomegaly, or guarding present.  Neurological: A&O x3, Strenght is normal and symmetric bilaterally, cranial nerve II-XII are grossly intact, no focal motor deficit, sensory intact to light touch bilaterally.  Extremities : No Cyanosis, Clubbing or Edema   Labs on Admission:  Results for orders placed during the hospital encounter of 01/23/13 (from the past 48 hour(s))  CBC     Status: None   Collection Time    01/23/13  4:57 PM      Result Value Range   WBC 9.1  4.0 - 10.5 K/uL   RBC 4.59  4.22 - 5.81 MIL/uL   Hemoglobin 14.9  13.0 - 17.0 g/dL   HCT 41.8  39.0 - 52.0 %   MCV 91.1  78.0 - 100.0 fL   MCH 32.5  26.0 - 34.0 pg   MCHC 35.6  30.0 - 36.0 g/dL  RDW 13.6  11.5 - 15.5 %   Platelets 259  150 - 400 K/uL  COMPREHENSIVE METABOLIC PANEL     Status: Abnormal   Collection Time    01/23/13  4:57 PM      Result Value Range   Sodium 141  137 - 147 mEq/L   Potassium 3.7  3.7 - 5.3 mEq/L   Chloride 102  96 - 112 mEq/L   CO2 26  19 - 32 mEq/L   Glucose, Bld 204 (*) 70 - 99 mg/dL   BUN 12  6 - 23 mg/dL   Creatinine, Ser 0.65  0.50 - 1.35 mg/dL   Calcium 9.5  8.4 - 10.5 mg/dL   Total Protein 8.8 (*) 6.0 - 8.3 g/dL   Albumin 3.3 (*) 3.5 - 5.2 g/dL   AST 24  0 - 37 U/L   ALT 27  0 - 53 U/L   Alkaline Phosphatase 134 (*) 39 - 117 U/L   Total Bilirubin 0.2 (*) 0.3 - 1.2 mg/dL   GFR calc non Af Amer >90  >90 mL/min   GFR calc Af Amer >90  >90 mL/min   Comment: (NOTE)     The eGFR has been calculated using the CKD EPI equation.     This calculation has not been validated in all  clinical situations.     eGFR's persistently <90 mL/min signify possible Chronic Kidney     Disease.  LIPASE, BLOOD     Status: Abnormal   Collection Time    01/23/13  4:57 PM      Result Value Range   Lipase 231 (*) 11 - 59 U/L  ETHANOL     Status: None   Collection Time    01/23/13  4:57 PM      Result Value Range   Alcohol, Ethyl (B) <11  0 - 11 mg/dL   Comment:            LOWEST DETECTABLE LIMIT FOR     SERUM ALCOHOL IS 11 mg/dL     FOR MEDICAL PURPOSES ONLY  URINALYSIS, ROUTINE W REFLEX MICROSCOPIC     Status: None   Collection Time    01/23/13  6:25 PM      Result Value Range   Color, Urine YELLOW  YELLOW   APPearance CLEAR  CLEAR   Specific Gravity, Urine 1.020  1.005 - 1.030   pH 6.0  5.0 - 8.0   Glucose, UA NEGATIVE  NEGATIVE mg/dL   Hgb urine dipstick NEGATIVE  NEGATIVE   Bilirubin Urine NEGATIVE  NEGATIVE   Ketones, ur NEGATIVE  NEGATIVE mg/dL   Protein, ur NEGATIVE  NEGATIVE mg/dL   Urobilinogen, UA 0.2  0.0 - 1.0 mg/dL   Nitrite NEGATIVE  NEGATIVE   Leukocytes, UA NEGATIVE  NEGATIVE   Comment: MICROSCOPIC NOT DONE ON URINES WITH NEGATIVE PROTEIN, BLOOD, LEUKOCYTES, NITRITE, OR GLUCOSE <1000 mg/dL.  PHENYTOIN LEVEL, TOTAL     Status: Abnormal   Collection Time    01/23/13  9:41 PM      Result Value Range   Phenytoin Lvl 3.7 (*) 10.0 - 20.0 ug/mL    Radiological Exams on Admission: Ct Abdomen Pelvis Wo Contrast  01/23/2013   CLINICAL DATA:  Abdominal pain  EXAM: CT ABDOMEN AND PELVIS WITHOUT CONTRAST  TECHNIQUE: Multidetector CT imaging of the abdomen and pelvis was performed following the standard protocol without intravenous contrast.  COMPARISON:  Prior CT from 01/09/2013  FINDINGS: Dependent subsegmental atelectasis present within  the left lung base small pericardial effusion partially visualized.  Scattered calcified granulomas again noted within the liver and spleen. Gallbladder is within normal limits. No biliary ductal dilatation.  Adrenal glands are  within normal limits.  Multiple calcifications again seen within the pancreatic body, consistent with chronic pancreatitis. There are persistent peripancreatic fluid collections. The collection superior to the pancreatic body measures approximately in 1.3 x 2.5 cm. The collection at the pancreatic tail measures approximately 2.5 x 3.2 cm. Overall, these are similar as compared to the prior study. No definite new collections identified, however, please note evaluation is limited due to lack of IV contrast. Mild infiltration of the peripancreatic fat is similar.  Percutaneous gastrojejunostomy tube is in the stomach with jejunal tip in the proximal small bowel, unchanged. No evidence of obstruction. No inflammatory changes seen about the bowels and cells.  Bladder prostate are unremarkable.  No free intraperitoneal air.  Shotty retroperitoneal and upper abdominal adenopathy is stable as compared to the prior exam.  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  No acute osseous abnormality. No focal lytic or blastic osseous lesion.  IMPRESSION: 1. Findings compatible with acute/subacute on chronic pancreatitis as above. Overall, inflammatory changes within the peripancreatic fat with loculated pseudocysts adjacent to the pancreatic body and tail are not significantly changed as compared to the prior exam. No definite new fluid collection, although evaluation is limited due to lack of IV contrast. 2. 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 This is not well evaluated on this noncontrast examination. 3. Trace pericardial effusion.   Electronically Signed   By: Jeannine Boga M.D.   On: 01/23/2013 21:26    Assessment/Plan Principal Problem:   Pancreatitis Active Problems:   Hypertension   Irritation around percutaneous endoscopic gastrostomy (PEG) tube site  seizures  Pancreatitis- patient had a CT scan of the abdomen which showed finding  compatible with subacute/acute on chronic pancreatitis. We'll keep the patient n.p.o. and start IV normal saline at 125 mL per hour. Lipase is elevated to 231, will check lipase in the morning.  PEG tube site cellulitis- patient has yellowish discharge around the PEG tube insertion site, will start Bactroban twice a day along with vancomycin IV for cellulitis. IV antibiotics can be changed to by mouth once the infection improves.  Seizures-patient takes Dilantin 300 mg by mouth daily, though patient says he takes Dilantin everyday, I'm not sure about his compliance. We'll continue with Dilantin at the same dose, will load with Dilantin if he develops seizures in the hospital.   Hypertension- continue the amlodipine  Elevated blood glucose- patient had glucose of 204 in the ED, will initiate sliding scale insulin and obtain hemoglobin A1c.   Code status:Patient is full code  Family discussion:No family at bedside   Time Spent on Admission: 50 min  [facility] Pager: [phone] 01/23/2013, 10:36 PM  If 7PM-7AM, please contact night-coverage  [email or web address]  Password [email or web address]

## 2013-01-24 DIAGNOSIS — K2970 Gastritis, unspecified, without bleeding: Secondary | ICD-10-CM | POA: Diagnosis present

## 2013-01-24 DIAGNOSIS — L0291 Cutaneous abscess, unspecified: Secondary | ICD-10-CM

## 2013-01-24 DIAGNOSIS — E43 Unspecified severe protein-calorie malnutrition: Secondary | ICD-10-CM

## 2013-01-24 DIAGNOSIS — G40909 Epilepsy, unspecified, not intractable, without status epilepticus: Secondary | ICD-10-CM

## 2013-01-24 DIAGNOSIS — K9429 Other complications of gastrostomy: Secondary | ICD-10-CM

## 2013-01-24 DIAGNOSIS — L0390 Cellulitis, unspecified: Secondary | ICD-10-CM | POA: Diagnosis present

## 2013-01-24 LAB — COMPREHENSIVE METABOLIC PANEL
ALT: 20 U/L (ref 0–53)
AST: 19 U/L (ref 0–37)
Albumin: 2.5 g/dL — ABNORMAL LOW (ref 3.5–5.2)
Alkaline Phosphatase: 91 U/L (ref 39–117)
BILIRUBIN TOTAL: 0.2 mg/dL — AB (ref 0.3–1.2)
BUN: 8 mg/dL (ref 6–23)
CO2: 24 meq/L (ref 19–32)
Calcium: 8.1 mg/dL — ABNORMAL LOW (ref 8.4–10.5)
Chloride: 107 mEq/L (ref 96–112)
Creatinine, Ser: 0.64 mg/dL (ref 0.50–1.35)
GFR calc non Af Amer: >90 mL/min (ref >90)
Glucose, Bld: 84 mg/dL (ref 70–99)
POTASSIUM: 3.4 meq/L — AB (ref 3.7–5.3)
SODIUM: 142 meq/L (ref 137–147)
Total Protein: 6.6 g/dL (ref 6.0–8.3)

## 2013-01-24 LAB — CBC
HCT: 32.5 % — ABNORMAL LOW (ref 39.0–52.0)
HEMOGLOBIN: 11.4 g/dL — AB (ref 13.0–17.0)
MCH: 32.6 pg (ref 26.0–34.0)
MCHC: 35.1 g/dL (ref 30.0–36.0)
MCV: 92.9 fL (ref 78.0–100.0)
PLATELETS: 233 10*3/uL (ref 150–400)
RBC: 3.5 MIL/uL — ABNORMAL LOW (ref 4.22–5.81)
RDW: 13.8 % (ref 11.5–15.5)
WBC: 8.1 10*3/uL (ref 4.0–10.5)

## 2013-01-24 LAB — GLUCOSE, CAPILLARY
GLUCOSE-CAPILLARY: 111 mg/dL — AB (ref 70–99)
Glucose-Capillary: 86 mg/dL (ref 70–99)
Glucose-Capillary: 121 mg/dL — ABNORMAL HIGH (ref 70–99)

## 2013-01-24 LAB — HEMOGLOBIN A1C
Hgb A1c MFr Bld: 5.6 % (ref <5.7)
Mean Plasma Glucose: 114 mg/dL (ref <117)

## 2013-01-24 LAB — LIPASE, BLOOD: Lipase: 219 U/L — ABNORMAL HIGH (ref 11–59)

## 2013-01-24 MED ORDER — VANCOMYCIN HCL IN DEXTROSE 750-5 MG/150ML-% IV SOLN
750.0000 mg | Freq: Two times a day (BID) | INTRAVENOUS | Status: DC
  Administered 2013-01-24 – 2013-01-27 (×7): 750 mg via INTRAVENOUS
  Filled 2013-01-24 (×13): qty 150

## 2013-01-24 MED ORDER — VANCOMYCIN HCL IN DEXTROSE 750-5 MG/150ML-% IV SOLN
750.0000 mg | Freq: Once | INTRAVENOUS | Status: AC
  Administered 2013-01-24: 750 mg via INTRAVENOUS
  Filled 2013-01-24: qty 150

## 2013-01-24 MED ORDER — MUPIROCIN 2 % EX OINT
TOPICAL_OINTMENT | Freq: Two times a day (BID) | CUTANEOUS | Status: DC
  Administered 2013-01-24: 10:00:00 via NASAL
  Administered 2013-01-24 (×2): 1 via NASAL
  Administered 2013-01-25 – 2013-01-26 (×3): via NASAL
  Administered 2013-01-26: 1 via NASAL
  Administered 2013-01-27: 09:00:00 via NASAL
  Filled 2013-01-24 (×3): qty 22

## 2013-01-24 MED ORDER — PHENYTOIN SODIUM EXTENDED 100 MG PO CAPS
300.0000 mg | ORAL_CAPSULE | Freq: Every evening | ORAL | Status: DC
  Administered 2013-01-24 – 2013-01-26 (×4): 300 mg via ORAL
  Filled 2013-01-24 (×4): qty 3

## 2013-01-24 MED ORDER — VANCOMYCIN HCL IN DEXTROSE 750-5 MG/150ML-% IV SOLN
INTRAVENOUS | Status: AC
  Filled 2013-01-24: qty 150

## 2013-01-24 MED ORDER — KCL IN DEXTROSE-NACL 40-5-0.9 MEQ/L-%-% IV SOLN
INTRAVENOUS | Status: DC
Start: 2013-01-24 — End: 2013-01-27
  Administered 2013-01-24: 1000 mL via INTRAVENOUS
  Administered 2013-01-24 – 2013-01-25 (×2): via INTRAVENOUS
  Administered 2013-01-25: 1000 mL via INTRAVENOUS
  Administered 2013-01-26 (×2): via INTRAVENOUS
  Filled 2013-01-24 (×19): qty 1000

## 2013-01-24 MED ORDER — PANTOPRAZOLE SODIUM 40 MG IV SOLR
40.0000 mg | INTRAVENOUS | Status: DC
  Administered 2013-01-24 – 2013-01-26 (×3): 40 mg via INTRAVENOUS
  Filled 2013-01-24 (×3): qty 40

## 2013-01-24 NOTE — Progress Notes (Signed)
ANTIBIOTIC CONSULT NOTE  Pharmacy Consult for Vancomycin Indication: Cellulitis   Allergies  Allergen Reactions  . Cheese Hives   Patient Measurements: Height: 5\' 7"  (170.2 cm) Weight: 118 lb 6.4 oz (53.706 kg) IBW/kg (Calculated) : 66.1  Vital Signs: Temp: 97.4 F (36.3 C) (01/13 0557) Temp src: Oral (01/13 0557) BP: 108/57 mmHg (01/13 0557) Pulse Rate: 55 (01/13 0557)  Labs:  Recent Labs  01/23/13 1657 01/24/13 0501  WBC 9.1 8.1  HGB 14.9 11.4*  PLT 259 233  CREATININE 0.65 0.64   Estimated Creatinine Clearance: 82 ml/min (by C-G formula based on Cr of 0.64).  No results found for this basename: VANCOTROUGH, Corlis Leak, VANCORANDOM, GENTTROUGH, GENTPEAK, GENTRANDOM, TOBRATROUGH, TOBRAPEAK, TOBRARND, AMIKACINPEAK, AMIKACINTROU, AMIKACIN,  in the last 72 hours   Microbiology: Recent Results (from the past 720 hour(s))  MRSA PCR SCREENING     Status: None   Collection Time    01/17/13  6:37 AM      Result Value Range Status   MRSA by PCR NEGATIVE  NEGATIVE Final   Comment:            The GeneXpert MRSA Assay (FDA     approved for NASAL specimens     only), is one component of a     comprehensive MRSA colonization     surveillance program. It is not     intended to diagnose MRSA     infection nor to guide or     monitor treatment for     MRSA infections.   Medical History: Past Medical History  Diagnosis Date  . Pancreatitis chronic 08/04/2010  . GERD (gastroesophageal reflux disease)   . Insomnia 08/04/2010  . Seizure 08/04/2010  . Chronic pain syndrome 08/04/2010  . Unspecified essential hypertension 08/04/2010  . Chronic pancreatitis 08/04/2010  . Anxiety states 08/04/2010  . Chronic airway obstruction, not elsewhere classified 08/04/2010  . Tobacco use disorder 08/04/2010  . Other abnormal glucose 08/04/2010  . Coronary artery disease   . Splenic vein thrombosis     Chronic.  Marland Kitchen Hiatal hernia     EGD 01/18/13   Assessment: 53 yo male with  worsening abdominal pain, diarrhea and hx of chronic pancreatitis. Pt also has PEG tube in place with discharge and irritation around insertion site. Vancomycin for cellulitis.  Pt has good renal fxn.  Estimated Creatinine Clearance: 82 ml/min (by C-G formula based on Cr of 0.64).  Vancomycin 1/13 >>  Goal of Therapy:  Vancomycin troughs 10-15 mcg/ml.  Plan:  Vancomycin 750mg  IV q12hrs Check trough at steady state Monitor labs, renal fxn, and cultures  Ena Dawley, RPH 01/24/2013,8:09 AM

## 2013-01-24 NOTE — Progress Notes (Signed)
INITIAL NUTRITION ASSESSMENT  DOCUMENTATION CODES Per approved criteria  -Severe malnutrition in the context of chronic disease -Underweight   INTERVENTION:   (When pt is ready to resume tube feeding: recommend Continuous Vital AF 1.2 @ 40 ml/hr via PEG/ PEJ which will provide 1152 kcal, 72 gr protein, 758 ml water. NUTRITION DIAGNOSIS: Inadequate oral intake related to altered GI function as evidenced by pancreatitis and NPO status.   Goal: Pt to meet >/= 90% of their estimated nutrition needs ; not met  Monitor:  Po intake, labs and wt trends  Reason for Assessment: Home TF  53 y.o. male  Admitting Dx: Pancreatitis  ASSESSMENT:   Pt is 53 yo male whose hx significant for pancreatitis, chronic abdominal pain. His is NPO currently. Significant wt gain of  3 kg x 6 days. RD assessed pt on 01/18/13.  Pt is s/p jejunostomy placement 12/11/2008. His home EN is Vital AF 1.2 (3 cans or 711 ml /day) from 2100-0700 daily. Also receives supplemental folic acid, thiamine and MVI daily.   Height: Ht Readings from Last 1 Encounters:  01/23/13 $RemoveB'5\' 7"'Lrintgpg$  (1.702 m)    Weight: Wt Readings from Last 1 Encounters:  01/23/13 118 lb 6.4 oz (53.706 kg)    Ideal Body Weight: 142#   % Ideal Body Weight: 79%  Wt Readings from Last 10 Encounters:  01/23/13 118 lb 6.4 oz (53.706 kg)  01/17/13 111 lb 15.9 oz (50.8 kg)  01/17/13 111 lb 15.9 oz (50.8 kg)  01/09/13 123 lb (55.792 kg)  10/04/12 133 lb (60.328 kg)  03/09/11 148 lb 4.8 oz (67.268 kg)  01/29/11 145 lb (65.772 kg)  01/29/11 145 lb (65.772 kg)  11/05/10 145 lb 4.8 oz (65.908 kg)  08/13/10 139 lb 14.4 oz (63.458 kg)    Usual Body Weight: 133#  % Usual Body Weight: 84%  BMI:  Body mass index is 18.54 kg/(m^2).underweight  Estimated Nutritional Needs: Kcal: 1800-2100 Protein: 80-90 gr Fluid: 1800 ml daily  Skin: intact  Diet Order: NPO  EDUCATION NEEDS: -Education needs addressed   Intake/Output Summary (Last 24 hours)  at 01/24/13 1039 Last data filed at 01/24/13 1012  Gross per 24 hour  Intake      0 ml  Output    400 ml  Net   -400 ml    Last BM: 01/23/13  Labs:   Recent Labs Lab 01/21/13 0657 01/23/13 1657 01/24/13 0501  NA 139 141 142  K 4.3 3.7 3.4*  CL 103 102 107  CO2 $Re'26 26 24  'Goj$ BUN $R'9 12 8  'RW$ CREATININE 0.59 0.65 0.64  CALCIUM 8.7 9.5 8.1*  GLUCOSE 131* 204* 84    CBG (last 3)  No results found for this basename: GLUCAP,  in the last 72 hours  Scheduled Meds: . sodium chloride   Intravenous STAT  . amitriptyline  25 mg Oral QHS  . amLODipine  10 mg Oral Daily  . enoxaparin (LOVENOX) injection  40 mg Subcutaneous Q24H  . gabapentin  1,200 mg Oral BID  . insulin aspart  0-9 Units Subcutaneous TID WC  . mupirocin ointment   Nasal BID  . phenytoin  300 mg Oral QPM  . traZODone  200 mg Oral QHS  . vancomycin  750 mg Intravenous Q12H    Continuous Infusions:    Past Medical History  Diagnosis Date  . Pancreatitis chronic 08/04/2010  . GERD (gastroesophageal reflux disease)   . Insomnia 08/04/2010  . Seizure 08/04/2010  . Chronic pain syndrome  08/04/2010  . Unspecified essential hypertension 08/04/2010  . Chronic pancreatitis 08/04/2010  . Anxiety states 08/04/2010  . Chronic airway obstruction, not elsewhere classified 08/04/2010  . Tobacco use disorder 08/04/2010  . Other abnormal glucose 08/04/2010  . Coronary artery disease   . Splenic vein thrombosis     Chronic.  Marland Kitchen Hiatal hernia     EGD 01/18/13    Past Surgical History  Procedure Laterality Date  . Jejunostomy feeding tube  07/13/2010  . Pancreatic pseudocyst drainage  07/13/2010  . Eus  03/26/2009    [facility]  . Portacath placement  11/28/2008    [facility]  . Gastrostomy-jejeunostomy tube change/placement  12/11/2008    [facility]  . I&d extremity Left 09/12/2012    Procedure: IRRIGATION AND DEBRIDEMENT LEFT FIFTH FINGER;  Surgeon: Roseanne Kaufman, MD;  Location: [place];  Service: Orthopedics;   Laterality: Left;  . Open reduction internal fixation (orif) metacarpal Left 09/12/2012    Procedure: OPEN REDUCTION INTERNAL FIXATION LEFT FIFTH FINGER;  Surgeon: Roseanne Kaufman, MD;  Location: [place];  Service: Orthopedics;  Laterality: Left;  . Nerve and tendon repair Left 09/12/2012    Procedure: NERVE AND TENDON REPAIR LEFT FIFTH FINGER;  Surgeon: Roseanne Kaufman, MD;  Location: [place];  Service: Orthopedics;  Laterality: Left;  . Coronary angioplasty with stent placement    . Esophagogastroduodenoscopy N/A 01/18/2013    Procedure: ESOPHAGOGASTRODUODENOSCOPY (EGD);  Surgeon: Rogene Houston, MD;  [facility];  Service: Endoscopy;  Laterality: N/A;    Colman Cater MS,RD,CSG,LDN Office: [phone] Pager: [phone]

## 2013-01-24 NOTE — Telephone Encounter (Signed)
Marcus Brooks has already spoken to [name] about this and was scheduled but it was canceled.  He is IP again.

## 2013-01-24 NOTE — Progress Notes (Addendum)
[facility] PROGRESS NOTE  Marcus Brooks QVZ:[id number] DOB: [birth year] DOA: 01/23/2013 PCP: [name], MD    Code Status: None Family Communication: None available Disposition Plan: Discharge to home when clinically appropriate   Consultants:  None  Procedures:  None  Antibiotics:  Vancomycin 01/24/2013.  Topical Bactroban 01/24/2013.  HPI/Subjective: The patient complains of thirst despite getting IV fluids. He says that his mouth is dry. His abdominal pain has subsided from last night. He denies nausea, vomiting, or diarrhea.  Objective: Filed Vitals:   01/24/13 0557  BP: 108/57  Pulse: 55  Temp: 97.4 F (36.3 C)  Resp: 19   Oxygen saturation 95% on room air.   Intake/Output Summary (Last 24 hours) at 01/24/13 1312 Last data filed at 01/24/13 1012  Gross per 24 hour  Intake      0 ml  Output    400 ml  Net   -400 ml   Filed Weights   01/23/13 1624 01/23/13 2320  Weight: 50.349 kg (111 lb) 53.706 kg (118 lb 6.4 oz)    Exam:   General: Disheveled-appearing 53 year old man sitting up in bed, in no acute distress.  Cardiovascular: S1, S2, with a soft systolic murmur.  Respiratory: Clear to auscultation bilaterally.  Abdomen: Mild erythema around the PEG tube site. Hypoactive bowel sounds, soft, mild epigastric tenderness without masses palpated. No distention.  Musculoskeletal: No acute hot joints. No pedal edema.  Neurologic/psychiatric: He has a flat affect. He is alert and oriented x3. Cranial nerves II through XII are grossly intact.  Data Reviewed: Basic Metabolic Panel:  Recent Labs Lab 01/19/13 0442 01/20/13 0444 01/21/13 0657 01/23/13 1657 01/24/13 0501  NA 136* 138 139 141 142  K 4.1 4.1 4.3 3.7 3.4*  CL 100 102 103 102 107  CO2 25 26 26 26 24   GLUCOSE 97 163* 131* 204* 84  BUN 12 7 9 12 8   CREATININE 0.72 0.62 0.59 0.65 0.64  CALCIUM 8.8 8.7 8.7 9.5 8.1*   Liver Function Tests:  Recent Labs Lab 01/18/13 0507  01/21/13 0657 01/23/13 1657 01/24/13 0501  AST 17 20 24 19   ALT 17 18 27 20   ALKPHOS 111 110 134* 91  BILITOT 0.3 0.2* 0.2* 0.2*  PROT 7.5 7.1 8.8* 6.6  ALBUMIN 2.9* 2.6* 3.3* 2.5*    Recent Labs Lab 01/19/13 0442 01/21/13 0657 01/23/13 1657 01/24/13 0501  LIPASE 161* 142* 231* 219*   No results found for this basename: AMMONIA,  in the last 168 hours CBC:  Recent Labs Lab 01/17/13 2144 01/18/13 0507 01/19/13 0442 01/23/13 1657 01/24/13 0501  WBC  --  14.8* 11.5* 9.1 8.1  HGB 13.7 12.9* 12.2* 14.9 11.4*  HCT 41.2 38.4* 36.6* 41.8 32.5*  MCV  --  93.9 93.1 91.1 92.9  PLT  --  269 236 259 233   Cardiac Enzymes: No results found for this basename: CKTOTAL, CKMB, CKMBINDEX, TROPONINI,  in the last 168 hours BNP (last 3 results) No results found for this basename: PROBNP,  in the last 8760 hours CBG:  Recent Labs Lab 01/24/13 0742 01/24/13 1116  GLUCAP 86 121*    Recent Results (from the past 240 hour(s))  MRSA PCR SCREENING     Status: None   Collection Time    01/17/13  6:37 AM      Result Value Range Status   MRSA by PCR NEGATIVE  NEGATIVE Final   Comment:            The  GeneXpert MRSA Assay (FDA     approved for NASAL specimens     only), is one component of a     comprehensive MRSA colonization     surveillance program. It is not     intended to diagnose MRSA     infection nor to guide or     monitor treatment for     MRSA infections.     Studies: Ct Abdomen Pelvis Wo Contrast  01/23/2013   CLINICAL DATA:  Abdominal pain  EXAM: CT ABDOMEN AND PELVIS WITHOUT CONTRAST  TECHNIQUE: Multidetector CT imaging of the abdomen and pelvis was performed following the standard protocol without intravenous contrast.  COMPARISON:  Prior CT from 01/09/2013  FINDINGS: Dependent subsegmental atelectasis present within the left lung base small pericardial effusion partially visualized.  Scattered calcified granulomas again noted within the liver and spleen.  Gallbladder is within normal limits. No biliary ductal dilatation.  Adrenal glands are within normal limits.  Multiple calcifications again seen within the pancreatic body, consistent with chronic pancreatitis. There are persistent peripancreatic fluid collections. The collection superior to the pancreatic body measures approximately in 1.3 x 2.5 cm. The collection at the pancreatic tail measures approximately 2.5 x 3.2 cm. Overall, these are similar as compared to the prior study. No definite new collections identified, however, please note evaluation is limited due to lack of IV contrast. Mild infiltration of the peripancreatic fat is similar.  Percutaneous gastrojejunostomy tube is in the stomach with jejunal tip in the proximal small bowel, unchanged. No evidence of obstruction. No inflammatory changes seen about the bowels and cells.  Bladder prostate are unremarkable.  No free intraperitoneal air.  Shotty retroperitoneal and upper abdominal adenopathy is stable as compared to the prior exam.  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  No acute osseous abnormality. No focal lytic or blastic osseous lesion.  IMPRESSION: 1. Findings compatible with acute/subacute on chronic pancreatitis as above. Overall, inflammatory changes within the peripancreatic fat with loculated pseudocysts adjacent to the pancreatic body and tail are not significantly changed as compared to the prior exam. No definite new fluid collection, although evaluation is limited due to lack of IV contrast. 2. 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 This is not well evaluated on this noncontrast examination. 3. Trace pericardial effusion.   Electronically Signed   By: Jeannine Boga M.D.   On: 01/23/2013 21:26    Scheduled Meds: . [COMPLETED] sodium chloride   Intravenous STAT  . amitriptyline  25 mg Oral QHS  . amLODipine  10 mg Oral Daily  . enoxaparin (LOVENOX)  injection  40 mg Subcutaneous Q24H  . gabapentin  1,200 mg Oral BID  . insulin aspart  0-9 Units Subcutaneous TID WC  . mupirocin ointment   Nasal BID  . phenytoin  300 mg Oral QPM  . traZODone  200 mg Oral QHS  . vancomycin  750 mg Intravenous Q12H   Continuous Infusions:   Assessment:  Principal Problem:   Acute pancreatitis Active Problems:   Chronic pancreatitis   Cellulitis   Gastritis   Hypertension   Protein-calorie malnutrition, severe   Chronic pain syndrome   Irritation around percutaneous endoscopic gastrostomy (PEG) tube site   1. Acute on chronic pancreatitis. CT scan of the abdomen findings are consistent with subacute/acute on chronic pancreatitis-there are changes within the peripancreatic fat with loculated pseudocysts adjacent to the pancreatic body and tail, not significantly changed compared to prior  exam. There are no new fluid collections. His lipase is more elevated than when it was discharged a few days ago. He denies alcohol use. We'll continue bowel rest. We'll allow ice chips. Continue IV analgesics as needed. Continue IV fluid hydration. Continue to follow his lipase.  Recent hospitalization with hematemesis secondary to gastritis. No complaints of hematemesis during this admission. Will restart IV Protonix.  PEG tube dependent secondary to severe protein calorie malnutrition. The patient is able to eat when he is not in pain. When he is no longer n.p.o., will ask the registered dietitian for recommendations regarding restarting tube feedings. Will keep him virtually n.p.o. for now.  PEG site cellulitis. The patient will need his PEG tube exchanged as an outpatient. This was to be set up by Dr. [name] or his primary care physician. It cannot be done as an inpatient I believe because of his insurance issues or protocol by IR. This was noted during the previous hospitalization. We'll continue topical Bactroban and IV vancomycin and started.  Hypertension.  We'll continue amlodipine.  Seizure disorder. We'll continue Dilantin. Apparently, his phenytoin level was 3.7 and a loading dose of Dilantin was given in the ED.  Mild hypokalemia. We'll add potassium chloride to the IV fluids and we'll check a magnesium level tomorrow morning.     Plan: 1. We'll allow ice chips for now. We'll start clear liquids when he is less symptomatic and/or when his lipase levels decreased. 2. Registered dietitian will need to be consulted when he is no longer virtually n.p.o. 3. We'll monitor his lipase levels. 4. Add potassium chloride to the IV fluids. We'll also add dextrose to the IV fluids.   Time spent: 35 minutes.    [facility] Pager [phone] If 7PM-7AM, please contact night-coverage at [email or web address], password [email or web address] 01/24/2013, 1:12 PM  LOS: 1 day

## 2013-01-24 NOTE — Progress Notes (Signed)
ANTIBIOTIC CONSULT NOTE-Preliminary  Pharmacy Consult for Vancomycin Indication: Cellulitis   Allergies  Allergen Reactions  . Cheese Hives    Patient Measurements: Height: 5\' 7"  (170.2 cm) Weight: 118 lb 6.4 oz (53.706 kg) IBW/kg (Calculated) : 66.1   Vital Signs: Temp: 97.6 F (36.4 C) (01/12 2320) Temp src: Oral (01/12 2320) BP: 107/66 mmHg (01/12 2320) Pulse Rate: 58 (01/12 2320)  Labs:  Recent Labs  01/21/13 0657 01/23/13 1657  WBC  --  9.1  HGB  --  14.9  PLT  --  259  CREATININE 0.59 0.65    Estimated Creatinine Clearance: 82 ml/min (by C-G formula based on Cr of 0.65).  No results found for this basename: VANCOTROUGH, Corlis Leak, VANCORANDOM, GENTTROUGH, GENTPEAK, GENTRANDOM, TOBRATROUGH, TOBRAPEAK, TOBRARND, AMIKACINPEAK, AMIKACINTROU, AMIKACIN,  in the last 72 hours   Microbiology: Recent Results (from the past 720 hour(s))  MRSA PCR SCREENING     Status: None   Collection Time    01/17/13  6:37 AM      Result Value Range Status   MRSA by PCR NEGATIVE  NEGATIVE Final   Comment:            The GeneXpert MRSA Assay (FDA     approved for NASAL specimens     only), is one component of a     comprehensive MRSA colonization     surveillance program. It is not     intended to diagnose MRSA     infection nor to guide or     monitor treatment for     MRSA infections.    Medical History: Past Medical History  Diagnosis Date  . Pancreatitis chronic 08/04/2010  . GERD (gastroesophageal reflux disease)   . Insomnia 08/04/2010  . Seizure 08/04/2010  . Chronic pain syndrome 08/04/2010  . Unspecified essential hypertension 08/04/2010  . Chronic pancreatitis 08/04/2010  . Anxiety states 08/04/2010  . Chronic airway obstruction, not elsewhere classified 08/04/2010  . Tobacco use disorder 08/04/2010  . Other abnormal glucose 08/04/2010  . Coronary artery disease   . Splenic vein thrombosis     Chronic.  Marland Kitchen Hiatal hernia     EGD 01/18/13     Medications:   Assessment: 53 yo male with worsening abdominal pain, diarrhea and hx of chronic pancreatitis. Pt also has PEG tube in place with discharge and irritation around insertion site. Vancomycin for cellulitis.   Goal of Therapy:  Vancomycin troughs 10-15 mcg/ml.  Plan:  Preliminary review of pertinent patient information completed.  Protocol will be initiated with a one-time dose of Vancomycin 750 mg IV.  Forestine Na clinical pharmacist will complete review during morning rounds to assess patient and finalize treatment regimen.  Norberto Sorenson, [facility] 01/24/2013,2:22 AM

## 2013-01-24 NOTE — Progress Notes (Signed)
UR chart review completed.  

## 2013-01-24 NOTE — Care Management Note (Addendum)
    Page 1 of 1   01/27/2013     12:59:50 PM   CARE MANAGEMENT NOTE 01/27/2013  Patient:  Marcus Brooks, Marcus Brooks   Account Number:  [id number]  Date Initiated:  01/24/2013  Documentation initiated by:  Theophilus Kinds  Subjective/Objective Assessment:   Pt readmitted from home with pancreatitis. Pt lives alone in a "boarding house" per the pt and will return to home at discharge. Pt is estranged from his wife. Pt was given on last admission resources for homeless shelters and pt refuses to     Action/Plan:   agree to go to one at discharge. Pt is active with [facility] RN and CSW. Pt also gets PEG tube supplies from [facility]. Pt was also given list of food pantries and [employer] information on last admission and encouraged pt to go to DSS for food sta.   Anticipated DC Date:  01/28/2013   Anticipated DC Plan:  [place]  CM consult      Choice offered to / List presented to:             Status of service:  Completed, signed off Medicare Important Message given?   (If response is "NO", the following Medicare IM given date fields will be blank) Date Medicare IM given:   Date Additional Medicare IM given:    Discharge Disposition:  [place]  Per UR Regulation:    If discussed at Long Length of Stay Meetings, dates discussed:    Comments:  01/27/13 [place], RN BSN CM Pt discharged home today with resumption of [facility] Rn and CSW. Romualdo Bolk of [facility] is aware will collect the pts information from the chart. [place] services to start within 48 hours of discharge. Pt is going to stay with his exwife until other arrangements can be made for housing. Pt is refusing placement as offered by CSW. Pt and pts nurse aware of discharge arrangements.  01/24/13 [place], RN BSN CM

## 2013-01-25 DIAGNOSIS — K861 Other chronic pancreatitis: Secondary | ICD-10-CM

## 2013-01-25 LAB — GLUCOSE, CAPILLARY
GLUCOSE-CAPILLARY: 146 mg/dL — AB (ref 70–99)
Glucose-Capillary: 104 mg/dL — ABNORMAL HIGH (ref 70–99)
Glucose-Capillary: 126 mg/dL — ABNORMAL HIGH (ref 70–99)
Glucose-Capillary: 140 mg/dL — ABNORMAL HIGH (ref 70–99)
Glucose-Capillary: 171 mg/dL — ABNORMAL HIGH (ref 70–99)

## 2013-01-25 LAB — BASIC METABOLIC PANEL
BUN: 5 mg/dL — ABNORMAL LOW (ref 6–23)
CALCIUM: 8.3 mg/dL — AB (ref 8.4–10.5)
CO2: 24 meq/L (ref 19–32)
Chloride: 111 mEq/L (ref 96–112)
Creatinine, Ser: 0.62 mg/dL (ref 0.50–1.35)
GFR calc Af Amer: >90 mL/min (ref >90)
GFR calc non Af Amer: >90 mL/min (ref >90)
GLUCOSE: 147 mg/dL — AB (ref 70–99)
Potassium: 4.4 mEq/L (ref 3.7–5.3)
Sodium: 143 mEq/L (ref 137–147)

## 2013-01-25 LAB — LIPASE, BLOOD: LIPASE: 243 U/L — AB (ref 11–59)

## 2013-01-25 MED ORDER — SODIUM CHLORIDE 0.9 % IV SOLN
INTRAVENOUS | Status: DC
  Administered 2013-01-26 – 2013-01-27 (×2): via INTRAVENOUS

## 2013-01-25 MED ORDER — VITAL AF 1.2 CAL PO LIQD
1000.0000 mL | ORAL | Status: DC
  Administered 2013-01-25: 1000 mL
  Filled 2013-01-25 (×2): qty 1000

## 2013-01-25 NOTE — Progress Notes (Signed)
[facility] PROGRESS NOTE  JERAMIAH MCCAUGHEY LHT:[id number] DOB: [birth year] DOA: 01/23/2013 PCP: [name], MD  Assessment/Plan: 1. Acute on chronic pancreatitis. CT scan of the abdomen are consistent with subacute/acute on chronic pancreatitis. There was no significant change from prior exam. There may be evidence of developing pseudocysts. Lipase remains elevated. He continues to have abdominal pain. Continue with supportive treatment. Continue bowel rest as well as IV fluids and analgesics. We'll continue to follow lipase levels. He denies any alcohol use. Patient is currently receiving tube feeds through his PEG tube. This will be held for now to promote bowel rest. 2. PEG site cellulitis. Continue Bactroban and vancomycin for now. His PEG tube will need to be exchanged as an outpatient. 3. Seizure disorder. Continue Dilantin. 4. Hypertension. Stable 5. Hypokalemia. Improved  Code Status: full code Family Communication: none present Disposition Plan: discharge home when improved   Consultants:  none  Procedures:  none  Antibiotics:  Vancomycin 1/13  Bactroban 1/13  HPI/Subjective: Continued abdominal pain, nausea, no vomiting  Objective: Filed Vitals:   01/25/13 1436  BP: 133/77  Pulse: 63  Temp: 98.4 F (36.9 C)  Resp: 18    Intake/Output Summary (Last 24 hours) at 01/25/13 1542 Last data filed at 01/25/13 1437  Gross per 24 hour  Intake    120 ml  Output   2625 ml  Net  -2505 ml   Filed Weights   01/23/13 1624 01/23/13 2320  Weight: 50.349 kg (111 lb) 53.706 kg (118 lb 6.4 oz)    Exam:   General:  NAD  Cardiovascular: S1,S2 RRR  Respiratory: cta b  Abdomen: soft, tender in periumbilical area, bs+, PEG tube in place  Musculoskeletal: no edema b/l   Data Reviewed: Basic Metabolic Panel:  Recent Labs Lab 01/20/13 0444 01/21/13 0657 01/23/13 1657 01/24/13 0501 01/25/13 0443  NA 138 139 141 142 143  K 4.1 4.3 3.7 3.4* 4.4  CL 102  103 102 107 111  CO2 26 26 26 24 24   GLUCOSE 163* 131* 204* 84 147*  BUN 7 9 12 8  5*  CREATININE 0.62 0.59 0.65 0.64 0.62  CALCIUM 8.7 8.7 9.5 8.1* 8.3*   Liver Function Tests:  Recent Labs Lab 01/21/13 0657 01/23/13 1657 01/24/13 0501  AST 20 24 19   ALT 18 27 20   ALKPHOS 110 134* 91  BILITOT 0.2* 0.2* 0.2*  PROT 7.1 8.8* 6.6  ALBUMIN 2.6* 3.3* 2.5*    Recent Labs Lab 01/19/13 0442 01/21/13 0657 01/23/13 1657 01/24/13 0501 01/25/13 0443  LIPASE 161* 142* 231* 219* 243*   No results found for this basename: AMMONIA,  in the last 168 hours CBC:  Recent Labs Lab 01/19/13 0442 01/23/13 1657 01/24/13 0501  WBC 11.5* 9.1 8.1  HGB 12.2* 14.9 11.4*  HCT 36.6* 41.8 32.5*  MCV 93.1 91.1 92.9  PLT 236 259 233   Cardiac Enzymes: No results found for this basename: CKTOTAL, CKMB, CKMBINDEX, TROPONINI,  in the last 168 hours BNP (last 3 results) No results found for this basename: PROBNP,  in the last 8760 hours CBG:  Recent Labs Lab 01/24/13 1116 01/24/13 1711 01/25/13 01/25/13 0733 01/25/13 1129  GLUCAP 121* 111* 171* 146* 140*    Recent Results (from the past 240 hour(s))  MRSA PCR SCREENING     Status: None   Collection Time    01/17/13  6:37 AM      Result Value Range Status   MRSA by PCR NEGATIVE  NEGATIVE Final  Comment:            The GeneXpert MRSA Assay (FDA     approved for NASAL specimens     only), is one component of a     comprehensive MRSA colonization     surveillance program. It is not     intended to diagnose MRSA     infection nor to guide or     monitor treatment for     MRSA infections.     Studies: Ct Abdomen Pelvis Wo Contrast  01/23/2013   CLINICAL DATA:  Abdominal pain  EXAM: CT ABDOMEN AND PELVIS WITHOUT CONTRAST  TECHNIQUE: Multidetector CT imaging of the abdomen and pelvis was performed following the standard protocol without intravenous contrast.  COMPARISON:  Prior CT from 01/09/2013  FINDINGS: Dependent subsegmental  atelectasis present within the left lung base small pericardial effusion partially visualized.  Scattered calcified granulomas again noted within the liver and spleen. Gallbladder is within normal limits. No biliary ductal dilatation.  Adrenal glands are within normal limits.  Multiple calcifications again seen within the pancreatic body, consistent with chronic pancreatitis. There are persistent peripancreatic fluid collections. The collection superior to the pancreatic body measures approximately in 1.3 x 2.5 cm. The collection at the pancreatic tail measures approximately 2.5 x 3.2 cm. Overall, these are similar as compared to the prior study. No definite new collections identified, however, please note evaluation is limited due to lack of IV contrast. Mild infiltration of the peripancreatic fat is similar.  Percutaneous gastrojejunostomy tube is in the stomach with jejunal tip in the proximal small bowel, unchanged. No evidence of obstruction. No inflammatory changes seen about the bowels and cells.  Bladder prostate are unremarkable.  No free intraperitoneal air.  Shotty retroperitoneal and upper abdominal adenopathy is stable as compared to the prior exam.  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  No acute osseous abnormality. No focal lytic or blastic osseous lesion.  IMPRESSION: 1. Findings compatible with acute/subacute on chronic pancreatitis as above. Overall, inflammatory changes within the peripancreatic fat with loculated pseudocysts adjacent to the pancreatic body and tail are not significantly changed as compared to the prior exam. No definite new fluid collection, although evaluation is limited due to lack of IV contrast. 2. 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 This is not well evaluated on this noncontrast examination. 3. Trace pericardial effusion.   Electronically Signed   By: Jeannine Boga M.D.   On: 01/23/2013 21:26     Scheduled Meds: . amitriptyline  25 mg Oral QHS  . amLODipine  10 mg Oral Daily  . enoxaparin (LOVENOX) injection  40 mg Subcutaneous Q24H  . feeding supplement (VITAL AF 1.2 CAL)  1,000 mL Per Tube Q24H  . gabapentin  1,200 mg Oral BID  . mupirocin ointment   Nasal BID  . pantoprazole (PROTONIX) IV  40 mg Intravenous Q24H  . phenytoin  300 mg Oral QPM  . traZODone  200 mg Oral QHS  . vancomycin  750 mg Intravenous Q12H   Continuous Infusions: . dextrose 5 % and 0.9 % NaCl with KCl 40 mEq/L 125 mL/hr at 01/25/13 1515    Principal Problem:   Acute pancreatitis Active Problems:   Hypertension   Seizure disorder   Protein-calorie malnutrition, severe   Chronic pancreatitis   Chronic pain syndrome   Irritation around percutaneous endoscopic gastrostomy (PEG) tube site   Cellulitis   Gastritis    Time  spent: 103mins    MEMON,JEHANZEB  [facility] Pager [phone]. If 7PM-7AM, please contact night-coverage at [email or web address], password [facility] 01/25/2013, 3:42 PM  LOS: 2 days

## 2013-01-25 NOTE — Progress Notes (Signed)
Nutrition Follow-up   INTERVENTION:   Continuous Vital AF 1.2 @ 40 ml/hr via PEG/ PEJ today which will provide 1152 kcal, 72 gr protein, 758 ml water q 24 hrs.  RD will continue to follow.  NUTRITION DIAGNOSIS: Inadequate oral intake related to altered GI function as evidenced by pancreatitis and NPO status.   Goal: Pt to meet >/= 90% of their estimated nutrition needs ; not met  Monitor: tolerance enteral nutrition, labs and wt trends  Reason for Assessment: Home TF  53 y.o. male  Admitting Dx: Acute pancreatitis  ASSESSMENT: Pt reports improved abdominal pain. No episodes of vomiting. He has been cleared to resume enteral nutrition. Will provide nutrition continuously while he is here to maximize intake. We'll continue with his home formula which is elemental (hydrolzed protein, fish-oil based lipid) to help manage inflammation and promote GI tolerance.   Pt is 53 yo male whose hx significant for pancreatitis, chronic abdominal pain. His is NPO currently. Significant wt gain of  3 kg x 6 days. RD assessed pt on 01/18/13.  Pt is s/p jejunostomy placement 12/11/2008. His home EN is Vital AF 1.2 (3 cans or 711 ml /day) from 2100-0700 daily. Also receives supplemental folic acid, thiamine and MVI daily.   Height: Ht Readings from Last 1 Encounters:  01/23/13 $RemoveB'5\' 7"'mrASgZAs$  (1.702 m)    Weight: Wt Readings from Last 1 Encounters:  01/23/13 118 lb 6.4 oz (53.706 kg)    Ideal Body Weight: 142#   % Ideal Body Weight: 79%  Wt Readings from Last 10 Encounters:  01/23/13 118 lb 6.4 oz (53.706 kg)  01/17/13 111 lb 15.9 oz (50.8 kg)  01/17/13 111 lb 15.9 oz (50.8 kg)  01/09/13 123 lb (55.792 kg)  10/04/12 133 lb (60.328 kg)  03/09/11 148 lb 4.8 oz (67.268 kg)  01/29/11 145 lb (65.772 kg)  01/29/11 145 lb (65.772 kg)  11/05/10 145 lb 4.8 oz (65.908 kg)  08/13/10 139 lb 14.4 oz (63.458 kg)    Usual Body Weight: 133#  % Usual Body Weight: 84%  BMI:  Body mass index is 18.54  kg/(m^2).underweight  Estimated Nutritional Needs: Kcal: 1800-2100 Protein: 80-90 gr Fluid: 1800 ml daily  Skin: intact  Diet Order: NPO  EDUCATION NEEDS: -Education needs addressed   Intake/Output Summary (Last 24 hours) at 01/25/13 0823 Last data filed at 01/25/13 0403  Gross per 24 hour  Intake      0 ml  Output   2225 ml  Net  -2225 ml    Last BM: 01/23/13  Labs:   Recent Labs Lab 01/23/13 1657 01/24/13 0501 01/25/13 0443  NA 141 142 143  K 3.7 3.4* 4.4  CL 102 107 111  CO2 $Re'26 24 24  'lnf$ BUN 12 8 5*  CREATININE 0.65 0.64 0.62  CALCIUM 9.5 8.1* 8.3*  GLUCOSE 204* 84 147*    CBG (last 3)   Recent Labs  01/24/13 1711 01/25/13 01/25/13 0733  GLUCAP 111* 171* 146*    Scheduled Meds: . amitriptyline  25 mg Oral QHS  . amLODipine  10 mg Oral Daily  . enoxaparin (LOVENOX) injection  40 mg Subcutaneous Q24H  . feeding supplement (VITAL AF 1.2 CAL)  1,000 mL Per Tube Q24H  . gabapentin  1,200 mg Oral BID  . mupirocin ointment   Nasal BID  . pantoprazole (PROTONIX) IV  40 mg Intravenous Q24H  . phenytoin  300 mg Oral QPM  . traZODone  200 mg Oral QHS  . vancomycin  750 mg Intravenous Q12H    Continuous Infusions: . dextrose 5 % and 0.9 % NaCl with KCl 40 mEq/L 1,000 mL (01/25/13 6962)    Past Medical History  Diagnosis Date  . Pancreatitis chronic 08/04/2010  . GERD (gastroesophageal reflux disease)   . Insomnia 08/04/2010  . Seizure 08/04/2010  . Chronic pain syndrome 08/04/2010  . Unspecified essential hypertension 08/04/2010  . Chronic pancreatitis 08/04/2010  . Anxiety states 08/04/2010  . Chronic airway obstruction, not elsewhere classified 08/04/2010  . Tobacco use disorder 08/04/2010  . Other abnormal glucose 08/04/2010  . Coronary artery disease   . Splenic vein thrombosis     Chronic.  Marland Kitchen Hiatal hernia     EGD 01/18/13    Past Surgical History  Procedure Laterality Date  . Jejunostomy feeding tube  07/13/2010  . Pancreatic  pseudocyst drainage  07/13/2010  . Eus  03/26/2009    [facility]  . Portacath placement  11/28/2008    [facility]  . Gastrostomy-jejeunostomy tube change/placement  12/11/2008    [facility]  . I&d extremity Left 09/12/2012    Procedure: IRRIGATION AND DEBRIDEMENT LEFT FIFTH FINGER;  Surgeon: Roseanne Kaufman, MD;  Location: [place];  Service: Orthopedics;  Laterality: Left;  . Open reduction internal fixation (orif) metacarpal Left 09/12/2012    Procedure: OPEN REDUCTION INTERNAL FIXATION LEFT FIFTH FINGER;  Surgeon: Roseanne Kaufman, MD;  Location: [place];  Service: Orthopedics;  Laterality: Left;  . Nerve and tendon repair Left 09/12/2012    Procedure: NERVE AND TENDON REPAIR LEFT FIFTH FINGER;  Surgeon: Roseanne Kaufman, MD;  Location: [place];  Service: Orthopedics;  Laterality: Left;  . Coronary angioplasty with stent placement    . Esophagogastroduodenoscopy N/A 01/18/2013    Procedure: ESOPHAGOGASTRODUODENOSCOPY (EGD);  Surgeon: Rogene Houston, MD;  [facility];  Service: Endoscopy;  Laterality: N/A;    Colman Cater MS,RD,CSG,LDN Office: [phone] Pager: [phone]

## 2013-01-26 LAB — GLUCOSE, CAPILLARY
GLUCOSE-CAPILLARY: 102 mg/dL — AB (ref 70–99)
GLUCOSE-CAPILLARY: 112 mg/dL — AB (ref 70–99)
Glucose-Capillary: 112 mg/dL — ABNORMAL HIGH (ref 70–99)

## 2013-01-26 LAB — BASIC METABOLIC PANEL
BUN: 5 mg/dL — AB (ref 6–23)
CALCIUM: 8.3 mg/dL — AB (ref 8.4–10.5)
CO2: 25 meq/L (ref 19–32)
Chloride: 110 mEq/L (ref 96–112)
Creatinine, Ser: 0.64 mg/dL (ref 0.50–1.35)
GFR calc Af Amer: >90 mL/min (ref >90)
GFR calc non Af Amer: >90 mL/min (ref >90)
GLUCOSE: 121 mg/dL — AB (ref 70–99)
Potassium: 4.4 mEq/L (ref 3.7–5.3)
SODIUM: 143 meq/L (ref 137–147)

## 2013-01-26 LAB — LIPASE, BLOOD: Lipase: 145 U/L — ABNORMAL HIGH (ref 11–59)

## 2013-01-26 MED ORDER — HYDROCODONE-ACETAMINOPHEN 5-325 MG PO TABS
1.0000 | ORAL_TABLET | ORAL | Status: DC | PRN
  Administered 2013-01-26 – 2013-01-27 (×5): 2 via ORAL
  Filled 2013-01-26 (×5): qty 2

## 2013-01-26 MED ORDER — TUBERCULIN PPD 5 UNIT/0.1ML ID SOLN
5.0000 [IU] | Freq: Once | INTRADERMAL | Status: DC
  Filled 2013-01-26: qty 0.1

## 2013-01-26 NOTE — Clinical Social Work Placement (Signed)
Clinical Social Work Department CLINICAL SOCIAL WORK PLACEMENT NOTE 01/26/2013  Patient:  Marcus Brooks, Marcus Brooks  Account Number:  [id number] Admit date:  01/23/2013  Clinical Social Worker:  Benay Pike, LCSW  Date/time:  01/26/2013 03:50 PM  Clinical Social Work is seeking post-discharge placement for this patient at the following level of care:   ASSISTED LIVING/REST HOME   (*CSW will update this form in Epic as items are completed)   01/26/2013  Patient/family provided with [place] Department of Clinical Social Work's list of facilities offering this level of care within the geographic area requested by the patient (or if unable, by the patient's family).  01/26/2013  Patient/family informed of their freedom to choose among providers that offer the needed level of care, that participate in Medicare, Medicaid or managed care program needed by the patient, have an available bed and are willing to accept the patient.  01/26/2013  Patient/family informed of [facility]' ownership interest in [facility], as well as of the fact that they are under no obligation to receive care at this facility.  PASARR submitted to EDS on 01/26/2013 PASARR number received from EDS on 01/26/2013  FL2 transmitted to all facilities in geographic area requested by pt/family on  01/26/2013 FL2 transmitted to all facilities within larger geographic area on   Patient informed that his/her managed care company has contracts with or will negotiate with  certain facilities, including the following:     Patient/family informed of bed offers received:   Patient chooses bed at  Physician recommends and patient chooses bed at    Patient to be transferred to  on   Patient to be transferred to facility by   The following physician request were entered in Epic:   Additional Comments:   Benay Pike, [place]

## 2013-01-26 NOTE — Clinical Social Work Psychosocial (Signed)
Clinical Social Work Department BRIEF PSYCHOSOCIAL ASSESSMENT 01/26/2013  Patient:  Marcus Brooks, Marcus Brooks     Account Number:  [id number]     Admit date:  01/23/2013  Clinical Social Worker:  Wyatt Haste  Date/Time:  01/26/2013 03:50 PM  Referred by:  CSW  Date Referred:  01/26/2013 Referred for  ALF Placement   Other Referral:   Interview type:  Patient Other interview type:    PSYCHOSOCIAL DATA Living Status:  ALONE Admitted from facility:   Level of care:   Primary support name:  Marcus Brooks Primary support relationship to patient:  FAMILY Degree of support available:   ex-wife; limited support    CURRENT CONCERNS Current Concerns  Post-Acute Placement   Other Concerns:    SOCIAL WORK ASSESSMENT / PLAN CSW met with pt at bedside. Pt known to CSW from previous admissions. He returned to his boarding house after last hospitalization as he did not want to consider placement at the time. Pt alert and oriented. He appears to have limited support. His ex-wife went to the boarding house today and found an eviction notice on his door. Pt aware that they should provide 30 day notice, but would rather just look into ALF/RH placement at this time. He is aware of Medicaid payment and that he would keep little of his check if qualifies. Pt has a PEG tube which he handles himself. He reports that Medicaid covers tube feeding cost. ALF list provided and placement discussed. He indicates that he is ready to be in a facility where he has assistance if needed. Pt is independent with ADLs. He has had several admissions recently due to acute on chronic pancreatitis. Pt requests [facility] if possible. CSW spoke with Marcus Brooks who is Scientist, physiological for several facilities in [place]. She is aware of PEG tube. Pt has home health RN through [facility] which can be resumed. Marcus Brooks plans to assess pt in AM and is checking whether pt qualifies with his Medicaid.   Assessment/plan status:  Psychosocial  Support/Ongoing Assessment of Needs Other assessment/ plan:   Information/referral to community resources:   ALF list    PATIENT'S/FAMILY'S RESPONSE TO PLAN OF CARE: Pt very open to discussing ALF/RH placement due to current situation where he is now evicted. CSW will initiate bed search and follow up with bed offers when available.       Benay Pike, [place]

## 2013-01-26 NOTE — Progress Notes (Addendum)
Nutrition Follow-up   INTERVENTION:   RD will continue to follow nutrition care.  Resume enteral nutrition as MD considers medially appropriate  NUTRITION DIAGNOSIS: Inadequate oral intake related to altered GI function as evidenced by pancreatitis and NPO status.   Goal: Pt to meet >/= 90% of their estimated nutrition needs ; not met  Monitor: tolerance enteral nutrition, labs and wt trends  53 y.o. male  Admitting Dx: Acute pancreatitis  ASSESSMENT: Pt sitting up in bed. C/o abdominal pain today. CTon 01/23/13 reports Percutaneous gastrojejunostomy tube is in the stomach with jejunal  tip in the proximal small bowel, unchanged. No evidence of  obstruction. No inflammatory changes seen about the bowels and  cells. Tube feeding held for bowel rest. He has been on TPN in the past. Will continue to follow nutrition care plan.   Height: Ht Readings from Last 1 Encounters:  01/23/13 _0  (1.702 m)    Weight: Wt Readings from Last 1 Encounters:  01/26/13 121 lb 12.8 oz (55.248 kg)    Ideal Body Weight: 142#   % Ideal Body Weight: 79%  Wt Readings from Last 10 Encounters:  01/26/13 121 lb 12.8 oz (55.248 kg)  01/17/13 111 lb 15.9 oz (50.8 kg)  01/17/13 111 lb 15.9 oz (50.8 kg)  01/09/13 123 lb (55.792 kg)  10/04/12 133 lb (60.328 kg)  03/09/11 148 lb 4.8 oz (67.268 kg)  01/29/11 145 lb (65.772 kg)  01/29/11 145 lb (65.772 kg)  11/05/10 145 lb 4.8 oz (65.908 kg)  08/13/10 139 lb 14.4 oz (63.458 kg)    Usual Body Weight: 133#  % Usual Body Weight: 84%  BMI:  Body mass index is 19.07 kg/(m^2).underweight  Estimated Nutritional Needs: Kcal: 1800-2100 Protein: 80-90 gr Fluid: 1800 ml daily  Skin: intact  Diet Order: NPO  EDUCATION NEEDS: -Education needs addressed   Intake/Output Summary (Last 24 hours) at 01/26/13 1220 Last data filed at 01/26/13 0631  Gross per 24 hour  Intake 4357.5 ml  Output   1200 ml  Net 3157.5 ml    Last BM:  01/26/13  Labs:   Recent Labs Lab 01/24/13 0501 01/25/13 0443 01/26/13 0447  NA 142 143 143  K 3.4* 4.4 4.4  CL 107 111 110  CO2 _1 BUN 8 5* 5*  CREATININE 0.64 0.62 0.64  CALCIUM 8.1* 8.3* 8.3*  GLUCOSE 84 147* 121*    CBG (last 3)   Recent Labs  01/25/13 2152 01/26/13 0801 01/26/13 1155  GLUCAP 104* 112* 102*    Scheduled Meds: . amitriptyline  25 mg Oral QHS  . amLODipine  10 mg Oral Daily  . enoxaparin (LOVENOX) injection  40 mg Subcutaneous Q24H  . gabapentin  1,200 mg Oral BID  . mupirocin ointment   Nasal BID  . pantoprazole (PROTONIX) IV  40 mg Intravenous Q24H  . phenytoin  300 mg Oral QPM  . traZODone  200 mg Oral QHS  . vancomycin  750 mg Intravenous Q12H    Continuous Infusions: . sodium chloride    . dextrose 5 % and 0.9 % NaCl with KCl 40 mEq/L 125 mL/hr at 01/26/13 1009    Past Medical History  Diagnosis Date  . Pancreatitis chronic 08/04/2010  . GERD (gastroesophageal reflux disease)   . Insomnia 08/04/2010  . Seizure 08/04/2010  . Chronic pain syndrome 08/04/2010  . Unspecified essential hypertension 08/04/2010  . Chronic pancreatitis 08/04/2010  . Anxiety states 08/04/2010  . Chronic airway obstruction, not elsewhere classified  08/04/2010  . Tobacco use disorder 08/04/2010  . Other abnormal glucose 08/04/2010  . Coronary artery disease   . Splenic vein thrombosis     Chronic.  Marland Kitchen Hiatal hernia     EGD 01/18/13    Past Surgical History  Procedure Laterality Date  . Jejunostomy feeding tube  07/13/2010  . Pancreatic pseudocyst drainage  07/13/2010  . Eus  03/26/2009    [facility]  . Portacath placement  11/28/2008    [facility]  . Gastrostomy-jejeunostomy tube change/placement  12/11/2008    [facility]  . I&d extremity Left 09/12/2012    Procedure: IRRIGATION AND DEBRIDEMENT LEFT FIFTH FINGER;  Surgeon: Roseanne Kaufman, MD;  Location: [place];  Service: Orthopedics;  Laterality: Left;  . Open reduction internal fixation  (orif) metacarpal Left 09/12/2012    Procedure: OPEN REDUCTION INTERNAL FIXATION LEFT FIFTH FINGER;  Surgeon: Roseanne Kaufman, MD;  Location: [place];  Service: Orthopedics;  Laterality: Left;  . Nerve and tendon repair Left 09/12/2012    Procedure: NERVE AND TENDON REPAIR LEFT FIFTH FINGER;  Surgeon: Roseanne Kaufman, MD;  Location: [place];  Service: Orthopedics;  Laterality: Left;  . Coronary angioplasty with stent placement    . Esophagogastroduodenoscopy N/A 01/18/2013    Procedure: ESOPHAGOGASTRODUODENOSCOPY (EGD);  Surgeon: Rogene Houston, MD;  [facility];  Service: Endoscopy;  Laterality: N/A;    Colman Cater MS,RD,CSG,LDN Office: [phone] Pager: [phone]

## 2013-01-26 NOTE — Progress Notes (Signed)
[facility] PROGRESS NOTE  Marcus Brooks VCB:[id number] DOB: 1960-01-31 DOA: 01/23/2013 PCP: [name], MD  Assessment/Plan: 1. Acute on chronic pancreatitis. CT scan of the abdomen are consistent with subacute/acute on chronic pancreatitis. There was no significant change from prior exam. There may be evidence of developing pseudocysts. Lipase is trending down. Clinically he feels to be improving. We'll restart the patient on tube feeds. Start him on clear liquids. Discussed with Dr. [name] and would not advance diet any further for the next month, until he is seen in followup. 2. PEG site cellulitis. Continue Bactroban and vancomycin for now. His PEG tube will need to be exchanged as an outpatient. Will plan on discharging on oral Bactrim 3. Seizure disorder. Continue Dilantin. 4. Hypertension. Stable 5. Hypokalemia. Improved  Code Status: full code Family Communication: none present Disposition Plan: discharge home when improved   Consultants:  none  Procedures:  none  Antibiotics:  Vancomycin 1/13  Bactroban 1/13  HPI/Subjective: Feels better today. No nausea or vomiting. Abdominal pain is improving  Objective: Filed Vitals:   01/26/13 1411  BP: 150/63  Pulse: 52  Temp: 97.8 F (36.6 C)  Resp: 18    Intake/Output Summary (Last 24 hours) at 01/26/13 1848 Last data filed at 01/26/13 0631  Gross per 24 hour  Intake 735.42 ml  Output    800 ml  Net -64.58 ml   Filed Weights   01/23/13 1624 01/23/13 2320 01/26/13 0407  Weight: 50.349 kg (111 lb) 53.706 kg (118 lb 6.4 oz) 55.248 kg (121 lb 12.8 oz)    Exam:   General:  NAD  Cardiovascular: S1,S2 RRR  Respiratory: cta b  Abdomen: soft, tender in periumbilical area, bs+, PEG tube in place  Musculoskeletal: no edema b/l   Data Reviewed: Basic Metabolic Panel:  Recent Labs Lab 01/21/13 0657 01/23/13 1657 01/24/13 0501 01/25/13 0443 01/26/13 0447  NA 139 141 142 143 143  K 4.3 3.7 3.4*  4.4 4.4  CL 103 102 107 111 110  CO2 26 26 24 24 25   GLUCOSE 131* 204* 84 147* 121*  BUN 9 12 8  5* 5*  CREATININE 0.59 0.65 0.64 0.62 0.64  CALCIUM 8.7 9.5 8.1* 8.3* 8.3*   Liver Function Tests:  Recent Labs Lab 01/21/13 0657 01/23/13 1657 01/24/13 0501  AST 20 24 19   ALT 18 27 20   ALKPHOS 110 134* 91  BILITOT 0.2* 0.2* 0.2*  PROT 7.1 8.8* 6.6  ALBUMIN 2.6* 3.3* 2.5*    Recent Labs Lab 01/21/13 0657 01/23/13 1657 01/24/13 0501 01/25/13 0443 01/26/13 0447  LIPASE 142* 231* 219* 243* 145*   No results found for this basename: AMMONIA,  in the last 168 hours CBC:  Recent Labs Lab 01/23/13 1657 01/24/13 0501  WBC 9.1 8.1  HGB 14.9 11.4*  HCT 41.8 32.5*  MCV 91.1 92.9  PLT 259 233   Cardiac Enzymes: No results found for this basename: CKTOTAL, CKMB, CKMBINDEX, TROPONINI,  in the last 168 hours BNP (last 3 results) No results found for this basename: PROBNP,  in the last 8760 hours CBG:  Recent Labs Lab 01/25/13 1129 01/25/13 1610 01/25/13 2152 01/26/13 0801 01/26/13 1155  GLUCAP 140* 126* 104* 112* 102*    Recent Results (from the past 240 hour(s))  MRSA PCR SCREENING     Status: None   Collection Time    01/17/13  6:37 AM      Result Value Range Status   MRSA by PCR NEGATIVE  NEGATIVE Final  Comment:            The GeneXpert MRSA Assay (FDA     approved for NASAL specimens     only), is one component of a     comprehensive MRSA colonization     surveillance program. It is not     intended to diagnose MRSA     infection nor to guide or     monitor treatment for     MRSA infections.     Studies: No results found.  Scheduled Meds: . amitriptyline  25 mg Oral QHS  . amLODipine  10 mg Oral Daily  . enoxaparin (LOVENOX) injection  40 mg Subcutaneous Q24H  . gabapentin  1,200 mg Oral BID  . mupirocin ointment   Nasal BID  . pantoprazole (PROTONIX) IV  40 mg Intravenous Q24H  . phenytoin  300 mg Oral QPM  . traZODone  200 mg Oral QHS  .  vancomycin  750 mg Intravenous Q12H   Continuous Infusions: . sodium chloride 100 mL/hr at 01/26/13 1803  . dextrose 5 % and 0.9 % NaCl with KCl 40 mEq/L 125 mL/hr at 01/26/13 1009    Principal Problem:   Acute pancreatitis Active Problems:   Hypertension   Seizure disorder   Protein-calorie malnutrition, severe   Chronic pancreatitis   Chronic pain syndrome   Irritation around percutaneous endoscopic gastrostomy (PEG) tube site   Cellulitis   Gastritis    Time spent: 59mins    Fard Borunda  [facility] Pager [phone]. If 7PM-7AM, please contact night-coverage at [email or web address], password [facility] 01/26/2013, 6:48 PM  LOS: 3 days

## 2013-01-27 LAB — LIPASE, BLOOD: LIPASE: 118 U/L — AB (ref 11–59)

## 2013-01-27 LAB — GLUCOSE, CAPILLARY
GLUCOSE-CAPILLARY: 121 mg/dL — AB (ref 70–99)
Glucose-Capillary: 107 mg/dL — ABNORMAL HIGH (ref 70–99)
Glucose-Capillary: 130 mg/dL — ABNORMAL HIGH (ref 70–99)

## 2013-01-27 LAB — BASIC METABOLIC PANEL
BUN: 4 mg/dL — ABNORMAL LOW (ref 6–23)
CHLORIDE: 108 meq/L (ref 96–112)
CO2: 25 mEq/L (ref 19–32)
Calcium: 8.6 mg/dL (ref 8.4–10.5)
Creatinine, Ser: 0.64 mg/dL (ref 0.50–1.35)
GFR calc Af Amer: >90 mL/min (ref >90)
Glucose, Bld: 76 mg/dL (ref 70–99)
POTASSIUM: 3.9 meq/L (ref 3.7–5.3)
Sodium: 143 mEq/L (ref 137–147)

## 2013-01-27 MED ORDER — HYDROCODONE-ACETAMINOPHEN 5-325 MG PO TABS: 1.0000 | ORAL_TABLET | ORAL | Status: DC | PRN

## 2013-01-27 MED ORDER — TUBERCULIN PPD 5 UNIT/0.1ML ID SOLN
5.0000 [IU] | Freq: Once | INTRADERMAL | Status: DC
  Administered 2013-01-27: 5 [IU] via INTRADERMAL
  Filled 2013-01-27: qty 0.1

## 2013-01-27 MED ORDER — DIAZEPAM 5 MG PO TABS
10.0000 mg | ORAL_TABLET | Freq: Three times a day (TID) | ORAL | Status: DC | PRN
  Administered 2013-01-27: 10 mg via ORAL
  Filled 2013-01-27: qty 2

## 2013-01-27 MED ORDER — MUPIROCIN 2 % EX OINT: TOPICAL_OINTMENT | Freq: Two times a day (BID) | CUTANEOUS | Status: DC

## 2013-01-27 MED ORDER — VITAL AF 1.2 CAL PO LIQD
1000.0000 mL | ORAL | Status: DC
  Administered 2013-01-27: 1000 mL
  Filled 2013-01-27 (×4): qty 1000

## 2013-01-27 NOTE — Plan of Care (Signed)
TB test admin in Left underside forearm.  Site circled in pen for future reading.  Pt educated on what to look for and what we were doing.

## 2013-01-27 NOTE — Discharge Summary (Signed)
Physician Discharge Summary  Marcus Brooks HEN:[id number] DOB: November 19, 1960 DOA: 01/23/2013  PCP: [name], MD  Admit date: 01/23/2013 Discharge date: 01/27/2013  Time spent: 40 minutes  Recommendations for Outpatient Follow-up:  1. Clear liquids until seen in follow up with Dr. [name] in 4 weeks 2. Continue tube feedings  Discharge Diagnoses:  Principal Problem:   Acute pancreatitis Active Problems:   Hypertension   Seizure disorder   Protein-calorie malnutrition, severe   Chronic pancreatitis   Chronic pain syndrome   Irritation around percutaneous endoscopic gastrostomy (PEG) tube site   Cellulitis   Gastritis   Discharge Condition: improved  Diet recommendation: clear liquids until seen by Dr. [name], tube feedings  [facility] Weights   01/23/13 1624 01/23/13 2320 01/26/13 0407  Weight: 50.349 kg (111 lb) 53.706 kg (118 lb 6.4 oz) 55.248 kg (121 lb 12.8 oz)    History of present illness:  53 year old male who has a past medical history of Pancreatitis chronic (08/04/2010); GERD (gastroesophageal reflux disease); Insomnia (08/04/2010); Seizure (08/04/2010); Chronic pain syndrome (08/04/2010); Unspecified essential hypertension (08/04/2010); Chronic pancreatitis (08/04/2010); Anxiety states (08/04/2010); Chronic airway obstruction, not elsewhere classified (08/04/2010); Tobacco use disorder (08/04/2010); Other abnormal glucose (08/04/2010); Coronary artery disease; Splenic vein thrombosis; and Hiatal hernia.  Today presented to the ED with worsening abdominal pain. Patient has history of chronic pancreatitis and has PEG tube in place along with Port-A-Cath. Patient says that the pain started 2 days ago was associated with nausea and vomiting. He denies any chest pain denies recent alcohol use. He denies shortness of breath. Admits to having diarrhea. The pain is constant and 8/10 in intensity. Patient says that PEG tube was placed for nutrition has some days is unable to eat. He has  also noticed increased discharge around the PEG tube site with pain and irritation around the tube site.  In the ED patient was found to have elevated lipase of 231. Phenytoin level is 3.7   Hospital Course:  This patient was admitted to the hospital with acute on chronic pancreatitis. The patient has chronic pancreatitis and receives tube feedings via a PEG/PEJ tube. He was treated conservatively with bowel rest, IV fluids and pain management. CT scan of the abdomen and pelvis indicated possible developing pseudocyst. For his hospital course, the patient has improved. Currently is on clear liquids and is tolerating this without any significant worsening of his pain. He is continued on tube feeds. Case was discussed with Dr. [name] who is familiar with the patient. Recommendations have been for the patient to stay on clear liquids until he is seen in followup with Dr. [name] in 1 month. He'll be continued on tube feeding. Regarding the cellulitis noted around his PEG tube, patient was started on vancomycin as well as Bactroban cream. His cellulitis has improved. I do not feel he needs oral antibiotics on discharge. Arrangements are being made to have his PEG tube exchange with interventional radiology as an outpatient. This has been arranged by gastroenterology. His pain is controlled, the patient is ambulating in the halls and is requesting discharge home. Social work had seen the patient and was willing to assist with assisted-living facility placement. The patient has declined this and will be discharged home with his ex-wife until more permanent accomodations can be found.  Procedures:  none  Consultations:  none  Discharge Exam: Filed Vitals:   01/27/13 0840  BP: 122/82  Pulse:   Temp:   Resp:     General: NAD Cardiovascular: S1, S2 RRR  Respiratory: CTA B  Discharge Instructions  Discharge Orders   Future Orders Complete By Expires   Increase activity slowly  As directed         Medication List         albuterol 108 (90 BASE) MCG/ACT inhaler  Commonly known as:  PROVENTIL HFA;VENTOLIN HFA  Inhale 2 puffs into the lungs every 6 (six) hours as needed for wheezing or shortness of breath.     amitriptyline 25 MG tablet  Commonly known as:  ELAVIL  Take 25 mg by mouth at bedtime.     amLODipine 10 MG tablet  Commonly known as:  NORVASC  Take 10 mg by mouth daily.     cholecalciferol 1000 UNITS tablet  Commonly known as:  VITAMIN D  Take 1,000 Units by mouth daily.     diazepam 10 MG tablet  Commonly known as:  VALIUM  Take 10 mg by mouth 3 (three) times daily as needed for anxiety.     gabapentin 300 MG capsule  Commonly known as:  NEURONTIN  Take 1,200 mg by mouth 2 (two) times daily.     HYDROcodone-acetaminophen 5-325 MG per tablet  Commonly known as:  NORCO/VICODIN  Take 1 tablet by mouth every 4 (four) hours as needed for moderate pain.     loperamide 2 MG capsule  Commonly known as:  IMODIUM  Take 8 mg by mouth daily. *Prescribed one tablet 4 times daily as needed*     loratadine 10 MG tablet  Commonly known as:  CLARITIN  Take 10 mg by mouth daily.     mupirocin ointment 2 %  Commonly known as:  BACTROBAN  Place into the nose 2 (two) times daily.     omeprazole 40 MG capsule  Commonly known as:  PRILOSEC  Take 40 mg by mouth 2 (two) times daily.     phenytoin 100 MG ER capsule  Commonly known as:  DILANTIN  Take 300 mg by mouth every evening.     potassium chloride 10 MEQ CR tablet  Commonly known as:  KLOR-CON  Take 10 mEq by mouth daily.     tiotropium 18 MCG inhalation capsule  Commonly known as:  SPIRIVA  Place 18 mcg into inhaler and inhale daily.     traZODone 100 MG tablet  Commonly known as:  DESYREL  Take 200 mg by mouth at bedtime.     vitamin B-12 1000 MCG tablet  Commonly known as:  CYANOCOBALAMIN  Take 1,000 mcg by mouth daily.       Allergies  Allergen Reactions  . Cheese Hives       Follow-up  Information   Follow up with [name], MD. (on wednesday as scheduled)    Specialty:  Family Medicine   Contact information:   [facility] [place] [address] [phone]       Follow up with Rogene Houston, MD. Schedule an appointment as soon as possible for a visit in 4 weeks.   Specialty:  Gastroenterology   Contact information:   [address], [address] [place] [address] [phone]        The results of significant diagnostics from this hospitalization (including imaging, microbiology, ancillary and laboratory) are listed below for reference.    Significant Diagnostic Studies: Ct Abdomen Pelvis Wo Contrast  01/23/2013   CLINICAL DATA:  Abdominal pain  EXAM: CT ABDOMEN AND PELVIS WITHOUT CONTRAST  TECHNIQUE: Multidetector CT imaging of the abdomen  and pelvis was performed following the standard protocol without intravenous contrast.  COMPARISON:  Prior CT from 01/09/2013  FINDINGS: Dependent subsegmental atelectasis present within the left lung base small pericardial effusion partially visualized.  Scattered calcified granulomas again noted within the liver and spleen. Gallbladder is within normal limits. No biliary ductal dilatation.  Adrenal glands are within normal limits.  Multiple calcifications again seen within the pancreatic body, consistent with chronic pancreatitis. There are persistent peripancreatic fluid collections. The collection superior to the pancreatic body measures approximately in 1.3 x 2.5 cm. The collection at the pancreatic tail measures approximately 2.5 x 3.2 cm. Overall, these are similar as compared to the prior study. No definite new collections identified, however, please note evaluation is limited due to lack of IV contrast. Mild infiltration of the peripancreatic fat is similar.  Percutaneous gastrojejunostomy tube is in the stomach with jejunal tip in the proximal small bowel, unchanged. No evidence of obstruction. No  inflammatory changes seen about the bowels and cells.  Bladder prostate are unremarkable.  No free intraperitoneal air.  Shotty retroperitoneal and upper abdominal adenopathy is stable as compared to the prior exam.  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  No acute osseous abnormality. No focal lytic or blastic osseous lesion.  IMPRESSION: 1. Findings compatible with acute/subacute on chronic pancreatitis as above. Overall, inflammatory changes within the peripancreatic fat with loculated pseudocysts adjacent to the pancreatic body and tail are not significantly changed as compared to the prior exam. No definite new fluid collection, although evaluation is limited due to lack of IV contrast. 2. 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 This is not well evaluated on this noncontrast examination. 3. Trace pericardial effusion.   Electronically Signed   By: Jeannine Boga M.D.   On: 01/23/2013 21:26   Ct Abdomen Pelvis W Contrast  01/09/2013   CLINICAL DATA:  Abdominal pain, nausea, vomiting, syncope. History of pancreatitis.  EXAM: CT ABDOMEN AND PELVIS WITH CONTRAST  TECHNIQUE: Multidetector CT imaging of the abdomen and pelvis was performed using the standard protocol following bolus administration of intravenous contrast.  CONTRAST:  57mL OMNIPAQUE IOHEXOL 300 MG/ML SOLN, 152mL OMNIPAQUE IOHEXOL 300 MG/ML SOLN  COMPARISON:  10/03/2012  FINDINGS: Mild infiltration or dependent atelectasis in the lung bases. Tiny pulmonary nodules measuring less than 2 mm diameter. Mild distal esophageal wall thickening may represent reflux or inflammatory process.  Calcified granulomas in the liver and spleen. Stable appearance of tiny cysts in the liver. Periportal edema. No bile duct dilatation. Gallbladder is normal. Pancreas demonstrates multiple calcifications consistent with chronic pancreatitis. Since the previous study, the perihepatic fluid collection  has resolved and peripancreatic fluid collections adjacent to the body and tail have decreased in size. Upper abdominal fluid and infiltrative changes are decreasing. Changes are consistent with improving pancreatitis. Multiple lymph nodes are present in the upper abdomen without pathologic enlargement. Splenic vein is tortuous and splenic varices appear to be present. 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 Gastrojejunostomy tube is in place. Stomach, small bowel, and colon are not abnormally distended. No free air in the abdomen. The kidneys, adrenal glands, abdominal aorta, inferior vena cava, and retroperitoneal lymph nodes are unremarkable.  Pelvis: Prostate gland is enlarged. Bladder wall is not thickened. No free or loculated pelvic fluid collections. Appendix is normal. No diverticulitis.  IMPRESSION: Improving inflammatory changes in the peripancreatic and upper abdominal regions consistent with improving acute pancreatitis. Small residual pseudo cysts are smaller. Calcification  in the pancreas consistent with chronic pancreatitis. 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 No evidence of progression since previous study.   Electronically Signed   By: Lucienne Capers M.D.   On: 01/09/2013 02:48   Dg Chest Port 1 View  01/18/2013   CLINICAL DATA:  Weakness, leukocytosis, abdominal pain, history chronic pancreatitis, hypertension, coronary disease, smoking, GERD  EXAM: PORTABLE CHEST - 1 VIEW  COMPARISON:  Portable exam 1739 hr compared to 01/17/2013  FINDINGS: Left subclavian Port-A-Cath stable with tip projecting over SVC.  Normal heart size, mediastinal contours, and pulmonary vascularity.  Lungs clear.  No pleural effusion or pneumothorax.  Bones unremarkable.  IMPRESSION: No acute abnormalities.   Electronically Signed   By: Lavonia Dana M.D.   On: 01/18/2013 18:09   Dg Abd Acute W/chest  01/17/2013   CLINICAL DATA:  Hematemesis.  EXAM: ACUTE ABDOMEN SERIES (ABDOMEN 2 VIEW & CHEST 1 VIEW)   COMPARISON:  Abdominal CT 01/09/2013  FINDINGS: Left-sided porta catheter in good position. No cardiomegaly. Clear lungs. No effusion or pneumothorax. Unremarkable appearance of gastrojejunostomy tube. Splenic calcifications consistent with granulomatous disease. No evidence of bowel obstruction or perforation.  IMPRESSION: 1. Nonobstructive bowel gas pattern. 2. Gastrojejunostomy tube in good position. 3. Clear chest.   Electronically Signed   By: Jorje Guild M.D.   On: 01/17/2013 02:24    Microbiology: No results found for this or any previous visit (from the past 240 hour(s)).   Labs: Basic Metabolic Panel:  Recent Labs Lab 01/23/13 1657 01/24/13 0501 01/25/13 0443 01/26/13 0447 01/27/13 0503  NA 141 142 143 143 143  K 3.7 3.4* 4.4 4.4 3.9  CL 102 107 111 110 108  CO2 26 24 24 25 25   GLUCOSE 204* 84 147* 121* 76  BUN 12 8 5* 5* 4*  CREATININE 0.65 0.64 0.62 0.64 0.64  CALCIUM 9.5 8.1* 8.3* 8.3* 8.6   Liver Function Tests:  Recent Labs Lab 01/21/13 0657 01/23/13 1657 01/24/13 0501  AST 20 24 19   ALT 18 27 20   ALKPHOS 110 134* 91  BILITOT 0.2* 0.2* 0.2*  PROT 7.1 8.8* 6.6  ALBUMIN 2.6* 3.3* 2.5*    Recent Labs Lab 01/23/13 1657 01/24/13 0501 01/25/13 0443 01/26/13 0447 01/27/13 0503  LIPASE 231* 219* 243* 145* 118*   No results found for this basename: AMMONIA,  in the last 168 hours CBC:  Recent Labs Lab 01/23/13 1657 01/24/13 0501  WBC 9.1 8.1  HGB 14.9 11.4*  HCT 41.8 32.5*  MCV 91.1 92.9  PLT 259 233   Cardiac Enzymes: No results found for this basename: CKTOTAL, CKMB, CKMBINDEX, TROPONINI,  in the last 168 hours BNP: BNP (last 3 results) No results found for this basename: PROBNP,  in the last 8760 hours CBG:  Recent Labs Lab 01/26/13 1155 01/26/13 1631 01/26/13 2050 01/27/13 0824 01/27/13 1130  GLUCAP 102* 107* 112* 130* 121*       Signed:  MEMON,JEHANZEB  [facility] 01/27/2013, 12:52 PM

## 2013-01-27 NOTE — Clinical Social Work Note (Signed)
CSW met with pt again today after 2 facilities assessed him and feel they can take him as a resident. However, facility this morning reports that pt states he will not give up his check as he has a car and insurance. CSW asked pt about this because he was made aware yesterday that this was the case. He states he just thought about it more and can't do it. Pt plans to return to his ex-wife's home until she can assist him in finding an apartment. Pt is aware to contact facilities on his own from home if he changes his mind. CSW will sign off, but can be reconsulted if needed.  Benay Pike, [place]

## 2013-01-27 NOTE — Discharge Planning (Signed)
Pt stated he was ready to go home and  Pain was under control.  Pt given pain meds just before he left hospital.  Pt's port deaccessed and Feeding tube stopped..  Pt given DC papers and told of FU appointments with family in room.  Pt also given scripts.  Pt refused wheelchair and said he'd just walk out the door.

## 2013-01-31 NOTE — Progress Notes (Signed)
UR chart review completed.  

## 2013-03-13 ENCOUNTER — Encounter (HOSPITAL_COMMUNITY): Payer: Self-pay | Admitting: Emergency Medicine

## 2013-03-13 ENCOUNTER — Emergency Department (HOSPITAL_COMMUNITY): Payer: Medicaid Other

## 2013-03-13 ENCOUNTER — Observation Stay (HOSPITAL_COMMUNITY)
Admission: EM | Admit: 2013-03-13 | Discharge: 2013-03-13 | Disposition: A | Discharge status: Other Acute Inpatient Hospital | Payer: Medicaid Other | Attending: Family Medicine | Admitting: Family Medicine

## 2013-03-13 DIAGNOSIS — R64 Cachexia: Secondary | ICD-10-CM | POA: Insufficient documentation

## 2013-03-13 DIAGNOSIS — R569 Unspecified convulsions: Secondary | ICD-10-CM | POA: Insufficient documentation

## 2013-03-13 DIAGNOSIS — K219 Gastro-esophageal reflux disease without esophagitis: Secondary | ICD-10-CM | POA: Insufficient documentation

## 2013-03-13 DIAGNOSIS — K920 Hematemesis: Secondary | ICD-10-CM | POA: Insufficient documentation

## 2013-03-13 DIAGNOSIS — K449 Diaphragmatic hernia without obstruction or gangrene: Secondary | ICD-10-CM | POA: Insufficient documentation

## 2013-03-13 DIAGNOSIS — M25519 Pain in unspecified shoulder: Secondary | ICD-10-CM | POA: Insufficient documentation

## 2013-03-13 DIAGNOSIS — Z9861 Coronary angioplasty status: Secondary | ICD-10-CM | POA: Insufficient documentation

## 2013-03-13 DIAGNOSIS — F411 Generalized anxiety disorder: Secondary | ICD-10-CM | POA: Insufficient documentation

## 2013-03-13 DIAGNOSIS — K863 Pseudocyst of pancreas: Secondary | ICD-10-CM | POA: Diagnosis present

## 2013-03-13 DIAGNOSIS — Z934 Other artificial openings of gastrointestinal tract status: Secondary | ICD-10-CM | POA: Insufficient documentation

## 2013-03-13 DIAGNOSIS — R1013 Epigastric pain: Principal | ICD-10-CM | POA: Insufficient documentation

## 2013-03-13 DIAGNOSIS — I2510 Atherosclerotic heart disease of native coronary artery without angina pectoris: Secondary | ICD-10-CM | POA: Insufficient documentation

## 2013-03-13 DIAGNOSIS — K861 Other chronic pancreatitis: Secondary | ICD-10-CM | POA: Insufficient documentation

## 2013-03-13 DIAGNOSIS — G894 Chronic pain syndrome: Secondary | ICD-10-CM | POA: Insufficient documentation

## 2013-03-13 DIAGNOSIS — K859 Acute pancreatitis without necrosis or infection, unspecified: Secondary | ICD-10-CM | POA: Diagnosis present

## 2013-03-13 DIAGNOSIS — W010XXA Fall on same level from slipping, tripping and stumbling without subsequent striking against object, initial encounter: Secondary | ICD-10-CM | POA: Insufficient documentation

## 2013-03-13 DIAGNOSIS — G40909 Epilepsy, unspecified, not intractable, without status epilepticus: Secondary | ICD-10-CM | POA: Diagnosis present

## 2013-03-13 DIAGNOSIS — J4489 Other specified chronic obstructive pulmonary disease: Secondary | ICD-10-CM | POA: Insufficient documentation

## 2013-03-13 DIAGNOSIS — J449 Chronic obstructive pulmonary disease, unspecified: Secondary | ICD-10-CM | POA: Insufficient documentation

## 2013-03-13 DIAGNOSIS — G4700 Insomnia, unspecified: Secondary | ICD-10-CM | POA: Insufficient documentation

## 2013-03-13 DIAGNOSIS — K922 Gastrointestinal hemorrhage, unspecified: Secondary | ICD-10-CM | POA: Diagnosis present

## 2013-03-13 DIAGNOSIS — I10 Essential (primary) hypertension: Secondary | ICD-10-CM | POA: Insufficient documentation

## 2013-03-13 LAB — URINALYSIS, ROUTINE W REFLEX MICROSCOPIC
BILIRUBIN URINE: NEGATIVE
Glucose, UA: NEGATIVE mg/dL
HGB URINE DIPSTICK: NEGATIVE
Ketones, ur: NEGATIVE mg/dL
Leukocytes, UA: NEGATIVE
NITRITE: NEGATIVE
PH: 5 (ref 5.0–8.0)
SPECIFIC GRAVITY, URINE: 1.025 (ref 1.005–1.030)
Urobilinogen, UA: 0.2 mg/dL (ref 0.0–1.0)

## 2013-03-13 LAB — CBC WITH DIFFERENTIAL/PLATELET
BASOS ABS: 0.1 10*3/uL (ref 0.0–0.1)
Basophils Relative: 0 % (ref 0–1)
Eosinophils Absolute: 0.1 10*3/uL (ref 0.0–0.7)
Eosinophils Relative: 0 % (ref 0–5)
HCT: 32.5 % — ABNORMAL LOW (ref 39.0–52.0)
Hemoglobin: 11.1 g/dL — ABNORMAL LOW (ref 13.0–17.0)
LYMPHS ABS: 1.3 10*3/uL (ref 0.7–4.0)
Lymphocytes Relative: 6 % — ABNORMAL LOW (ref 12–46)
MCH: 32.3 pg (ref 26.0–34.0)
MCHC: 34.2 g/dL (ref 30.0–36.0)
MCV: 94.5 fL (ref 78.0–100.0)
Monocytes Absolute: 1.1 10*3/uL — ABNORMAL HIGH (ref 0.1–1.0)
Monocytes Relative: 5 % (ref 3–12)
NEUTROS ABS: 21.1 10*3/uL — AB (ref 1.7–7.7)
NEUTROS PCT: 89 % — AB (ref 43–77)
Platelets: 394 10*3/uL (ref 150–400)
RBC: 3.44 MIL/uL — AB (ref 4.22–5.81)
RDW: 13.8 % (ref 11.5–15.5)
WBC Morphology: INCREASED
WBC: 23.6 10*3/uL — AB (ref 4.0–10.5)

## 2013-03-13 LAB — COMPREHENSIVE METABOLIC PANEL
ALBUMIN: 2.6 g/dL — AB (ref 3.5–5.2)
ALK PHOS: 164 U/L — AB (ref 39–117)
ALT: 54 U/L — ABNORMAL HIGH (ref 0–53)
AST: 45 U/L — AB (ref 0–37)
BILIRUBIN TOTAL: 0.4 mg/dL (ref 0.3–1.2)
BUN: 27 mg/dL — AB (ref 6–23)
CHLORIDE: 100 meq/L (ref 96–112)
CO2: 24 mEq/L (ref 19–32)
Calcium: 9.3 mg/dL (ref 8.4–10.5)
Creatinine, Ser: 0.61 mg/dL (ref 0.50–1.35)
GFR calc Af Amer: >90 mL/min (ref >90)
GFR calc non Af Amer: >90 mL/min (ref >90)
Glucose, Bld: 180 mg/dL — ABNORMAL HIGH (ref 70–99)
POTASSIUM: 4.2 meq/L (ref 3.7–5.3)
SODIUM: 137 meq/L (ref 137–147)
Total Protein: 9.1 g/dL — ABNORMAL HIGH (ref 6.0–8.3)

## 2013-03-13 LAB — URINE MICROSCOPIC-ADD ON

## 2013-03-13 LAB — PHENYTOIN LEVEL, TOTAL: Phenytoin Lvl: <2.5 ug/mL — ABNORMAL LOW (ref 10.0–20.0)

## 2013-03-13 LAB — POC OCCULT BLOOD, ED: Fecal Occult Bld: POSITIVE — AB

## 2013-03-13 LAB — LIPASE, BLOOD: Lipase: 59 U/L (ref 11–59)

## 2013-03-13 MED ORDER — MORPHINE SULFATE 4 MG/ML IJ SOLN
4.0000 mg | Freq: Once | INTRAMUSCULAR | Status: AC
  Administered 2013-03-13: 4 mg via INTRAVENOUS
  Filled 2013-03-13: qty 1

## 2013-03-13 MED ORDER — HYDROMORPHONE HCL PF 1 MG/ML IJ SOLN
1.0000 mg | Freq: Once | INTRAMUSCULAR | Status: AC
  Administered 2013-03-13: 1 mg via INTRAVENOUS
  Filled 2013-03-13: qty 1

## 2013-03-13 MED ORDER — IOHEXOL 300 MG/ML  SOLN
100.0000 mL | Freq: Once | INTRAMUSCULAR | Status: AC | PRN
  Administered 2013-03-13: 100 mL via INTRAVENOUS

## 2013-03-13 MED ORDER — ONDANSETRON HCL 4 MG/2ML IJ SOLN
4.0000 mg | Freq: Once | INTRAMUSCULAR | Status: AC
Start: 2013-03-13 — End: 2013-03-13
  Administered 2013-03-13: 4 mg via INTRAVENOUS

## 2013-03-13 MED ORDER — ONDANSETRON HCL 4 MG/2ML IJ SOLN
4.0000 mg | Freq: Once | INTRAMUSCULAR | Status: AC
Start: 2013-03-13 — End: 2013-03-13
  Administered 2013-03-13: 4 mg via INTRAMUSCULAR
  Filled 2013-03-13: qty 2

## 2013-03-13 MED ORDER — ONDANSETRON HCL 4 MG/2ML IJ SOLN
4.0000 mg | Freq: Once | INTRAMUSCULAR | Status: DC
  Filled 2013-03-13: qty 2

## 2013-03-13 MED ORDER — PANTOPRAZOLE SODIUM 40 MG IV SOLR
40.0000 mg | Freq: Once | INTRAVENOUS | Status: AC
  Administered 2013-03-13: 40 mg via INTRAVENOUS
  Filled 2013-03-13: qty 40

## 2013-03-13 NOTE — ED Provider Notes (Signed)
Medical screening examination/treatment/procedure(s) were conducted as a shared visit with non-physician practitioner(s) and myself.  I personally evaluated the patient during the encounter.  D/w Pt, [facility] at [facility], IR at [facility], [place] at [place], Pt was to be admitted at [facility] but now being transferred to [facility] for possible IR tomorrow and GI consult. [facility] arranging transfer to [place] service. Carmi d/w GI at [facility] rec transfer to [place] not available at [facility] GI. Pt aware, [facility] paged. [phone]  Accepted at [facility]; can order CTA abdomen there. [phone] The patient appears reasonably stabilized for transfer considering the current resources, flow, and capabilities available in the ED at this time, and I doubt any other [facility] requiring further screening and/or treatment in the ED prior to transfer. Patient agrees to transfer.  Babette Relic, MD 03/16/13 2039

## 2013-03-13 NOTE — ED Provider Notes (Signed)
CSN: [id number]     Arrival date & time 03/13/13  0908 History   First MD Initiated Contact with Patient 03/13/13 [phone]     Chief Complaint  Patient presents with  . Abdominal Pain  . Shoulder Pain     (Consider location/radiation/quality/duration/timing/severity/associated sxs/prior Treatment) HPI Comments: Marcus Brooks is a 53 y.o. Male presenting with left shoulder pain and abdominal pain which started 2 days ago after slipping on ice, landing on his left shoulder.  He describes sharp shoulder pain that is worsened with movement or palpation.  He denies weakness or numbness in his left arm or hand. He has a history of chronic pancreatitis secondary to etoh use (denies current etoh) and his current abdominal pain is consistent with previous episodes of worsening pancreatitis.  He also reports develop nausea with emesis this morning, significant for hematemesis.  He estimates vomiting less than one half cup blood mixed with water prior to arrival.  He denies diarrhea or rectal bleeding.  He has had no fevers or chills, denies weakness or dizziness. He has a jejunostomy feeding tube for which he obtained his main nutrition, but also maintains by mouth intake.  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 He also has a history of seizure disorder, denies recent seizures.     The history is provided by the patient.    Past Medical History  Diagnosis Date  . Pancreatitis chronic 08/04/2010  . GERD (gastroesophageal reflux disease)   . Insomnia 08/04/2010  . Seizure 08/04/2010  . Chronic pain syndrome 08/04/2010  . Unspecified essential hypertension 08/04/2010  . Chronic pancreatitis 08/04/2010  . Anxiety states 08/04/2010  . Chronic airway obstruction, not elsewhere classified 08/04/2010  . Tobacco use disorder 08/04/2010  . Other abnormal glucose 08/04/2010  . Coronary artery disease   . Splenic  vein thrombosis     Chronic.  Marland Kitchen Hiatal hernia     EGD 01/18/13   Past Surgical History  Procedure Laterality Date  . Jejunostomy feeding tube  07/13/2010  . Pancreatic pseudocyst drainage  07/13/2010  . Eus  03/26/2009    [facility]  . Portacath placement  11/28/2008    [facility]  . Gastrostomy-jejeunostomy tube change/placement  12/11/2008    [facility]  . I&d extremity Left 09/12/2012    Procedure: IRRIGATION AND DEBRIDEMENT LEFT FIFTH FINGER;  Surgeon: Roseanne Kaufman, MD;  Location: [place];  Service: Orthopedics;  Laterality: Left;  . Open reduction internal fixation (orif) metacarpal Left 09/12/2012    Procedure: OPEN REDUCTION INTERNAL FIXATION LEFT FIFTH FINGER;  Surgeon: Roseanne Kaufman, MD;  Location: [place];  Service: Orthopedics;  Laterality: Left;  . Nerve and tendon repair Left 09/12/2012    Procedure: NERVE AND TENDON REPAIR LEFT FIFTH FINGER;  Surgeon: Roseanne Kaufman, MD;  Location: [place];  Service: Orthopedics;  Laterality: Left;  . Coronary angioplasty with stent placement    . Esophagogastroduodenoscopy N/A 01/18/2013    Procedure: ESOPHAGOGASTRODUODENOSCOPY (EGD);  Surgeon: Rogene Houston, MD;  [facility];  Service: Endoscopy;  Laterality: N/A;   History reviewed. No pertinent family history. History  Substance Use Topics  . Smoking status: Current Every Day Smoker -- 1.00 packs/day for 20 years    Types: Cigarettes  . Smokeless tobacco: Not on file     Comment: 1 pack a day since age 39  . Alcohol Use: No     Comment: No etoh  since 5-6 yrs    Review of Systems  Constitutional: Negative for fever and chills.  HENT: Negative for congestion and sore throat.   Eyes: Negative.   Respiratory: Negative for chest tightness and shortness of breath.   Cardiovascular: Negative for chest pain.  Gastrointestinal: Positive for nausea, vomiting and abdominal pain. Negative for diarrhea, constipation, blood in stool and abdominal distention.  Genitourinary:  Negative.   Musculoskeletal: Positive for arthralgias. Negative for joint swelling and neck pain.  Skin: Negative.  Negative for rash and wound.  Neurological: Negative for dizziness, weakness, light-headedness, numbness and headaches.  Psychiatric/Behavioral: Negative.       Allergies  Review of patient's allergies indicates no active allergies.  Home Medications   Current Outpatient Rx  Name  Route  Sig  Dispense  Refill  . albuterol (PROVENTIL HFA;VENTOLIN HFA) 108 (90 BASE) MCG/ACT inhaler   Inhalation   Inhale 2 puffs into the lungs every 6 (six) hours as needed for wheezing or shortness of breath.          Marland Kitchen amitriptyline (ELAVIL) 25 MG tablet   Oral   Take 25 mg by mouth at bedtime.           Marland Kitchen amLODipine (NORVASC) 10 MG tablet   Oral   Take 10 mg by mouth daily.           . cholecalciferol (VITAMIN D) 1000 UNITS tablet   Oral   Take 1,000 Units by mouth daily.           . diazepam (VALIUM) 10 MG tablet   Oral   Take 10 mg by mouth 3 (three) times daily.          Marland Kitchen gabapentin (NEURONTIN) 300 MG capsule   Oral   Take 1,200 mg by mouth 2 (two) times daily.         . hydrochlorothiazide (HYDRODIURIL) 25 MG tablet   Oral   Take 25 mg by mouth daily.         Marland Kitchen HYDROcodone-acetaminophen (NORCO/VICODIN) 5-325 MG per tablet   Oral   Take 1 tablet by mouth every 4 (four) hours as needed for moderate pain.   20 tablet   0   . loperamide (IMODIUM) 2 MG capsule   Oral   Take 8 mg by mouth daily. *Prescribed one tablet 4 times daily as needed*         . loratadine (CLARITIN) 10 MG tablet   Oral   Take 10 mg by mouth daily.           Marland Kitchen omeprazole (PRILOSEC) 40 MG capsule   Oral   Take 40 mg by mouth 2 (two) times daily.         . phenytoin (DILANTIN) 100 MG ER capsule   Oral   Take 300 mg by mouth every evening.         . potassium chloride (KLOR-CON) 10 MEQ CR tablet   Oral   Take 10 mEq by mouth daily.           . promethazine  (PHENERGAN) 25 MG tablet   Oral   Take 25 mg by mouth every 6 (six) hours as needed for nausea or vomiting.         . SUMAtriptan (IMITREX) 100 MG tablet   Oral   Take 100 mg by mouth as needed for migraine or headache. May repeat in 2 hours if headache persists or recurs.         Marland Kitchen  traZODone (DESYREL) 100 MG tablet   Oral   Take 200 mg by mouth at bedtime.          . vitamin B-12 (CYANOCOBALAMIN) 1000 MCG tablet   Oral   Take 1,000 mcg by mouth daily.          BP 146/66  Pulse 93  Temp(Src) 97.6 F (36.4 C) (Oral)  Resp 17  SpO2 100% Physical Exam  Nursing note and vitals reviewed. Constitutional:  Cachectic individual who appears uncomfortable, no distress.  HENT:  Head: Normocephalic and atraumatic.  Mouth/Throat: Mucous membranes are dry.  Eyes: Conjunctivae are normal.  Neck: Normal range of motion.  Cardiovascular: Normal rate, regular rhythm, normal heart sounds and intact distal pulses.   Pulmonary/Chest: Effort normal and breath sounds normal. He has no decreased breath sounds. He has no wheezes. He has no rhonchi. He has no rales.  Port-A-Cath in place in left upper chest wall.  No edema or tenderness.  No visible trauma from his fall.  Abdominal: Soft. Bowel sounds are normal. There is tenderness in the epigastric area and left upper quadrant. There is no guarding.  J-tube in place, and intact, no surrounding erythema or drainage.  Musculoskeletal: Normal range of motion.  Neurological: He is alert.  Skin: Skin is warm and dry.  Psychiatric: He has a normal mood and affect.    ED Course  Procedures (including critical care time) Labs Review Labs Reviewed  CBC WITH DIFFERENTIAL - Abnormal; Notable for the following:    WBC 23.6 (*)    RBC 3.44 (*)    Hemoglobin 11.1 (*)    HCT 32.5 (*)    Neutrophils Relative % 89 (*)    Neutro Abs 21.1 (*)    Lymphocytes Relative 6 (*)    Monocytes Absolute 1.1 (*)    All other components within normal limits   COMPREHENSIVE METABOLIC PANEL - Abnormal; Notable for the following:    Glucose, Bld 180 (*)    BUN 27 (*)    Total Protein 9.1 (*)    Albumin 2.6 (*)    AST 45 (*)    ALT 54 (*)    Alkaline Phosphatase 164 (*)    All other components within normal limits  URINALYSIS, ROUTINE W REFLEX MICROSCOPIC - Abnormal; Notable for the following:    Protein, ur TRACE (*)    All other components within normal limits  PHENYTOIN LEVEL, TOTAL - Abnormal; Notable for the following:    Phenytoin Lvl <2.5 (*)    All other components within normal limits  POC OCCULT BLOOD, ED - Abnormal; Notable for the following:    Fecal Occult Bld POSITIVE (*)    All other components within normal limits  LIPASE, BLOOD  URINE MICROSCOPIC-ADD ON   Imaging Review Ct Abdomen Pelvis Wo Contrast  03/13/2013   CLINICAL DATA:  Epigastric pain. History pancreatitis and splenic vein thrombosis.  EXAM: CT ABDOMEN AND PELVIS WITHOUT CONTRAST  TECHNIQUE: Multidetector CT imaging of the abdomen and pelvis was performed following the standard protocol without intravenous contrast.  COMPARISON:  01/23/2013  FINDINGS: Lung bases are clear.  No evidence for free air.  Patient has gastrostomy with a feeding tube extending through the gastrostomy tube. This feeding tube extends into the small bowel. The stomach is distended with contrast.  Unenhanced CT was performed per clinician order. Lack of IV contrast limits sensitivity and specificity, especially for evaluation of abdominal/pelvic solid viscera.  There are scattered calcifications throughout the liver and  spleen consistent with old granulomatous disease. There is a new heterogeneous structure involving the lateral segment of the left hepatic lobe that appears to extend beyond the hepatic capsule. This structure roughly measures 9.2 x 4.3 cm on sequence 2, image 14. This structure has areas of low and high density. The previously described peripancreatic collection is more dense and  enlarged. The peripancreatic structure roughly measures 5.0 x 3.1 cm on sequence 2, image 24 previously measured 3.2 x 2.5 cm. There are multiple calcifications throughout the pancreas consistent with old pancreatitis. Soft tissue structure near the pancreatic head on sequence 2, image 33 measures 1.6 cm in the short axis and could represent an enlarged lymph node but difficult to evaluate on this non-contrast examination.  Stable appearance of the adrenal glands. Stable mild fullness in the left adrenal gland. Mild left perinephric edema. No evidence for hydronephrosis. Fluid in the urinary bladder. No gross abnormality to the prostate or seminal vesicles.  No gross abnormality to the small or large bowel. Again noted are prominent periaortic structures. These could represent a combination of large blood vessels and lymph nodes. Again noted are prominent varices along the left anterior abdominal cavity. There is no significant free fluid in the pelvis. No acute bone abnormality.  IMPRESSION: New heterogeneous lesion involving the left hepatic lobe that appears to extend beyond the liver capsule. Based on the history of pancreatitis and previous peripancreatic fluid collections, findings are suggestive for a pseudocyst formation with hepatic involvement. This ill-defined collection has areas of high attenuation and this could represent blood products. In addition, the peripancreatic collection near the pancreatic body and tail has enlarged since the previous examination. These collections could be better characterized with a post contrast examination or MRI.  Enlarged retroperitoneal structures may represent a combination of lymph nodes and enlarged blood vessels.  These results were called by telephone at the time of interpretation on 03/13/2013 at 1:18 PM to Dr. Evalee Jefferson , who verbally acknowledged these results.   Electronically Signed   By: Markus Daft M.D.   On: 03/13/2013 13:24   Dg Shoulder Left  03/13/2013    CLINICAL DATA:  Slipped and fell 5 days ago with left shoulder pain  EXAM: LEFT SHOULDER - 2+ VIEW  COMPARISON:  None.  FINDINGS: There is no evidence of fracture or dislocation. There is no evidence of arthropathy or other focal bone abnormality. Soft tissues are unremarkable.  IMPRESSION: Negative.   Electronically Signed   By: Skipper Cliche M.D.   On: 03/13/2013 11:10     EKG Interpretation None      MDM   Final diagnoses:  GI bleed  Pancreatic pseudocyst    Patients labs and/or radiological studies were viewed and considered during the medical decision making and disposition process. Pt was also seen by Dr Winfred Leeds during this visit. Admission required for management of this presumed enlarged pseudocyst.  Discussed with Dr. Sherral Hammers who will admit patient.  Temp admit orders placed.    Pt had a Ct scan with IV contrast ordered,  CT tech told pt's RN cannot push IV through port so the CT order was changed by the CT tech to oral contrast only.  Pt refused peripheral IV so CT was completed with oral contrast only.  I was not aware of this change until after the test was performed.  Per radiologist,  Port can be used, just can't use the power infuser,  Needs to be pushed manually.  He also recommended Ct  findings better evaluated with IV contrast.  Will repeat this study with the IV contrast.  Pt agreed  now to peripheral IV, so second CT completed using a peripheral line.      Evalee Jefferson, PA-C 03/13/13 1601

## 2013-03-13 NOTE — ED Notes (Signed)
Pt comes from home via EMS with c/o generalized abdominal pain as well as left shoulder pain x2 days. Pt states "I slipped and fell onto my shoulder and the pain goes all the way to my stomach". Pt has full ROM in extremity. Pt also reports nausea and vomiting that began this morning.

## 2013-03-13 NOTE — ED Provider Notes (Signed)
Patient reports having slipped and fallen on the ice 5 days ago injuring his left shoulder complains of left shoulder pain worse with movement. No treatment prior to coming here. He also complains of epigastric pain onset this morning. He vomited 3 times stating "blood mixed with water. States he vomited less than half a cup of blood. Pain feels like pancreatitis he's had in the past. He reports last drink alcohol 6 months ago. On exam chronically ill-appearing alert Glasgow Coma Score 15 HEENT exam ossified atraumatic neck supple trachea midline lungs clear auscultation. Heart regular rate and rhythm abdomen gastrostomy tube in place. Nondistended normal active bowel sounds tender at epigastrium no guarding rigidity or rebound. Left upper extremity no deformity no swelling pain with active motion. Full range of motion neurovascular intact. All other extremities no contusion abrasion or tenderness neurovascularly intact. Chest nontender. Port-A-Cath in place. Not red or tender at Port-A-Cath site  Orlie Dakin, MD 03/13/13 1004

## 2013-03-13 NOTE — ED Notes (Signed)
Pt back from radiology 

## 2013-03-14 NOTE — ED Provider Notes (Signed)
Medical screening examination/treatment/procedure(s) were conducted as a shared visit with non-physician practitioner(s) and myself.  I personally evaluated the patient during the encounter.   EKG Interpretation None       Orlie Dakin, MD 03/14/13 0700

## 2014-12-23 ENCOUNTER — Encounter (HOSPITAL_COMMUNITY): Payer: Self-pay

## 2014-12-23 ENCOUNTER — Emergency Department (HOSPITAL_COMMUNITY)
Admission: EM | Admit: 2014-12-23 | Discharge: 2014-12-24 | Disposition: A | Discharge status: Home / Self Care | Payer: Medicaid Other | Attending: Emergency Medicine | Admitting: Emergency Medicine

## 2014-12-23 DIAGNOSIS — E876 Hypokalemia: Secondary | ICD-10-CM | POA: Diagnosis not present

## 2014-12-23 DIAGNOSIS — Z9889 Other specified postprocedural states: Secondary | ICD-10-CM | POA: Diagnosis not present

## 2014-12-23 DIAGNOSIS — F419 Anxiety disorder, unspecified: Secondary | ICD-10-CM | POA: Diagnosis not present

## 2014-12-23 DIAGNOSIS — I2510 Atherosclerotic heart disease of native coronary artery without angina pectoris: Secondary | ICD-10-CM | POA: Diagnosis not present

## 2014-12-23 DIAGNOSIS — Z86718 Personal history of other venous thrombosis and embolism: Secondary | ICD-10-CM | POA: Insufficient documentation

## 2014-12-23 DIAGNOSIS — K219 Gastro-esophageal reflux disease without esophagitis: Secondary | ICD-10-CM | POA: Diagnosis not present

## 2014-12-23 DIAGNOSIS — Z931 Gastrostomy status: Secondary | ICD-10-CM | POA: Diagnosis not present

## 2014-12-23 DIAGNOSIS — Z8709 Personal history of other diseases of the respiratory system: Secondary | ICD-10-CM | POA: Diagnosis not present

## 2014-12-23 DIAGNOSIS — G4700 Insomnia, unspecified: Secondary | ICD-10-CM | POA: Diagnosis not present

## 2014-12-23 DIAGNOSIS — F17210 Nicotine dependence, cigarettes, uncomplicated: Secondary | ICD-10-CM | POA: Insufficient documentation

## 2014-12-23 DIAGNOSIS — R6883 Chills (without fever): Secondary | ICD-10-CM | POA: Diagnosis not present

## 2014-12-23 DIAGNOSIS — G8929 Other chronic pain: Secondary | ICD-10-CM | POA: Diagnosis not present

## 2014-12-23 DIAGNOSIS — K860 Alcohol-induced chronic pancreatitis: Secondary | ICD-10-CM | POA: Diagnosis not present

## 2014-12-23 DIAGNOSIS — R112 Nausea with vomiting, unspecified: Secondary | ICD-10-CM | POA: Diagnosis present

## 2014-12-23 DIAGNOSIS — I10 Essential (primary) hypertension: Secondary | ICD-10-CM | POA: Diagnosis not present

## 2014-12-23 DIAGNOSIS — Z79899 Other long term (current) drug therapy: Secondary | ICD-10-CM | POA: Diagnosis not present

## 2014-12-23 DIAGNOSIS — R1012 Left upper quadrant pain: Secondary | ICD-10-CM

## 2014-12-23 LAB — URINALYSIS, ROUTINE W REFLEX MICROSCOPIC
Bilirubin Urine: NEGATIVE
Glucose, UA: 100 mg/dL — AB
Hgb urine dipstick: NEGATIVE
Ketones, ur: NEGATIVE mg/dL
Leukocytes, UA: NEGATIVE
NITRITE: NEGATIVE
Protein, ur: 30 mg/dL — AB
SPECIFIC GRAVITY, URINE: 1.016 (ref 1.005–1.030)
pH: 8 (ref 5.0–8.0)

## 2014-12-23 LAB — CBC
HCT: 40.1 % (ref 39.0–52.0)
HEMOGLOBIN: 13.7 g/dL (ref 13.0–17.0)
MCH: 32.2 pg (ref 26.0–34.0)
MCHC: 34.2 g/dL (ref 30.0–36.0)
MCV: 94.4 fL (ref 78.0–100.0)
Platelets: 149 10*3/uL — ABNORMAL LOW (ref 150–400)
RBC: 4.25 MIL/uL (ref 4.22–5.81)
RDW: 14 % (ref 11.5–15.5)
WBC: 11.1 10*3/uL — ABNORMAL HIGH (ref 4.0–10.5)

## 2014-12-23 LAB — URINE MICROSCOPIC-ADD ON: RBC / HPF: NONE SEEN RBC/hpf (ref 0–5)

## 2014-12-23 MED ORDER — SODIUM CHLORIDE 0.9 % IV BOLUS (SEPSIS)
1000.0000 mL | Freq: Once | INTRAVENOUS | Status: AC
  Administered 2014-12-23: 1000 mL via INTRAVENOUS

## 2014-12-23 MED ORDER — HYDROMORPHONE HCL 1 MG/ML IJ SOLN
1.0000 mg | Freq: Once | INTRAMUSCULAR | Status: AC
  Administered 2014-12-24: 1 mg via INTRAVENOUS
  Filled 2014-12-23: qty 1

## 2014-12-23 MED ORDER — HYDROMORPHONE HCL 1 MG/ML IJ SOLN
1.0000 mg | Freq: Once | INTRAMUSCULAR | Status: AC
  Administered 2014-12-23: 1 mg via INTRAVENOUS
  Filled 2014-12-23: qty 1

## 2014-12-23 MED ORDER — ONDANSETRON HCL 4 MG/2ML IJ SOLN
4.0000 mg | Freq: Once | INTRAMUSCULAR | Status: AC | PRN
  Administered 2014-12-23: 4 mg via INTRAVENOUS
  Filled 2014-12-23: qty 2

## 2014-12-23 MED ORDER — SODIUM CHLORIDE 0.9 % IV BOLUS (SEPSIS): 1000.0000 mL | Freq: Once | INTRAVENOUS | Status: DC

## 2014-12-23 NOTE — ED Notes (Signed)
Onset yesterday LUQ abd pain and back pain, onset today vomiting x "1 gallon" after eating boiled egg this morning.

## 2014-12-23 NOTE — ED Provider Notes (Signed)
CSN: [id number]     Arrival date & time 12/23/14  2008 History   First MD Initiated Contact with Patient 12/23/14 2023     Chief Complaint  Patient presents with  . Abdominal Pain     (Consider location/radiation/quality/duration/timing/severity/associated sxs/prior Treatment) Patient is a 54 y.o. male presenting with abdominal pain. The history is provided by the patient.  Abdominal Pain Associated symptoms: chills, nausea and vomiting   Associated symptoms: no diarrhea, no fever and no shortness of breath    patient presents with nausea vomiting abdominal pain. History of chronic pancreatitis. States this feels the same. He has a PEG tube that he his feeds through but he also eats orally. No frank fevers but has been having some chills.  Past Medical History  Diagnosis Date  . Pancreatitis chronic 08/04/2010  . GERD (gastroesophageal reflux disease)   . Insomnia 08/04/2010  . Seizure ([place]) 08/04/2010  . Chronic pain syndrome 08/04/2010  . Unspecified essential hypertension 08/04/2010  . Chronic pancreatitis ([place]) 08/04/2010  . Anxiety states 08/04/2010  . Chronic airway obstruction, not elsewhere classified 08/04/2010  . Tobacco use disorder 08/04/2010  . Other abnormal glucose 08/04/2010  . Coronary artery disease   . Splenic vein thrombosis     Chronic.  Marland Kitchen Hiatal hernia     EGD 01/18/13   Past Surgical History  Procedure Laterality Date  . Jejunostomy feeding tube  07/13/2010  . Pancreatic pseudocyst drainage  07/13/2010  . Eus  03/26/2009    [facility]  . Portacath placement  11/28/2008    [facility]  . Gastrostomy-jejeunostomy tube change/placement  12/11/2008    [facility]  . I&d extremity Left 09/12/2012    Procedure: IRRIGATION AND DEBRIDEMENT LEFT FIFTH FINGER;  Surgeon: Roseanne Kaufman, MD;  Location: [place];  Service: Orthopedics;  Laterality: Left;  . Open reduction internal fixation (orif) metacarpal Left 09/12/2012    Procedure: OPEN REDUCTION INTERNAL  FIXATION LEFT FIFTH FINGER;  Surgeon: Roseanne Kaufman, MD;  Location: [place];  Service: Orthopedics;  Laterality: Left;  . Nerve and tendon repair Left 09/12/2012    Procedure: NERVE AND TENDON REPAIR LEFT FIFTH FINGER;  Surgeon: Roseanne Kaufman, MD;  Location: [place];  Service: Orthopedics;  Laterality: Left;  . Coronary angioplasty with stent placement    . Esophagogastroduodenoscopy N/A 01/18/2013    Procedure: ESOPHAGOGASTRODUODENOSCOPY (EGD);  Surgeon: Rogene Houston, MD;  [facility];  Service: Endoscopy;  Laterality: N/A;   History reviewed. No pertinent family history. Social History  Substance Use Topics  . Smoking status: Current Every Day Smoker -- 1.00 packs/day for 20 years    Types: Cigarettes  . Smokeless tobacco: None     Comment: 1 pack a day since age 48  . Alcohol Use: No     Comment: No etoh since 5-6 yrs    Review of Systems  Constitutional: Positive for chills and appetite change. Negative for fever.  HENT: Negative for dental problem.   Respiratory: Negative for shortness of breath.   Gastrointestinal: Positive for nausea, vomiting and abdominal pain. Negative for diarrhea.  Genitourinary: Negative for flank pain.  Musculoskeletal: Negative for back pain.  Skin: Negative for color change.  Neurological: Negative for weakness and light-headedness.  Psychiatric/Behavioral: Negative for behavioral problems.      Allergies  Review of patient's allergies indicates no known allergies.  Home Medications   Prior to Admission medications   Medication Sig Start Date End Date Taking? Authorizing Provider  albuterol (PROVENTIL HFA;VENTOLIN HFA)  108 (90 BASE) MCG/ACT inhaler Inhale 2 puffs into the lungs every 6 (six) hours as needed for wheezing or shortness of breath.    Yes Historical Provider, MD  amitriptyline (ELAVIL) 25 MG tablet Take 25 mg by mouth at bedtime.     Yes Historical Provider, MD  ferrous sulfate 325 (65 FE) MG tablet Take 325 mg by mouth  daily with breakfast.   Yes Historical Provider, MD  gabapentin (NEURONTIN) 300 MG capsule Take 300 mg by mouth 3 (three) times daily.    Yes Historical Provider, MD  lipase/protease/amylase (CREON) 12000 UNITS CPEP capsule Take 12,000 Units by mouth 3 (three) times daily before meals.   Yes Historical Provider, MD  loperamide (IMODIUM) 2 MG capsule Take 8 mg by mouth daily as needed for diarrhea or loose stools. *Prescribed one tablet 4 times daily as needed*   Yes Historical Provider, MD  pantoprazole (PROTONIX) 40 MG tablet Take 40 mg by mouth daily.   Yes Historical Provider, MD  phenytoin (DILANTIN) 100 MG ER capsule Take 300 mg by mouth every evening.   Yes Historical Provider, MD  promethazine (PHENERGAN) 25 MG tablet Take 25 mg by mouth every 6 (six) hours as needed for nausea or vomiting.   Yes Historical Provider, MD  SUMAtriptan (IMITREX) 100 MG tablet Take 100 mg by mouth as needed for migraine or headache. May repeat in 2 hours if headache persists or recurs.   Yes Historical Provider, MD  traZODone (DESYREL) 100 MG tablet Take 200 mg by mouth at bedtime.    Yes Historical Provider, MD   BP 187/85 mmHg  Pulse 85  Temp(Src) 98.4 F (36.9 C) (Oral)  Resp 20  Ht '5\' 6"'$  (1.676 m)  Wt 108 lb 8 oz (49.215 kg)  BMI 17.52 kg/m2  SpO2 98% Physical Exam  Constitutional: He appears well-developed.  Patient is cachectic and appears uncomfortable  HENT:  Head: Atraumatic.  Cardiovascular: Normal rate.   Pulmonary/Chest: Effort normal.  Abdominal: Soft.  Left-sided abdominal PEG tube. Left upper quadrant tenderness without mass rebound or guarding.  Neurological: He is alert.  Skin: Skin is warm.    ED Course  Procedures (including critical care time) Labs Review Labs Reviewed  CBC - Abnormal; Notable for the following:    WBC 11.1 (*)    Platelets 149 (*)    All other components within normal limits  URINALYSIS, ROUTINE W REFLEX MICROSCOPIC (NOT AT [facility]) - Abnormal; Notable for  the following:    APPearance TURBID (*)    Glucose, UA 100 (*)    Protein, ur 30 (*)    All other components within normal limits  URINE MICROSCOPIC-ADD ON - Abnormal; Notable for the following:    Squamous Epithelial / LPF 0-5 (*)    Bacteria, UA MANY (*)    Casts HYALINE CASTS (*)    All other components within normal limits  COMPREHENSIVE METABOLIC PANEL  LIPASE, BLOOD  PHENYTOIN LEVEL, TOTAL    Imaging Review No results found. I have personally reviewed and evaluated these images and lab results as part of my medical decision-making.   EKG Interpretation None      MDM   Final diagnoses:  None    Patient with abdominal pain. History of same with pancreatitis. Lab work pending at this time has been difficult to get IV fluids in him and somewhat to get blood work. We will give fluids and pain medicines and evaluate labs will return. Care turned over to Dr Waverly Ferrari.  Davonna Belling, MD 12/24/14 [phone]

## 2014-12-24 LAB — COMPREHENSIVE METABOLIC PANEL WITH GFR
ALT: 11 U/L — ABNORMAL LOW (ref 17–63)
AST: 15 U/L (ref 15–41)
Albumin: 3.6 g/dL (ref 3.5–5.0)
Alkaline Phosphatase: 126 U/L (ref 38–126)
Anion gap: 10 (ref 5–15)
BUN: <5 mg/dL — ABNORMAL LOW (ref 6–20)
CO2: 29 mmol/L (ref 22–32)
Calcium: 8.7 mg/dL — ABNORMAL LOW (ref 8.9–10.3)
Chloride: 96 mmol/L — ABNORMAL LOW (ref 101–111)
Creatinine, Ser: 0.53 mg/dL — ABNORMAL LOW (ref 0.61–1.24)
GFR calc Af Amer: >60 mL/min (ref >60)
GFR calc non Af Amer: >60 mL/min (ref >60)
Glucose, Bld: 116 mg/dL — ABNORMAL HIGH (ref 65–99)
Potassium: 2.8 mmol/L — ABNORMAL LOW (ref 3.5–5.1)
Sodium: 135 mmol/L (ref 135–145)
Total Bilirubin: 0.6 mg/dL (ref 0.3–1.2)
Total Protein: 7.6 g/dL (ref 6.5–8.1)

## 2014-12-24 LAB — LIPASE, BLOOD: Lipase: 59 U/L — ABNORMAL HIGH (ref 11–51)

## 2014-12-24 MED ORDER — POTASSIUM CHLORIDE CRYS ER 20 MEQ PO TBCR: 40.0000 meq | EXTENDED_RELEASE_TABLET | Freq: Every day | ORAL | Status: DC

## 2014-12-24 MED ORDER — RANITIDINE HCL 150 MG PO TABS: 150.0000 mg | ORAL_TABLET | Freq: Two times a day (BID) | ORAL | Status: DC

## 2014-12-24 MED ORDER — HYDROCODONE-ACETAMINOPHEN 5-325 MG PO TABS: 2.0000 | ORAL_TABLET | ORAL | Status: DC | PRN

## 2014-12-24 MED ORDER — POTASSIUM CHLORIDE CRYS ER 20 MEQ PO TBCR
40.0000 meq | EXTENDED_RELEASE_TABLET | Freq: Once | ORAL | Status: AC
  Administered 2014-12-24: 40 meq via ORAL
  Filled 2014-12-24: qty 2

## 2014-12-24 NOTE — ED Provider Notes (Signed)
Patient signed out to me to follow-up on lab work. Patient was seen for abdominal pain. He has a history of chronic pancreatitis and is complaining of pain consistent with previous pancreas flares. He is feeling better after IV analgesia. His lab work is unremarkable other than hypokalemia. He was administered potassium in the ER and will continue potassium placement as an outpatient. Lipase 59, patient feeling better, does not require hospitalization.  Orpah Greek, MD 12/24/14 [phone]

## 2014-12-24 NOTE — Discharge Instructions (Signed)
Abdominal Pain, Adult Many things can cause abdominal pain. Usually, abdominal pain is not caused by a disease and will improve without treatment. It can often be observed and treated at home. Your health care provider will do a physical exam and possibly order blood tests and X-rays to help determine the seriousness of your pain. However, in many cases, more time must pass before a clear cause of the pain can be found. Before that point, your health care provider may not know if you need more testing or further treatment. HOME CARE INSTRUCTIONS Monitor your abdominal pain for any changes. The following actions may help to alleviate any discomfort you are experiencing:  Only take over-the-counter or prescription medicines as directed by your health care provider.  Do not take laxatives unless directed to do so by your health care provider.  Try a clear liquid diet (broth, tea, or water) as directed by your health care provider. Slowly move to a bland diet as tolerated. SEEK MEDICAL CARE IF:  You have unexplained abdominal pain.  You have abdominal pain associated with nausea or diarrhea.  You have pain when you urinate or have a bowel movement.  You experience abdominal pain that wakes you in the night.  You have abdominal pain that is worsened or improved by eating food.  You have abdominal pain that is worsened with eating fatty foods.  You have a fever. SEEK IMMEDIATE MEDICAL CARE IF:  Your pain does not go away within 2 hours.  You keep throwing up (vomiting).  Your pain is felt only in portions of the abdomen, such as the right side or the left lower portion of the abdomen.  You pass bloody or black tarry stools. MAKE SURE YOU:  Understand these instructions.  Will watch your condition.  Will get help right away if you are not doing well or get worse.   This information is not intended to replace advice given to you by your health care provider. Make sure you discuss  any questions you have with your health care provider.   Document Released: 10/08/2004 Document Revised: 09/19/2014 Document Reviewed: 09/07/2012 Elsevier Interactive Patient Education 2016 [employer].  Hypokalemia Hypokalemia means that the amount of potassium in the blood is lower than normal.Potassium is a chemical, called an electrolyte, that helps regulate the amount of fluid in the body. It also stimulates muscle contraction and helps nerves function properly.Most of the body's potassium is inside of cells, and only a very small amount is in the blood. Because the amount in the blood is so small, minor changes can be life-threatening. CAUSES  Antibiotics.  Diarrhea or vomiting.  Using laxatives too much, which can cause diarrhea.  Chronic kidney disease.  Water pills (diuretics).  Eating disorders (bulimia).  Low magnesium level.  Sweating a lot. SIGNS AND SYMPTOMS  Weakness.  Constipation.  Fatigue.  Muscle cramps.  Mental confusion.  Skipped heartbeats or irregular heartbeat (palpitations).  Tingling or numbness. DIAGNOSIS  Your health care provider can diagnose hypokalemia with blood tests. In addition to checking your potassium level, your health care provider may also check other lab tests. TREATMENT Hypokalemia can be treated with potassium supplements taken by mouth or adjustments in your current medicines. If your potassium level is very low, you may need to get potassium through a vein (IV) and be monitored in the hospital. A diet high in potassium is also helpful. Foods high in potassium are:  Nuts, such as peanuts and pistachios.  Seeds, such as  sunflower seeds and pumpkin seeds.  Peas, lentils, and lima beans.  Whole grain and bran cereals and breads.  Fresh fruit and vegetables, such as apricots, avocado, bananas, cantaloupe, kiwi, oranges, tomatoes, asparagus, and potatoes.  Orange and tomato juices.  Red meats.  Fruit yogurt. HOME  CARE INSTRUCTIONS  Take all medicines as prescribed by your health care provider.  Maintain a healthy diet by including nutritious food, such as fruits, vegetables, nuts, whole grains, and lean meats.  If you are taking a laxative, be sure to follow the directions on the label. SEEK MEDICAL CARE IF:  Your weakness gets worse.  You feel your heart pounding or racing.  You are vomiting or having diarrhea.  You are diabetic and having trouble keeping your blood glucose in the normal range. SEEK IMMEDIATE MEDICAL CARE IF:  You have chest pain, shortness of breath, or dizziness.  You are vomiting or having diarrhea for more than 2 days.  You faint. MAKE SURE YOU:   Understand these instructions.  Will watch your condition.  Will get help right away if you are not doing well or get worse.   This information is not intended to replace advice given to you by your health care provider. Make sure you discuss any questions you have with your health care provider.   Document Released: 12/29/2004 Document Revised: 01/19/2014 Document Reviewed: 07/01/2012 Elsevier Interactive Patient Education [employer].

## 2015-05-08 ENCOUNTER — Ambulatory Visit (INDEPENDENT_AMBULATORY_CARE_PROVIDER_SITE_OTHER): Payer: Medicaid Other | Admitting: Family Medicine

## 2015-05-08 ENCOUNTER — Encounter: Payer: Self-pay | Admitting: Family Medicine

## 2015-05-08 VITALS — BP 125/68 | HR 79 | Temp 97.6°F | Resp 16 | Ht 66.0 in | Wt 111.0 lb

## 2015-05-08 DIAGNOSIS — Z0000 Encounter for general adult medical examination without abnormal findings: Secondary | ICD-10-CM

## 2015-05-08 DIAGNOSIS — I10 Essential (primary) hypertension: Secondary | ICD-10-CM | POA: Diagnosis not present

## 2015-05-08 DIAGNOSIS — D649 Anemia, unspecified: Secondary | ICD-10-CM

## 2015-05-08 DIAGNOSIS — G40909 Epilepsy, unspecified, not intractable, without status epilepticus: Secondary | ICD-10-CM

## 2015-05-08 DIAGNOSIS — F17200 Nicotine dependence, unspecified, uncomplicated: Secondary | ICD-10-CM

## 2015-05-08 DIAGNOSIS — T85848S Pain due to other internal prosthetic devices, implants and grafts, sequela: Secondary | ICD-10-CM

## 2015-05-08 DIAGNOSIS — K860 Alcohol-induced chronic pancreatitis: Secondary | ICD-10-CM

## 2015-05-08 LAB — COMPLETE METABOLIC PANEL WITH GFR
ALBUMIN: 3.7 g/dL (ref 3.6–5.1)
ALK PHOS: 106 U/L (ref 40–115)
ALT: 10 U/L (ref 9–46)
AST: 16 U/L (ref 10–35)
BUN: 4 mg/dL — AB (ref 7–25)
CALCIUM: 8.9 mg/dL (ref 8.6–10.3)
CHLORIDE: 106 mmol/L (ref 98–110)
CO2: 22 mmol/L (ref 20–31)
CREATININE: 0.65 mg/dL — AB (ref 0.70–1.33)
GFR, Est Non African American: >89 mL/min (ref >=60)
Glucose, Bld: 94 mg/dL (ref 65–99)
POTASSIUM: 4.2 mmol/L (ref 3.5–5.3)
Sodium: 137 mmol/L (ref 135–146)
Total Bilirubin: 0.3 mg/dL (ref 0.2–1.2)
Total Protein: 7.4 g/dL (ref 6.1–8.1)

## 2015-05-08 LAB — CBC WITH DIFFERENTIAL/PLATELET
BASOS PCT: 1 %
Basophils Absolute: 97 cells/uL (ref 0–200)
EOS PCT: 4 %
Eosinophils Absolute: 388 cells/uL (ref 15–500)
HCT: 38.4 % — ABNORMAL LOW (ref 38.5–50.0)
Hemoglobin: 13 g/dL — ABNORMAL LOW (ref 13.2–17.1)
LYMPHS PCT: 24 %
Lymphs Abs: 2328 cells/uL (ref 850–3900)
MCH: 31.4 pg (ref 27.0–33.0)
MCHC: 33.9 g/dL (ref 32.0–36.0)
MCV: 92.8 fL (ref 80.0–100.0)
MONO ABS: 582 {cells}/uL (ref 200–950)
MPV: 9.1 fL (ref 7.5–12.5)
Monocytes Relative: 6 %
NEUTROS PCT: 65 %
Neutro Abs: 6305 cells/uL (ref 1500–7800)
PLATELETS: 236 10*3/uL (ref 140–400)
RBC: 4.14 MIL/uL — AB (ref 4.20–5.80)
RDW: 14.5 % (ref 11.0–15.0)
WBC: 9.7 10*3/uL (ref 3.8–10.8)

## 2015-05-08 LAB — POCT URINALYSIS DIP (DEVICE)
BILIRUBIN URINE: NEGATIVE
Glucose, UA: NEGATIVE mg/dL
HGB URINE DIPSTICK: NEGATIVE
Ketones, ur: NEGATIVE mg/dL
LEUKOCYTES UA: NEGATIVE
NITRITE: NEGATIVE
PH: 7 (ref 5.0–8.0)
Protein, ur: NEGATIVE mg/dL
SPECIFIC GRAVITY, URINE: 1.01 (ref 1.005–1.030)
Urobilinogen, UA: 0.2 mg/dL (ref 0.0–1.0)

## 2015-05-08 NOTE — Patient Instructions (Signed)
Care of a Feeding Tube People who have trouble swallowing or cannot take food or medicine by mouth are sometimes given feeding tubes. A feeding tube can go into the nose and down to the stomach or through the skin in the abdomen and into the stomach or small bowel. Some of the names of these feeding tubes are gastrostomy tubes, PEG lines, nasogastric tubes, and gastrojejunostomy tubes.  SUPPLIES NEEDED TO CARE FOR THE TUBE SITE  Clean gloves.  Clean wash cloth, gauze pads, or soft paper towel.  Cotton swabs.  Skin barrier ointment or cream.  Soap and water.  Pre-cut foam pads or gauze (that go around the tube).  Tube tape. TUBE SITE CARE  Have all supplies ready and available.  Wash hands well.  Put on clean gloves.  Remove the soiled foam pad or gauze, if present, that is found under the tube stabilizer. Change the foam pad or gauze daily or when soiled or moist.  Check the skin around the tube site for redness, rash, swelling, drainage, or extra tissue growth. If you notice any of these, call your caregiver.  Moisten gauze and cotton swabs with water and soap.  Wipe the area closest to the tube (right near the stoma) with cotton swabs. Wipe the surrounding skin with moistened gauze. Rinse with water.  Dry the skin and stoma site with a dry gauze pad or soft paper towel. Do not use antibiotic ointments at the tube site.  If the skin is red, apply a skin barrier cream or ointment (such as petroleum jelly) in a circular motion, using a cotton swab. The cream or ointment will provide a moisture barrier for the skin and helps with wound healing.  Apply a new pre-cut foam pad or gauze around the tube. Secure it with tape around the edges. If no drainage is present, foam pads or gauze may be left off.  Use tape or an anchoring device to fasten the feeding tube to the skin for comfort or as directed. Rotate where you tape the tube to avoid skin damage from the adhesive.  Position  the person in a semi-upright position (30-45 degree angle).  Throw away used supplies.  Remove gloves.  Wash hands. SUPPLIES NEEDED TO FLUSH A FEEDING TUBE 1. Clean gloves. 2. 60 mL syringe (that connects to the feeding tube). 3. Towel. 4. Water. FLUSHING A FEEDING TUBE   Have all supplies ready and available.  Wash hands well.  Put on clean gloves.  Draw up 30 mL of water in the syringe.  Kink the feeding tube while disconnecting it from the feeding-bag tubing or while removing the plug at the end of the tube. Kinking closes the tube and prevents secretions in the tube from spilling out.  Insert the tip of the syringe into the end of the feeding tube. Release the kink. Slowly inject the water.  If unable to inject the water, the person with the feeding tube should lay on his or her left side. The tip of the tube may be against the stomach wall, blocking fluid flow. Changing positions may move the tip away from the stomach wall. After repositioning, try injecting the water again.  After injecting the water, remove the syringe.  Always flush before giving the first medicine, between medicines, and after the final medicine before starting a feeding. This prevents medicines from clogging the tube.  Throw away used supplies.  Remove gloves.  Wash hands.   This information is not intended to replace  advice given to you by your health care provider. Make sure you discuss any questions you have with your health care provider.   Document Released: 12/29/2004 Document Revised: 12/16/2011 Document Reviewed: 08/13/2011 Elsevier Interactive Patient Education 2016 Elsevier Inc[employer] PEG Tube Home Guide A percutaneous endoscopic gastrostomy (PEG) tube is used to deliver food and fluids directly into the stomach. The tube has a clamp, a cap, and two anchors (bolsters). One bolster keeps the tube from coming out of the stomach. The other bolster holds the tube against the abdomen. You will be  taught how to use and adjust your PEG tube before you leave the hospital. You will also be taught how to care for the opening (stoma) in your abdomen. Make sure that you understand:  How to care for your PEG tube.  How to care for your stoma.  How to give yourself feedings and medicines.  When to call your health care provider for help. HOW DO I CARE FOR MY PEG TUBE? Check your PEG tube every day. Make sure:  It is not too tight. The bolster should rest gently over the stoma.  It is in the right position. There is a mark on the tube that shows when it is in the right position. Adjust the tube if you need to. HOW DO I CARE FOR MY STOMA? Clean your stoma every day. Follow these steps: 5. Wash your hands with soap and water. 6. Check your stoma for redness, leaking, or skin irritation. 7. Wash the stoma gently with warm, soapy water. 8. Rinse the stoma with warm water. 9. Pat the stoma area dry. You may place a gauze pad over the opening between the outer bolster and your stoma. HOW DO I GIVE MYSELF NUTRITIONAL FORMULA? Your health care provider will give you instructions about:  How much nutrition and fluid you will need for each feeding.  How often to have a feeding.  Whether to take medicine in the tube by itself or with a feeding. To give yourself a feeding, follow these steps: 1. Lay out all of the equipment that you will need. 2. Make sure that the nutritional formula is at room temperature. 3. Wash your hands with soap and water. 4. Position yourself so that you are upright. You will need to stay upright throughout the feeding and for at least 30 minutes after the feeding. 5. Make sure the syringe plunger is pushed in. Place the tip of the syringe in clear water, and slowly pull the plunger to bring (draw up) the water into the syringe. 6. Remove the clamp and the cap from the PEG tube. 7. Push the water out of the syringe to clean (flush) the tube. 8. If the tube is clear,  draw up the formula into the syringe. Make sure to use the right amount for each feeding and add water if necessary. 9. Slowly push the formula from the syringe through the tube. 10. After the feeding, flush the tube with water. 11. Put the clamp and the cap on the tube. HOW DO I GIVE MYSELF MEDICINE? To give yourself medicine, follow these steps: 1. Lay out all of the equipment that you will need. 2. If your medicine is in tablet form, crush the tablet and dissolve it in water. 3. Wash your hands with soap and water. 4. Position yourself so that you are upright. You will need to stay upright while you give yourself medicine and for at least 30 minutes afterward. 5. Make sure the  syringe plunger is pushed in. Place the tip of the syringe in clear water, and slowly pull the plunger to bring (draw up) the water into the syringe. 6. Remove the clamp and the cap from the PEG tube. 7. Push the water out of the syringe to clean (flush) the tube. 8. If the tube is clear, draw up the medicine into the syringe. 9. Slowly push the medicine from the syringe through the tube. 10. Flush the tube with water. 11. Put the clamp and the cap on the tube. You should not take sustained release (SR) medicines through your tube. If you are unsure if your medicine is a SR medicine, ask your health care provider or pharmacist. [place] IF:  You have soreness, redness, or irritation around your stoma.  You have abdominal pain or bloating during or after your feedings.  You have nausea, constipation, or diarrhea that will not go away.  You have a fever.  You have problems with your PEG tube. SEEK IMMEDIATE MEDICAL CARE IF:  Your tube is blocked.  Your tube falls out.  You have pain around your tube.  You are bleeding from your tube.  Your tube is leaking.  You choke or you have trouble breathing during or after a feeding.   This information is not intended to replace advice given to you by  your health care provider. Make sure you discuss any questions you have with your health care provider.   Document Released: 05/15/2014 Document Reviewed: 05/15/2014 Elsevier Interactive Patient Education [employer].

## 2015-05-08 NOTE — Progress Notes (Signed)
Subjective:    Patient ID: Marcus Brooks, male    DOB: [birth year], 55 y.o.   MRN: [id number]  HPI Marcus Brooks, a 55 year old male wtiha history of alcoholism, chronic pancreatitis, and poor nutrition presents to establish care. He was under the care of Dr. Lysle Pearl at [facility] in [place], [name]. Patient was evaluated last month ago. He says that his previous primary provider is relocating to another area.   Marcus Brooks has a history of chronic pancreatitis, which was alcohol reduced. He states that he has not had alcohol in greater than 4 months. He had a left upper quadrant PEG tube placed during one of his pancreatitis flares.  He continues to have tenderness to the PEG insertion site. He is not currently under the care of gastroenterology.  Symptoms include occasional diarrhea. Symptoms have been stable over the past year.  . Aggravating factors include having more than 3 tube feeds per day. Prior diagnostic studies include abdominal CT on 03/13/2013. According to previous records, PEG tube was placed in 2010.   Marcus Brooks also has a history of hypertension. He was previously on Amlodipine 10 mg. Previous provider has been holding anti-hypertensive medication. Blood pressure has been at goal without medication. He does not exercise and diet is controlled on tube feedings. He does not check blood pressures at home.   Patient denies chest pain, chest pressure/discomfort, dyspnea, fatigue, irregular heart beat, palpitations, syncope and tachypnea.  Cardiovascular risk factors include  sedentary lifestyle and smoking/ tobacco exposure. Patient has been smoking 1 pack of cigarettes per day over the past 40 years.  Immunization History  Administered Date(s) Administered  . Influenza,inj,Quad PF,36+ Mos 01/10/2013  . PPD Test 01/27/2013  . Tdap 09/12/2012   Past Surgical History  Procedure Laterality Date  . Jejunostomy feeding tube  07/13/2010  . Pancreatic pseudocyst drainage   07/13/2010  . Eus  03/26/2009    [facility]  . Portacath placement  11/28/2008    [facility]  . Gastrostomy-jejeunostomy tube change/placement  12/11/2008    [facility]  . I&d extremity Left 09/12/2012    Procedure: IRRIGATION AND DEBRIDEMENT LEFT FIFTH FINGER;  Surgeon: Roseanne Kaufman, MD;  Location: [place];  Service: Orthopedics;  Laterality: Left;  . Open reduction internal fixation (orif) metacarpal Left 09/12/2012    Procedure: OPEN REDUCTION INTERNAL FIXATION LEFT FIFTH FINGER;  Surgeon: Roseanne Kaufman, MD;  Location: [place];  Service: Orthopedics;  Laterality: Left;  . Nerve and tendon repair Left 09/12/2012    Procedure: NERVE AND TENDON REPAIR LEFT FIFTH FINGER;  Surgeon: Roseanne Kaufman, MD;  Location: [place];  Service: Orthopedics;  Laterality: Left;  . Coronary angioplasty with stent placement    . Esophagogastroduodenoscopy N/A 01/18/2013    Procedure: ESOPHAGOGASTRODUODENOSCOPY (EGD);  Surgeon: Rogene Houston, MD;  [facility];  Service: Endoscopy;  Laterality: N/A;   Review of Systems  Constitutional: Negative.  Negative for fever and fatigue.  HENT: Negative.   Eyes: Negative.  Negative for photophobia and visual disturbance.  Respiratory: Negative.  Negative for chest tightness.   Cardiovascular: Negative.  Negative for chest pain, palpitations and leg swelling.  Gastrointestinal: Positive for diarrhea (occasional).  Endocrine: Negative.  Negative for polydipsia, polyphagia and polyuria.  Genitourinary: Negative.   Musculoskeletal: Negative.   Skin: Negative.   Allergic/Immunologic: Negative.  Negative for immunocompromised state.  Neurological: Positive for numbness (left leg neuropathy controlled on gabapentin).  Hematological: Negative.   Psychiatric/Behavioral: Positive for  sleep disturbance.       Objective:   Physical Exam  Constitutional: He is oriented to person, place, and time. He appears well-developed. He appears cachectic.  HENT:  Head:  Normocephalic and atraumatic.  Right Ear: External ear normal.  Left Ear: External ear normal.  Nose: Nose normal.  Mouth/Throat: Oropharynx is clear and moist. Abnormal dentition (partially edentulous).  Eyes: Conjunctivae and EOM are normal. Pupils are equal, round, and reactive to light.  Neck: Trachea normal and normal range of motion. Neck supple.  Cardiovascular: Normal rate, regular rhythm, normal heart sounds and intact distal pulses.   Pulmonary/Chest: Effort normal and breath sounds normal.  Abdominal: Soft. Bowel sounds are normal.    LUG PEG tube, no erythema, pus, or edema, tender to palpation.   Musculoskeletal: Normal range of motion.  Neurological: He is oriented to person, place, and time. He has normal reflexes.  Skin: Skin is warm and dry.  Psychiatric: He has a normal mood and affect. His speech is normal and behavior is normal. Thought content normal.      BP 125/68 mmHg  Pulse 79  Temp(Src) 97.6 F (36.4 C) (Oral)  Resp 16  Ht '5\' 6"'$  (1.676 m)  Wt 111 lb (50.349 kg)  BMI 17.92 kg/m2  SpO2 98% Assessment & Plan:  1. Essential hypertension Blood pressure is at goal with out Amlodipine 10 mg, will continue to hold. Will check urine protein.  - POCT urinalysis dipstick - COMPLETE METABOLIC PANEL WITH GFR  2. Seizure disorder [facility]) Marcus Brooks reports that last seizure was in October 2016.  - Phenytoin level, total  3. Alcohol-induced chronic pancreatitis [facility]) Marcus Brooks says that he is not under the care of gastroenterology. He had a PEG tube placed during a previous flare of pancreatitis. Reviewed previous CT scan from 03/13/2013, pseudocysts present. He is unsure of the date that it was placed. He says that he no longer drinks alcohol. He has been sober for 4 months. He continues Creon three times daily prior to meals. Patient warrants a referral to gastroenterology for further evaluation.  -Ambulatory referral to gastroenterology  4. Anemia, unspecified  anemia type - CBC with Differential  5. Pain around PEG tube site, sequela Patient continues to use a feeding syringe 3-4 times daily to give himself formula. Recommend routine skin care 1-2 times daily to prevent skin breakdown. No pus, erythema, or edema to PEG site.   6. Tobacco use disorder Smoking cessation instruction/counseling given:  counseled patient on the dangers of tobacco use, advised patient to stop smoking, and reviewed strategies to maximize success    RTC: 3 months for hypertension. Will follow up by phone with laboratory results.     Will review medical records as they become available.    [name], FNP

## 2015-05-09 ENCOUNTER — Other Ambulatory Visit: Payer: Self-pay | Admitting: Family Medicine

## 2015-05-09 DIAGNOSIS — G40909 Epilepsy, unspecified, not intractable, without status epilepticus: Secondary | ICD-10-CM

## 2015-05-09 LAB — PHENYTOIN LEVEL, TOTAL: Phenytoin Lvl: 8.6 ug/mL — ABNORMAL LOW (ref 10.0–20.0)

## 2015-05-09 MED ORDER — PHENYTOIN SODIUM EXTENDED 100 MG PO CAPS: 400.0000 mg | ORAL_CAPSULE | Freq: Every evening | ORAL | Status: DC

## 2015-06-07 ENCOUNTER — Other Ambulatory Visit (INDEPENDENT_AMBULATORY_CARE_PROVIDER_SITE_OTHER): Payer: Medicaid Other

## 2015-06-07 DIAGNOSIS — G40909 Epilepsy, unspecified, not intractable, without status epilepticus: Secondary | ICD-10-CM

## 2015-06-08 LAB — PHENYTOIN LEVEL, TOTAL: PHENYTOIN LVL: 34.1 ug/mL — AB (ref 10.0–20.0)

## 2015-06-10 ENCOUNTER — Emergency Department: Payer: Medicaid Other

## 2015-06-10 ENCOUNTER — Observation Stay
Admission: EM | Admit: 2015-06-10 | Discharge: 2015-06-11 | Disposition: A | Discharge status: Home / Self Care | Payer: Medicaid Other | Attending: Internal Medicine | Admitting: Internal Medicine

## 2015-06-10 DIAGNOSIS — D735 Infarction of spleen: Secondary | ICD-10-CM | POA: Diagnosis not present

## 2015-06-10 DIAGNOSIS — Z23 Encounter for immunization: Secondary | ICD-10-CM | POA: Insufficient documentation

## 2015-06-10 DIAGNOSIS — H532 Diplopia: Secondary | ICD-10-CM | POA: Diagnosis not present

## 2015-06-10 DIAGNOSIS — I6523 Occlusion and stenosis of bilateral carotid arteries: Secondary | ICD-10-CM | POA: Diagnosis not present

## 2015-06-10 DIAGNOSIS — K8681 Exocrine pancreatic insufficiency: Secondary | ICD-10-CM | POA: Insufficient documentation

## 2015-06-10 DIAGNOSIS — G9389 Other specified disorders of brain: Secondary | ICD-10-CM | POA: Diagnosis not present

## 2015-06-10 DIAGNOSIS — Z8673 Personal history of transient ischemic attack (TIA), and cerebral infarction without residual deficits: Secondary | ICD-10-CM | POA: Insufficient documentation

## 2015-06-10 DIAGNOSIS — I2510 Atherosclerotic heart disease of native coronary artery without angina pectoris: Secondary | ICD-10-CM | POA: Diagnosis present

## 2015-06-10 DIAGNOSIS — G40909 Epilepsy, unspecified, not intractable, without status epilepticus: Secondary | ICD-10-CM

## 2015-06-10 DIAGNOSIS — Z79891 Long term (current) use of opiate analgesic: Secondary | ICD-10-CM | POA: Insufficient documentation

## 2015-06-10 DIAGNOSIS — G894 Chronic pain syndrome: Secondary | ICD-10-CM | POA: Diagnosis not present

## 2015-06-10 DIAGNOSIS — K219 Gastro-esophageal reflux disease without esophagitis: Secondary | ICD-10-CM | POA: Diagnosis not present

## 2015-06-10 DIAGNOSIS — F17210 Nicotine dependence, cigarettes, uncomplicated: Secondary | ICD-10-CM | POA: Insufficient documentation

## 2015-06-10 DIAGNOSIS — Z79899 Other long term (current) drug therapy: Secondary | ICD-10-CM | POA: Insufficient documentation

## 2015-06-10 DIAGNOSIS — I10 Essential (primary) hypertension: Secondary | ICD-10-CM | POA: Diagnosis not present

## 2015-06-10 DIAGNOSIS — I700 Atherosclerosis of aorta: Secondary | ICD-10-CM | POA: Insufficient documentation

## 2015-06-10 DIAGNOSIS — J449 Chronic obstructive pulmonary disease, unspecified: Secondary | ICD-10-CM | POA: Diagnosis not present

## 2015-06-10 DIAGNOSIS — F419 Anxiety disorder, unspecified: Secondary | ICD-10-CM | POA: Insufficient documentation

## 2015-06-10 DIAGNOSIS — K861 Other chronic pancreatitis: Secondary | ICD-10-CM | POA: Diagnosis not present

## 2015-06-10 DIAGNOSIS — R531 Weakness: Secondary | ICD-10-CM | POA: Insufficient documentation

## 2015-06-10 DIAGNOSIS — E43 Unspecified severe protein-calorie malnutrition: Secondary | ICD-10-CM | POA: Diagnosis not present

## 2015-06-10 DIAGNOSIS — G459 Transient cerebral ischemic attack, unspecified: Secondary | ICD-10-CM

## 2015-06-10 DIAGNOSIS — R42 Dizziness and giddiness: Secondary | ICD-10-CM | POA: Diagnosis not present

## 2015-06-10 DIAGNOSIS — K449 Diaphragmatic hernia without obstruction or gangrene: Secondary | ICD-10-CM | POA: Diagnosis not present

## 2015-06-10 DIAGNOSIS — J323 Chronic sphenoidal sinusitis: Secondary | ICD-10-CM | POA: Diagnosis not present

## 2015-06-10 DIAGNOSIS — F17200 Nicotine dependence, unspecified, uncomplicated: Secondary | ICD-10-CM | POA: Diagnosis present

## 2015-06-10 DIAGNOSIS — I639 Cerebral infarction, unspecified: Secondary | ICD-10-CM | POA: Diagnosis present

## 2015-06-10 LAB — CBC
HCT: 40 % (ref 40.0–52.0)
Hemoglobin: 13.6 g/dL (ref 13.0–18.0)
MCH: 32.5 pg (ref 26.0–34.0)
MCHC: 34 g/dL (ref 32.0–36.0)
MCV: 95.5 fL (ref 80.0–100.0)
PLATELETS: 202 10*3/uL (ref 150–440)
RBC: 4.19 MIL/uL — AB (ref 4.40–5.90)
RDW: 13.7 % (ref 11.5–14.5)
WBC: 9.5 10*3/uL (ref 3.8–10.6)

## 2015-06-10 LAB — COMPREHENSIVE METABOLIC PANEL
ALBUMIN: 4.2 g/dL (ref 3.5–5.0)
ALT: 16 U/L — ABNORMAL LOW (ref 17–63)
ANION GAP: 9 (ref 5–15)
AST: 22 U/L (ref 15–41)
Alkaline Phosphatase: 98 U/L (ref 38–126)
BUN: 7 mg/dL (ref 6–20)
CHLORIDE: 104 mmol/L (ref 101–111)
CO2: 23 mmol/L (ref 22–32)
Calcium: 8.9 mg/dL (ref 8.9–10.3)
Creatinine, Ser: 0.75 mg/dL (ref 0.61–1.24)
GFR calc Af Amer: >60 mL/min (ref >60)
GFR calc non Af Amer: >60 mL/min (ref >60)
GLUCOSE: 169 mg/dL — AB (ref 65–99)
POTASSIUM: 4 mmol/L (ref 3.5–5.1)
SODIUM: 136 mmol/L (ref 135–145)
Total Bilirubin: 0.3 mg/dL (ref 0.3–1.2)
Total Protein: 7.9 g/dL (ref 6.5–8.1)

## 2015-06-10 LAB — DIFFERENTIAL
Basophils Absolute: 0.1 10*3/uL (ref 0–0.1)
Basophils Relative: 1 %
EOS ABS: 0.3 10*3/uL (ref 0–0.7)
Lymphs Abs: 2.9 10*3/uL (ref 1.0–3.6)
Monocytes Absolute: 0.7 10*3/uL (ref 0.2–1.0)
Monocytes Relative: 7 %
Neutro Abs: 5.6 10*3/uL (ref 1.4–6.5)

## 2015-06-10 LAB — PROTIME-INR
INR: 1.07
PROTHROMBIN TIME: 14.1 s (ref 11.4–15.0)

## 2015-06-10 LAB — GLUCOSE, CAPILLARY: Glucose-Capillary: 167 mg/dL — ABNORMAL HIGH (ref 65–99)

## 2015-06-10 LAB — APTT: aPTT: 32 seconds (ref 24–36)

## 2015-06-10 LAB — PHENYTOIN LEVEL, TOTAL: Phenytoin Lvl: 34.5 ug/mL (ref 10.0–20.0)

## 2015-06-10 LAB — TROPONIN I: Troponin I: <0.03 ng/mL (ref <0.031)

## 2015-06-10 MED ORDER — PHENYTOIN SODIUM EXTENDED 100 MG PO CAPS: 400.0000 mg | ORAL_CAPSULE | Freq: Every evening | ORAL | Status: DC

## 2015-06-10 MED ORDER — TRAZODONE HCL 100 MG PO TABS
200.0000 mg | ORAL_TABLET | Freq: Every day | ORAL | Status: DC
  Administered 2015-06-11: 200 mg via ORAL
  Filled 2015-06-10: qty 2

## 2015-06-10 MED ORDER — ENOXAPARIN SODIUM 40 MG/0.4ML ~~LOC~~ SOLN: 40.0000 mg | SUBCUTANEOUS | Status: DC

## 2015-06-10 MED ORDER — PANTOPRAZOLE SODIUM 40 MG PO TBEC
40.0000 mg | DELAYED_RELEASE_TABLET | Freq: Every day | ORAL | Status: DC
  Administered 2015-06-10 – 2015-06-11 (×2): 40 mg via ORAL
  Filled 2015-06-10 (×2): qty 1

## 2015-06-10 MED ORDER — POTASSIUM CHLORIDE CRYS ER 20 MEQ PO TBCR
40.0000 meq | EXTENDED_RELEASE_TABLET | Freq: Every day | ORAL | Status: DC
  Administered 2015-06-10 – 2015-06-11 (×2): 40 meq via ORAL
  Filled 2015-06-10 (×2): qty 2

## 2015-06-10 MED ORDER — GABAPENTIN 300 MG PO CAPS
300.0000 mg | ORAL_CAPSULE | Freq: Three times a day (TID) | ORAL | Status: DC
  Administered 2015-06-10 – 2015-06-11 (×3): 300 mg via ORAL
  Filled 2015-06-10 (×3): qty 1

## 2015-06-10 MED ORDER — ONDANSETRON HCL 4 MG PO TABS: 4.0000 mg | ORAL_TABLET | Freq: Four times a day (QID) | ORAL | Status: DC | PRN

## 2015-06-10 MED ORDER — SENNOSIDES-DOCUSATE SODIUM 8.6-50 MG PO TABS: 1.0000 | ORAL_TABLET | Freq: Every evening | ORAL | Status: DC | PRN

## 2015-06-10 MED ORDER — HYDROCODONE-ACETAMINOPHEN 5-325 MG PO TABS
2.0000 | ORAL_TABLET | ORAL | Status: DC | PRN
  Administered 2015-06-10 – 2015-06-11 (×3): 2 via ORAL
  Filled 2015-06-10 (×3): qty 2

## 2015-06-10 MED ORDER — PNEUMOCOCCAL VAC POLYVALENT 25 MCG/0.5ML IJ INJ
0.5000 mL | INJECTION | INTRAMUSCULAR | Status: AC
  Administered 2015-06-11: 0.5 mL via INTRAMUSCULAR
  Filled 2015-06-10: qty 0.5

## 2015-06-10 MED ORDER — AMLODIPINE BESYLATE 10 MG PO TABS
10.0000 mg | ORAL_TABLET | Freq: Every day | ORAL | Status: DC
  Filled 2015-06-10: qty 1

## 2015-06-10 MED ORDER — ONDANSETRON HCL 4 MG/2ML IJ SOLN
4.0000 mg | Freq: Four times a day (QID) | INTRAMUSCULAR | Status: DC | PRN
  Filled 2015-06-10: qty 2

## 2015-06-10 MED ORDER — MIRTAZAPINE 15 MG PO TABS
15.0000 mg | ORAL_TABLET | Freq: Every day | ORAL | Status: DC
  Administered 2015-06-10: 15 mg via ORAL
  Filled 2015-06-10: qty 1

## 2015-06-10 MED ORDER — ASPIRIN 81 MG PO CHEW
81.0000 mg | CHEWABLE_TABLET | Freq: Every day | ORAL | Status: DC
  Administered 2015-06-11: 81 mg via ORAL
  Filled 2015-06-10: qty 1

## 2015-06-10 MED ORDER — AMITRIPTYLINE HCL 50 MG PO TABS
25.0000 mg | ORAL_TABLET | Freq: Every day | ORAL | Status: DC
  Administered 2015-06-10: 25 mg via ORAL
  Filled 2015-06-10: qty 1

## 2015-06-10 MED ORDER — ACETAMINOPHEN 325 MG PO TABS: 650.0000 mg | ORAL_TABLET | Freq: Four times a day (QID) | ORAL | Status: DC | PRN

## 2015-06-10 MED ORDER — ACETAMINOPHEN 650 MG RE SUPP: 650.0000 mg | Freq: Four times a day (QID) | RECTAL | Status: DC | PRN

## 2015-06-10 MED ORDER — POTASSIUM CHLORIDE IN NACL 20-0.9 MEQ/L-% IV SOLN
INTRAVENOUS | Status: DC
  Administered 2015-06-10: 22:00:00 via INTRAVENOUS
  Filled 2015-06-10 (×4): qty 1000

## 2015-06-10 MED ORDER — PANCRELIPASE (LIP-PROT-AMYL) 12000-38000 UNITS PO CPEP
48000.0000 [IU] | ORAL_CAPSULE | Freq: Three times a day (TID) | ORAL | Status: DC
  Administered 2015-06-11 (×2): 48000 [IU] via ORAL
  Filled 2015-06-10 (×4): qty 4

## 2015-06-10 MED ORDER — ENSURE ENLIVE PO LIQD
237.0000 mL | Freq: Three times a day (TID) | ORAL | Status: DC
  Administered 2015-06-10 – 2015-06-11 (×3): 237 mL

## 2015-06-10 MED ORDER — ASPIRIN 81 MG PO CHEW
324.0000 mg | CHEWABLE_TABLET | Freq: Once | ORAL | Status: AC
  Administered 2015-06-10: 324 mg via ORAL
  Filled 2015-06-10: qty 4

## 2015-06-10 MED ORDER — SODIUM CHLORIDE 0.9% FLUSH
3.0000 mL | Freq: Two times a day (BID) | INTRAVENOUS | Status: DC
  Administered 2015-06-10 – 2015-06-11 (×2): 3 mL via INTRAVENOUS

## 2015-06-10 NOTE — Progress Notes (Signed)
Pt dilantin level 34.5. MD Dr. Claria Dice notified, orders given to hold dilantin and repeat draw in am. Rachael Fee, RN

## 2015-06-10 NOTE — Progress Notes (Signed)
Patient with elevated Dilantin level 34.5. This likely accounts of patient's symptoms. We'll hold Dilantin and repeat Dilantin level in the a.m.

## 2015-06-10 NOTE — Consult Note (Signed)
Code Stroke page:  Chaplain met patient and wife in ED, provided emotional support as testing done to determine if patient has had a stroke.  Offered ongoing support, if needed.  Wife states they are okay at this time, but will have nurse contact chaplain dept if needed.

## 2015-06-10 NOTE — ED Provider Notes (Signed)
[facility] Emergency Department Provider Note   ____________________________________________  Time seen: Approximately 5:17 PM  I have reviewed the triage vital signs and the nursing notes.   HISTORY  Chief Complaint Extremity Weakness and Diplopia    HPI Marcus Brooks is a 55 y.o. male with history of GERD, chronic abdominal pain and chronic pancreatitis, history of alcoholism, history of poor nutrition with PEG tube in place, hypertension, seizures who presents for evaluation of diplopia which he noted on awakening this morning as well as some weakness in bilateral lower extremities today, constant since onset, moderate, no modifying factors. Patient reports when he opens both eyes "I see 3 of you". He denies any speech difficulty, no nausea, vomiting, diarrhea, fevers or chills. No chest pain or difficulty breathing. He has chronic abdominal pain which is unchanged from prior.   Past Medical History  Diagnosis Date  . Pancreatitis chronic 08/04/2010  . GERD (gastroesophageal reflux disease)   . Insomnia 08/04/2010  . Seizure ([place]) 08/04/2010  . Chronic pain syndrome 08/04/2010  . Unspecified essential hypertension 08/04/2010  . Chronic pancreatitis ([place]) 08/04/2010  . Anxiety states 08/04/2010  . Chronic airway obstruction, not elsewhere classified 08/04/2010  . Tobacco use disorder 08/04/2010  . Other abnormal glucose 08/04/2010  . Coronary artery disease   . Splenic vein thrombosis     Chronic.  Marland Kitchen Hiatal hernia     EGD 01/18/13    Patient Active Problem List   Diagnosis Date Noted  . Pancreatic pseudocyst 03/13/2013  . Cellulitis 01/24/2013  . Gastritis 01/24/2013  . Pancreatitis 01/23/2013  . Irritation around percutaneous endoscopic gastrostomy (PEG) tube site ([place]) 01/23/2013  . Hyponatremia 01/19/2013  . Hiatal hernia 01/19/2013  . Acute blood loss anemia 01/18/2013  . 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 Coronary artery disease     . Upper GI bleed 01/17/2013  . Chronic pancreatitis ([place]) 01/17/2013  . Protein-calorie malnutrition, severe ([place]) 01/10/2013  . Hematemesis 01/09/2013  . Pancreatitis, acute 01/09/2013  . Seizure disorder ([place]) 01/09/2013  . Syncope 01/09/2013  . Acute pancreatitis 10/03/2012  . Leukocytosis 10/03/2012  . Transaminitis 10/03/2012  . Chest pain 10/03/2012  . Pain around PEG tube site 03/09/2011  . Hypertension 11/05/2010  . GERD (gastroesophageal reflux disease) 11/05/2010  . Chronic pain syndrome 08/04/2010  . Tobacco use disorder 08/04/2010    Past Surgical History  Procedure Laterality Date  . Jejunostomy feeding tube  07/13/2010  . Pancreatic pseudocyst drainage  07/13/2010  . Eus  03/26/2009    [facility]  . Portacath placement  11/28/2008    FLEISHMAN  . Gastrostomy-jejeunostomy tube change/placement  12/11/2008    MICHAEL SHICK  . I&d extremity Left 09/12/2012    Procedure: IRRIGATION AND DEBRIDEMENT LEFT FIFTH FINGER;  Surgeon: Roseanne Kaufman, MD;  Location: [place];  Service: Orthopedics;  Laterality: Left;  . Open reduction internal fixation (orif) metacarpal Left 09/12/2012    Procedure: OPEN REDUCTION INTERNAL FIXATION LEFT FIFTH FINGER;  Surgeon: Roseanne Kaufman, MD;  Location: [place];  Service: Orthopedics;  Laterality: Left;  . Nerve and tendon repair Left 09/12/2012    Procedure: NERVE AND TENDON REPAIR LEFT FIFTH FINGER;  Surgeon: Roseanne Kaufman, MD;  Location: [place];  Service: Orthopedics;  Laterality: Left;  . Coronary angioplasty with stent placement    . Esophagogastroduodenoscopy N/A 01/18/2013    Procedure: ESOPHAGOGASTRODUODENOSCOPY (EGD);  Surgeon: Rogene Houston, MD;  [facility];  Service: Endoscopy;  Laterality: N/A;  Current Outpatient Rx  Name  Route  Sig  Dispense  Refill  . albuterol (PROVENTIL HFA;VENTOLIN HFA) 108 (90 BASE) MCG/ACT inhaler   Inhalation   Inhale 2 puffs into the lungs every 6 (six) hours as needed for wheezing or  shortness of breath.          Marland Kitchen amitriptyline (ELAVIL) 25 MG tablet   Oral   Take 25 mg by mouth at bedtime.          Marland Kitchen amLODipine (NORVASC) 10 MG tablet   Oral   Take 10 mg by mouth daily.         . ferrous sulfate 325 (65 FE) MG tablet   Oral   Take 325 mg by mouth daily with breakfast.         . gabapentin (NEURONTIN) 300 MG capsule   Oral   Take 300 mg by mouth 3 (three) times daily.          Marland Kitchen HYDROcodone-acetaminophen (NORCO/VICODIN) 5-325 MG tablet   Oral   Take 2 tablets by mouth every 4 (four) hours as needed for moderate pain.   10 tablet   0   . mirtazapine (REMERON) 15 MG tablet   Oral   Take 15 mg by mouth at bedtime.         . Pancrelipase, Lip-Prot-Amyl, (CREON) 24000 units CPEP   Oral   Take 48,000 Units by mouth 3 (three) times daily with meals.         . pantoprazole (PROTONIX) 40 MG tablet   Oral   Take 40 mg by mouth daily.         . phenytoin (DILANTIN) 100 MG ER capsule   Oral   Take 4 capsules (400 mg total) by mouth every evening.   120 capsule   1   . potassium chloride SA (K-DUR,KLOR-CON) 20 MEQ tablet   Oral   Take 2 tablets (40 mEq total) by mouth daily.   14 tablet   0   . promethazine (PHENERGAN) 25 MG tablet   Oral   Take 25 mg by mouth every 6 (six) hours as needed for nausea or vomiting.          . traZODone (DESYREL) 100 MG tablet   Oral   Take 200 mg by mouth at bedtime.          . vitamin C (ASCORBIC ACID) 500 MG tablet   Oral   Take 500 mg by mouth daily.           Allergies Review of patient's allergies indicates no known allergies.  No family history on file.  Social History Social History  Substance Use Topics  . Smoking status: Current Every Day Smoker -- 1.00 packs/day for 20 years    Types: Cigarettes  . Smokeless tobacco: Not on file     Comment: 1 pack a day since age 55  . Alcohol Use: No     Comment: No etoh since 5-6 yrs    Review of Systems Constitutional: No  fever/chills Eyes: No visual changes. ENT: No sore throat. Cardiovascular: Denies chest pain. Respiratory: Denies shortness of breath. Gastrointestinal: +chronic abdominal pain.  No nausea, no vomiting.  No diarrhea.  No constipation. Genitourinary: Negative for dysuria. Musculoskeletal: Negative for back pain. Skin: Negative for rash. Neurological: Negative for headaches, focal weakness or numbness.  10-point ROS otherwise negative.  ____________________________________________   PHYSICAL EXAM:  VITAL SIGNS: ED Triage Vitals  Enc Vitals Group  BP 06/10/15 1631 154/76 mmHg     Pulse Rate 06/10/15 1631 76     Resp 06/10/15 1631 18     Temp 06/10/15 1631 98.1 F (36.7 C)     Temp Source 06/10/15 1631 Oral     SpO2 06/10/15 1631 96 %     Weight 06/10/15 1631 100 lb (45.36 kg)     Height 06/10/15 1631 '5\' 6"'$  (1.676 m)     Head Cir --      Peak Flow --      Pain Score 06/10/15 1633 8     Pain Loc --      Pain Edu? --      Excl. in [place]? --     Constitutional: Alert and oriented. Chronically ill- appearing and in no acute distress. Eyes: Conjunctivae are normal. PERRL. EOMI. Head: Atraumatic. Nose: No congestion/rhinnorhea. Mouth/Throat: Mucous membranes are moist.  Oropharynx non-erythematous. Neck: No stridor.  Supple without meningismus. Cardiovascular: Normal rate, regular rhythm. Grossly normal heart sounds.  Good peripheral circulation. Respiratory: Normal respiratory effort.  No retractions. Lungs CTAB. Gastrointestinal: Soft and nontender. No distention. PEG tube in the left abdomen without surrounding erythema, drainage. No CVA tenderness. Genitourinary: deferred Musculoskeletal: No lower extremity tenderness nor edema.  No joint effusions. Neurologic:  Normal speech and language. No gross focal neurologic deficits are appreciated. 5 out of 5 strength bilateral upper and lower extremities, sensation intact to light touch throughout, cranial nerves II through XII  intact. Mild dysmetria with the left upper extremity on finger-nose-finger. Skin:  Skin is warm, dry and intact. No rash noted. Psychiatric: Mood and affect are normal. Speech and behavior are normal.  ____________________________________________   LABS (all labs ordered are listed, but only abnormal results are displayed)  Labs Reviewed  CBC - Abnormal; Notable for the following:    RBC 4.19 (*)    All other components within normal limits  COMPREHENSIVE METABOLIC PANEL - Abnormal; Notable for the following:    Glucose, Bld 169 (*)    ALT 16 (*)    All other components within normal limits  GLUCOSE, CAPILLARY - Abnormal; Notable for the following:    Glucose-Capillary 167 (*)    All other components within normal limits  PROTIME-INR  APTT  DIFFERENTIAL  TROPONIN I  CBG MONITORING, ED   ____________________________________________  EKG  *ED ECG REPORT I, Joanne Gavel, the attending physician, personally viewed and interpreted this ECG.   Date: 06/10/2015  EKG Time: 17:11  Rate: 67  Rhythm: normal sinus rhythm  Axis: Borderline right axis.  Intervals:none  ST&T Change: No acute ST elevation.  ____________________________________________  RADIOLOGY  CT head IMPRESSION: Mild chronic ischemic white matter disease. 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 No acute intracranial abnormality seen.  CXR  FINDINGS: Right lung apex not entirely included on present exam.  No segmental infiltrate, pulmonary edema or obvious pneumothorax.  Similar appearance of hilar structures.  Heart size within normal limits.  Calcified aorta.  Mild curvature thoracic spine convex right.  IMPRESSION: No acute abnormality. Please see above. ____________________________________________   PROCEDURES  Procedure(s) performed: None  Critical Care performed: No  ____________________________________________   INITIAL IMPRESSION / ASSESSMENT AND PLAN / ED  COURSE  Pertinent labs & imaging results that were available during my care of the patient were reviewed by me and considered in my medical decision making (see chart for details).  Marcus Brooks is a 55 y.o. male with history of GERD, chronic abdominal pain and chronic  pancreatitis, history of alcoholism, history of poor nutrition with PEG tube in place, hypertension, seizures who presents for evaluation of diplopia which he noted on awakening this morning as well as some weakness in bilateral lower extremities today, so noted on awakening. On exam, he is nontoxic appearing and in no acute distress. His vital signs are stable, he is afebrile. Stroke was initiated on his arrival however he is not a candidate for TPA as his last known normal was yesterday evening and his symptoms were noted on awakening this morning. NIH stroke scale of one. Specialist on-call has evaluated him and recommends aspirin, full stroke workup due to concern for possible cerebellar or possibly even brainstem CVA. I reviewed his labs. CBC and CMP are generally unremarkable, negative troponin. Normal INR. CT of the head with no acute intracranial abnormality. Aspirin ordered. Case discussed with hospitalist for admission at 7:15 pm. ____________________________________________   FINAL CLINICAL IMPRESSION(S) / ED DIAGNOSES  Final diagnoses:  Cerebral infarction due to unspecified mechanism      NEW MEDICATIONS STARTED DURING THIS VISIT:  New Prescriptions   No medications on file     Note:  This document was prepared using Dragon voice recognition software and may include unintentional dictation errors.    Joanne Gavel, MD 06/10/15 1919

## 2015-06-10 NOTE — ED Notes (Signed)
[facility]  REPORT  GIVEN  TO  DR  Edd Fabian

## 2015-06-10 NOTE — H&P (Signed)
PCP:   [name], FNP   Chief Complaint:  Dizziness, double vision  HPI: This is a 55 year old gentleman who states that this a.m. he developed double vision. He went on to feel dizzy and presyncopal. He developed weakness and numbness in both legs and had difficulty walking. His sister loaned him a walker but he continues to have difficulty walking due to weakness. He also developed at tic in the right arm. He denies any headache, nausea, vomiting, burning urination. He reports the nonproductive cough but this is chronic. He also reports abdominal pain but this is also chronic. When his symptoms persisted he finally came to ER. His blood pressure is normally controlled, it is in the ER. He states his dizziness is from being to improve. Teleneurology was consulted, they recommend observation and TIA rule out work up.  Review of Systems:  The patient denies anorexia, fever, weight loss, double vision, decreased hearing, hoarseness, chest pain, syncope, dyspnea on exertion, peripheral edema, balance deficits, hemoptysis, abdominal pain, melena, hematochezia, severe indigestion/heartburn, hematuria, incontinence, genital sores, muscle weakness, suspicious skin lesions, transient blindness, difficulty walking, depression, unusual weight change, abnormal bleeding, enlarged lymph nodes, angioedema, and breast masses.  Past Medical History: Past Medical History  Diagnosis Date  . Pancreatitis chronic 08/04/2010  . GERD (gastroesophageal reflux disease)   . Insomnia 08/04/2010  . Seizure ([place]) 08/04/2010  . Chronic pain syndrome 08/04/2010  . Unspecified essential hypertension 08/04/2010  . Chronic pancreatitis ([place]) 08/04/2010  . Anxiety states 08/04/2010  . Chronic airway obstruction, not elsewhere classified 08/04/2010  . Tobacco use disorder 08/04/2010  . Other abnormal glucose 08/04/2010  . Coronary artery disease   . Splenic vein thrombosis     Chronic.  Marland Kitchen Hiatal hernia     EGD  01/18/13   Past Surgical History  Procedure Laterality Date  . Jejunostomy feeding tube  07/13/2010  . Pancreatic pseudocyst drainage  07/13/2010  . Eus  03/26/2009    [facility]  . Portacath placement  11/28/2008    FLEISHMAN  . Gastrostomy-jejeunostomy tube change/placement  12/11/2008    MICHAEL SHICK  . I&d extremity Left 09/12/2012    Procedure: IRRIGATION AND DEBRIDEMENT LEFT FIFTH FINGER;  Surgeon: Roseanne Kaufman, MD;  Location: [place];  Service: Orthopedics;  Laterality: Left;  . Open reduction internal fixation (orif) metacarpal Left 09/12/2012    Procedure: OPEN REDUCTION INTERNAL FIXATION LEFT FIFTH FINGER;  Surgeon: Roseanne Kaufman, MD;  Location: [place];  Service: Orthopedics;  Laterality: Left;  . Nerve and tendon repair Left 09/12/2012    Procedure: NERVE AND TENDON REPAIR LEFT FIFTH FINGER;  Surgeon: Roseanne Kaufman, MD;  Location: [place];  Service: Orthopedics;  Laterality: Left;  . Coronary angioplasty with stent placement    . Esophagogastroduodenoscopy N/A 01/18/2013    Procedure: ESOPHAGOGASTRODUODENOSCOPY (EGD);  Surgeon: Rogene Houston, MD;  [facility];  Service: Endoscopy;  Laterality: N/A;    Medications: Prior to Admission medications   Medication Sig Start Date End Date Taking? Authorizing Provider  albuterol (PROVENTIL HFA;VENTOLIN HFA) 108 (90 BASE) MCG/ACT inhaler Inhale 2 puffs into the lungs every 6 (six) hours as needed for wheezing or shortness of breath.    Yes Historical Provider, MD  amitriptyline (ELAVIL) 25 MG tablet Take 25 mg by mouth at bedtime.    Yes Historical Provider, MD  amLODipine (NORVASC) 10 MG tablet Take 10 mg by mouth daily.   Yes Historical Provider, MD  ferrous sulfate 325 (65 FE) MG tablet Take 325  mg by mouth daily with breakfast.   Yes Historical Provider, MD  gabapentin (NEURONTIN) 300 MG capsule Take 300 mg by mouth 3 (three) times daily.    Yes Historical Provider, MD  HYDROcodone-acetaminophen (NORCO/VICODIN) 5-325 MG  tablet Take 2 tablets by mouth every 4 (four) hours as needed for moderate pain. 12/24/14  Yes Orpah Greek, MD  mirtazapine (REMERON) 15 MG tablet Take 15 mg by mouth at bedtime.   Yes Historical Provider, MD  Pancrelipase, Lip-Prot-Amyl, (CREON) 24000 units CPEP Take 48,000 Units by mouth 3 (three) times daily with meals.   Yes Historical Provider, MD  pantoprazole (PROTONIX) 40 MG tablet Take 40 mg by mouth daily.   Yes Historical Provider, MD  phenytoin (DILANTIN) 100 MG ER capsule Take 4 capsules (400 mg total) by mouth every evening. 05/09/15  Yes [name], FNP  potassium chloride SA (K-DUR,KLOR-CON) 20 MEQ tablet Take 2 tablets (40 mEq total) by mouth daily. 12/24/14  Yes Orpah Greek, MD  promethazine (PHENERGAN) 25 MG tablet Take 25 mg by mouth every 6 (six) hours as needed for nausea or vomiting.    Yes Historical Provider, MD  traZODone (DESYREL) 100 MG tablet Take 200 mg by mouth at bedtime.    Yes Historical Provider, MD  vitamin C (ASCORBIC ACID) 500 MG tablet Take 500 mg by mouth daily.   Yes Historical Provider, MD    Allergies:  No Known Allergies  Social History:  reports that he has been smoking Cigarettes.  He has a 20 pack-year smoking history. He does not have any smokeless tobacco history on file. He reports that he does not drink alcohol or use illicit drugs.  Family History: Alcohol abuse  Physical Exam: Filed Vitals:   06/10/15 1631 06/10/15 1721 06/10/15 1800  BP: 154/76  107/66  Pulse: 76  59  Temp: 98.1 F (36.7 C) 98.2 F (36.8 C)   TempSrc: Oral    Resp: 18  14  Height: '5\' 6"'$  (1.676 m)    Weight: 45.36 kg (100 lb)    SpO2: 96%  96%    General:  Alert and oriented times three, well developed and nourished, no acute distress Eyes: PERRLA, pink conjunctiva, no scleral icterus ENT: Moist oral mucosa, neck supple, no thyromegaly Lungs: clear to ascultation, no wheeze, no crackles, no use of accessory muscles Cardiovascular:  regular rate and rhythm, no regurgitation, no gallops, no murmurs. No carotid bruits, no JVD Abdomen: soft, positive BS, Generalized chronic nonspecific tenderness to palpation, PEG in place, non-distended, no organomegaly, not an acute abdomen GU: not examined Neuro: CN II - XII grossly intact, sensation intact Musculoskeletal: strength 5/5 all extremities, no clubbing, cyanosis or edema Skin: no rash, no subcutaneous crepitation, no decubitus Psych: appropriate patient   Labs on Admission:   Recent Labs  06/10/15 1651  NA 136  K 4.0  CL 104  CO2 23  GLUCOSE 169*  BUN 7  CREATININE 0.75  CALCIUM 8.9    Recent Labs  06/10/15 1651  AST 22  ALT 16*  ALKPHOS 98  BILITOT 0.3  PROT 7.9  ALBUMIN 4.2   No results for input(s): LIPASE, AMYLASE in the last 72 hours.  Recent Labs  06/10/15 1651  WBC 9.5  NEUTROABS 5.6  HGB 13.6  HCT 40.0  MCV 95.5  PLT 202    Recent Labs  06/10/15 1651  TROPONINI <0.03   Invalid input(s): POCBNP No results for input(s): DDIMER in the last 72 hours. No results  for input(s): HGBA1C in the last 72 hours. No results for input(s): CHOL, HDL, LDLCALC, TRIG, CHOLHDL, LDLDIRECT in the last 72 hours. No results for input(s): TSH, T4TOTAL, T3FREE, THYROIDAB in the last 72 hours.  Invalid input(s): FREET3 No results for input(s): VITAMINB12, FOLATE, FERRITIN, TIBC, IRON, RETICCTPCT in the last 72 hours.  Micro Results: No results found for this or any previous visit (from the past 240 hour(s)).   Radiological Exams on Admission: Ct Head Wo Contrast  06/10/2015  CLINICAL DATA:  Dizziness, double vision. EXAM: CT HEAD WITHOUT CONTRAST TECHNIQUE: Contiguous axial images were obtained from the base of the skull through the vertex without intravenous contrast. COMPARISON:  MRI of September 1st 2016.  CT scan of September 12, 2014. FINDINGS: Bony calvarium appears intact. Right occipital encephalomalacia is noted consistent with old infarction.  Mild chronic ischemic white matter disease is noted. Old right parietal infarction is noted. No mass effect or midline shift is noted. Ventricular size is within normal limits. There is no evidence of mass lesion, hemorrhage or acute infarction. IMPRESSION: Mild chronic ischemic white matter disease. 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 No acute intracranial abnormality seen. Electronically Signed   By: Marijo Conception, M.D.   On: 06/10/2015 16:52   Dg Chest Portable 1 View  06/10/2015  CLINICAL DATA:  55 year old male EXAM: PORTABLE CHEST 1 VIEW COMPARISON:  None. FINDINGS: Right lung apex not entirely included on present exam. No segmental infiltrate, pulmonary edema or obvious pneumothorax. Similar appearance of hilar structures. Heart size within normal limits. Calcified aorta. Mild curvature thoracic spine convex right. IMPRESSION: No acute abnormality.  Please see above. Electronically Signed   By: Genia Del M.D.   On: 06/10/2015 18:10    Assessment/Plan Present on Admission:  Dizziness/diplopia r/o TIA -bring in for 23 hour observation -Patient seen by teleneurology who recommended CVA workup -Aspirin daily, lipid panel in a.m. -MRI brain, MRA head, 2-D echo, carotid ultrasound -Neurochecks every 2 hours  -Nothing by mouth by mouth, swallow evaluation  . Chronic pain syndrome -Stable, aware  . Chronic pancreatitis ([place]) -Stable, resume home medications  . Hypertension -Stable, resume home medications  . Tobacco use disorder -Nicotine patch, doing nebs as needed  History of seizures -Will order Dilantin level, resume home medications  . Coronary artery disease -Stable, resume home medications  . Protein-calorie malnutrition, severe ([place]) -Continue to ensure 3 times a day via PEG  . GERD (gastroesophageal reflux disease)   Sheneka Schrom 06/10/2015, 8:19 PM

## 2015-06-10 NOTE — ED Notes (Signed)
Pt reports difficulty speaking, but does not have any difficulty swallowing.

## 2015-06-11 ENCOUNTER — Observation Stay (HOSPITAL_BASED_OUTPATIENT_CLINIC_OR_DEPARTMENT_OTHER)
Admit: 2015-06-11 | Discharge: 2015-06-11 | Disposition: A | Discharge status: Home / Self Care | Payer: Medicaid Other | Attending: Family Medicine | Admitting: Family Medicine

## 2015-06-11 ENCOUNTER — Observation Stay: Payer: Medicaid Other

## 2015-06-11 DIAGNOSIS — G459 Transient cerebral ischemic attack, unspecified: Secondary | ICD-10-CM | POA: Diagnosis not present

## 2015-06-11 LAB — CBC
HEMATOCRIT: 37.1 % — AB (ref 40.0–52.0)
Hemoglobin: 12.6 g/dL — ABNORMAL LOW (ref 13.0–18.0)
MCH: 32 pg (ref 26.0–34.0)
MCHC: 34 g/dL (ref 32.0–36.0)
MCV: 94.3 fL (ref 80.0–100.0)
PLATELETS: 187 10*3/uL (ref 150–440)
RBC: 3.93 MIL/uL — ABNORMAL LOW (ref 4.40–5.90)
RDW: 14 % (ref 11.5–14.5)
WBC: 8.4 10*3/uL (ref 3.8–10.6)

## 2015-06-11 LAB — BASIC METABOLIC PANEL
Anion gap: 5 (ref 5–15)
BUN: 8 mg/dL (ref 6–20)
CHLORIDE: 108 mmol/L (ref 101–111)
CO2: 26 mmol/L (ref 22–32)
CREATININE: 0.51 mg/dL — AB (ref 0.61–1.24)
Calcium: 8.8 mg/dL — ABNORMAL LOW (ref 8.9–10.3)
GFR calc Af Amer: >60 mL/min (ref >60)
GFR calc non Af Amer: >60 mL/min (ref >60)
Glucose, Bld: 127 mg/dL — ABNORMAL HIGH (ref 65–99)
POTASSIUM: 4.4 mmol/L (ref 3.5–5.1)
Sodium: 139 mmol/L (ref 135–145)

## 2015-06-11 LAB — PHENYTOIN LEVEL, TOTAL: PHENYTOIN LVL: 28.2 ug/mL — AB (ref 10.0–20.0)

## 2015-06-11 LAB — LIPID PANEL
CHOL/HDL RATIO: 4 ratio
CHOLESTEROL: 125 mg/dL (ref 0–200)
HDL: 31 mg/dL — AB (ref >40)
LDL Cholesterol: 76 mg/dL (ref 0–99)
Triglycerides: 91 mg/dL (ref <150)
VLDL: 18 mg/dL (ref 0–40)

## 2015-06-11 LAB — ECHOCARDIOGRAM COMPLETE
Height: 66 in
Weight: 1724.88 oz

## 2015-06-11 MED ORDER — PHENYTOIN SODIUM EXTENDED 100 MG PO CAPS: 300.0000 mg | ORAL_CAPSULE | Freq: Every day | ORAL | Status: DC

## 2015-06-11 NOTE — Progress Notes (Signed)
Patient received discharge instructions, pt verbalized understanding. IV was removed with no signs of infection. Dressing clean, dry intact. No skin tears or wounds present. Prescription was sent to pharmacy of choice. Patient was escorted out with staff member via wheelchair via private auto. No further needs from care management team.

## 2015-06-11 NOTE — Progress Notes (Signed)
*  PRELIMINARY RESULTS* Echocardiogram 2D Echocardiogram has been performed.  Marcus Brooks 06/11/2015, 10:46 AM

## 2015-06-11 NOTE — Progress Notes (Addendum)
Chaplain provided patient with Advance Directive information prior to patient going down for MRI. Chaplain will complete AD follow up after patient returns. Minerva Fester [phone]

## 2015-06-11 NOTE — Discharge Summary (Signed)
[facility] NAME: Marcus Brooks    MR#:  [id number]  DATE OF BIRTH:  [birth year]  DATE OF ADMISSION:  06/10/2015 ADMITTING PHYSICIAN: Quintella Baton, MD  DATE OF DISCHARGE:06/11/2015  PRIMARY CARE PHYSICIAN: Hollis,Lachina M, FNP    ADMISSION DIAGNOSIS:  Cerebral infarction due to unspecified mechanism [I63.9]  DISCHARGE DIAGNOSIS:  Active Problems:   Hypertension   GERD (gastroesophageal reflux disease)   Protein-calorie malnutrition, severe (HCC)   Chronic pancreatitis (HCC)   Chronic pain syndrome   Tobacco use disorder   Coronary artery disease   Dizziness   Acute stroke ruled out by negative MRI  Dizziness due to high Phenytoin level.  SECONDARY DIAGNOSIS:   Past Medical History  Diagnosis Date  . Pancreatitis chronic 08/04/2010  . GERD (gastroesophageal reflux disease)   . Insomnia 08/04/2010  . Seizure ([place]) 08/04/2010  . Chronic pain syndrome 08/04/2010  . Unspecified essential hypertension 08/04/2010  . Chronic pancreatitis ([place]) 08/04/2010  . Anxiety states 08/04/2010  . Chronic airway obstruction, not elsewhere classified 08/04/2010  . Tobacco use disorder 08/04/2010  . Other abnormal glucose 08/04/2010  . Coronary artery disease   . Splenic vein thrombosis     Chronic.  Marland Kitchen Hiatal hernia     EGD 01/18/13    HOSPITAL COURSE:   Dizziness/diplopia r/o TIA- ruled out by negative MRI -Kept on observation -Patient seen by teleneurology who recommended CVA workup -Aspirin daily, lipid panel done -MRI brain, MRA head, 2-D echo, carotid ultrasound- all without any acute findings. -Neurochecks every 2 hours - pt felt much better the next day. -his Dilantin blood level was high- he was increased dose by PMD recently.    Stopped it for 1 day and his dilantin level was coming down- advised to take less dose on d/c.  Marland Kitchen Chronic pain syndrome -Stable, aware  . Chronic pancreatitis ([place]) -Stable, resume  home medications   . Hypertension -Stable, resume home medications  . Tobacco use disorder -Nicotine patch, doing nebs as needed  History of seizures -decreased dilantin dose on d/c.  Marland Kitchen Coronary artery disease -Stable, resume home medications   DISCHARGE CONDITIONS:   Stable.  CONSULTS OBTAINED:     DRUG ALLERGIES:  No Known Allergies  DISCHARGE MEDICATIONS:   Current Discharge Medication List    CONTINUE these medications which have CHANGED   Details  phenytoin (DILANTIN) 100 MG ER capsule Take 3 capsules (300 mg total) by mouth daily. Qty: 120 capsule, Refills: 1   Associated Diagnoses: Seizure disorder ([place])      CONTINUE these medications which have NOT CHANGED   Details  albuterol (PROVENTIL HFA;VENTOLIN HFA) 108 (90 BASE) MCG/ACT inhaler Inhale 2 puffs into the lungs every 6 (six) hours as needed for wheezing or shortness of breath.     amitriptyline (ELAVIL) 25 MG tablet Take 25 mg by mouth at bedtime.     amLODipine (NORVASC) 10 MG tablet Take 10 mg by mouth daily.   Associated Diagnoses: Essential hypertension    ferrous sulfate 325 (65 FE) MG tablet Take 325 mg by mouth daily with breakfast.    gabapentin (NEURONTIN) 300 MG capsule Take 300 mg by mouth 3 (three) times daily.     HYDROcodone-acetaminophen (NORCO/VICODIN) 5-325 MG tablet Take 2 tablets by mouth every 4 (four) hours as needed for moderate pain. Qty: 10 tablet, Refills: 0    mirtazapine (REMERON) 15 MG tablet Take 15 mg by mouth at bedtime.    Pancrelipase,  Lip-Prot-Amyl, (CREON) 24000 units CPEP Take 48,000 Units by mouth 3 (three) times daily with meals.    pantoprazole (PROTONIX) 40 MG tablet Take 40 mg by mouth daily.    potassium chloride SA (K-DUR,KLOR-CON) 20 MEQ tablet Take 2 tablets (40 mEq total) by mouth daily. Qty: 14 tablet, Refills: 0    promethazine (PHENERGAN) 25 MG tablet Take 25 mg by mouth every 6 (six) hours as needed for nausea or vomiting.     traZODone  (DESYREL) 100 MG tablet Take 200 mg by mouth at bedtime.     vitamin C (ASCORBIC ACID) 500 MG tablet Take 500 mg by mouth daily.   Associated Diagnoses: Tobacco use disorder         DISCHARGE INSTRUCTIONS:   Follow with PMD in 2 weeks.  If you experience worsening of your admission symptoms, develop shortness of breath, life threatening emergency, suicidal or homicidal thoughts you must seek medical attention immediately by calling 911 or calling your MD immediately  if symptoms less severe.  You Must read complete instructions/literature along with all the possible adverse reactions/side effects for all the Medicines you take and that have been prescribed to you. Take any new Medicines after you have completely understood and accept all the possible adverse reactions/side effects.   Please note  You were cared for by a hospitalist during your hospital stay. If you have any questions about your discharge medications or the care you received while you were in the hospital after you are discharged, you can call the unit and asked to speak with the hospitalist on call if the hospitalist that took care of you is not available. Once you are discharged, your primary care physician will handle any further medical issues. Please note that NO REFILLS for any discharge medications will be authorized once you are discharged, as it is imperative that you return to your primary care physician (or establish a relationship with a primary care physician if you do not have one) for your aftercare needs so that they can reassess your need for medications and monitor your lab values.    Today   CHIEF COMPLAINT:   Chief Complaint  Patient presents with  . Extremity Weakness  . Diplopia    HISTORY OF PRESENT ILLNESS:  Marcus Brooks  is a 55 y.o. male this a.m. he developed double vision. He went on to feel dizzy and presyncopal. He developed weakness and numbness in both legs and had difficulty walking.  His sister loaned him a walker but he continues to have difficulty walking due to weakness. He also developed at tic in the right arm. He denies any headache, nausea, vomiting, burning urination. He reports the nonproductive cough but this is chronic. He also reports abdominal pain but this is also chronic. When his symptoms persisted he finally came to ER. His blood pressure is normally controlled, it is in the ER. He states his dizziness is from being to improve. Teleneurology was consulted, they recommend observation and TIA rule out work up.   VITAL SIGNS:  Blood pressure 114/61, pulse 48, temperature 97.7 F (36.5 C), temperature source Oral, resp. rate 14, height '5\' 6"'$  (1.676 m), weight 48.9 kg (107 lb 12.9 oz), SpO2 97 %.  I/O:   Intake/Output Summary (Last 24 hours) at 06/11/15 1649 Last data filed at 06/11/15 1458  Gross per 24 hour  Intake    480 ml  Output    800 ml  Net   -320 ml  PHYSICAL EXAMINATION:   General: Alert and oriented times three, well developed and nourished, no acute distress Eyes: PERRLA, pink conjunctiva, no scleral icterus ENT: Moist oral mucosa, neck supple, no thyromegaly Lungs: clear to ascultation, no wheeze, no crackles, no use of accessory muscles Cardiovascular: regular rate and rhythm, no regurgitation, no gallops, no murmurs. No carotid bruits, no JVD Abdomen: soft, positive BS, Generalized chronic nonspecific tenderness to palpation, PEG in place, non-distended, no organomegaly, not an acute abdomen GU: not examined Neuro: CN II - XII grossly intact, sensation intact Musculoskeletal: strength 5/5 all extremities, no clubbing, cyanosis or edema Skin: no rash, no subcutaneous crepitation, no decubitus Psych: appropriate patient  DATA REVIEW:   CBC  Recent Labs Lab 06/11/15 0441  WBC 8.4  HGB 12.6*  HCT 37.1*  PLT 187    Chemistries   Recent Labs Lab 06/10/15 1651 06/11/15 0441  NA 136 139  K 4.0 4.4  CL 104 108  CO2 23 26   GLUCOSE 169* 127*  BUN 7 8  CREATININE 0.75 0.51*  CALCIUM 8.9 8.8*  AST 22  --   ALT 16*  --   ALKPHOS 98  --   BILITOT 0.3  --     Cardiac Enzymes  Recent Labs Lab 06/10/15 1651  TROPONINI <0.03    Microbiology Results  Results for orders placed or performed during the hospital encounter of 01/17/13  MRSA PCR Screening     Status: None   Collection Time: 01/17/13  6:37 AM  Result Value Ref Range Status   MRSA by PCR NEGATIVE NEGATIVE Final    Comment:        The GeneXpert MRSA Assay (FDA approved for NASAL specimens only), is one component of a comprehensive MRSA colonization surveillance program. It is not intended to diagnose MRSA infection nor to guide or monitor treatment for MRSA infections.    RADIOLOGY:  Ct Head Wo Contrast  06/10/2015  CLINICAL DATA:  Dizziness, double vision. EXAM: CT HEAD WITHOUT CONTRAST TECHNIQUE: Contiguous axial images were obtained from the base of the skull through the vertex without intravenous contrast. COMPARISON:  MRI of September 1st 2016.  CT scan of September 12, 2014. FINDINGS: Bony calvarium appears intact. Right occipital encephalomalacia is noted consistent with old infarction. Mild chronic ischemic white matter disease is noted. Old right parietal infarction is noted. No mass effect or midline shift is noted. Ventricular size is within normal limits. There is no evidence of mass lesion, hemorrhage or acute infarction. IMPRESSION: Mild chronic ischemic white matter disease. 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 No acute intracranial abnormality seen. Electronically Signed   By: Marijo Conception, M.D.   On: 06/10/2015 16:52   Mr Jodene Nam Head Wo Contrast  06/11/2015  CLINICAL DATA:  New onset diplopia and difficulty walking yesterday. Initial encounter. EXAM: MRI HEAD WITHOUT CONTRAST MRA HEAD WITHOUT CONTRAST TECHNIQUE: Multiplanar, multiecho pulse sequences of the brain and surrounding structures were obtained without  intravenous contrast. Angiographic images of the head were obtained using MRA technique without contrast. COMPARISON:  CT head without contrast 06/10/2015. MRI brain 09/13/2014. FINDINGS: MRI HEAD FINDINGS The diffusion-weighted images demonstrate no evidence for acute or subacute infarction. A remote right occipital lobe infarct is again noted. A remote posterior right frontal lobe infarct is stable. Remote lacunar infarcts in the left cerebellum are stable. Mild periventricular and subcortical white matter disease is otherwise stable. No acute infarct, hemorrhage, or mass lesion is present. The internal auditory canals are within normal limits  bilaterally. The brainstem is within normal limits. No acute abnormalities present in the cerebellum. Flow is present in the major intracranial arteries. The globes and orbits are intact. Mild diffuse mucosal thickening is present throughout the ethmoid air cells and frontal sinuses bilaterally. A fluid level is present in the left sphenoid sinus. The mastoid air cells are clear. Skullbase is within normal limits. 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 Midline sagittal images are otherwise unremarkable. MRA HEAD FINDINGS Atherosclerotic changes are present within the cavernous internal carotid arteries bilaterally. Focal signal loss in the supraclinoid right ICA is exaggerated by artifact. The right A1 segment is aplastic. Both A2 segments fill from the left. The MCA bifurcations are intact. Mild distal MCA branch vessel disease is present without a significant proximal stenosis or occlusion. No focal aneurysm is present. Atherosclerotic changes are present in the distal vertebral arteries with less than 50% stenosis bilaterally. The PICA origins are visualized bilaterally. There is moderate stenosis at the origin of the left PICA. This may be related to remote infarcts. The basilar artery is somewhat irregular without  focal stenosis otherwise. Both posterior cerebral arteries originate from the basilar tip. There is moderate proximal narrowing of the right posterior cerebral artery. IMPRESSION: 1. Moderate stenosis of the proximal right PICA and proximal right posterior cerebral artery corresponding with remote infarcts in both areas. 2. Remote posterior right frontal cortical infarct. 3. No acute intracranial abnormality. 4. Age advanced mild periventricular and subcortical white matter disease otherwise likely reflects the sequela of chronic microvascular ischemia. 5. Left sphenoid sinusitis. 6. Mild mucosal disease in the anterior ethmoid air cells and frontal sinuses bilaterally. Electronically Signed   By: San Morelle M.D.   On: 06/11/2015 11:37   Mr Brain Wo Contrast  06/11/2015  CLINICAL DATA:  New onset diplopia and difficulty walking yesterday. Initial encounter. EXAM: MRI HEAD WITHOUT CONTRAST MRA HEAD WITHOUT CONTRAST TECHNIQUE: Multiplanar, multiecho pulse sequences of the brain and surrounding structures were obtained without intravenous contrast. Angiographic images of the head were obtained using MRA technique without contrast. COMPARISON:  CT head without contrast 06/10/2015. MRI brain 09/13/2014. FINDINGS: MRI HEAD FINDINGS The diffusion-weighted images demonstrate no evidence for acute or subacute infarction. A remote right occipital lobe infarct is again noted. A remote posterior right frontal lobe infarct is stable. Remote lacunar infarcts in the left cerebellum are stable. Mild periventricular and subcortical white matter disease is otherwise stable. No acute infarct, hemorrhage, or mass lesion is present. The internal auditory canals are within normal limits bilaterally. The brainstem is within normal limits. No acute abnormalities present in the cerebellum. Flow is present in the major intracranial arteries. The globes and orbits are intact. Mild diffuse mucosal thickening is present throughout  the ethmoid air cells and frontal sinuses bilaterally. A fluid level is present in the left sphenoid sinus. The mastoid air cells are clear. Skullbase is within normal limits. 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 Midline sagittal images are otherwise unremarkable. MRA HEAD FINDINGS Atherosclerotic changes are present within the cavernous internal carotid arteries bilaterally. Focal signal loss in the supraclinoid right ICA is exaggerated by artifact. The right A1 segment is aplastic. Both A2 segments fill from the left. The MCA bifurcations are intact. Mild distal MCA branch vessel disease is present without a significant proximal stenosis or occlusion. No focal aneurysm is present. Atherosclerotic changes are present in the distal vertebral arteries with less than 50%  stenosis bilaterally. The PICA origins are visualized bilaterally. There is moderate stenosis at the origin of the left PICA. This may be related to remote infarcts. The basilar artery is somewhat irregular without focal stenosis otherwise. Both posterior cerebral arteries originate from the basilar tip. There is moderate proximal narrowing of the right posterior cerebral artery. IMPRESSION: 1. Moderate stenosis of the proximal right PICA and proximal right posterior cerebral artery corresponding with remote infarcts in both areas. 2. Remote posterior right frontal cortical infarct. 3. No acute intracranial abnormality. 4. Age advanced mild periventricular and subcortical white matter disease otherwise likely reflects the sequela of chronic microvascular ischemia. 5. Left sphenoid sinusitis. 6. Mild mucosal disease in the anterior ethmoid air cells and frontal sinuses bilaterally. Electronically Signed   By: San Morelle M.D.   On: 06/11/2015 11:37   US Carotid Bilateral  06/11/2015  CLINICAL DATA:  TIA. EXAM: BILATERAL CAROTID DUPLEX ULTRASOUND TECHNIQUE: Pearline Cables scale imaging, color  Doppler and duplex ultrasound were performed of bilateral carotid and vertebral arteries in the neck. COMPARISON:  MRI 06/11/2015. FINDINGS: Criteria: Quantification of carotid stenosis is based on velocity parameters that correlate the residual internal carotid diameter with NASCET-based stenosis levels, using the diameter of the distal internal carotid lumen as the denominator for stenosis measurement. The following velocity measurements were obtained: RIGHT ICA:  65/23 cm/sec CCA:  55/73 cm/sec SYSTOLIC ICA/CCA RATIO:  0.9 DIASTOLIC ICA/CCA RATIO:  1.4 ECA:  142 cm/sec LEFT ICA:  95/26 cm/sec CCA:  22/02 cm/sec SYSTOLIC ICA/CCA RATIO:  1.1 DIASTOLIC ICA/CCA RATIO:  1.1 ECA:  109 cm/sec RIGHT CAROTID ARTERY: Mild right carotid bifurcation plaque. No flow limiting stenosis. RIGHT VERTEBRAL ARTERY:  Patent with antegrade flow. LEFT CAROTID ARTERY: Mild left carotid bifurcation plaque. No flow limiting stenosis. LEFT VERTEBRAL ARTERY:  Patent with antegrade flow. IMPRESSION: 1. Mild bilateral carotid bifurcation atherosclerotic vascular disease. No flow limiting stenosis. 2. Vertebral arteries are patent with antegrade flow. Electronically Signed   By: Marcello Moores  Register   On: 06/11/2015 14:43   Dg Chest Portable 1 View  06/10/2015  CLINICAL DATA:  55 year old male EXAM: PORTABLE CHEST 1 VIEW COMPARISON:  None. FINDINGS: Right lung apex not entirely included on present exam. No segmental infiltrate, pulmonary edema or obvious pneumothorax. Similar appearance of hilar structures. Heart size within normal limits. Calcified aorta. Mild curvature thoracic spine convex right. IMPRESSION: No acute abnormality.  Please see above. Electronically Signed   By: Genia Del M.D.   On: 06/10/2015 18:10    EKG:   Orders placed or performed during the hospital encounter of 06/10/15  . ED EKG  . ED EKG  . EKG 12-Lead  . EKG 12-Lead      Management plans discussed with the patient, family and they are in  agreement.  CODE STATUS: Full Code.    Code Status Orders        Start     Ordered   06/10/15 2107  Full code   Continuous     06/10/15 2107    Code Status History    Date Active Date Inactive Code Status Order ID Comments User Context   01/23/2013 11:51 PM 01/27/2013  4:48 PM Full Code [id number]  Oswald Hillock, MD Inpatient   01/17/2013  6:32 AM 01/21/2013  3:22 PM Full Code [id number]  Bonnielee Haff, MD Inpatient   01/09/2013  5:04 AM 01/11/2013  2:46 PM Full Code [id number]  Jonetta Osgood, MD Inpatient   10/04/2012 12:22 AM 10/04/2012  6:34  PM Full Code [id number]  Theodis Blaze, MD ED    Advance Directive Documentation        Most Recent Value   Type of Advance Directive  Healthcare Power of Attorney, Living will   Pre-existing out of facility DNR order (yellow form or pink MOST form)     "MOST" Form in Place?        TOTAL TIME TAKING CARE OF THIS PATIENT: 35 minutes.    Vaughan Basta M.D on 06/11/2015 at 4:49 PM  Between 7am to 6pm - Pager - [phone]  After 6pm go to [email or web address] - password [email or web address] [facility]  Office  [phone]  CC: Primary care physician; [name], FNP   Note: This dictation was prepared with Dragon dictation along with smaller phrase technology. Any transcriptional errors that result from this process are unintentional.

## 2015-06-13 ENCOUNTER — Other Ambulatory Visit: Payer: Self-pay | Admitting: Family Medicine

## 2015-06-27 ENCOUNTER — Encounter: Payer: Self-pay | Admitting: Family Medicine

## 2015-06-27 ENCOUNTER — Ambulatory Visit (INDEPENDENT_AMBULATORY_CARE_PROVIDER_SITE_OTHER): Payer: Medicaid Other | Admitting: Family Medicine

## 2015-06-27 VITALS — BP 122/67 | HR 65 | Temp 97.6°F | Resp 14 | Ht 67.0 in | Wt 110.0 lb

## 2015-06-27 DIAGNOSIS — F17200 Nicotine dependence, unspecified, uncomplicated: Secondary | ICD-10-CM

## 2015-06-27 DIAGNOSIS — I10 Essential (primary) hypertension: Secondary | ICD-10-CM

## 2015-06-27 DIAGNOSIS — K860 Alcohol-induced chronic pancreatitis: Secondary | ICD-10-CM | POA: Diagnosis not present

## 2015-06-27 DIAGNOSIS — T85848S Pain due to other internal prosthetic devices, implants and grafts, sequela: Secondary | ICD-10-CM

## 2015-06-27 DIAGNOSIS — G40909 Epilepsy, unspecified, not intractable, without status epilepticus: Secondary | ICD-10-CM

## 2015-06-27 LAB — POCT URINALYSIS DIP (DEVICE)
BILIRUBIN URINE: NEGATIVE
GLUCOSE, UA: NEGATIVE mg/dL
HGB URINE DIPSTICK: NEGATIVE
Ketones, ur: NEGATIVE mg/dL
LEUKOCYTES UA: NEGATIVE
NITRITE: NEGATIVE
Protein, ur: NEGATIVE mg/dL
Specific Gravity, Urine: 1.015 (ref 1.005–1.030)
UROBILINOGEN UA: 1 mg/dL (ref 0.0–1.0)
pH: 7 (ref 5.0–8.0)

## 2015-06-27 NOTE — Progress Notes (Signed)
Subjective:    Patient ID: Marcus Brooks, male    DOB: 10/18/1960, 55 y.o.   MRN: [id number]  HPI Marcus Brooks, a 55 year old male wtiha history of seizure disorder, chronic pancreatitis, and poor nutrition presents for a post hospital follow-up. Marcus Brooks was admitted to [facility] after he developed double vision and vertigo. He was also having difficulty walking due to dizziness. He was found to have increased Dilantin levels at that time. Patient says that he feels well and is without current complaint.  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 He states that he has not had alcohol in greater than 4 months. He had a left upper quadrant PEG tube placed during one of his pancreatitis flares.  He continues to have tenderness to the PEG insertion site. He is not currently under the care of gastroenterology.  Symptoms include occasional diarrhea. Symptoms have been stable over the past year.  . Aggravating factors include having more than 3 tube feeds per day. Prior diagnostic studies include abdominal CT on 03/13/2013. According to previous records, PEG tube was placed in 2010.    Marcus Brooks also has a history of hypertension. Holding anti-hypertensive medication. Blood pressure has been at goal without medication. He does not exercise and diet is controlled on tube feedings. He does not check blood pressures at home.   Patient denies chest pain, chest pressure/discomfort, dyspnea, fatigue, irregular heart beat, palpitations, syncope and tachypnea.  Cardiovascular risk factors include  sedentary lifestyle and smoking/ tobacco exposure. Patient has been smoking 1 pack of cigarettes per day over the past 40 years.  Immunization History  Administered Date(s) Administered  . Influenza,inj,Quad PF,36+ Mos 01/10/2013  . PPD Test 01/27/2013  . Pneumococcal Polysaccharide-23 06/11/2015  . Tdap 09/12/2012   Past Surgical History  Procedure  Laterality Date  . Jejunostomy feeding tube  07/13/2010  . Pancreatic pseudocyst drainage  07/13/2010  . Eus  03/26/2009    [facility]  . Portacath placement  11/28/2008    [facility]  . Gastrostomy-jejeunostomy tube change/placement  12/11/2008    [facility]  . I&d extremity Left 09/12/2012    Procedure: IRRIGATION AND DEBRIDEMENT LEFT FIFTH FINGER;  Surgeon: Roseanne Kaufman, MD;  Location: [place];  Service: Orthopedics;  Laterality: Left;  . Open reduction internal fixation (orif) metacarpal Left 09/12/2012    Procedure: OPEN REDUCTION INTERNAL FIXATION LEFT FIFTH FINGER;  Surgeon: Roseanne Kaufman, MD;  Location: [place];  Service: Orthopedics;  Laterality: Left;  . Nerve and tendon repair Left 09/12/2012    Procedure: NERVE AND TENDON REPAIR LEFT FIFTH FINGER;  Surgeon: Roseanne Kaufman, MD;  Location: [place];  Service: Orthopedics;  Laterality: Left;  . Coronary angioplasty with stent placement    . Esophagogastroduodenoscopy N/A 01/18/2013    Procedure: ESOPHAGOGASTRODUODENOSCOPY (EGD);  Surgeon: Rogene Houston, MD;  [facility];  Service: Endoscopy;  Laterality: N/A;   Review of Systems  Constitutional: Negative.  Negative for fever and fatigue.  HENT: Negative.   Eyes: Negative.  Negative for photophobia and visual disturbance.  Respiratory: Negative.  Negative for chest tightness.   Cardiovascular: Negative.  Negative for chest pain, palpitations and leg swelling.  Gastrointestinal: Positive for diarrhea (occasional).  Endocrine: Negative.  Negative for polydipsia, polyphagia and polyuria.  Genitourinary: Negative.   Musculoskeletal: Negative.   Skin: Negative.   Allergic/Immunologic: Negative.  Negative for immunocompromised state.  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  Hematological: Negative.   Psychiatric/Behavioral: Positive for sleep disturbance.       Objective:   Physical Exam  Constitutional: He is oriented to  person, place, and time. He appears well-developed. He appears cachectic.  HENT:  Head: Normocephalic and atraumatic.  Right Ear: External ear normal.  Left Ear: External ear normal.  Nose: Nose normal.  Mouth/Throat: Oropharynx is clear and moist. Abnormal dentition (partially edentulous).  Eyes: Conjunctivae and EOM are normal. Pupils are equal, round, and reactive to light.  Neck: Trachea normal and normal range of motion. Neck supple.  Cardiovascular: Normal rate, regular rhythm, normal heart sounds and intact distal pulses.   Pulmonary/Chest: Effort normal and breath sounds normal.  Abdominal: Soft. Bowel sounds are normal.    LUG PEG tube, no erythema, pus, or edema, tender to palpation.   Musculoskeletal: Normal range of motion.  Neurological: He is oriented to person, place, and time. He has normal reflexes.  Skin: Skin is warm and dry.  Psychiatric: He has a normal mood and affect. His speech is normal and behavior is normal. Thought content normal.      BP 122/67 mmHg  Pulse 65  Temp(Src) 97.6 F (36.4 C) (Oral)  Resp 14  Ht '5\' 7"'$  (1.702 m)  Wt 110 lb (49.896 kg)  BMI 17.22 kg/m2  SpO2 100% Assessment & Plan:  1. Seizure disorder [facility]) Will send a referral to neurology for further evaluation of seizure disorder. Previous dilantin level was 28.2, will -recheck on today and monthly.  - Phenytoin level, total  2. Essential hypertension Blood pressure is at goal, will continue to hold anti-hypertensive medications.  - BASIC METABOLIC PANEL WITH GFR - Urinalysis Dipstick  3. Alcohol-induced chronic pancreatitis [facility]) Marcus Brooks says that he is not under the care of gastroenterology. He had a PEG tube placed during a previous flare of pancreatitis. Reviewed previous CT scan from 03/13/2013, pseudocysts present. He is unsure of the date that it was placed. He says that he no longer drinks alcohol. He has been sober for 4 months. He continues Creon three times daily prior to  meals. Patient warrants a referral to gastroenterology for further evaluation.  4. Pain around PEG tube site, sequela Patient continues to use a feeding syringe 3-4 times daily to give himself formula. Recommend routine skin care 1-2 times daily to prevent skin breakdown. No pus, erythema, or edema to PEG site.     5. Tobacco use disorder Smoking cessation instruction/counseling given:  counseled patient on the dangers of tobacco use, advised patient to stop smoking, and reviewed strategies to maximize success   RTC: Follow up in 1 month for dilantin level.  Will follow up by phone with laboratory results.     [name], FNP

## 2015-06-27 NOTE — Patient Instructions (Addendum)
Please schedule appointment with [facility] [phone]   Seizure, Adult A seizure is abnormal electrical activity in the brain. Seizures usually last from 30 seconds to 2 minutes. There are various types of seizures. Before a seizure, you may have a warning sensation (aura) that a seizure is about to occur. An aura may include the following symptoms:   Fear or anxiety.  Nausea.  Feeling like the room is spinning (vertigo).  Vision changes, such as seeing flashing lights or spots. Common symptoms during a seizure include:  A change in attention or behavior (altered mental status).  Convulsions with rhythmic jerking movements.  Drooling.  Rapid eye movements.  Grunting.  Loss of bladder and bowel control.  Bitter taste in the mouth.  Tongue biting. After a seizure, you may feel confused and sleepy. You may also have an injury resulting from convulsions during the seizure. HOME CARE INSTRUCTIONS   If you are given medicines, take them exactly as prescribed by your health care provider.  Keep all follow-up appointments as directed by your health care provider.  Do not swim or drive or engage in risky activity during which a seizure could cause further injury to you or others until your health care provider says it is OK.  Get adequate rest.  Teach friends and family what to do if you have a seizure. They should:  Lay you on the ground to prevent a fall.  Put a cushion under your head.  Loosen any tight clothing around your neck.  Turn you on your side. If vomiting occurs, this helps keep your airway clear.  Stay with you until you recover.  Know whether or not you need emergency care. SEEK IMMEDIATE MEDICAL CARE IF:  The seizure lasts longer than 5 minutes.  The seizure is severe or you do not wake up immediately after the seizure.  You have an altered mental status after the seizure.  You are having more frequent or worsening  seizures. Someone should drive you to the emergency department or call local emergency services (911 in [place] MAKE SURE YOU:  Understand these instructions.  Will watch your condition.  Will get help right away if you are not doing well or get worse.   This information is not intended to replace advice given to you by your health care provider. Make sure you discuss any questions you have with your health care provider.   Document Released: 12/27/1999 Document Revised: 01/19/2014 Document Reviewed: 08/10/2012 Elsevier Interactive Patient Education [employer].

## 2015-06-28 ENCOUNTER — Telehealth: Payer: Self-pay

## 2015-06-28 LAB — BASIC METABOLIC PANEL WITH GFR
BUN: 7 mg/dL (ref 7–25)
CHLORIDE: 104 mmol/L (ref 98–110)
CO2: 25 mmol/L (ref 20–31)
Calcium: 9.5 mg/dL (ref 8.6–10.3)
Creat: 0.72 mg/dL (ref 0.70–1.33)
GFR, Est African American: >89 mL/min (ref >=60)
GLUCOSE: 123 mg/dL — AB (ref 65–99)
POTASSIUM: 4.1 mmol/L (ref 3.5–5.3)
Sodium: 141 mmol/L (ref 135–146)

## 2015-06-28 LAB — PHENYTOIN LEVEL, TOTAL: Phenytoin Lvl: 20.1 ug/mL — ABNORMAL HIGH (ref 10.0–20.0)

## 2015-06-28 NOTE — Telephone Encounter (Signed)
Called, no answer. Left message to call back. Thanks!

## 2015-06-28 NOTE — Telephone Encounter (Signed)
-----   Message from [name], [place] sent at 06/28/2015  3:01 PM EDT ----- Regarding: lab results Please inform Marcus Brooks that Dilantin level is trending down, we will re-check levels next month as discussed. If any further problems develop, please call office to schedule an appt.   Thanks ----- Message -----    From: Lab in [facility]    Sent: 06/28/2015   2:54 PM      To: [name], FNP

## 2015-07-01 ENCOUNTER — Telehealth: Payer: Self-pay

## 2015-07-01 NOTE — Telephone Encounter (Signed)
Called, no answer. Left message advising of dilantin level coming down and that patient needs to keep next appointment next month for this to be re-checked. Asked if patient develops any new symptoms to call our office. Thanks!

## 2015-07-01 NOTE — Telephone Encounter (Signed)
-----   Message from [name], [place] sent at 06/28/2015  3:01 PM EDT ----- Regarding: lab results Please inform [name] that Dilantin level is trending down, we will re-check levels next month as discussed. If any further problems develop, please call office to schedule an appt.   Thanks ----- Message -----    From: Lab in [facility]    Sent: 06/28/2015   2:54 PM      To: [name], FNP

## 2015-07-02 ENCOUNTER — Telehealth: Payer: Self-pay | Admitting: *Deleted

## 2015-07-02 NOTE — Telephone Encounter (Signed)
Patient verified DOB. Patient is aware of Dilantin level trending down. Patient is aware of a recheck being completed at his visit on 07/26/15. Patient states he was unable to schedule with [facility] due to provider stating because the PEG tube was not place by [place], patient may not have it removed at the office. MA called [facility] to confirm appointment details and was told patient will have to return to [place] for removal of PEG or referred to the hospital for radiology intervention to remove the PEG. MA contacted the patient with this information from [place]. Patient will have to have his

## 2015-07-08 ENCOUNTER — Telehealth: Payer: Self-pay

## 2015-07-08 DIAGNOSIS — F17200 Nicotine dependence, unspecified, uncomplicated: Secondary | ICD-10-CM

## 2015-07-08 MED ORDER — VITAMIN C 500 MG PO TABS: 500.0000 mg | ORAL_TABLET | Freq: Every day | ORAL | Status: DC

## 2015-07-08 MED ORDER — FERROUS SULFATE 325 (65 FE) MG PO TABS: 325.0000 mg | ORAL_TABLET | Freq: Every day | ORAL | Status: DC

## 2015-07-08 NOTE — Telephone Encounter (Signed)
Refills have been sent into [facility]. Thanks!

## 2015-07-08 NOTE — Telephone Encounter (Signed)
Pharmacist, Doren Custard from [facility] called and requested medication refills for [employer]. He is asking [place] to provide a refill for [employer] Ferrous Sulfate, '325mg'$  and Vitamin C, '500mg'$ . Doren Custard can be reached at [phone]. Thanks!

## 2015-07-26 ENCOUNTER — Other Ambulatory Visit: Payer: Self-pay | Admitting: Family Medicine

## 2015-07-26 ENCOUNTER — Other Ambulatory Visit: Payer: Medicaid Other

## 2015-07-26 DIAGNOSIS — G40909 Epilepsy, unspecified, not intractable, without status epilepticus: Secondary | ICD-10-CM

## 2015-07-27 LAB — PHENYTOIN LEVEL, TOTAL: Phenytoin Lvl: 18.4 ug/mL (ref 10.0–20.0)

## 2015-08-01 ENCOUNTER — Other Ambulatory Visit: Payer: Self-pay | Admitting: Family Medicine

## 2015-08-01 DIAGNOSIS — Z431 Encounter for attention to gastrostomy: Secondary | ICD-10-CM

## 2015-08-07 ENCOUNTER — Other Ambulatory Visit: Payer: Self-pay

## 2015-08-07 ENCOUNTER — Other Ambulatory Visit: Payer: Self-pay | Admitting: Family Medicine

## 2015-08-07 ENCOUNTER — Ambulatory Visit: Payer: Medicaid Other | Admitting: Family Medicine

## 2015-08-07 DIAGNOSIS — Z931 Gastrostomy status: Secondary | ICD-10-CM

## 2015-08-14 ENCOUNTER — Ambulatory Visit (HOSPITAL_COMMUNITY)
Admission: RE | Admit: 2015-08-14 | Discharge: 2015-08-14 | Disposition: A | Discharge status: Home / Self Care | Payer: Medicaid Other | Source: Ambulatory Visit | Attending: Family Medicine | Admitting: Family Medicine

## 2015-08-14 ENCOUNTER — Encounter (HOSPITAL_COMMUNITY): Payer: Self-pay

## 2015-08-14 DIAGNOSIS — Z931 Gastrostomy status: Secondary | ICD-10-CM

## 2015-08-14 MED ORDER — LIDOCAINE VISCOUS 2 % MT SOLN
OROMUCOSAL | Status: AC
  Filled 2015-08-14: qty 15

## 2015-08-14 NOTE — Progress Notes (Signed)
Patient ID: Marcus Brooks, male   DOB: [birth year], 55 y.o.   MRN: [id number] Patient presented to radiology today for gastrostomy tube removal. According to patient he is continuing to use tube periodically for feeds. Ordering provider notified. Instructed to cancel order for tube removal at this time. Gastrostomy tube is currently intact, insertion site is clean and dry.

## 2015-08-16 ENCOUNTER — Other Ambulatory Visit: Payer: Medicaid Other

## 2015-09-27 ENCOUNTER — Ambulatory Visit (INDEPENDENT_AMBULATORY_CARE_PROVIDER_SITE_OTHER): Payer: Medicaid Other | Admitting: Family Medicine

## 2015-09-27 ENCOUNTER — Encounter: Payer: Self-pay | Admitting: Family Medicine

## 2015-09-27 VITALS — BP 121/71 | HR 71 | Temp 98.1°F | Resp 16 | Ht 67.0 in | Wt 108.0 lb

## 2015-09-27 DIAGNOSIS — Z131 Encounter for screening for diabetes mellitus: Secondary | ICD-10-CM | POA: Diagnosis not present

## 2015-09-27 DIAGNOSIS — R569 Unspecified convulsions: Secondary | ICD-10-CM | POA: Diagnosis not present

## 2015-09-27 DIAGNOSIS — K860 Alcohol-induced chronic pancreatitis: Secondary | ICD-10-CM | POA: Diagnosis not present

## 2015-09-27 LAB — CBC WITH DIFFERENTIAL/PLATELET
BASOS ABS: 96 {cells}/uL (ref 0–200)
Basophils Relative: 1 %
EOS ABS: 384 {cells}/uL (ref 15–500)
Eosinophils Relative: 4 %
HCT: 39.6 % (ref 38.5–50.0)
Hemoglobin: 13.8 g/dL (ref 13.2–17.1)
LYMPHS PCT: 27 %
Lymphs Abs: 2592 cells/uL (ref 850–3900)
MCH: 32.3 pg (ref 27.0–33.0)
MCHC: 34.8 g/dL (ref 32.0–36.0)
MCV: 92.7 fL (ref 80.0–100.0)
MONOS PCT: 7 %
MPV: 9.3 fL (ref 7.5–12.5)
Monocytes Absolute: 672 cells/uL (ref 200–950)
NEUTROS PCT: 61 %
Neutro Abs: 5856 cells/uL (ref 1500–7800)
PLATELETS: 232 10*3/uL (ref 140–400)
RBC: 4.27 MIL/uL (ref 4.20–5.80)
RDW: 14.1 % (ref 11.0–15.0)
WBC: 9.6 10*3/uL (ref 3.8–10.8)

## 2015-09-27 LAB — COMPLETE METABOLIC PANEL WITH GFR
ALT: 8 U/L — AB (ref 9–46)
AST: 15 U/L (ref 10–35)
Albumin: 4 g/dL (ref 3.6–5.1)
Alkaline Phosphatase: 93 U/L (ref 40–115)
BILIRUBIN TOTAL: 0.4 mg/dL (ref 0.2–1.2)
BUN: 4 mg/dL — ABNORMAL LOW (ref 7–25)
CO2: 23 mmol/L (ref 20–31)
CREATININE: 0.75 mg/dL (ref 0.70–1.33)
Calcium: 9 mg/dL (ref 8.6–10.3)
Chloride: 107 mmol/L (ref 98–110)
GFR, Est African American: >89 mL/min (ref >=60)
GFR, Est Non African American: >89 mL/min (ref >=60)
GLUCOSE: 109 mg/dL — AB (ref 65–99)
Potassium: 4.5 mmol/L (ref 3.5–5.3)
SODIUM: 138 mmol/L (ref 135–146)
TOTAL PROTEIN: 7 g/dL (ref 6.1–8.1)

## 2015-09-28 LAB — HEMOGLOBIN A1C
Hgb A1c MFr Bld: 4.8 % (ref <5.7)
MEAN PLASMA GLUCOSE: 91 mg/dL

## 2015-09-30 NOTE — Progress Notes (Signed)
Marcus Brooks, is a 55 y.o. male  STM:[id number]  [id number]:[id number]  DOB - 1960/01/19  CC:  Chief Complaint  Patient presents with  . Follow-up  . Hypertension       HPI: Marcus Brooks is a 55 y.o. male here for follow-up  Chronic conditions. He has a history of CAD, HTN, GERD, seizure disorder, splenic vein thrombosis,chronic pain,  His medications include amlodipine 10, Ferrous Sulfate, gabapentin, protonis and dilantin/ Other  Medications and dosages are listed in his medication list and have been reviewed. He reports smoking 1 pk a day. Denies alcohol or drug use.He reports not needing refills today.  No Known Allergies Past Medical History:  Diagnosis Date  . Anxiety states 08/04/2010  . Chronic airway obstruction, not elsewhere classified 08/04/2010  . Chronic pain syndrome 08/04/2010  . Chronic pancreatitis ([place]) 08/04/2010  . Coronary artery disease   . GERD (gastroesophageal reflux disease)   . Hiatal hernia    EGD 01/18/13  . Insomnia 08/04/2010  . Other abnormal glucose 08/04/2010  . Pancreatitis chronic 08/04/2010  . Seizure ([place]) 08/04/2010  . Splenic vein thrombosis    Chronic.  . Tobacco use disorder 08/04/2010  . Unspecified essential hypertension 08/04/2010   Current Outpatient Prescriptions on File Prior to Visit  Medication Sig Dispense Refill  . albuterol (PROVENTIL HFA;VENTOLIN HFA) 108 (90 BASE) MCG/ACT inhaler Inhale 2 puffs into the lungs every 6 (six) hours as needed for wheezing or shortness of breath.     Marland Kitchen amitriptyline (ELAVIL) 25 MG tablet Take 25 mg by mouth at bedtime.     Marland Kitchen amLODipine (NORVASC) 10 MG tablet Take 10 mg by mouth daily.    . ferrous sulfate 325 (65 FE) MG tablet Take 1 tablet (325 mg total) by mouth daily with breakfast. 30 tablet 3  . gabapentin (NEURONTIN) 300 MG capsule Take 300 mg by mouth 3 (three) times daily.     . Pancrelipase, Lip-Prot-Amyl, (CREON) 24000 units CPEP Take 48,000 Units by mouth 3 (three) times daily with  meals.    . pantoprazole (PROTONIX) 40 MG tablet TAKE 1 TABLET BY MOUTH EVERY DAY 30 tablet 3  . phenytoin (DILANTIN) 100 MG ER capsule Take 3 capsules (300 mg total) by mouth daily. 120 capsule 1  . potassium chloride SA (K-DUR,KLOR-CON) 20 MEQ tablet TAKE 2 TABLETS BY MOUTH EVERY DAY *NEED FOLLOW UP IN 1 MONTH TO CHECK LABS* 60 tablet 3  . promethazine (PHENERGAN) 25 MG tablet Take 25 mg by mouth every 6 (six) hours as needed for nausea or vomiting.     . traZODone (DESYREL) 100 MG tablet Take 200 mg by mouth at bedtime.     . vitamin C (ASCORBIC ACID) 500 MG tablet Take 1 tablet (500 mg total) by mouth daily. 30 tablet 3  . HYDROcodone-acetaminophen (NORCO/VICODIN) 5-325 MG tablet Take 2 tablets by mouth every 4 (four) hours as needed for moderate pain. (Patient not taking: Reported on 09/27/2015) 10 tablet 0  . mirtazapine (REMERON) 15 MG tablet Take 15 mg by mouth at bedtime.    . ranitidine (ZANTAC) 150 MG capsule TAKE 1 CAPSULE BY MOUTH TWICE DAILY (Patient not taking: Reported on 09/27/2015) 60 capsule 3   No current facility-administered medications on file prior to visit.    History reviewed. No pertinent family history. Social History   Social History  . Marital status: Married    Spouse name: N/A  . Number of children: N/A  . Years of education: N/A   Occupational  History  . Not on file.   Social History Main Topics  . Smoking status: Current Every Day Smoker    Packs/day: 1.00    Years: 20.00    Types: Cigarettes  . Smokeless tobacco: Never Used     Comment: 1 pack a day since age 66  . Alcohol use No     Comment: No etoh since 5-6 yrs  . Drug use: No  . Sexual activity: No   Other Topics Concern  . Not on file   Social History Narrative  . No narrative on file    Review of Systems: Constitutional: Negative Skin: Negative HENT: Negative  Eyes: Negative  Neck: Negative Respiratory: Negative Cardiovascular: Negative Gastrointestinal: Positive for occ. abd  pain, nausea, vomitiing, and heartburn. Genitourinary: Negative  Musculoskeletal: Positive for back pain Neurological: Positive for seizure disorder Hematological: Negative  Psychiatric/Behavioral: Negative    Objective:   Vitals:   09/27/15 1503  BP: 121/71  Pulse: 71  Resp: 16  Temp: 98.1 F (36.7 C)    Physical Exam: Constitutional: Patient appears well-developed and well-nourished. No distress. HENT: Normocephalic, atraumatic, External right and left ear normal. Oropharynx is clear and moist.  Eyes: Conjunctivae and EOM are normal. PERRLA, no scleral icterus. Neck: Normal ROM. Neck supple. No lymphadenopathy, No thyromegaly. CVS: RRR, S1/S2 +, no murmurs, no gallops, no rubs Pulmonary: Effort and breath sounds normal, no stridor, rhonchi, wheezes, rales.  Abdominal: Soft. Normoactive BS,, no distension, tenderness, rebound or guarding.  Musculoskeletal: Normal range of motion. No edema and no tenderness.  Neuro: Alert.Normal muscle tone coordination. Non-focal Skin: Skin is warm and dry. No rash noted. Not diaphoretic. No erythema. No pallor. Psychiatric: Normal mood and affect. Behavior, judgment, thought content normal.  Lab Results  Component Value Date   WBC 9.6 09/27/2015   HGB 13.8 09/27/2015   HCT 39.6 09/27/2015   MCV 92.7 09/27/2015   PLT 232 09/27/2015   Lab Results  Component Value Date   CREATININE 0.75 09/27/2015   BUN 4 (L) 09/27/2015   NA 138 09/27/2015   K 4.5 09/27/2015   CL 107 09/27/2015   CO2 23 09/27/2015    Lab Results  Component Value Date   HGBA1C 4.8 09/27/2015   Lipid Panel     Component Value Date/Time   CHOL 125 06/11/2015 0441   TRIG 91 06/11/2015 0441   HDL 31 (L) 06/11/2015 0441   CHOLHDL 4.0 06/11/2015 0441   VLDL 18 06/11/2015 0441   LDLCALC 76 06/11/2015 0441       Assessment and plan:   1. Alcohol-induced chronic pancreatitis (HCC)  - COMPLETE METABOLIC PANEL WITH GFR - CBC with Differential  2. Seizures  (HCC)  - Phenytoin level, free and total  3. Screening for diabetes mellitus  - Hemoglobin A1c   Return in about 6 months (around 03/26/2016).  The patient was given clear instructions to go to ER or return to medical center if symptoms don't improve, worsen or new problems develop. The patient verbalized understanding.    Micheline Chapman FNP  09/30/2015, 8:23 AM

## 2015-10-02 ENCOUNTER — Other Ambulatory Visit: Payer: Self-pay | Admitting: Family Medicine

## 2015-10-02 DIAGNOSIS — G40909 Epilepsy, unspecified, not intractable, without status epilepticus: Secondary | ICD-10-CM

## 2015-10-02 LAB — PHENYTOIN LEVEL, FREE AND TOTAL: Phenytoin, Free: 1.1 mg/L (ref 1.0–2.0)

## 2015-10-02 MED ORDER — PHENYTOIN SODIUM EXTENDED 100 MG PO CAPS: 300.0000 mg | ORAL_CAPSULE | Freq: Every day | ORAL | 1 refills | Status: DC

## 2015-10-22 ENCOUNTER — Other Ambulatory Visit: Payer: Self-pay | Admitting: Family Medicine

## 2015-10-23 ENCOUNTER — Encounter (HOSPITAL_COMMUNITY): Payer: Self-pay

## 2015-10-23 ENCOUNTER — Emergency Department (HOSPITAL_COMMUNITY)
Admission: EM | Admit: 2015-10-23 | Discharge: 2015-10-23 | Disposition: A | Discharge status: Home / Self Care | Payer: Medicaid Other | Attending: Emergency Medicine | Admitting: Emergency Medicine

## 2015-10-23 DIAGNOSIS — Z5321 Procedure and treatment not carried out due to patient leaving prior to being seen by health care provider: Secondary | ICD-10-CM | POA: Diagnosis not present

## 2015-10-23 DIAGNOSIS — R509 Fever, unspecified: Secondary | ICD-10-CM | POA: Diagnosis not present

## 2015-10-23 DIAGNOSIS — I10 Essential (primary) hypertension: Secondary | ICD-10-CM | POA: Diagnosis not present

## 2015-10-23 DIAGNOSIS — R1030 Lower abdominal pain, unspecified: Secondary | ICD-10-CM | POA: Diagnosis present

## 2015-10-23 DIAGNOSIS — F17210 Nicotine dependence, cigarettes, uncomplicated: Secondary | ICD-10-CM | POA: Diagnosis not present

## 2015-10-23 DIAGNOSIS — Z79899 Other long term (current) drug therapy: Secondary | ICD-10-CM | POA: Insufficient documentation

## 2015-10-23 DIAGNOSIS — I2510 Atherosclerotic heart disease of native coronary artery without angina pectoris: Secondary | ICD-10-CM | POA: Diagnosis not present

## 2015-10-23 LAB — COMPREHENSIVE METABOLIC PANEL
ALBUMIN: 3.9 g/dL (ref 3.5–5.0)
ALT: 92 U/L — ABNORMAL HIGH (ref 17–63)
ANION GAP: 13 (ref 5–15)
AST: 60 U/L — AB (ref 15–41)
Alkaline Phosphatase: 130 U/L — ABNORMAL HIGH (ref 38–126)
BILIRUBIN TOTAL: 0.9 mg/dL (ref 0.3–1.2)
BUN: 12 mg/dL (ref 6–20)
CHLORIDE: 99 mmol/L — AB (ref 101–111)
CO2: 23 mmol/L (ref 22–32)
Calcium: 9.3 mg/dL (ref 8.9–10.3)
Creatinine, Ser: 1.09 mg/dL (ref 0.61–1.24)
GFR calc Af Amer: >60 mL/min (ref >60)
GFR calc non Af Amer: >60 mL/min (ref >60)
GLUCOSE: 137 mg/dL — AB (ref 65–99)
POTASSIUM: 4.5 mmol/L (ref 3.5–5.1)
SODIUM: 135 mmol/L (ref 135–145)
TOTAL PROTEIN: 8.6 g/dL — AB (ref 6.5–8.1)

## 2015-10-23 LAB — URINE MICROSCOPIC-ADD ON

## 2015-10-23 LAB — CBC
HEMATOCRIT: 44.9 % (ref 39.0–52.0)
HEMOGLOBIN: 15.9 g/dL (ref 13.0–17.0)
MCH: 33.1 pg (ref 26.0–34.0)
MCHC: 35.4 g/dL (ref 30.0–36.0)
MCV: 93.5 fL (ref 78.0–100.0)
Platelets: 130 10*3/uL — ABNORMAL LOW (ref 150–400)
RBC: 4.8 MIL/uL (ref 4.22–5.81)
RDW: 13.3 % (ref 11.5–15.5)
WBC: 13.2 10*3/uL — ABNORMAL HIGH (ref 4.0–10.5)

## 2015-10-23 LAB — URINALYSIS, ROUTINE W REFLEX MICROSCOPIC
GLUCOSE, UA: NEGATIVE mg/dL
Hgb urine dipstick: NEGATIVE
KETONES UR: 15 mg/dL — AB
Nitrite: POSITIVE — AB
PH: 6 (ref 5.0–8.0)
Protein, ur: 100 mg/dL — AB
SPECIFIC GRAVITY, URINE: 1.028 (ref 1.005–1.030)

## 2015-10-23 LAB — LIPASE, BLOOD: LIPASE: 20 U/L (ref 11–51)

## 2015-10-23 LAB — I-STAT CG4 LACTIC ACID, ED: Lactic Acid, Venous: 3.05 mmol/L (ref 0.5–1.9)

## 2015-10-23 NOTE — ED Triage Notes (Signed)
I was told that mr Marcus Brooks out tired of waiting

## 2015-10-23 NOTE — ED Triage Notes (Signed)
Chg rn  Told of the pts leaving with a high lactic acid

## 2015-10-23 NOTE — ED Triage Notes (Signed)
Patient complains of lower abdominal pain with fever, vomiting and diarrhea x 2 days. Thoracic back pain with same. Also complains of dysuria with same. Has PEG tube for supplemental feedings and has not noticed any drainage from site. Alert and orienrted

## 2015-10-23 NOTE — ED Triage Notes (Signed)
Lactic acid of 3.05 reported to dr Johnney Killian  Pt already a 2  Waiting on beds to open

## 2015-11-20 ENCOUNTER — Other Ambulatory Visit: Payer: Self-pay

## 2015-11-20 DIAGNOSIS — F17200 Nicotine dependence, unspecified, uncomplicated: Secondary | ICD-10-CM

## 2015-11-20 MED ORDER — VITAMIN C 500 MG PO TABS: 500.0000 mg | ORAL_TABLET | Freq: Every day | ORAL | 3 refills | Status: DC

## 2015-11-20 MED ORDER — POTASSIUM CHLORIDE CRYS ER 20 MEQ PO TBCR: EXTENDED_RELEASE_TABLET | ORAL | 3 refills | Status: DC

## 2015-11-20 NOTE — Telephone Encounter (Signed)
Refills sent into pharmacy. Thanks!  

## 2015-11-22 ENCOUNTER — Telehealth: Payer: Self-pay | Admitting: *Deleted

## 2015-11-22 NOTE — Telephone Encounter (Signed)
Patient is requesting a refill on Gabapentin through [facility]. Patient was prescribed this in the hospital in 2014. MA informed [employer] rep of a message being routed to PCP for review and advice.

## 2015-11-25 NOTE — Telephone Encounter (Signed)
I cannot do this without seeing him. I do not even know why he is requesting.

## 2015-11-26 ENCOUNTER — Ambulatory Visit (INDEPENDENT_AMBULATORY_CARE_PROVIDER_SITE_OTHER): Payer: Medicaid Other | Admitting: Family Medicine

## 2015-11-26 VITALS — BP 112/70 | HR 65 | Temp 98.3°F | Resp 14 | Ht 67.0 in | Wt 105.0 lb

## 2015-11-26 DIAGNOSIS — Z125 Encounter for screening for malignant neoplasm of prostate: Secondary | ICD-10-CM

## 2015-11-26 DIAGNOSIS — Z1159 Encounter for screening for other viral diseases: Secondary | ICD-10-CM | POA: Diagnosis not present

## 2015-11-26 DIAGNOSIS — Z114 Encounter for screening for human immunodeficiency virus [HIV]: Secondary | ICD-10-CM

## 2015-11-26 LAB — HIV ANTIBODY (ROUTINE TESTING W REFLEX): HIV: NONREACTIVE

## 2015-11-26 LAB — PSA: PSA: 1 ng/mL (ref <=4.0)

## 2015-11-26 MED ORDER — GABAPENTIN 300 MG PO CAPS: 300.0000 mg | ORAL_CAPSULE | Freq: Three times a day (TID) | ORAL | 3 refills | Status: DC

## 2015-11-27 LAB — HEPATITIS C ANTIBODY: HCV Ab: NEGATIVE

## 2015-11-27 NOTE — Progress Notes (Signed)
Marcus Brooks, is a 55 y.o. male  PTW:[id number]  ZGY:[id number]  DOB - 1960/08/11  CC:  Chief Complaint  Patient presents with  . Peripheral Neuropathy    wants something to help with pain in legs        HPI: Marcus Brooks is a 55 y.o. male here for follow-up peripheral neuropathy.The appointment was made due to the understanding that he wanted to increase his gabapentin. However, he just wants a refill on his current dosage. We will address health maintenance needs. Today, including HIV, Hep C and PSA.   No Known Allergies Past Medical History:  Diagnosis Date  . Anxiety states 08/04/2010  . Chronic airway obstruction, not elsewhere classified 08/04/2010  . Chronic pain syndrome 08/04/2010  . Chronic pancreatitis ([place]) 08/04/2010  . Coronary artery disease   . GERD (gastroesophageal reflux disease)   . Hiatal hernia    EGD 01/18/13  . Insomnia 08/04/2010  . Other abnormal glucose 08/04/2010  . Pancreatitis chronic 08/04/2010  . Seizure ([place]) 08/04/2010  . Splenic vein thrombosis    Chronic.  . Tobacco use disorder 08/04/2010  . Unspecified essential hypertension 08/04/2010   Current Outpatient Prescriptions on File Prior to Visit  Medication Sig Dispense Refill  . albuterol (PROVENTIL HFA;VENTOLIN HFA) 108 (90 BASE) MCG/ACT inhaler Inhale 2 puffs into the lungs every 6 (six) hours as needed for wheezing or shortness of breath.     Marland Kitchen amitriptyline (ELAVIL) 25 MG tablet Take 25 mg by mouth at bedtime.     Marland Kitchen amLODipine (NORVASC) 10 MG tablet Take 10 mg by mouth daily.    . ferrous sulfate 325 (65 FE) MG tablet TAKE ONE (1) TABLET BY MOUTH EVERY DAY WITH BREAKFAST 30 tablet 3  . mirtazapine (REMERON) 15 MG tablet Take 15 mg by mouth at bedtime.    . Pancrelipase, Lip-Prot-Amyl, (CREON) 24000 units CPEP Take 48,000 Units by mouth 3 (three) times daily with meals.    . pantoprazole (PROTONIX) 40 MG tablet TAKE 1 TABLET BY MOUTH EVERY DAY 30 tablet 3  . phenytoin (DILANTIN) 100  MG ER capsule Take 3 capsules (300 mg total) by mouth daily. 120 capsule 1  . potassium chloride SA (K-DUR,KLOR-CON) 20 MEQ tablet TAKE 2 TABLETS BY MOUTH EVERY DAY *NEED FOLLOW UP IN 1 MONTH TO CHECK LABS* 60 tablet 3  . promethazine (PHENERGAN) 25 MG tablet Take 25 mg by mouth every 6 (six) hours as needed for nausea or vomiting.     . traZODone (DESYREL) 100 MG tablet Take 200 mg by mouth at bedtime.     . vitamin C (ASCORBIC ACID) 500 MG tablet Take 1 tablet (500 mg total) by mouth daily. 30 tablet 3  . HYDROcodone-acetaminophen (NORCO/VICODIN) 5-325 MG tablet Take 2 tablets by mouth every 4 (four) hours as needed for moderate pain. (Patient not taking: Reported on 09/27/2015) 10 tablet 0  . ranitidine (ZANTAC) 150 MG capsule TAKE 1 CAPSULE BY MOUTH TWICE DAILY (Patient not taking: Reported on 11/26/2015) 60 capsule 3   No current facility-administered medications on file prior to visit.    No family history on file. Social History   Social History  . Marital status: Married    Spouse name: N/A  . Number of children: N/A  . Years of education: N/A   Occupational History  . Not on file.   Social History Main Topics  . Smoking status: Current Every Day Smoker    Packs/day: 1.00    Years: 20.00  Types: Cigarettes  . Smokeless tobacco: Never Used     Comment: 1 pack a day since age 64  . Alcohol use No     Comment: No etoh since 5-6 yrs  . Drug use: No  . Sexual activity: No   Other Topics Concern  . Not on file   Social History Narrative  . No narrative on file    Review of Systems: Constitutional: Negative Skin: Negative HENT: Negative  Eyes: Negative  Neck: Negative Respiratory: Negative Cardiovascular: Negative Gastrointestinal: Negative Genitourinary: Negative  Musculoskeletal: Negative   Neurological: chronic leg pain Hematological: Negative  Psychiatric/Behavioral: Negative    Objective:   Vitals:   11/26/15 1119  BP: 112/70  Pulse: 65  Resp: 14   Temp: 98.3 F (36.8 C)    Physical Exam: Constitutional: Patient appears well-developed and well-nourished. No distress. HENT: Normocephalic, atraumatic, External right and left ear normal. Oropharynx is clear and moist.  Eyes: Conjunctivae and EOM are normal. PERRLA, no scleral icterus. Neck: Normal ROM. Neck supple. No lymphadenopathy, No thyromegaly. CVS: RRR, S1/S2 +, no murmurs, no gallops, no rubs Pulmonary: Effort and breath sounds normal, no stridor, rhonchi, wheezes, rales.  Abdominal: Soft. Normoactive BS,, no distension, tenderness, rebound or guarding.  Musculoskeletal: Normal range of motion. No edema and no tenderness.  Neuro: Alert.Normal muscle tone coordination. Non-focal Skin: Skin is warm and dry. No rash noted. Not diaphoretic. No erythema. No pallor. Psychiatric: Normal mood and affect. Behavior, judgment, thought content normal.  Lab Results  Component Value Date   WBC 13.2 (H) 10/23/2015   HGB 15.9 10/23/2015   HCT 44.9 10/23/2015   MCV 93.5 10/23/2015   PLT 130 (L) 10/23/2015   Lab Results  Component Value Date   CREATININE 1.09 10/23/2015   BUN 12 10/23/2015   NA 135 10/23/2015   K 4.5 10/23/2015   CL 99 (L) 10/23/2015   CO2 23 10/23/2015    Lab Results  Component Value Date   HGBA1C 4.8 09/27/2015   Lipid Panel     Component Value Date/Time   CHOL 125 06/11/2015 0441   TRIG 91 06/11/2015 0441   HDL 31 (L) 06/11/2015 0441   CHOLHDL 4.0 06/11/2015 0441   VLDL 18 06/11/2015 0441   LDLCALC 76 06/11/2015 0441        Assessment and plan:   1. Encounter for hepatitis C screening test for low risk patient  - Hepatitis C Antibody  2. Screening for HIV (human immunodeficiency virus)  - HIV antibody (with reflex)  3. Screening for prostate cancer  - PSA  Has routine follow-up in January with Mrs. Hollis.   The patient was given clear instructions to go to ER or return to medical center if symptoms don't improve, worsen or new  problems develop. The patient verbalized understanding.    Micheline Chapman FNP  11/27/2015, 11:35 AM

## 2015-12-18 ENCOUNTER — Other Ambulatory Visit: Payer: Self-pay | Admitting: Family Medicine

## 2015-12-18 DIAGNOSIS — G40909 Epilepsy, unspecified, not intractable, without status epilepticus: Secondary | ICD-10-CM

## 2015-12-18 DIAGNOSIS — I10 Essential (primary) hypertension: Secondary | ICD-10-CM

## 2016-01-17 ENCOUNTER — Encounter (INDEPENDENT_AMBULATORY_CARE_PROVIDER_SITE_OTHER): Payer: Self-pay | Admitting: *Deleted

## 2016-01-28 ENCOUNTER — Other Ambulatory Visit: Payer: Self-pay | Admitting: Family Medicine

## 2016-01-28 ENCOUNTER — Encounter: Payer: Self-pay | Admitting: Family Medicine

## 2016-01-28 ENCOUNTER — Ambulatory Visit (INDEPENDENT_AMBULATORY_CARE_PROVIDER_SITE_OTHER): Payer: Medicaid Other | Admitting: Family Medicine

## 2016-01-28 VITALS — BP 128/67 | HR 56 | Temp 97.5°F | Resp 14 | Ht 67.0 in | Wt 106.0 lb

## 2016-01-28 DIAGNOSIS — M545 Low back pain, unspecified: Secondary | ICD-10-CM

## 2016-01-28 DIAGNOSIS — G8929 Other chronic pain: Secondary | ICD-10-CM | POA: Diagnosis not present

## 2016-01-28 DIAGNOSIS — F17200 Nicotine dependence, unspecified, uncomplicated: Secondary | ICD-10-CM

## 2016-01-28 DIAGNOSIS — G629 Polyneuropathy, unspecified: Secondary | ICD-10-CM | POA: Diagnosis not present

## 2016-01-28 DIAGNOSIS — K860 Alcohol-induced chronic pancreatitis: Secondary | ICD-10-CM

## 2016-01-28 DIAGNOSIS — G40909 Epilepsy, unspecified, not intractable, without status epilepticus: Secondary | ICD-10-CM

## 2016-01-28 DIAGNOSIS — K9429 Other complications of gastrostomy: Secondary | ICD-10-CM

## 2016-01-28 DIAGNOSIS — I10 Essential (primary) hypertension: Secondary | ICD-10-CM | POA: Diagnosis not present

## 2016-01-28 LAB — CBC WITH DIFFERENTIAL/PLATELET
Basophils Absolute: 75 cells/uL (ref 0–200)
Basophils Relative: 1 %
Eosinophils Absolute: 300 cells/uL (ref 15–500)
Eosinophils Relative: 4 %
HEMATOCRIT: 37.3 % — AB (ref 38.5–50.0)
Hemoglobin: 12.5 g/dL — ABNORMAL LOW (ref 13.2–17.1)
LYMPHS PCT: 32 %
Lymphs Abs: 2400 cells/uL (ref 850–3900)
MCH: 31.4 pg (ref 27.0–33.0)
MCHC: 33.5 g/dL (ref 32.0–36.0)
MCV: 93.7 fL (ref 80.0–100.0)
MONO ABS: 375 {cells}/uL (ref 200–950)
MPV: 9 fL (ref 7.5–12.5)
Monocytes Relative: 5 %
NEUTROS PCT: 58 %
Neutro Abs: 4350 cells/uL (ref 1500–7800)
Platelets: 192 10*3/uL (ref 140–400)
RBC: 3.98 MIL/uL — AB (ref 4.20–5.80)
RDW: 14.4 % (ref 11.0–15.0)
WBC: 7.5 10*3/uL (ref 3.8–10.8)

## 2016-01-28 LAB — COMPLETE METABOLIC PANEL WITH GFR
ALBUMIN: 3.7 g/dL (ref 3.6–5.1)
ALK PHOS: 106 U/L (ref 40–115)
ALT: 11 U/L (ref 9–46)
AST: 20 U/L (ref 10–35)
BUN: 7 mg/dL (ref 7–25)
CALCIUM: 8.7 mg/dL (ref 8.6–10.3)
CHLORIDE: 106 mmol/L (ref 98–110)
CO2: 25 mmol/L (ref 20–31)
CREATININE: 0.71 mg/dL (ref 0.70–1.33)
GFR, Est African American: >89 mL/min (ref >=60)
GFR, Est Non African American: >89 mL/min (ref >=60)
Glucose, Bld: 101 mg/dL — ABNORMAL HIGH (ref 65–99)
Potassium: 3.9 mmol/L (ref 3.5–5.3)
Sodium: 140 mmol/L (ref 135–146)
TOTAL PROTEIN: 6.7 g/dL (ref 6.1–8.1)
Total Bilirubin: 0.3 mg/dL (ref 0.2–1.2)

## 2016-01-28 LAB — POCT URINALYSIS DIP (DEVICE)
BILIRUBIN URINE: NEGATIVE
GLUCOSE, UA: NEGATIVE mg/dL
Hgb urine dipstick: NEGATIVE
KETONES UR: NEGATIVE mg/dL
Nitrite: NEGATIVE
Protein, ur: NEGATIVE mg/dL
SPECIFIC GRAVITY, URINE: 1.02 (ref 1.005–1.030)
Urobilinogen, UA: 1 mg/dL (ref 0.0–1.0)
pH: 6.5 (ref 5.0–8.0)

## 2016-01-28 MED ORDER — KETOROLAC TROMETHAMINE 60 MG/2ML IM SOLN
30.0000 mg | Freq: Once | INTRAMUSCULAR | Status: AC
  Administered 2016-01-28: 30 mg via INTRAMUSCULAR

## 2016-01-28 MED ORDER — GABAPENTIN 300 MG PO CAPS: 300.0000 mg | ORAL_CAPSULE | Freq: Four times a day (QID) | ORAL | 3 refills | Status: DC | PRN

## 2016-01-28 MED ORDER — DULOXETINE HCL 20 MG PO CPEP: 20.0000 mg | ORAL_CAPSULE | Freq: Two times a day (BID) | ORAL | 3 refills | Status: DC

## 2016-01-28 NOTE — Progress Notes (Signed)
Subjective:    Patient ID: Marcus Brooks, male    DOB: 1960/10/06, 56 y.o.   MRN: [id number]  HPI Marcus Brooks, a 56 year old male wtiha history of seizure disorder, chronic pancreatitis, and poor nutrition presents for a follow up of chronic conditions. Marcus Brooks has a history of seizure disorder. Seizures are controlled on Dilantin. He has been taking medication consistently. He says that it has been greater than 6 months since last seizure activity.    He is complaining of worsening neuropathy. Neuropathy is primarily in the left lower extremity. He has been taking Gabapentin 300 mg TID consistently without sustained relief.   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 He states that he has not had alcohol in greater than 1 year. He had a left upper quadrant PEG tube placed during one of his pancreatitis flares.  He continues to have tenderness to the PEG insertion site. He is not currently under the care of gastroenterology.  Symptoms include occasional diarrhea and vomiting. He also has irritation around his peg site.  Symptoms have been stable over the past year.  . Aggravating factors include having more than 3 tube feeds per day. Prior diagnostic studies include abdominal CT on 03/13/2013. According to previous records, PEG tube was placed in 2010.    Marcus Brooks also has a history of hypertension. Blood pressure has been at goal without medication. He does not exercise and diet is controlled on tube feedings. He does not check blood pressures at home.   Patient denies chest pain, chest pressure/discomfort, dyspnea, fatigue, irregular heart beat, palpitations, syncope and tachypnea.  Cardiovascular risk factors include  sedentary lifestyle and smoking/ tobacco exposure. Patient has been smoking 1 pack of cigarettes per day over the past 40 years.  Immunization History  Administered Date(s) Administered  . Influenza,inj,Quad PF,36+ Mos 01/10/2013  . PPD Test  01/27/2013  . Pneumococcal Polysaccharide-23 06/11/2015  . Tdap 09/12/2012   Past Surgical History:  Procedure Laterality Date  . CORONARY ANGIOPLASTY WITH STENT PLACEMENT    . ESOPHAGOGASTRODUODENOSCOPY N/A 01/18/2013   Procedure: ESOPHAGOGASTRODUODENOSCOPY (EGD);  Surgeon: Rogene Houston, MD;  [facility];  Service: Endoscopy;  Laterality: N/A;  . EUS  03/26/2009   [facility]  . GASTROSTOMY-JEJEUNOSTOMY TUBE CHANGE/PLACEMENT  12/11/2008   [facility]  . I&D EXTREMITY Left 09/12/2012   Procedure: IRRIGATION AND DEBRIDEMENT LEFT FIFTH FINGER;  Surgeon: Roseanne Kaufman, MD;  Location: [facility];  Service: Orthopedics;  Laterality: Left;  . JEJUNOSTOMY FEEDING TUBE  07/13/2010  . NERVE AND TENDON REPAIR Left 09/12/2012   Procedure: NERVE AND TENDON REPAIR LEFT FIFTH FINGER;  Surgeon: Roseanne Kaufman, MD;  Location: [facility];  Service: Orthopedics;  Laterality: Left;  . OPEN REDUCTION INTERNAL FIXATION (ORIF) METACARPAL Left 09/12/2012   Procedure: OPEN REDUCTION INTERNAL FIXATION LEFT FIFTH FINGER;  Surgeon: Roseanne Kaufman, MD;  Location: [facility];  Service: Orthopedics;  Laterality: Left;  . PANCREATIC PSEUDOCYST DRAINAGE  07/13/2010  . PORTACATH PLACEMENT  11/28/2008   [facility]   Review of Systems  Constitutional: Negative.  Negative for fatigue and fever.  HENT: Negative.   Eyes: Negative.  Negative for photophobia and visual disturbance.  Respiratory: Negative.  Negative for chest tightness.   Cardiovascular: Negative.  Negative for chest pain, palpitations and leg swelling.  Gastrointestinal: Positive for abdominal pain, diarrhea (occasional) and nausea.  Endocrine: Negative.  Negative for polydipsia, polyphagia and polyuria.  Genitourinary: Negative.   Musculoskeletal:  Negative.   Skin: Negative.   Allergic/Immunologic: Negative.  Negative for immunocompromised state.  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   Hematological: Negative.   Psychiatric/Behavioral: Positive for sleep disturbance.       Objective:   Physical Exam  Constitutional: He is oriented to person, place, and time. He appears well-developed. He appears cachectic.  HENT:  Head: Normocephalic and atraumatic.  Right Ear: External ear normal.  Left Ear: External ear normal.  Nose: Nose normal.  Mouth/Throat: Oropharynx is clear and moist. Abnormal dentition (partially edentulous).  Eyes: Conjunctivae and EOM are normal. Pupils are equal, round, and reactive to light.  Neck: Trachea normal and normal range of motion. Neck supple.  Cardiovascular: Normal rate, regular rhythm, normal heart sounds and intact distal pulses.   Pulmonary/Chest: Effort normal and breath sounds normal.  Abdominal: Soft. Bowel sounds are normal. There is tenderness in the right upper quadrant and left upper quadrant.  LUG PEG tube, pus, or edema, tender to palpation.  Mild erythema  Musculoskeletal: Normal range of motion.  Neurological: He is oriented to person, place, and time. He has normal reflexes.  Skin: Skin is warm and dry.  Psychiatric: He has a normal mood and affect. His speech is normal and behavior is normal. Thought content normal.      BP 128/67 (BP Location: Right Arm, Patient Position: Sitting, Cuff Size: Small)   Pulse (!) 56   Temp 97.5 F (36.4 C) (Oral)   Resp 14   Ht '5\' 7"'$  (1.702 m)   Wt 106 lb (48.1 kg)   SpO2 100%   BMI 16.60 kg/m  Assessment & Plan:  1. Essential hypertension Blood pressure is at goal on current medication regimen.  - COMPLETE METABOLIC PANEL WITH GFR - CBC with Differential  2. Seizure disorder ([facility]) - Phenytoin level, total and free  3. Neuropathy ([place]) Will continue gabapentin.  - DULoxetine (CYMBALTA) 20 MG capsule; Take 1 capsule (20 mg total) by mouth 2 (two) times daily.  Dispense: 60 capsule; Refill: 3  4. Irritation around percutaneous endoscopic gastrostomy (PEG) tube site  [facility]) Patient continues to use a feeding syringe 3-4 times daily to give himself formula. Recommend routine skin care 1-2 times daily to prevent skin breakdown. Mild erythema. No pus or edema to PEG site.  - Ambulatory referral to Gastroenterology  5. Alcohol-induced chronic pancreatitis [facility]) Marcus Brooks says that he is not under the care of gastroenterology. He had a PEG tube placed during a previous flare of pancreatitis. Reviewed previous CT scan from 03/13/2013, pseudocysts present. He is unsure of the date that it was placed. He says that he no longer drinks alcohol. He has been sober for 1 year. He continues Creon three times daily prior to meals. Patient warrants a referral to gastroenterology for further evaluation. - Ambulatory referral to Gastroenterology  6. Chronic low back pain without sciatica, unspecified back pain laterality - ketorolac (TORADOL) injection 30 mg; Inject 1 mL (30 mg total) into the muscle once.  7. Tobacco use disorder Smoking cessation instruction/counseling given:  counseled patient on the dangers of tobacco use, advised patient to stop smoking, and reviewed strategies to maximize success   RTC: Follow up in 1 month for chronic pain.  Will follow up by phone with laboratory results.     Avrie Kedzierski M, FNP    The patient was given clear instructions to go to ER or return to medical center if symptoms do not improve,  worsen or new problems develop. The patient verbalized understanding. Will notify patient with laboratory results.

## 2016-01-28 NOTE — Patient Instructions (Signed)
Increased Gabapentin to 300 mg 4 times per day Started Cymbalta 20 mg twice daily for worsening neuropathy. Will follow up in office in 1 month Sent GI referral to evaluate PEG tube. Will review medical records

## 2016-01-31 ENCOUNTER — Telehealth: Payer: Self-pay

## 2016-02-01 LAB — PHENYTOIN LEVEL, FREE

## 2016-02-03 LAB — PHENYTOIN LEVEL, TOTAL: Phenytoin Lvl: 9.3 ug/mL — ABNORMAL LOW (ref 10.0–20.0)

## 2016-02-12 ENCOUNTER — Other Ambulatory Visit: Payer: Self-pay

## 2016-02-12 MED ORDER — FERROUS SULFATE 325 (65 FE) MG PO TABS: ORAL_TABLET | ORAL | 3 refills | Status: DC

## 2016-02-12 MED ORDER — AMITRIPTYLINE HCL 25 MG PO TABS: 25.0000 mg | ORAL_TABLET | Freq: Every day | ORAL | 3 refills | Status: DC

## 2016-02-12 MED ORDER — TRAZODONE HCL 100 MG PO TABS: 200.0000 mg | ORAL_TABLET | Freq: Every day | ORAL | 3 refills | Status: DC

## 2016-02-19 ENCOUNTER — Other Ambulatory Visit (INDEPENDENT_AMBULATORY_CARE_PROVIDER_SITE_OTHER): Payer: Self-pay | Admitting: *Deleted

## 2016-02-19 ENCOUNTER — Telehealth (INDEPENDENT_AMBULATORY_CARE_PROVIDER_SITE_OTHER): Payer: Self-pay | Admitting: *Deleted

## 2016-02-19 ENCOUNTER — Emergency Department: Payer: Medicaid Other

## 2016-02-19 ENCOUNTER — Emergency Department
Admission: EM | Admit: 2016-02-19 | Discharge: 2016-02-19 | Disposition: A | Discharge status: Home / Self Care | Payer: Medicaid Other | Attending: Emergency Medicine | Admitting: Emergency Medicine

## 2016-02-19 ENCOUNTER — Encounter (INDEPENDENT_AMBULATORY_CARE_PROVIDER_SITE_OTHER): Payer: Self-pay | Admitting: *Deleted

## 2016-02-19 DIAGNOSIS — Z431 Encounter for attention to gastrostomy: Secondary | ICD-10-CM | POA: Insufficient documentation

## 2016-02-19 DIAGNOSIS — Z79899 Other long term (current) drug therapy: Secondary | ICD-10-CM | POA: Insufficient documentation

## 2016-02-19 DIAGNOSIS — I2510 Atherosclerotic heart disease of native coronary artery without angina pectoris: Secondary | ICD-10-CM | POA: Diagnosis not present

## 2016-02-19 DIAGNOSIS — Z860100 Personal history of colon polyps, unspecified: Secondary | ICD-10-CM | POA: Insufficient documentation

## 2016-02-19 DIAGNOSIS — Z4659 Encounter for fitting and adjustment of other gastrointestinal appliance and device: Secondary | ICD-10-CM

## 2016-02-19 DIAGNOSIS — F17210 Nicotine dependence, cigarettes, uncomplicated: Secondary | ICD-10-CM | POA: Diagnosis not present

## 2016-02-19 DIAGNOSIS — I10 Essential (primary) hypertension: Secondary | ICD-10-CM | POA: Diagnosis not present

## 2016-02-19 MED ORDER — PEG 3350-KCL-NA BICARB-NACL 420 G PO SOLR: 4000.0000 mL | Freq: Once | ORAL | 0 refills | Status: AC

## 2016-02-19 MED ORDER — DIATRIZOATE MEGLUMINE & SODIUM 66-10 % PO SOLN
60.0000 mL | Freq: Once | ORAL | Status: AC
  Administered 2016-02-19: 60 mL

## 2016-02-19 NOTE — ED Triage Notes (Signed)
Pt states his feeding tube came out this morning.. Pt has had the tube for about 16years

## 2016-02-19 NOTE — Telephone Encounter (Signed)
Patient need trilyte 

## 2016-02-19 NOTE — ED Provider Notes (Signed)
[facility] Emergency Department Provider Note  ____________________________________________  Time seen: Approximately 4:19 PM  I have reviewed the triage vital signs and the nursing notes.   HISTORY  Chief Complaint feeding tube displaced   HPI Marcus Brooks is a 56 y.o. male a history of chronic pancreatitis who presents for evaluation of dislodged feeding tube. Patient has had a feeding tube for 16 years. He uses the feeding tube 3 times a day for ensure for weight gain. Tube became dislodge sometime yesterday. Patient not sure when. Last changed 1 year ago. Patient is able to eat and drink orally as well and G-tube is only use for supplementation.  Past Medical History:  Diagnosis Date  . Anxiety states 08/04/2010  . Chronic airway obstruction, not elsewhere classified 08/04/2010  . Chronic pain syndrome 08/04/2010  . Chronic pancreatitis ([place]) 08/04/2010  . Coronary artery disease   . GERD (gastroesophageal reflux disease)   . Hiatal hernia    EGD 01/18/13  . Insomnia 08/04/2010  . Other abnormal glucose 08/04/2010  . Pancreatitis chronic 08/04/2010  . Seizure ([place]) 08/04/2010  . Splenic vein thrombosis    Chronic.  . Tobacco use disorder 08/04/2010  . Unspecified essential hypertension 08/04/2010    Patient Active Problem List   Diagnosis Date Noted  . History of colonic polyps 02/19/2016  . Dizziness 06/10/2015  . Pancreatic pseudocyst 03/13/2013  . Cellulitis 01/24/2013  . Gastritis 01/24/2013  . Pancreatitis 01/23/2013  . Irritation around percutaneous endoscopic gastrostomy (PEG) tube site ([place]) 01/23/2013  . Hyponatremia 01/19/2013  . Hiatal hernia 01/19/2013  . Acute blood loss anemia 01/18/2013  . 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 Coronary artery disease   . Upper GI bleed 01/17/2013  . Chronic pancreatitis ([place]) 01/17/2013  . Protein-calorie malnutrition, severe ([place]) 01/10/2013  . Hematemesis 01/09/2013  .  Pancreatitis, acute 01/09/2013  . Seizure disorder ([place]) 01/09/2013  . Syncope 01/09/2013  . Acute pancreatitis 10/03/2012  . Leukocytosis 10/03/2012  . Transaminitis 10/03/2012  . Chest pain 10/03/2012  . Pain around PEG tube site 03/09/2011  . Hypertension 11/05/2010  . GERD (gastroesophageal reflux disease) 11/05/2010  . Chronic pain syndrome 08/04/2010  . Tobacco use disorder 08/04/2010    Past Surgical History:  Procedure Laterality Date  . CORONARY ANGIOPLASTY WITH STENT PLACEMENT    . ESOPHAGOGASTRODUODENOSCOPY N/A 01/18/2013   Procedure: ESOPHAGOGASTRODUODENOSCOPY (EGD);  Surgeon: Rogene Houston, MD;  [facility];  Service: Endoscopy;  Laterality: N/A;  . EUS  03/26/2009   [facility]  . GASTROSTOMY-JEJEUNOSTOMY TUBE CHANGE/PLACEMENT  12/11/2008   MICHAEL SHICK  . I&D EXTREMITY Left 09/12/2012   Procedure: IRRIGATION AND DEBRIDEMENT LEFT FIFTH FINGER;  Surgeon: Roseanne Kaufman, MD;  Location: [place];  Service: Orthopedics;  Laterality: Left;  . JEJUNOSTOMY FEEDING TUBE  07/13/2010  . NERVE AND TENDON REPAIR Left 09/12/2012   Procedure: NERVE AND TENDON REPAIR LEFT FIFTH FINGER;  Surgeon: Roseanne Kaufman, MD;  Location: [place];  Service: Orthopedics;  Laterality: Left;  . OPEN REDUCTION INTERNAL FIXATION (ORIF) METACARPAL Left 09/12/2012   Procedure: OPEN REDUCTION INTERNAL FIXATION LEFT FIFTH FINGER;  Surgeon: Roseanne Kaufman, MD;  Location: [place];  Service: Orthopedics;  Laterality: Left;  . PANCREATIC PSEUDOCYST DRAINAGE  07/13/2010  . PORTACATH PLACEMENT  11/28/2008   FLEISHMAN    Prior to Admission medications   Medication Sig Start Date End Date Taking? Authorizing Provider  albuterol (PROVENTIL HFA;VENTOLIN HFA) 108 (90 BASE) MCG/ACT inhaler Inhale 2 puffs into the  lungs every 6 (six) hours as needed for wheezing or shortness of breath.     Historical Provider, MD  amitriptyline (ELAVIL) 25 MG tablet Take 1 tablet (25 mg total) by mouth at bedtime. 02/12/16   Micheline Chapman, NP  amLODipine (NORVASC) 10 MG tablet TAKE ONE (1) TABLET BY MOUTH EVERY DAY 12/19/15   Micheline Chapman, NP  CREON [phone] units CPEP TAKE TWO CAPSULES BY MOUTH BEFORE MEALS 12/19/15   Micheline Chapman, NP  DULoxetine (CYMBALTA) 20 MG capsule Take 1 capsule (20 mg total) by mouth 2 (two) times daily. 01/28/16   [name], FNP  ferrous sulfate 325 (65 FE) MG tablet TAKE ONE (1) TABLET BY MOUTH EVERY DAY WITH BREAKFAST 02/12/16   Micheline Chapman, NP  gabapentin (NEURONTIN) 300 MG capsule Take 1 capsule (300 mg total) by mouth 4 (four) times daily as needed. 01/28/16   [name], FNP  HYDROcodone-acetaminophen (NORCO/VICODIN) 5-325 MG tablet Take 2 tablets by mouth every 4 (four) hours as needed for moderate pain. Patient not taking: Reported on 01/28/2016 12/24/14   Orpah Greek, MD  mirtazapine (REMERON) 15 MG tablet Take 15 mg by mouth at bedtime.    Historical Provider, MD  pantoprazole (PROTONIX) 40 MG tablet TAKE ONE (1) TABLET BY MOUTH EVERY DAY 12/19/15   Micheline Chapman, NP  phenytoin (DILANTIN) 100 MG ER capsule TAKE THREE CAPSULES BY MOUTH DAILY 12/19/15   Micheline Chapman, NP  polyethylene glycol-electrolytes (NULYTELY/GOLYTELY) 420 g solution Take 4,000 mLs by mouth once. 02/19/16 02/19/16  Butch Penny, NP  potassium chloride SA (K-DUR,KLOR-CON) 20 MEQ tablet TAKE 2 TABLETS BY MOUTH EVERY DAY *NEED FOLLOW UP IN 1 MONTH TO CHECK LABS* 11/20/15   Micheline Chapman, NP  promethazine (PHENERGAN) 25 MG tablet Take 25 mg by mouth every 6 (six) hours as needed for nausea or vomiting.     Historical Provider, MD  ranitidine (ZANTAC) 150 MG capsule TAKE 1 CAPSULE BY MOUTH TWICE DAILY Patient not taking: Reported on 01/28/2016 06/13/15   Micheline Chapman, NP  traZODone (DESYREL) 100 MG tablet Take 2 tablets (200 mg total) by mouth at bedtime. 02/12/16   Micheline Chapman, NP  vitamin C (ASCORBIC ACID) 500 MG tablet Take 1 tablet (500 mg total) by mouth daily.  11/20/15   Micheline Chapman, NP    Allergies Patient has no known allergies.  No family history on file.  Social History Social History  Substance Use Topics  . Smoking status: Current Every Day Smoker    Packs/day: 1.00    Years: 20.00    Types: Cigarettes  . Smokeless tobacco: Never Used     Comment: 1 pack a day since age 68  . Alcohol use No     Comment: No etoh since 5-6 yrs    Review of Systems  Constitutional: Negative for fever. Eyes: Negative for visual changes. ENT: Negative for sore throat. Neck: No neck pain  Cardiovascular: Negative for chest pain. Respiratory: Negative for shortness of breath. Gastrointestinal: Negative for abdominal pain, vomiting or diarrhea. + dislodge G-tube Genitourinary: Negative for dysuria. Musculoskeletal: Negative for back pain. Skin: Negative for rash. Neurological: Negative for headaches, weakness or numbness. Psych: No SI or HI  ____________________________________________   PHYSICAL EXAM:  VITAL SIGNS: ED Triage Vitals  Enc Vitals Group     BP 02/19/16 1241 131/63     Pulse Rate 02/19/16 1241 66     Resp 02/19/16 1241  18     Temp 02/19/16 1241 97.8 F (36.6 C)     Temp Source 02/19/16 1241 Oral     SpO2 02/19/16 1241 98 %     Weight 02/19/16 1242 100 lb (45.4 kg)     Height 02/19/16 1242 '5\' 7"'$  (1.702 m)     Head Circumference --      Peak Flow --      Pain Score 02/19/16 1242 8     Pain Loc --      Pain Edu? --      Excl. in [place]? --     Constitutional: Alert and oriented. Well appearing and in no apparent distress. HEENT:      Head: Normocephalic and atraumatic.         Eyes: Conjunctivae are normal. Sclera is non-icteric. EOMI. PERRL      Mouth/Throat: Mucous membranes are moist.       Neck: Supple with no signs of meningismus. Cardiovascular: Regular rate and rhythm. No murmurs, gallops, or rubs. 2+ symmetrical distal pulses are present in all extremities. No JVD. Respiratory: Normal respiratory effort.  Lungs are clear to auscultation bilaterally. No wheezes, crackles, or rhonchi.  Gastrointestinal: Soft, non tender, and non distended with positive bowel sounds. No rebound or guarding. G-tube ostomy with no g-tube in place Musculoskeletal: Nontender with normal range of motion in all extremities. No edema, cyanosis, or erythema of extremities. Neurologic: Normal speech and language. Face is symmetric. Moving all extremities. No gross focal neurologic deficits are appreciated. Skin: Skin is warm, dry and intact. No rash noted. Psychiatric: Mood and affect are normal. Speech and behavior are normal.  ____________________________________________   LABS (all labs ordered are listed, but only abnormal results are displayed)  Labs Reviewed - No data to display ____________________________________________  EKG  none ____________________________________________  RADIOLOGY  none  ____________________________________________   PROCEDURES  Procedure(s) performed: yes Gastrostomy tube replacement Date/Time: 02/19/2016 7:17 PM Performed by: Rudene Re Authorized by: Rudene Re  Consent: Verbal consent obtained. Risks and benefits: risks, benefits and alternatives were discussed Consent given by: patient Patient identity confirmed: verbally with patient Time out: Immediately prior to procedure a "time out" was called to verify the correct patient, procedure, equipment, support staff and site/side marked as required. Preparation: Patient was prepped and draped in the usual sterile fashion. Local anesthesia used: no  Anesthesia: Local anesthesia used: no  Sedation: Patient sedated: no Patient tolerance: Patient tolerated the procedure well with no immediate complications    Critical Care performed:  None ____________________________________________   INITIAL IMPRESSION / ASSESSMENT AND PLAN / ED COURSE  56 y.o. male a history of chronic pancreatitis who presents  for evaluation of dislodged feeding tube since yesterday. Unfortunately I was unable to put a 22 [place] G-tube however was able to put a 16 [place] tube. Upper GI series showing that the tube is appropriately in the stomach. Patient be discharged home at this time.     Pertinent labs & imaging results that were available during my care of the patient were reviewed by me and considered in my medical decision making (see chart for details).    ____________________________________________   FINAL CLINICAL IMPRESSION(S) / ED DIAGNOSES  Final diagnoses:  Encounter for feeding tube placement      NEW MEDICATIONS STARTED DURING THIS VISIT:  New Prescriptions   No medications on file     Note:  This document was prepared using Dragon voice recognition software and may include unintentional dictation errors.  Rudene Re, MD 02/19/16 [phone]

## 2016-02-19 NOTE — ED Notes (Signed)
Dr. Alfred Levins placed a 31 F kangaroo feeding tube. Balloon blown up with 57m water. Will verify placement.   Pt states 252F kangaroo feeding tube came out last night around 10pm, states he heard a pop. Area is red around insertion hole.

## 2016-02-28 ENCOUNTER — Ambulatory Visit (INDEPENDENT_AMBULATORY_CARE_PROVIDER_SITE_OTHER): Payer: Medicaid Other | Admitting: Family Medicine

## 2016-02-28 ENCOUNTER — Telehealth (INDEPENDENT_AMBULATORY_CARE_PROVIDER_SITE_OTHER): Payer: Self-pay | Admitting: *Deleted

## 2016-02-28 VITALS — BP 120/64 | HR 69 | Temp 97.9°F | Resp 18 | Ht 67.0 in | Wt 104.2 lb

## 2016-02-28 DIAGNOSIS — K9429 Other complications of gastrostomy: Secondary | ICD-10-CM

## 2016-02-28 DIAGNOSIS — T7840XA Allergy, unspecified, initial encounter: Secondary | ICD-10-CM | POA: Diagnosis not present

## 2016-02-28 DIAGNOSIS — M5440 Lumbago with sciatica, unspecified side: Secondary | ICD-10-CM | POA: Diagnosis not present

## 2016-02-28 DIAGNOSIS — F17200 Nicotine dependence, unspecified, uncomplicated: Secondary | ICD-10-CM

## 2016-02-28 DIAGNOSIS — K9422 Gastrostomy infection: Secondary | ICD-10-CM | POA: Diagnosis not present

## 2016-02-28 DIAGNOSIS — G40909 Epilepsy, unspecified, not intractable, without status epilepticus: Secondary | ICD-10-CM | POA: Diagnosis not present

## 2016-02-28 LAB — CBC WITH DIFFERENTIAL/PLATELET
BASOS ABS: 84 {cells}/uL (ref 0–200)
Basophils Relative: 1 %
EOS PCT: 4 %
Eosinophils Absolute: 336 cells/uL (ref 15–500)
HCT: 36.7 % — ABNORMAL LOW (ref 38.5–50.0)
HEMOGLOBIN: 12.6 g/dL — AB (ref 13.2–17.1)
LYMPHS PCT: 22 %
Lymphs Abs: 1848 cells/uL (ref 850–3900)
MCH: 31.6 pg (ref 27.0–33.0)
MCHC: 34.3 g/dL (ref 32.0–36.0)
MCV: 92 fL (ref 80.0–100.0)
MPV: 8.7 fL (ref 7.5–12.5)
Monocytes Absolute: 588 cells/uL (ref 200–950)
Monocytes Relative: 7 %
NEUTROS PCT: 66 %
Neutro Abs: 5544 cells/uL (ref 1500–7800)
Platelets: 201 10*3/uL (ref 140–400)
RBC: 3.99 MIL/uL — AB (ref 4.20–5.80)
RDW: 14 % (ref 11.0–15.0)
WBC: 8.4 10*3/uL (ref 3.8–10.8)

## 2016-02-28 MED ORDER — HYDROCODONE-ACETAMINOPHEN 5-325 MG PO TABS: 1.0000 | ORAL_TABLET | Freq: Four times a day (QID) | ORAL | 0 refills | Status: DC | PRN

## 2016-02-28 MED ORDER — TRIAMCINOLONE ACETONIDE 40 MG/ML IJ SUSP
40.0000 mg | Freq: Once | INTRAMUSCULAR | Status: AC
  Administered 2016-02-28: 40 mg via INTRAMUSCULAR

## 2016-02-28 MED ORDER — CEPHALEXIN 500 MG PO CAPS: 500.0000 mg | ORAL_CAPSULE | Freq: Two times a day (BID) | ORAL | 0 refills | Status: DC

## 2016-02-28 MED ORDER — HYDROCORTISONE 0.5 % EX CREA: 1.0000 "application " | TOPICAL_CREAM | Freq: Two times a day (BID) | CUTANEOUS | 0 refills | Status: DC

## 2016-02-28 MED ORDER — CETIRIZINE HCL 10 MG PO TABS: 10.0000 mg | ORAL_TABLET | Freq: Every day | ORAL | 11 refills | Status: DC

## 2016-02-28 NOTE — Patient Instructions (Addendum)
Norco 5-325 every 6 hours as needed for severe pain. Do not drink alcohol, drive, or operate machinery while taking medication.   Patient given a steroid injection (Kenolog for reaction to adhesive)  Used Medipore tape around Peg site  Peg site infection. Keflex 500 mg twice daily with food.       Contact Dermatitis Introduction Dermatitis is redness, soreness, and swelling (inflammation) of the skin. Contact dermatitis is a reaction to certain substances that touch the skin. There are two types of contact dermatitis:  Irritant contact dermatitis. This type is caused by something that irritates your skin, such as dry hands from washing them too much. This type does not require previous exposure to the substance for a reaction to occur. This type is more common.  Allergic contact dermatitis. This type is caused by a substance that you are allergic to, such as a nickel allergy or poison ivy. This type only occurs if you have been exposed to the substance (allergen) before. Upon a repeat exposure, your body reacts to the substance. This type is less common. What are the causes? Many different substances can cause contact dermatitis. Irritant contact dermatitis is most commonly caused by exposure to:  Makeup.  Soaps.  Detergents.  Bleaches.  Acids.  Metal salts, such as nickel. Allergic contact dermatitis is most commonly caused by exposure to:  Poisonous plants.  Chemicals.  Jewelry.  Latex.  Medicines.  Preservatives in products, such as clothing. What increases the risk? This condition is more likely to develop in:  People who have jobs that expose them to irritants or allergens.  People who have certain medical conditions, such as asthma or eczema. What are the signs or symptoms? Symptoms of this condition may occur anywhere on your body where the irritant has touched you or is touched by you. Symptoms include:  Dryness or  flaking.  Redness.  Cracks.  Itching.  Pain or a burning feeling.  Blisters.  Drainage of small amounts of blood or clear fluid from skin cracks. With allergic contact dermatitis, there may also be swelling in areas such as the eyelids, mouth, or genitals. How is this diagnosed? This condition is diagnosed with a medical history and physical exam. A patch skin test may be performed to help determine the cause. If the condition is related to your job, you may need to see an occupational medicine specialist. How is this treated? Treatment for this condition includes figuring out what caused the reaction and protecting your skin from further contact. Treatment may also include:  Steroid creams or ointments. Oral steroid medicines may be needed in more severe cases.  Antibiotics or antibacterial ointments, if a skin infection is present.  Antihistamine lotion or an antihistamine taken by mouth to ease itching.  A bandage (dressing). Follow these instructions at home: [place] your skin as needed.  Apply cool compresses to the affected areas.  Try taking a bath with:  Epsom salts. Follow the instructions on the packaging. You can get these at your local pharmacy or grocery store.  Baking soda. Pour a small amount into the bath as directed by your health care provider.  Colloidal oatmeal. Follow the instructions on the packaging. You can get this at your local pharmacy or grocery store.  Try applying baking soda paste to your skin. Stir water into baking soda until it reaches a paste-like consistency.  Do not scratch your skin.  Bathe less frequently, such as every other day.  Bathe in lukewarm  water. Avoid using hot water. Medicines  Take or apply over-the-counter and prescription medicines only as told by your health care provider.  If you were prescribed an antibiotic medicine, take or apply your antibiotic as told by your health care provider. Do not  stop using the antibiotic even if your condition starts to improve. General instructions  Keep all follow-up visits as told by your health care provider. This is important.  Avoid the substance that caused your reaction. If you do not know what caused it, keep a journal to try to track what caused it. Write down:  What you eat.  What cosmetic products you use.  What you drink.  What you wear in the affected area. This includes jewelry.  If you were given a dressing, take care of it as told by your health care provider. This includes when to change and remove it. Contact a health care provider if:  Your condition does not improve with treatment.  Your condition gets worse.  You have signs of infection such as swelling, tenderness, redness, soreness, or warmth in the affected area.  You have a fever.  You have new symptoms. Get help right away if:  You have a severe headache, neck pain, or neck stiffness.  You vomit.  You feel very sleepy.  You notice red streaks coming from the affected area.  Your bone or joint underneath the affected area becomes painful after the skin has healed.  The affected area turns darker.  You have difficulty breathing. This information is not intended to replace advice given to you by your health care provider. Make sure you discuss any questions you have with your health care provider. Document Released: 12/27/1999 Document Revised: 06/06/2015 Document Reviewed: 05/16/2014  2017 Elsevier

## 2016-02-28 NOTE — Progress Notes (Signed)
Subjective:    Patient ID: Marcus Brooks, male    DOB: 10-20-1960, 56 y.o.   MRN: [id number] Marcus Brooks, a 56 year old male wtiha history of seizure disorder, chronic pancreatitis, and poor nutrition presents for a follow up of chronic conditions. He was recently evaluated in the emergency department for displacement of Left lower quadrant peg tube. He presents complaining of a rash primarily to abdomen. Marcus Brooks has been using tape to skin to keep dressing in place. He says that rash worsened after discharge. . Marcus Brooks has a history of seizure disorder. Seizures are controlled on Dilantin. He has been taking medication consistently. He says that it has been greater than 6 months since last seizure activity.   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 He states that he has not had alcohol in greater than 1 year. He had a left upper quadrant PEG tube placed during one of his pancreatitis flares.  He continues to have tenderness to the PEG insertion site. He is not currently under the care of gastroenterology.  Symptoms include occasional diarrhea and vomiting. He also has irritation around his peg site. Tan drainage.  Symptoms have been stable over the past year.  Aggravating factors include having more than 3 tube feeds per day.   Rash  This is a new problem. The current episode started in the past 7 days. The problem is unchanged. The affected locations include the torso (patient has tape around peg site, red, itching, painful in area that tape placed. ). The rash is characterized by blistering and pain. Associated with: adhesive tape. Pertinent negatives include no fatigue or fever. Diarrhea: occasional. Past treatments include nothing.    Immunization History  Administered Date(s) Administered  . Influenza,inj,Quad PF,36+ Mos 01/10/2013  . PPD Test 01/27/2013  . Pneumococcal Polysaccharide-23 06/11/2015  . Tdap 09/12/2012   Past Surgical History:    Procedure Laterality Date  . CORONARY ANGIOPLASTY WITH STENT PLACEMENT    . ESOPHAGOGASTRODUODENOSCOPY N/A 01/18/2013   Procedure: ESOPHAGOGASTRODUODENOSCOPY (EGD);  Surgeon: Rogene Houston, MD;  [facility];  Service: Endoscopy;  Laterality: N/A;  . EUS  03/26/2009   [facility]  . GASTROSTOMY-JEJEUNOSTOMY TUBE CHANGE/PLACEMENT  12/11/2008   MICHAEL SHICK  . I&D EXTREMITY Left 09/12/2012   Procedure: IRRIGATION AND DEBRIDEMENT LEFT FIFTH FINGER;  Surgeon: Roseanne Kaufman, MD;  Location: [place];  Service: Orthopedics;  Laterality: Left;  . JEJUNOSTOMY FEEDING TUBE  07/13/2010  . NERVE AND TENDON REPAIR Left 09/12/2012   Procedure: NERVE AND TENDON REPAIR LEFT FIFTH FINGER;  Surgeon: Roseanne Kaufman, MD;  Location: [place];  Service: Orthopedics;  Laterality: Left;  . OPEN REDUCTION INTERNAL FIXATION (ORIF) METACARPAL Left 09/12/2012   Procedure: OPEN REDUCTION INTERNAL FIXATION LEFT FIFTH FINGER;  Surgeon: Roseanne Kaufman, MD;  Location: [place];  Service: Orthopedics;  Laterality: Left;  . PANCREATIC PSEUDOCYST DRAINAGE  07/13/2010  . PORTACATH PLACEMENT  11/28/2008   FLEISHMAN   Review of Systems  Constitutional: Negative.  Negative for fatigue and fever.  HENT: Negative.   Eyes: Negative.  Negative for photophobia and visual disturbance.  Respiratory: Negative.  Negative for chest tightness.   Cardiovascular: Negative.  Negative for chest pain, palpitations and leg swelling.  Gastrointestinal: Positive for abdominal pain. Diarrhea: occasional.  Endocrine: Negative.  Negative for polydipsia, polyphagia and polyuria.  Genitourinary: Negative.   Musculoskeletal: Negative.   Skin: Positive for rash and wound (Tan drainage around PEG site).  Allergic/Immunologic: Negative.  Negative for immunocompromised state.  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  Hematological: Negative.        Objective:   Physical Exam   Constitutional: He is oriented to person, place, and time. He appears well-developed. He appears cachectic.  HENT:  Head: Normocephalic and atraumatic.  Right Ear: External ear normal.  Left Ear: External ear normal.  Nose: Nose normal.  Mouth/Throat: Oropharynx is clear and moist. Abnormal dentition (partially edentulous).  Eyes: Conjunctivae and EOM are normal. Pupils are equal, round, and reactive to light.  Neck: Trachea normal and normal range of motion. Neck supple.  Cardiovascular: Normal rate, regular rhythm, normal heart sounds and intact distal pulses.   Pulmonary/Chest: Effort normal and breath sounds normal.  Abdominal: Soft. Bowel sounds are normal. There is tenderness in the right upper quadrant and left upper quadrant.  LUG PEG tube, pus, or edema, tender to palpation.  Mild erythema  Musculoskeletal: Normal range of motion.  Neurological: He is oriented to person, place, and time. He has normal reflexes.  Skin: Skin is warm and dry. Rash noted.     Tan drainage, copious and erythema to peg insertion site.   Psychiatric: He has a normal mood and affect. His speech is normal and behavior is normal. Thought content normal.      BP 120/64 (BP Location: Left Arm, Patient Position: Sitting, Cuff Size: Normal)   Pulse 69   Temp 97.9 F (36.6 C)   Resp 18   Ht '5\' 7"'$  (1.702 m)   Wt 104 lb 3.2 oz (47.3 kg)   SpO2 100%   BMI 16.32 kg/m  Assessment & Plan:   1. Irritation around percutaneous endoscopic gastrostomy (PEG) tube site ([facility]) - WOUND CULTURE - CBC with Differential  2. Infection of PEG site ([place]) - cephALEXin (KEFLEX) 500 MG capsule; Take 1 capsule (500 mg total) by mouth 2 (two) times daily.  Dispense: 20 capsule; Refill: 0 - WOUND CULTURE - triamcinolone acetonide (KENALOG-40) injection 40 mg; Inject 1 mL (40 mg total) into the muscle once. - CBC with Differential  3. Acute midline low back pain with sciatica, sciatica laterality unspecified  -  HYDROcodone-acetaminophen (NORCO/VICODIN) 5-325 MG tablet; Take 1 tablet by mouth every 6 (six) hours as needed for moderate pain.  Dispense: 15 tablet; Refill: 0  4. Acute allergic reaction, initial encounter - hydrocortisone cream 0.5 %; Apply 1 application topically 2 (two) times daily.  Dispense: 30 g; Refill: 0 - cetirizine (ZYRTEC) 10 MG tablet; Take 1 tablet (10 mg total) by mouth daily.  Dispense: 30 tablet; Refill: 11  5. Seizure disorder ([facility]) - Phenytoin level, total  6. Tobacco use disorder Smoking cessation instruction/counseling given:  counseled patient on the dangers of tobacco use, advised patient to stop smoking, and reviewed strategies to maximize success   7. Alcohol-induced chronic pancreatitis [facility]) Mr. Phung says that he is not under the care of gastroenterology. He had a PEG tube placed during a previous flare of pancreatitis. Reviewed previous CT scan from 03/13/2013, pseudocysts present. He is unsure of the date that it was placed. He says that he no longer drinks alcohol. He has been sober for 1 year. He continues Creon three times daily prior to meals. Patient has and appointment scheduled with GI.   RTC: Follow up in 10 days for PEG tube infection  Jency Schnieders M, FNP    The patient was given clear instructions to go to ER or  return to medical center if symptoms do not improve, worsen or new problems develop. The patient verbalized understanding. Will notify patient with laboratory results.

## 2016-02-28 NOTE — Telephone Encounter (Signed)
Referring MD/PCP: lachine hollis   Procedure: tcs w propofol  Reason/Indication:  Hx polyps  Has patient had this procedure before?  Yes, 2013  If so, when, by whom and where?    Is there a family history of colon cancer?  Not sure  Who?  What age when diagnosed?    Is patient diabetic?   no      Does patient have prosthetic heart valve or mechanical valve?  no  Do you have a pacemaker?  no  Has patient ever had endocarditis? no  Has patient had joint replacement within last 12 months?  no  Does patient tend to be constipated or take laxatives? no  Does patient have a history of alcohol/drug use?  n  Is patient on Coumadin, Plavix and/or Aspirin? no  Medications: see epic  Allergies: nkda  Medication Adjustment: iron 10 days  Procedure date & time: 03/27/16 at 830

## 2016-02-29 LAB — PHENYTOIN LEVEL, TOTAL: PHENYTOIN LVL: 20.2 ug/mL — AB (ref 10.0–20.0)

## 2016-03-02 ENCOUNTER — Encounter: Payer: Self-pay | Admitting: Family Medicine

## 2016-03-02 NOTE — Telephone Encounter (Signed)
agree

## 2016-03-03 ENCOUNTER — Other Ambulatory Visit: Payer: Self-pay | Admitting: Family Medicine

## 2016-03-03 DIAGNOSIS — K9429 Other complications of gastrostomy: Secondary | ICD-10-CM

## 2016-03-03 LAB — WOUND CULTURE: Gram Stain: NONE SEEN

## 2016-03-03 MED ORDER — SULFAMETHOXAZOLE-TRIMETHOPRIM 800-160 MG PO TABS: 1.0000 | ORAL_TABLET | Freq: Two times a day (BID) | ORAL | 0 refills | Status: AC

## 2016-03-03 MED ORDER — FLUCONAZOLE 200 MG PO TABS: 200.0000 mg | ORAL_TABLET | Freq: Every day | ORAL | 0 refills | Status: AC

## 2016-03-03 NOTE — Progress Notes (Signed)
Reviewed labs with Dr. Doreene Burke. Wound is growing MRSA, will start Bactrim 800-160 mg every 12 hours for 14 days. Will also start Difflucan 200 mg for 14 days. Will follow up in office as scheduled.   Meds ordered this encounter  Medications  . sulfamethoxazole-trimethoprim (BACTRIM DS) 800-160 MG tablet    Sig: Take 1 tablet by mouth 2 (two) times daily.    Dispense:  28 tablet    Refill:  0  . fluconazole (DIFLUCAN) 200 MG tablet    Sig: Take 1 tablet (200 mg total) by mouth daily.    Dispense:  14 tablet    Refill:  0    Ludmilla Mcgillis M, FNP

## 2016-03-09 ENCOUNTER — Encounter: Payer: Self-pay | Admitting: Family Medicine

## 2016-03-09 ENCOUNTER — Ambulatory Visit (INDEPENDENT_AMBULATORY_CARE_PROVIDER_SITE_OTHER): Payer: Medicaid Other | Admitting: Family Medicine

## 2016-03-09 VITALS — BP 135/73 | HR 72 | Temp 97.9°F | Resp 14 | Ht 67.0 in | Wt 100.0 lb

## 2016-03-09 DIAGNOSIS — F17200 Nicotine dependence, unspecified, uncomplicated: Secondary | ICD-10-CM

## 2016-03-09 DIAGNOSIS — K9429 Other complications of gastrostomy: Secondary | ICD-10-CM

## 2016-03-09 DIAGNOSIS — T85848S Pain due to other internal prosthetic devices, implants and grafts, sequela: Secondary | ICD-10-CM

## 2016-03-09 DIAGNOSIS — G40909 Epilepsy, unspecified, not intractable, without status epilepticus: Secondary | ICD-10-CM

## 2016-03-09 MED ORDER — "BIOGUARD GAUZE SPONGES 4""X4"" PADS": 1.0000 | MEDICATED_PAD | Freq: Two times a day (BID) | 5 refills | Status: DC

## 2016-03-09 MED ORDER — OXYCODONE HCL 5 MG PO CAPS: 5.0000 mg | ORAL_CAPSULE | Freq: Four times a day (QID) | ORAL | 0 refills | Status: DC | PRN

## 2016-03-09 MED ORDER — "CLOTH ADHESIVE SURG 1/2""X10YD TAPE": 1.0000 | MEDICATED_TAPE | Freq: Two times a day (BID) | 2 refills | Status: AC | PRN

## 2016-03-09 NOTE — Progress Notes (Signed)
Subjective:    Patient ID: Marcus Brooks, male    DOB: 1960/08/07, 56 y.o.   MRN: [id number] Mr. Marcus Brooks, a 56 year old male wtiha history of seizure disorder, chronic pancreatitis, and poor nutrition presents for a follow up of chronic conditions. He was recently evaluated in the emergency department for displacement of Left lower quadrant peg tube. . Mr. Marcus Brooks has a history of seizure disorder. Seizures are controlled on Dilantin. He has been taking medication consistently. He says that it has been greater than 6 months since last seizure activity.   Mr. 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 He states that he has not had alcohol in greater than 1 year. He had a left upper quadrant PEG tube placed during one of his pancreatitis flares.  He continues to have tenderness and drainage to the PEG insertion site. Drainage was cultured on last week. Patient was found to have a MRSA infection. He was started on Bactrim 800-160 mg for 14 days. He has been taking medication consistently. He is continuing to complain of severe, burning pain to PEG site. He is currently out of pain medication. He says that drainage has improved some, but he continues to have a moderate amount of drainage.    He is not currently under the care of gastroenterology.  Symptoms include occasional diarrhea and vomiting. He also has irritation around his peg site. Tan drainage.  Symptoms have been stable over the past year.  Aggravating factors include having more than 3 tube feeds per day.   HPI  Immunization History  Administered Date(s) Administered  . Influenza,inj,Quad PF,36+ Mos 01/10/2013  . PPD Test 01/27/2013  . Pneumococcal Polysaccharide-23 06/11/2015  . Tdap 09/12/2012   Past Surgical History:  Procedure Laterality Date  . CORONARY ANGIOPLASTY WITH STENT PLACEMENT    . ESOPHAGOGASTRODUODENOSCOPY N/A 01/18/2013   Procedure: ESOPHAGOGASTRODUODENOSCOPY (EGD);  Surgeon: Rogene Houston, MD;  [facility];  Service: Endoscopy;  Laterality: N/A;  . EUS  03/26/2009   [facility]  . GASTROSTOMY-JEJEUNOSTOMY TUBE CHANGE/PLACEMENT  12/11/2008   [facility]  . I&D EXTREMITY Left 09/12/2012   Procedure: IRRIGATION AND DEBRIDEMENT LEFT FIFTH FINGER;  Surgeon: Roseanne Kaufman, MD;  Location: [place];  Service: Orthopedics;  Laterality: Left;  . JEJUNOSTOMY FEEDING TUBE  07/13/2010  . NERVE AND TENDON REPAIR Left 09/12/2012   Procedure: NERVE AND TENDON REPAIR LEFT FIFTH FINGER;  Surgeon: Roseanne Kaufman, MD;  Location: [place];  Service: Orthopedics;  Laterality: Left;  . OPEN REDUCTION INTERNAL FIXATION (ORIF) METACARPAL Left 09/12/2012   Procedure: OPEN REDUCTION INTERNAL FIXATION LEFT FIFTH FINGER;  Surgeon: Roseanne Kaufman, MD;  Location: [place];  Service: Orthopedics;  Laterality: Left;  . PANCREATIC PSEUDOCYST DRAINAGE  07/13/2010  . PORTACATH PLACEMENT  11/28/2008   [facility]   Review of Systems  Constitutional: Negative.   HENT: Negative.   Eyes: Negative.  Negative for photophobia and visual disturbance.  Respiratory: Negative.  Negative for chest tightness.   Cardiovascular: Negative.  Negative for chest pain, palpitations and leg swelling.  Gastrointestinal: Positive for abdominal pain.  Endocrine: Negative.  Negative for polydipsia, polyphagia and polyuria.  Genitourinary: Negative.   Musculoskeletal: Negative.   Skin: Positive for wound (Tan drainage around PEG site).  Allergic/Immunologic: Negative.  Negative for immunocompromised state.  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  Hematological: Negative.        Objective:   Physical  Exam  Constitutional: He is oriented to person, place, and time. He appears well-developed. He appears cachectic.  HENT:  Head: Normocephalic and atraumatic.  Right Ear: External ear normal.  Left Ear: External ear normal.  Nose: Nose normal.  Mouth/Throat:  Oropharynx is clear and moist. Abnormal dentition (partially edentulous).  Eyes: Conjunctivae and EOM are normal. Pupils are equal, round, and reactive to light.  Neck: Trachea normal and normal range of motion. Neck supple.  Cardiovascular: Normal rate, regular rhythm, normal heart sounds and intact distal pulses.   Pulmonary/Chest: Effort normal and breath sounds normal.  Abdominal: Soft. Bowel sounds are normal. There is tenderness in the right upper quadrant and left upper quadrant.  LUG PEG tube, pus, or edema, tender to palpation.  Mild erythema  Musculoskeletal: Normal range of motion.  Neurological: He is oriented to person, place, and time. He has normal reflexes.  Skin: Skin is warm and dry. Rash noted.     Tan drainage, copious and erythema to peg insertion site.   Psychiatric: He has a normal mood and affect. His speech is normal and behavior is normal. Thought content normal.      BP 135/73 (BP Location: Right Arm, Patient Position: Sitting, Cuff Size: Normal)   Pulse 72   Temp 97.9 F (36.6 C) (Oral)   Resp 14   Ht '5\' 7"'$  (1.702 m)   Wt 100 lb (45.4 kg)   SpO2 100%   BMI 15.66 kg/m  Assessment & Plan:  1. Irritation around percutaneous endoscopic gastrostomy (PEG) tube site [facility]) Patient will continue antibiotic as prescribed. I will send a referral to general surgery for further evaluation. Patient continues to have copious drainage.  - Ambulatory referral to General Surgery - Gauze Pads & Dressings (BIOGUARD GAUZE SPONGES) 4"X4" PADS; 1 each by Does not apply route 2 (two) times daily.  Dispense: 1 each; Refill: 5 - Adhesive Tape (CLOTH ADHESIVE SURG 1/2"X10YD) TAPE; 1 each by Does not apply route 2 (two) times daily as needed.  Dispense: 1 each; Refill: 2  2. Pain around percutaneous endoscopic gastrostomy (PEG) tube site, sequela Reviewed [place] Substance Reporting system prior to prescribing opiate medication, no inconsistencies noted.   - Ambulatory referral to  General Surgery - oxycodone (OXY-IR) 5 MG capsule; Take 1 capsule (5 mg total) by mouth every 6 (six) hours as needed.  Dispense: 15 capsule; Refill: 0  3. Seizure disorder (HCC) - Phenytoin level, free; Future  4. Tobacco use disorder Smoking cessation instruction/counseling given:  counseled patient on the dangers of tobacco use, advised patient to stop smoking, and reviewed strategies to maximize success  5.  Alcohol-induced chronic pancreatitis [facility]) Mr. Maher says that he is not under the care of gastroenterology. He had a PEG tube placed during a previous flare of pancreatitis. Reviewed previous CT scan from 03/13/2013, pseudocysts present. He is unsure of the date that it was placed. He says that he no longer drinks alcohol. He has been sober for 1 year. He continues Creon three times daily prior to meals. Patient has and appointment scheduled with GI.   RTC: Follow up in 1 month for dilantin lab  Lyrik Dockstader M, FNP    The patient was given clear instructions to go to ER or return to medical center if symptoms do not improve, worsen or new problems develop. The patient verbalized understanding. Will notify patient with laboratory results.

## 2016-03-09 NOTE — Patient Instructions (Signed)
Will recheck Dilantin levels in 1 month Will send a referral to general surgery for PEG tube due to increased drainage. I will defer to general surgery for further treatment and evaluation.  Will prescribe Oxycodone 5 mg every 6 hours for severe pain.

## 2016-03-11 ENCOUNTER — Other Ambulatory Visit: Payer: Self-pay | Admitting: Family Medicine

## 2016-03-11 DIAGNOSIS — K9429 Other complications of gastrostomy: Secondary | ICD-10-CM

## 2016-03-18 ENCOUNTER — Other Ambulatory Visit: Payer: Self-pay | Admitting: Family Medicine

## 2016-03-19 NOTE — Patient Instructions (Signed)
Marcus Brooks  03/19/2016     '@[email or web address]'$ @   Your procedure is scheduled on  03/27/2016   Report to [facility] at  700  A.M.  Call this number if you have problems the morning of surgery:  [phone]   Remember:  Do not eat food or drink liquids after midnight.  Take these medicines the morning of surgery with A SIP OF WATER  Amlodipine, zyrtec, cymbalta,gabapentin, Oxy IR, protonix, dilantin.   Do not wear jewelry, make-up or nail polish.  Do not wear lotions, powders, or perfumes, or deoderant.  Do not shave 48 hours prior to surgery.  Men may shave face and neck.  Do not bring valuables to the hospital.  [facility] is not responsible for any belongings or valuables.  Contacts, dentures or bridgework may not be worn into surgery.  Leave your suitcase in the car.  After surgery it may be brought to your room.  For patients admitted to the hospital, discharge time will be determined by your treatment team.  Patients discharged the day of surgery will not be allowed to drive home.   Name and phone number of your driver:   family Special instructions:  Follow the prep instructions given to you by Dr Olevia Perches office.  Please read over the following fact sheets that you were given. Anesthesia Post-op Instructions and Care and Recovery After Surgery       Colonoscopy, Adult A colonoscopy is an exam to look at the entire large intestine. During the exam, a lubricated, bendable tube is inserted into the anus and then passed into the rectum, colon, and other parts of the large intestine. A colonoscopy is often done as a part of normal colorectal screening or in response to certain symptoms, such as anemia, persistent diarrhea, abdominal pain, and blood in the stool. The exam can help screen for and diagnose medical problems, including:  Tumors.  Polyps.  Inflammation.  Areas of bleeding. Tell a health care provider about:  Any allergies you  have.  All medicines you are taking, including vitamins, herbs, eye drops, creams, and over-the-counter medicines.  Any problems you or family members have had with anesthetic medicines.  Any blood disorders you have.  Any surgeries you have had.  Any medical conditions you have.  Any problems you have had passing stool. What are the risks? Generally, this is a safe procedure. However, problems may occur, including:  Bleeding.  A tear in the intestine.  A reaction to medicines given during the exam.  Infection (rare). What happens before the procedure? Eating and drinking restrictions  Follow instructions from your health care provider about eating and drinking, which may include:  A few days before the procedure - follow a low-fiber diet. Avoid nuts, seeds, dried fruit, raw fruits, and vegetables.  1-3 days before the procedure - follow a clear liquid diet. Drink only clear liquids, such as clear broth or bouillon, black coffee or tea, clear juice, clear soft drinks or sports drinks, gelatin dessert, and popsicles. Avoid any liquids that contain red or purple dye.  On the day of the procedure - do not eat or drink anything during the 2 hours before the procedure, or within the time period that your health care provider recommends. Bowel prep  If you were prescribed an oral bowel prep to clean out your colon:  Take it as told by your health care provider. Starting the day before your  procedure, you will need to drink a large amount of medicated liquid. The liquid will cause you to have multiple loose stools until your stool is almost clear or light green.  If your skin or anus gets irritated from diarrhea, you may use these to relieve the irritation:  Medicated wipes, such as adult wet wipes with aloe and vitamin E.  A skin soothing-product like petroleum jelly.  If you vomit while drinking the bowel prep, take a break for up to 60 minutes and then begin the bowel prep  again. If vomiting continues and you cannot take the bowel prep without vomiting, call your health care provider. General instructions   Ask your health care provider about changing or stopping your regular medicines. This is especially important if you are taking diabetes medicines or blood thinners.  Plan to have someone take you home from the hospital or clinic. What happens during the procedure?  An IV tube may be inserted into one of your veins.  You will be given medicine to help you relax (sedative).  To reduce your risk of infection:  Your health care team will wash or sanitize their hands.  Your anal area will be washed with soap.  You will be asked to lie on your side with your knees bent.  Your health care provider will lubricate a long, thin, flexible tube. The tube will have a camera and a light on the end.  The tube will be inserted into your anus.  The tube will be gently eased through your rectum and colon.  Air will be delivered into your colon to keep it open. You may feel some pressure or cramping.  The camera will be used to take images during the procedure.  A small tissue sample may be removed from your body to be examined under a microscope (biopsy). If any potential problems are found, the tissue will be sent to a lab for testing.  If small polyps are found, your health care provider may remove them and have them checked for cancer cells.  The tube that was inserted into your anus will be slowly removed. The procedure may vary among health care providers and hospitals. What happens after the procedure?  Your blood pressure, heart rate, breathing rate, and blood oxygen level will be monitored until the medicines you were given have worn off.  Do not drive for 24 hours after the exam.  You may have a small amount of blood in your stool.  You may pass gas and have mild abdominal cramping or bloating due to the air that was used to inflate your colon  during the exam.  It is up to you to get the results of your procedure. Ask your health care provider, or the department performing the procedure, when your results will be ready. This information is not intended to replace advice given to you by your health care provider. Make sure you discuss any questions you have with your health care provider. Document Released: 12/27/1999 Document Revised: 10/30/2015 Document Reviewed: 03/12/2015 Elsevier Interactive Patient Education  2017 [employer]  Colonoscopy, Adult, Care After This sheet gives you information about how to care for yourself after your procedure. Your health care provider may also give you more specific instructions. If you have problems or questions, contact your health care provider. What can I expect after the procedure? After the procedure, it is common to have:  A small amount of blood in your stool for 24 hours after the procedure.  Some gas.  Mild abdominal cramping or bloating. Follow these instructions at home: General instructions    For the first 24 hours after the procedure:  Do not drive or use machinery.  Do not sign important documents.  Do not drink alcohol.  Do your regular daily activities at a slower pace than normal.  Eat soft, easy-to-digest foods.  Rest often.  Take over-the-counter or prescription medicines only as told by your health care provider.  It is up to you to get the results of your procedure. Ask your health care provider, or the department performing the procedure, when your results will be ready. Relieving cramping and bloating   Try walking around when you have cramps or feel bloated.  Apply heat to your abdomen as told by your health care provider. Use a heat source that your health care provider recommends, such as a moist heat pack or a heating pad.  Place a towel between your skin and the heat source.  Leave the heat on for 20-30 minutes.  Remove the heat if your  skin turns bright red. This is especially important if you are unable to feel pain, heat, or cold. You may have a greater risk of getting burned. Eating and drinking   Drink enough fluid to keep your urine clear or pale yellow.  Resume your normal diet as instructed by your health care provider. Avoid heavy or fried foods that are hard to digest.  Avoid drinking alcohol for as long as instructed by your health care provider. Contact a health care provider if:  You have blood in your stool 2-3 days after the procedure. Get help right away if:  You have more than a small spotting of blood in your stool.  You pass large blood clots in your stool.  Your abdomen is swollen.  You have nausea or vomiting.  You have a fever.  You have increasing abdominal pain that is not relieved with medicine. This information is not intended to replace advice given to you by your health care provider. Make sure you discuss any questions you have with your health care provider. Document Released: 08/13/2003 Document Revised: 09/23/2015 Document Reviewed: 03/12/2015 Elsevier Interactive Patient Education  2017 [place] Anesthesia, Adult General anesthesia is the use of medicines to make a person "go to sleep" (be unconscious) for a medical procedure. General anesthesia is often recommended when a procedure:  Is long.  Requires you to be still or in an unusual position.  Is major and can cause you to lose blood.  Is impossible to do without general anesthesia. The medicines used for general anesthesia are called general anesthetics. In addition to making you sleep, the medicines:  Prevent pain.  Control your blood pressure.  Relax your muscles. Tell a health care provider about:  Any allergies you have.  All medicines you are taking, including vitamins, herbs, eye drops, creams, and over-the-counter medicines.  Any problems you or family members have had with anesthetic  medicines.  Types of anesthetics you have had in the past.  Any bleeding disorders you have.  Any surgeries you have had.  Any medical conditions you have.  Any history of heart or lung conditions, such as heart failure, sleep apnea, or chronic obstructive pulmonary disease (COPD).  Whether you are pregnant or may be pregnant.  Whether you use tobacco, alcohol, marijuana, or street drugs.  Any history of Armed forces logistics/support/administrative officer.  Any history of depression or anxiety. What are the risks? Generally, this  is a safe procedure. However, problems may occur, including:  Allergic reaction to anesthetics.  Lung and heart problems.  Inhaling food or liquids from your stomach into your lungs (aspiration).  Injury to nerves.  Waking up during your procedure and being unable to move (rare).  Extreme agitation or a state of mental confusion (delirium) when you wake up from the anesthetic.  Air in the bloodstream, which can lead to stroke. These problems are more likely to develop if you are having a major surgery or if you have an advanced medical condition. You can prevent some of these complications by answering all of your health care provider's questions thoroughly and by following all pre-procedure instructions. General anesthesia can cause side effects, including:  Nausea or vomiting  A sore throat from the breathing tube.  Feeling cold or shivery.  Feeling tired, washed out, or achy.  Sleepiness or drowsiness.  Confusion or agitation. What happens before the procedure? Staying hydrated  Follow instructions from your health care provider about hydration, which may include:  Up to 2 hours before the procedure - you may continue to drink clear liquids, such as water, clear fruit juice, black coffee, and plain tea. Eating and drinking restrictions  Follow instructions from your health care provider about eating and drinking, which may include:  8 hours before the procedure -  stop eating heavy meals or foods such as meat, fried foods, or fatty foods.  6 hours before the procedure - stop eating light meals or foods, such as toast or cereal.  6 hours before the procedure - stop drinking milk or drinks that contain milk.  2 hours before the procedure - stop drinking clear liquids. Medicines   Ask your health care provider about:  Changing or stopping your regular medicines. This is especially important if you are taking diabetes medicines or blood thinners.  Taking medicines such as aspirin and ibuprofen. These medicines can thin your blood. Do not take these medicines before your procedure if your health care provider instructs you not to.  Taking new dietary supplements or medicines. Do not take these during the week before your procedure unless your health care provider approves them.  If you are told to take a medicine or to continue taking a medicine on the day of the procedure, take the medicine with sips of water. General instructions    Ask if you will be going home the same day, the following day, or after a longer hospital stay.  Plan to have someone take you home.  Plan to have someone stay with you for the first 24 hours after you leave the hospital or clinic.  For 3-6 weeks before the procedure, try not to use any tobacco products, such as cigarettes, chewing tobacco, and e-cigarettes.  You may brush your teeth on the morning of the procedure, but make sure to spit out the toothpaste. What happens during the procedure?  You will be given anesthetics through a mask and through an IV tube in one of your veins.  You may receive medicine to help you relax (sedative).  As soon as you are asleep, a breathing tube may be used to help you breathe.  An anesthesia specialist will stay with you throughout the procedure. He or she will help keep you comfortable and safe by continuing to give you medicines and adjusting the amount of medicine that you  get. He or she will also watch your blood pressure, pulse, and oxygen levels to make sure that  the anesthetics do not cause any problems.  If a breathing tube was used to help you breathe, it will be removed before you wake up. The procedure may vary among health care providers and hospitals. What happens after the procedure?  You will wake up, often slowly, after the procedure is complete, usually in a recovery area.  Your blood pressure, heart rate, breathing rate, and blood oxygen level will be monitored until the medicines you were given have worn off.  You may be given medicine to help you calm down if you feel anxious or agitated.  If you will be going home the same day, your health care provider may check to make sure you can stand, drink, and urinate.  Your health care providers will treat your pain and side effects before you go home.  Do not drive for 24 hours if you received a sedative.  You may:  Feel nauseous and vomit.  Have a sore throat.  Have mental slowness.  Feel cold or shivery.  Feel sleepy.  Feel tired.  Feel sore or achy, even in parts of your body where you did not have surgery. This information is not intended to replace advice given to you by your health care provider. Make sure you discuss any questions you have with your health care provider. Document Released: 04/07/2007 Document Revised: 06/11/2015 Document Reviewed: 12/13/2014 Elsevier Interactive Patient Education  2017 [place], Care After These instructions provide you with information about caring for yourself after your procedure. Your health care provider may also give you more specific instructions. Your treatment has been planned according to current medical practices, but problems sometimes occur. Call your health care provider if you have any problems or questions after your procedure. What can I expect after the procedure? After your procedure, it is  common to:  Feel sleepy for several hours.  Feel clumsy and have poor balance for several hours.  Feel forgetful about what happened after the procedure.  Have poor judgment for several hours.  Feel nauseous or vomit.  Have a sore throat if you had a breathing tube during the procedure. Follow these instructions at home: For at least 24 hours after the procedure:    Do not:  Participate in activities in which you could fall or become injured.  Drive.  Use heavy machinery.  Drink alcohol.  Take sleeping pills or medicines that cause drowsiness.  Make important decisions or sign legal documents.  Take care of children on your own.  Rest. Eating and drinking   Follow the diet that is recommended by your health care provider.  If you vomit, drink water, juice, or soup when you can drink without vomiting.  Make sure you have little or no nausea before eating solid foods. General instructions   Have a responsible adult stay with you until you are awake and alert.  Take over-the-counter and prescription medicines only as told by your health care provider.  If you smoke, do not smoke without supervision.  Keep all follow-up visits as told by your health care provider. This is important. Contact a health care provider if:  You keep feeling nauseous or you keep vomiting.  You feel light-headed.  You develop a rash.  You have a fever. Get help right away if:  You have trouble breathing. This information is not intended to replace advice given to you by your health care provider. Make sure you discuss any questions you have with your health care provider.  Document Released: 04/21/2015 Document Revised: 08/21/2015 Document Reviewed: 04/21/2015 Elsevier Interactive Patient Education  2017 [employer].

## 2016-03-24 ENCOUNTER — Other Ambulatory Visit (HOSPITAL_COMMUNITY): Payer: Medicaid Other

## 2016-03-24 ENCOUNTER — Encounter (HOSPITAL_COMMUNITY)
Admission: RE | Admit: 2016-03-24 | Discharge: 2016-03-24 | Disposition: A | Discharge status: Home / Self Care | Payer: Medicaid Other | Source: Ambulatory Visit | Attending: Internal Medicine | Admitting: Internal Medicine

## 2016-03-26 ENCOUNTER — Encounter (INDEPENDENT_AMBULATORY_CARE_PROVIDER_SITE_OTHER): Payer: Self-pay | Admitting: *Deleted

## 2016-03-26 ENCOUNTER — Encounter (HOSPITAL_COMMUNITY): Admission: RE | Admit: 2016-03-26 | Payer: Medicaid Other | Source: Ambulatory Visit

## 2016-03-26 ENCOUNTER — Encounter (HOSPITAL_COMMUNITY): Payer: Self-pay

## 2016-04-06 ENCOUNTER — Ambulatory Visit (HOSPITAL_COMMUNITY)
Admission: RE | Admit: 2016-04-06 | Discharge: 2016-04-06 | Disposition: A | Discharge status: Home / Self Care | Payer: Medicaid Other | Source: Ambulatory Visit | Attending: Family Medicine | Admitting: Family Medicine

## 2016-04-06 ENCOUNTER — Ambulatory Visit (INDEPENDENT_AMBULATORY_CARE_PROVIDER_SITE_OTHER): Payer: Medicaid Other | Admitting: Family Medicine

## 2016-04-06 VITALS — BP 122/70 | HR 73 | Temp 98.2°F | Resp 14 | Ht 67.0 in | Wt 96.4 lb

## 2016-04-06 DIAGNOSIS — R079 Chest pain, unspecified: Secondary | ICD-10-CM

## 2016-04-06 DIAGNOSIS — R64 Cachexia: Secondary | ICD-10-CM

## 2016-04-06 DIAGNOSIS — J90 Pleural effusion, not elsewhere classified: Secondary | ICD-10-CM | POA: Diagnosis not present

## 2016-04-06 DIAGNOSIS — G629 Polyneuropathy, unspecified: Secondary | ICD-10-CM

## 2016-04-06 DIAGNOSIS — R8290 Unspecified abnormal findings in urine: Secondary | ICD-10-CM

## 2016-04-06 DIAGNOSIS — I700 Atherosclerosis of aorta: Secondary | ICD-10-CM | POA: Insufficient documentation

## 2016-04-06 DIAGNOSIS — F329 Major depressive disorder, single episode, unspecified: Secondary | ICD-10-CM | POA: Diagnosis not present

## 2016-04-06 DIAGNOSIS — K9429 Other complications of gastrostomy: Secondary | ICD-10-CM | POA: Diagnosis not present

## 2016-04-06 DIAGNOSIS — G40909 Epilepsy, unspecified, not intractable, without status epilepticus: Secondary | ICD-10-CM

## 2016-04-06 DIAGNOSIS — Z87891 Personal history of nicotine dependence: Secondary | ICD-10-CM | POA: Diagnosis not present

## 2016-04-06 DIAGNOSIS — F32A Depression, unspecified: Secondary | ICD-10-CM

## 2016-04-06 DIAGNOSIS — E46 Unspecified protein-calorie malnutrition: Secondary | ICD-10-CM

## 2016-04-06 DIAGNOSIS — Z720 Tobacco use: Secondary | ICD-10-CM

## 2016-04-06 LAB — CBC WITH DIFFERENTIAL/PLATELET
BASOS PCT: 1 %
Basophils Absolute: 107 cells/uL (ref 0–200)
EOS PCT: 2 %
Eosinophils Absolute: 214 cells/uL (ref 15–500)
HCT: 35.8 % — ABNORMAL LOW (ref 38.5–50.0)
Hemoglobin: 12.5 g/dL — ABNORMAL LOW (ref 13.2–17.1)
LYMPHS PCT: 23 %
Lymphs Abs: 2461 cells/uL (ref 850–3900)
MCH: 32.2 pg (ref 27.0–33.0)
MCHC: 34.9 g/dL (ref 32.0–36.0)
MCV: 92.3 fL (ref 80.0–100.0)
MONOS PCT: 5 %
MPV: 8.4 fL (ref 7.5–12.5)
Monocytes Absolute: 535 cells/uL (ref 200–950)
NEUTROS ABS: 7383 {cells}/uL (ref 1500–7800)
Neutrophils Relative %: 69 %
PLATELETS: 224 10*3/uL (ref 140–400)
RBC: 3.88 MIL/uL — ABNORMAL LOW (ref 4.20–5.80)
RDW: 14.9 % (ref 11.0–15.0)
WBC: 10.7 10*3/uL (ref 3.8–10.8)

## 2016-04-06 LAB — POCT URINALYSIS DIP (DEVICE)
Bilirubin Urine: NEGATIVE
GLUCOSE, UA: NEGATIVE mg/dL
Hgb urine dipstick: NEGATIVE
Nitrite: NEGATIVE
PROTEIN: 30 mg/dL — AB
SPECIFIC GRAVITY, URINE: 1.02 (ref 1.005–1.030)
UROBILINOGEN UA: 2 mg/dL — AB (ref 0.0–1.0)
pH: 6 (ref 5.0–8.0)

## 2016-04-06 LAB — COMPLETE METABOLIC PANEL WITH GFR
ALT: 10 U/L (ref 9–46)
AST: 14 U/L (ref 10–35)
Albumin: 3.9 g/dL (ref 3.6–5.1)
Alkaline Phosphatase: 110 U/L (ref 40–115)
BUN: 8 mg/dL (ref 7–25)
CHLORIDE: 105 mmol/L (ref 98–110)
CO2: 27 mmol/L (ref 20–31)
Calcium: 9.3 mg/dL (ref 8.6–10.3)
Creat: 0.8 mg/dL (ref 0.70–1.33)
GFR, Est African American: >89 mL/min (ref >=60)
GFR, Est Non African American: >89 mL/min (ref >=60)
Glucose, Bld: 116 mg/dL — ABNORMAL HIGH (ref 65–99)
POTASSIUM: 4.1 mmol/L (ref 3.5–5.3)
SODIUM: 139 mmol/L (ref 135–146)
Total Bilirubin: 0.4 mg/dL (ref 0.2–1.2)
Total Protein: 7.1 g/dL (ref 6.1–8.1)

## 2016-04-06 MED ORDER — DULOXETINE HCL 30 MG PO CPEP: 30.0000 mg | ORAL_CAPSULE | Freq: Two times a day (BID) | ORAL | 2 refills | Status: DC

## 2016-04-06 NOTE — Patient Instructions (Addendum)
Will notify by phone with chest xray results Will follow up on laboratory results as well.  Continue medications as previously prescribed. Increased Duloxetine to 30 mg twice daily for worsening depression.  Patient to follow up with gastroenterology for colonoscopy as previously scheduled.    Health Risks of Smoking Smoking cigarettes is very bad for your health. Tobacco smoke has over 200 known poisons in it. It contains the poisonous gases nitrogen oxide and carbon monoxide. There are over 60 chemicals in tobacco smoke that cause cancer. Smoking is difficult to quit because a chemical in tobacco, called nicotine, causes addiction or dependence. When you smoke and inhale, nicotine is absorbed rapidly into the bloodstream through your lungs. Both inhaled and non-inhaled nicotine may be addictive. What are the risks of cigarette smoke? Cigarette smokers have an increased risk of many serious medical problems, including:  Lung cancer.  Lung disease, such as pneumonia, bronchitis, and emphysema.  Chest pain (angina) and heart attack because the heart is not getting enough oxygen.  Heart disease and peripheral blood vessel disease.  High blood pressure (hypertension).  Stroke.  Oral cancer, including cancer of the lip, mouth, or voice box.  Bladder cancer.  Pancreatic cancer.  Cervical cancer.  Pregnancy complications, including premature birth.  Stillbirths and smaller newborn babies, birth defects, and genetic damage to sperm.  Early menopause.  Lower estrogen level for women.  Infertility.  Facial wrinkles.  Blindness.  Increased risk of broken bones (fractures).  Senile dementia.  Stomach ulcers and internal bleeding.  Delayed wound healing and increased risk of complications during surgery.  Even smoking lightly shortens your life expectancy by several years. Because of secondhand smoke exposure, children of smokers have an increased risk of the  following:  Sudden infant death syndrome (SIDS).  Respiratory infections.  Lung cancer.  Heart disease.  Ear infections. What are the benefits of quitting? There are many health benefits of quitting smoking. Here are some of them:  Within days of quitting smoking, your risk of having a heart attack decreases, your blood flow improves, and your lung capacity improves. Blood pressure, pulse rate, and breathing patterns start returning to normal soon after quitting.  Within months, your lungs may clear up completely.  Quitting for 10 years reduces your risk of developing lung cancer and heart disease to almost that of a nonsmoker.  People who quit may see an improvement in their overall quality of life. How do I quit smoking? Smoking is an addiction with both physical and psychological effects, and longtime habits can be hard to change. Your health care provider can recommend:  Programs and community resources, which may include group support, education, or talk therapy.  Prescription medicines to help reduce cravings.  Nicotine replacement products, such as patches, gum, and nasal sprays. Use these products only as directed. Do not replace cigarette smoking with electronic cigarettes, which are commonly called e-cigarettes. The safety of e-cigarettes is not known, and some may contain harmful chemicals.  A combination of two or more of these methods. Where to find more information:  American Lung Association: www.lung.org  American Cancer Society: www.cancer.org Summary  Smoking cigarettes is very bad for your health. Cigarette smokers have an increased risk of many serious medical problems, including several cancers, heart disease, and stroke.  Smoking is an addiction with both physical and psychological effects, and longtime habits can be hard to change.  By stopping right away, you can greatly reduce the risk of medical problems for you and your  family.  To help you quit  smoking, your health care provider can recommend programs, community resources, prescription medicines, and nicotine replacement products such as patches, gum, and nasal sprays. This information is not intended to replace advice given to you by your health care provider. Make sure you discuss any questions you have with your health care provider. Document Released: 02/06/2004 Document Revised: 01/03/2016 Document Reviewed: 01/03/2016 Elsevier Interactive Patient Education  2017 [employer].

## 2016-04-07 ENCOUNTER — Encounter: Payer: Self-pay | Admitting: Family Medicine

## 2016-04-07 LAB — MAGNESIUM: MAGNESIUM: 1.8 mg/dL (ref 1.5–2.5)

## 2016-04-07 LAB — URINE CULTURE: Organism ID, Bacteria: NO GROWTH

## 2016-04-07 LAB — PHENYTOIN LEVEL, TOTAL: PHENYTOIN LVL: 14.3 ug/mL (ref 10.0–20.0)

## 2016-04-07 NOTE — Progress Notes (Signed)
Subjective:    Patient ID: Marcus Brooks, male    DOB: October 15, 1960, 56 y.o.   MRN: [id number]  HPI Marcus Brooks, a 56 year old male wtiha history of seizure disorder, chronic pancreatitis, and poor nutrition presents for a follow up of chronic conditions. Marcus Brooks has a history of seizure disorder. Seizures are controlled on Dilantin. He has been taking medication consistently. He says that it has been greater than 6 months since last seizure activity.    He is complaining of worsening neuropathy and weight loss.  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 He has been taking Gabapentin 300 mg TID consistently without sustained relief.   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 He states that he has not had alcohol in greater than 1 year. He had a left upper quadrant PEG tube placed during one of his pancreatitis flares.  He continues to have tenderness to the PEG insertion site. He is under the care of gastroenterology in [facility] with Dr. Hildred Laser. He has a colonoscopy scheduled for 04/17/2016.   Symptoms include occasional vomiting.  He also has irritation around his peg site. Aggravating factors include eating large meals. Prior diagnostic studies include abdominal CT on 03/13/2013. According to previous records, PEG tube was replaced on 02/19/2016.  Patient complains of depression. He complains of anhedonia, depressed mood, fatigue and insomnia. He has been taking Cymbalta consistently. He is currently not under the care of psychiatry.  He denies current suicidal and homicidal plan or intent.       Cardiovascular risk factors include  sedentary lifestyle and smoking/ tobacco exposure. Patient has been smoking 1 pack of cigarettes per day over the past 40 years.  Immunization History  Administered Date(s) Administered  . Influenza,inj,Quad PF,36+ Mos 01/10/2013  . PPD Test 01/27/2013  . Pneumococcal Polysaccharide-23 06/11/2015  . Tdap  09/12/2012   Past Surgical History:  Procedure Laterality Date  . CORONARY ANGIOPLASTY WITH STENT PLACEMENT    . ESOPHAGOGASTRODUODENOSCOPY N/A 01/18/2013   Procedure: ESOPHAGOGASTRODUODENOSCOPY (EGD);  Surgeon: Rogene Houston, MD;  [facility];  Service: Endoscopy;  Laterality: N/A;  . EUS  03/26/2009   [facility]  . GASTROSTOMY-JEJEUNOSTOMY TUBE CHANGE/PLACEMENT  12/11/2008   [facility] SH[facility]  . I&D EXTREMITY Left 09/12/2012   Procedure: IRRIGATION AND DEBRIDEMENT LEFT FIFTH FINGER;  Surgeon: Roseanne Kaufman, MD;  Location: [place];  Service: Orthopedics;  Laterality: Left;  . JEJUNOSTOMY FEEDING TUBE  07/13/2010  . NERVE AND TENDON REPAIR Left 09/12/2012   Procedure: NERVE AND TENDON REPAIR LEFT FIFTH FINGER;  Surgeon: Roseanne Kaufman, MD;  Location: [place];  Service: Orthopedics;  Laterality: Left;  . OPEN REDUCTION INTERNAL FIXATION (ORIF) METACARPAL Left 09/12/2012   Procedure: OPEN REDUCTION INTERNAL FIXATION LEFT FIFTH FINGER;  Surgeon: Roseanne Kaufman, MD;  Location: [place];  Service: Orthopedics;  Laterality: Left;  . PANCREATIC PSEUDOCYST DRAINAGE  07/13/2010  . PORTACATH PLACEMENT  11/28/2008   FLEISHMAN   Depression screen [facility] 2/9 04/06/2016 04/06/2016 03/09/2016 02/28/2016 01/28/2016  Decreased Interest 1 0 0 0 1  Down, Depressed, Hopeless 1 0 0 0 0  PHQ - 2 Score 2 0 0 0 1  Altered sleeping - 1 - - -  Tired, decreased energy - 2 - - -  Change in appetite - 2 - - -  Feeling bad or failure about yourself  - 2 - - -  Trouble concentrating - 1 - - -  Moving slowly  or fidgety/restless - 0 - - -  Suicidal thoughts - 1 - - -  PHQ-9 Score - 9 - - -   Review of Systems  Constitutional: Negative.  Negative for fatigue and fever.  HENT: Negative.   Eyes: Negative.  Negative for photophobia.  Respiratory: Negative.  Negative for chest tightness.   Cardiovascular: Negative.  Negative for palpitations and leg swelling.  Gastrointestinal: Positive for abdominal pain.  Endocrine:  Negative.  Negative for polydipsia, polyphagia and polyuria.  Genitourinary: Negative.   Musculoskeletal: Negative.   Skin: Negative.   Allergic/Immunologic: Negative.  Negative for immunocompromised state.  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  Hematological: Negative.   Psychiatric/Behavioral: Positive for depression and sleep disturbance.       Objective:   Physical Exam  Constitutional: He is oriented to person, place, and time. He appears well-developed. He appears cachectic. He appears toxic.  HENT:  Head: Normocephalic and atraumatic.  Right Ear: External ear normal.  Left Ear: External ear normal.  Nose: Nose normal.  Mouth/Throat: Oropharynx is clear and moist. Abnormal dentition (partially edentulous).  Eyes: Conjunctivae and EOM are normal. Pupils are equal, round, and reactive to light.  Neck: Trachea normal and normal range of motion. Neck supple.  Cardiovascular: Normal rate, regular rhythm, normal heart sounds and intact distal pulses.   Pulmonary/Chest: Effort normal and breath sounds normal.  Abdominal: Soft. Bowel sounds are normal. There is tenderness in the right upper quadrant and left upper quadrant.  LUG PEG tube, pus, or edema, tender to palpation.  Mild erythema  Musculoskeletal: Normal range of motion.  Neurological: He is oriented to person, place, and time. He has normal reflexes.  Skin: Skin is warm, dry and intact. No rash noted. No cyanosis. There is pallor. Nails show no clubbing.  Psychiatric: His speech is normal and behavior is normal. Thought content normal. He exhibits a depressed mood.      BP 122/70 (BP Location: Left Arm, Patient Position: Sitting, Cuff Size: Small)   Pulse 73   Temp 98.2 F (36.8 C) (Oral)   Resp 14   Ht '5\' 7"'$  (1.702 m)   Wt 96 lb 6.4 oz (43.7 kg)   SpO2 99%   BMI 15.10 kg/m  Assessment & Plan:  1. Seizure disorder (HCC) - Phenytoin level, total   2.  Malnutrition, unspecified type [facility])  Patient has a colonoscopy scheduled for 04/17/2016, he was encouraged to follow up with gastroenterology.  - COMPLETE METABOLIC PANEL WITH GFR - CBC with Differential - Magnesium 3. Cachexia (HCC) - COMPLETE METABOLIC PANEL WITH GFR - CBC with Differential - Magnesium 4. Depression, unspecified depression type - DULoxetine (CYMBALTA) 30 MG capsule; Take 1 capsule (30 mg total) by mouth 2 (two) times daily.  Dispense: 60 capsule; Refill: 2  5. Tobacco use Smoking cessation instruction/counseling given:  counseled patient on the dangers of tobacco use, advised patient to stop smoking, and reviewed strategies to maximize success 6. Chest pain, unspecified type - DG Chest 2 View; Future - EKG 12-Lead  7. Neuropathy (HCC) - DULoxetine (CYMBALTA) 30 MG capsule; Take 1 capsule (30 mg total) by mouth 2 (two) times daily.  Dispense: 60 capsule; Refill: 2   8. Irritation around percutaneous endoscopic gastrostomy (PEG) tube site [facility])  Patient continues to use a feeding syringe 3-4 times daily to give himself formula. Recommend routine skin care 1-2 times daily to prevent skin breakdown. No pus, erythema, or edema to PEG site.  RTC: Will follow by phone with laboratory results, in 1 month for chronic conditions   [place]  MSN, FNP-C [facility] [place], [place] [address] [phone]   The patient was given clear instructions to go to ER or return to medical center if symptoms do not improve, worsen or new problems develop. The patient verbalized understanding. Will notify patient with laboratory results.

## 2016-04-09 ENCOUNTER — Other Ambulatory Visit: Payer: Self-pay | Admitting: Family Medicine

## 2016-04-09 DIAGNOSIS — I700 Atherosclerosis of aorta: Secondary | ICD-10-CM | POA: Insufficient documentation

## 2016-04-09 DIAGNOSIS — R9389 Abnormal findings on diagnostic imaging of other specified body structures: Secondary | ICD-10-CM

## 2016-04-13 ENCOUNTER — Encounter (HOSPITAL_COMMUNITY): Payer: Self-pay

## 2016-04-13 ENCOUNTER — Encounter (HOSPITAL_COMMUNITY)
Admission: RE | Admit: 2016-04-13 | Discharge: 2016-04-13 | Disposition: A | Discharge status: Home / Self Care | Payer: Medicaid Other | Source: Ambulatory Visit | Attending: Internal Medicine | Admitting: Internal Medicine

## 2016-04-15 ENCOUNTER — Other Ambulatory Visit: Payer: Self-pay | Admitting: Family Medicine

## 2016-04-15 DIAGNOSIS — G40909 Epilepsy, unspecified, not intractable, without status epilepticus: Secondary | ICD-10-CM

## 2016-04-15 DIAGNOSIS — I10 Essential (primary) hypertension: Secondary | ICD-10-CM

## 2016-04-17 ENCOUNTER — Ambulatory Visit (HOSPITAL_COMMUNITY): Admission: RE | Admit: 2016-04-17 | Payer: Medicaid Other | Source: Ambulatory Visit | Admitting: Internal Medicine

## 2016-04-17 ENCOUNTER — Encounter (HOSPITAL_COMMUNITY): Admission: RE | Payer: Self-pay | Source: Ambulatory Visit

## 2016-04-17 SURGERY — COLONOSCOPY WITH PROPOFOL
Anesthesia: Monitor Anesthesia Care

## 2016-04-23 ENCOUNTER — Encounter (INDEPENDENT_AMBULATORY_CARE_PROVIDER_SITE_OTHER): Payer: Self-pay | Admitting: *Deleted

## 2016-04-23 ENCOUNTER — Telehealth (INDEPENDENT_AMBULATORY_CARE_PROVIDER_SITE_OTHER): Payer: Self-pay | Admitting: *Deleted

## 2016-04-23 ENCOUNTER — Telehealth (INDEPENDENT_AMBULATORY_CARE_PROVIDER_SITE_OTHER): Payer: Self-pay | Admitting: Internal Medicine

## 2016-04-23 ENCOUNTER — Other Ambulatory Visit (INDEPENDENT_AMBULATORY_CARE_PROVIDER_SITE_OTHER): Payer: Self-pay | Admitting: Internal Medicine

## 2016-04-23 DIAGNOSIS — Z86010 Personal history of colonic polyps: Secondary | ICD-10-CM

## 2016-04-23 NOTE — Telephone Encounter (Signed)
Patient needs trilyte 

## 2016-04-23 NOTE — Telephone Encounter (Signed)
Referring MD/PCP: lachine hollis   Procedure: tcs w propofol  Reason/Indication:  Hx polyps  Has patient had this procedure before?  Yes, 2013  If so, when, by whom and where?    Is there a family history of colon cancer?  Not sure  Who?  What age when diagnosed?    Is patient diabetic?   no      Does patient have prosthetic heart valve or mechanical valve?  no  Do you have a pacemaker?  no  Has patient ever had endocarditis? no  Has patient had joint replacement within last 12 months?  no  Does patient tend to be constipated or take laxatives? no  Does patient have a history of alcohol/drug use?    Is patient on Coumadin, Plavix and/or Aspirin? no  Medications: see epic  Allergies: see epic  Medication Adjustment per Dr [name]: iron 10 days  Procedure date & time: 05/22/16 at 1030 preop 5/7 @ 1245

## 2016-04-23 NOTE — Telephone Encounter (Signed)
error 

## 2016-04-27 MED ORDER — PEG 3350-KCL-NA BICARB-NACL 420 G PO SOLR: 4000.0000 mL | Freq: Once | ORAL | 0 refills | Status: AC

## 2016-04-27 NOTE — Telephone Encounter (Signed)
agree

## 2016-04-28 ENCOUNTER — Telehealth: Payer: Self-pay | Admitting: Family Medicine

## 2016-04-28 ENCOUNTER — Other Ambulatory Visit: Payer: Self-pay | Admitting: Family Medicine

## 2016-04-28 ENCOUNTER — Ambulatory Visit (HOSPITAL_COMMUNITY)
Admission: RE | Admit: 2016-04-28 | Discharge: 2016-04-28 | Disposition: A | Discharge status: Home / Self Care | Payer: Medicaid Other | Source: Ambulatory Visit | Attending: Family Medicine | Admitting: Family Medicine

## 2016-04-28 DIAGNOSIS — R911 Solitary pulmonary nodule: Secondary | ICD-10-CM

## 2016-04-28 DIAGNOSIS — R590 Localized enlarged lymph nodes: Secondary | ICD-10-CM | POA: Insufficient documentation

## 2016-04-28 DIAGNOSIS — R938 Abnormal findings on diagnostic imaging of other specified body structures: Secondary | ICD-10-CM | POA: Diagnosis present

## 2016-04-28 DIAGNOSIS — R9389 Abnormal findings on diagnostic imaging of other specified body structures: Secondary | ICD-10-CM

## 2016-04-28 NOTE — Telephone Encounter (Signed)
Mr. Marcus Brooks , a 56 year old male with a history of seizure disorder, chronic pancreatitis, and poor nutrition was sent for a lung CT due to an abnormal chest xray. [name] was also cachexic with malnutrition as well. CT of lung had the following results:  1.4 x 1.7 cm nodule along the left lateral margin of the aortic large. The nodule is isodense with the aorta. Left hilar and AP window lymphadenopathy. Overall appearance is most concerning for primary lung malignancy. CT of the chest with intravenous contrast is recommended to completely exclude an aneurysm. These results will be called to the ordering clinician or representative by the Radiologist Assistant, and communication documented in the PACS or zVision Dashboard.   A referral was sent to oncology services for further workup and evaluation.  Patient is complaining of 8/10 abdominal pain, will evaluate in clinic on Thursday, 04/30/2016 at 11: 15 am.    Donia Pounds  MSN, FNP-C [facility] [place], [place] [address] [phone]

## 2016-04-30 ENCOUNTER — Ambulatory Visit (INDEPENDENT_AMBULATORY_CARE_PROVIDER_SITE_OTHER): Payer: Medicaid Other | Admitting: Family Medicine

## 2016-04-30 ENCOUNTER — Encounter: Payer: Self-pay | Admitting: Family Medicine

## 2016-04-30 VITALS — BP 116/71 | HR 70 | Temp 98.0°F | Resp 14 | Ht 67.0 in | Wt 94.0 lb

## 2016-04-30 DIAGNOSIS — F17200 Nicotine dependence, unspecified, uncomplicated: Secondary | ICD-10-CM | POA: Diagnosis not present

## 2016-04-30 DIAGNOSIS — R1084 Generalized abdominal pain: Secondary | ICD-10-CM

## 2016-04-30 DIAGNOSIS — R911 Solitary pulmonary nodule: Secondary | ICD-10-CM

## 2016-04-30 DIAGNOSIS — R64 Cachexia: Secondary | ICD-10-CM

## 2016-04-30 DIAGNOSIS — T85848S Pain due to other internal prosthetic devices, implants and grafts, sequela: Secondary | ICD-10-CM

## 2016-04-30 DIAGNOSIS — R590 Localized enlarged lymph nodes: Secondary | ICD-10-CM

## 2016-04-30 MED ORDER — OXYCODONE HCL 10 MG PO TABS: 10.0000 mg | ORAL_TABLET | Freq: Four times a day (QID) | ORAL | 0 refills | Status: DC | PRN

## 2016-04-30 NOTE — Progress Notes (Signed)
Subjective:    Patient ID: Marcus Brooks, male    DOB: [birth year], 56 y.o.   MRN: [id number]  HPI Mr. Marcus Brooks, a 56 year old male wtiha history of seizure disorder, chronic pancreatitis, and poor nutrition presents for abdominal pain. He says that abdominal pain is unbearable at times. Current pain intensity is 10/10 and constant. He has not attempted any OTC interventions to alleviate symptoms.  Patient was seen on 04/06/2016 for weight loss and nerve pain. He also complained of unspecific chest pain. A chest xray was ordered at the time, which shower left pleural effusion and a hazy density over the aortic arch. A lung CT was recommended to exclude an occult malignancy. A chest CT was done on 04/28/2016, which shows a 1.4 x 1.7 cm nodule along the left lateral margin of the aortic large. The nodule is isodense with the aorta. Left hilar and AP window lymphadenopathy. Overall appearance is most concerning for primary lung malignancy. Mr Marcus Brooks was informed of the findings prior to appointment. A referral was sent to oncology for further workup and evaluation.   Mr. 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 He states that he has not had alcohol in greater than 1 year. He had a left upper quadrant PEG tube placed during one of his pancreatitis flares.  He continues to have tenderness to the PEG insertion site. He is under the care of gastroenterology in [facility] with Dr. Hildred Laser. He has a colonoscopy scheduled for 05/22/2016.    Symptoms include occasional vomiting.  He also has irritation around his peg site. Aggravating factors include eating large meals. Prior diagnostic studies include abdominal CT on 03/13/2013. According to previous records, PEG tube was replaced on 02/19/2016.    Immunization History  Administered Date(s) Administered  . Influenza,inj,Quad PF,36+ Mos 01/10/2013  . PPD Test 01/27/2013  . Pneumococcal Polysaccharide-23 06/11/2015  . Tdap  09/12/2012   Past Surgical History:  Procedure Laterality Date  . CORONARY ANGIOPLASTY WITH STENT PLACEMENT    . ESOPHAGOGASTRODUODENOSCOPY N/A 01/18/2013   Procedure: ESOPHAGOGASTRODUODENOSCOPY (EGD);  Surgeon: Rogene Houston, MD;  [facility];  Service: Endoscopy;  Laterality: N/A;  . EUS  03/26/2009   [facility]  . GASTROSTOMY-JEJEUNOSTOMY TUBE CHANGE/PLACEMENT  12/11/2008   MICHAEL SHICK  . I&D EXTREMITY Left 09/12/2012   Procedure: IRRIGATION AND DEBRIDEMENT LEFT FIFTH FINGER;  Surgeon: Roseanne Kaufman, MD;  Location: [place];  Service: Orthopedics;  Laterality: Left;  . JEJUNOSTOMY FEEDING TUBE  07/13/2010  . NERVE AND TENDON REPAIR Left 09/12/2012   Procedure: NERVE AND TENDON REPAIR LEFT FIFTH FINGER;  Surgeon: Roseanne Kaufman, MD;  Location: [place];  Service: Orthopedics;  Laterality: Left;  . OPEN REDUCTION INTERNAL FIXATION (ORIF) METACARPAL Left 09/12/2012   Procedure: OPEN REDUCTION INTERNAL FIXATION LEFT FIFTH FINGER;  Surgeon: Roseanne Kaufman, MD;  Location: [place];  Service: Orthopedics;  Laterality: Left;  . PANCREATIC PSEUDOCYST DRAINAGE  07/13/2010  . PORTACATH PLACEMENT  11/28/2008   FLEISHMAN    Review of Systems  Constitutional: Negative.  Negative for fatigue and fever.  HENT: Negative.   Eyes: Negative.  Negative for photophobia.  Respiratory: Negative.  Negative for chest tightness.   Cardiovascular: Negative.  Negative for palpitations and leg swelling.  Gastrointestinal: Positive for abdominal pain.  Endocrine: Negative.  Negative for polydipsia, polyphagia and polyuria.  Genitourinary: Negative.   Musculoskeletal: Negative.   Skin: Negative.   Allergic/Immunologic: Negative.  Negative for immunocompromised state.  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  Hematological: Negative.   Psychiatric/Behavioral: Positive for sleep disturbance.       Objective:   Physical Exam  Constitutional: He is oriented  to person, place, and time. He appears well-developed. He appears cachectic. He has a sickly appearance.  HENT:  Head: Normocephalic and atraumatic.  Right Ear: External ear normal.  Left Ear: External ear normal.  Nose: Nose normal.  Mouth/Throat: Oropharynx is clear and moist. Abnormal dentition (partially edentulous).  Eyes: Conjunctivae and EOM are normal. Pupils are equal, round, and reactive to light.  Neck: Trachea normal and normal range of motion. Neck supple.  Cardiovascular: Normal rate, regular rhythm, normal heart sounds and intact distal pulses.   Pulmonary/Chest: Effort normal and breath sounds normal.  Abdominal: Soft. Bowel sounds are normal. There is tenderness in the right upper quadrant and left upper quadrant.  LUG PEG tube, pus, or edema, tender to palpation.  Mild erythema  Musculoskeletal: Normal range of motion.  Neurological: He is oriented to person, place, and time. He has normal reflexes. He is unresponsive.  Skin: Skin is warm, dry and intact. No rash noted. No cyanosis. There is pallor. Nails show no clubbing.  Psychiatric: His speech is normal and behavior is normal. Thought content normal. He exhibits a depressed mood.      BP 116/71 (BP Location: Right Arm, Patient Position: Sitting, Cuff Size: Small)   Pulse 70   Temp 98 F (36.7 C) (Oral)   Resp 14   Ht '5\' 7"'$  (1.702 m)   Wt 94 lb (42.6 kg)   SpO2 100%   BMI 14.72 kg/m  Assessment & Plan:  1. Lymphadenopathy, hilar Referral sent to oncology services for further workup and evaluation. Will call to find the status of referral.   2. Lung nodule Refer to #1.   3. Pain around percutaneous endoscopic gastrostomy (PEG) tube site, sequela Patient continues to use a feeding syringe 3-4 times daily to give himself formula. Recommend routine skin care 1-2 times daily to prevent skin breakdown. No pus, erythema, or edema to PEG site.   4. Generalized abdominal pain Patient says that abdominal pain is  unbearable at times. Pain intensity is 10/10.  Reviewed [place] Substance Reporting system prior to prescribing opiate medications. No inconsistencies noted.   - Oxycodone HCl 10 MG TABS; Take 1 tablet (10 mg total) by mouth every 6 (six) hours as needed.  Dispense: 30 tablet; Refill: 0  5. Tobacco use disorder Smoking cessation instruction/counseling given:  counseled patient on the dangers of tobacco use, advised patient to stop smoking, and reviewed strategies to maximize success  6. Cachexia ([place]) I suspect that cachexia is related to lung nodule. Sent referral to oncology for further work-up.  Patient has a colonoscopy scheduled for 05/22/2016, he was encouraged to follow up with gastroenterology.   Donia Pounds  MSN, FNP-C [facility] [place], [place] [address] [phone]   The patient was given clear instructions to go to ER or return to medical center if symptoms do not improve, worsen or new problems develop. The patient verbalized understanding.

## 2016-04-30 NOTE — Patient Instructions (Signed)
Will start Oxycodone 10 mg every 6 hours as needed for generalized pain. Do not drink alcohol, drive, or operate machinery while taking this medication.   Continue Ensure in PEG tube 3 times per day.    A referral has been sent to oncology for lung nodule.

## 2016-05-06 ENCOUNTER — Encounter: Payer: Self-pay | Admitting: Internal Medicine

## 2016-05-06 ENCOUNTER — Telehealth: Payer: Self-pay | Admitting: Internal Medicine

## 2016-05-06 NOTE — Telephone Encounter (Signed)
Appt has been scheduled for the pt to see Dr. Julien Nordmann on 5/9 at 215pm. Lft vm w/appt date and time. Letter mailed

## 2016-05-09 ENCOUNTER — Inpatient Hospital Stay (HOSPITAL_COMMUNITY)
Admission: EM | Admit: 2016-05-09 | Discharge: 2016-05-15 | DRG: 438 | Disposition: A | Discharge status: Home Health | Payer: Medicaid Other | Attending: Internal Medicine | Admitting: Internal Medicine

## 2016-05-09 ENCOUNTER — Encounter (HOSPITAL_COMMUNITY): Payer: Self-pay | Admitting: Emergency Medicine

## 2016-05-09 ENCOUNTER — Emergency Department (HOSPITAL_COMMUNITY): Payer: Medicaid Other

## 2016-05-09 DIAGNOSIS — K219 Gastro-esophageal reflux disease without esophagitis: Secondary | ICD-10-CM | POA: Diagnosis present

## 2016-05-09 DIAGNOSIS — K920 Hematemesis: Secondary | ICD-10-CM | POA: Diagnosis present

## 2016-05-09 DIAGNOSIS — D649 Anemia, unspecified: Secondary | ICD-10-CM | POA: Diagnosis present

## 2016-05-09 DIAGNOSIS — K869 Disease of pancreas, unspecified: Secondary | ICD-10-CM

## 2016-05-09 DIAGNOSIS — F1010 Alcohol abuse, uncomplicated: Secondary | ICD-10-CM | POA: Diagnosis present

## 2016-05-09 DIAGNOSIS — E46 Unspecified protein-calorie malnutrition: Secondary | ICD-10-CM

## 2016-05-09 DIAGNOSIS — R1084 Generalized abdominal pain: Secondary | ICD-10-CM

## 2016-05-09 DIAGNOSIS — R1010 Upper abdominal pain, unspecified: Secondary | ICD-10-CM

## 2016-05-09 DIAGNOSIS — F319 Bipolar disorder, unspecified: Secondary | ICD-10-CM | POA: Diagnosis present

## 2016-05-09 DIAGNOSIS — Z931 Gastrostomy status: Secondary | ICD-10-CM

## 2016-05-09 DIAGNOSIS — I82891 Chronic embolism and thrombosis of other specified veins: Secondary | ICD-10-CM | POA: Diagnosis present

## 2016-05-09 DIAGNOSIS — Z955 Presence of coronary angioplasty implant and graft: Secondary | ICD-10-CM

## 2016-05-09 DIAGNOSIS — G40909 Epilepsy, unspecified, not intractable, without status epilepticus: Secondary | ICD-10-CM

## 2016-05-09 DIAGNOSIS — R64 Cachexia: Secondary | ICD-10-CM | POA: Diagnosis present

## 2016-05-09 DIAGNOSIS — E43 Unspecified severe protein-calorie malnutrition: Secondary | ICD-10-CM | POA: Diagnosis present

## 2016-05-09 DIAGNOSIS — Z681 Body mass index (BMI) 19 or less, adult: Secondary | ICD-10-CM

## 2016-05-09 DIAGNOSIS — R590 Localized enlarged lymph nodes: Secondary | ICD-10-CM | POA: Diagnosis present

## 2016-05-09 DIAGNOSIS — K861 Other chronic pancreatitis: Secondary | ICD-10-CM

## 2016-05-09 DIAGNOSIS — G629 Polyneuropathy, unspecified: Secondary | ICD-10-CM | POA: Diagnosis present

## 2016-05-09 DIAGNOSIS — F17200 Nicotine dependence, unspecified, uncomplicated: Secondary | ICD-10-CM | POA: Diagnosis present

## 2016-05-09 DIAGNOSIS — K860 Alcohol-induced chronic pancreatitis: Secondary | ICD-10-CM | POA: Diagnosis present

## 2016-05-09 DIAGNOSIS — R591 Generalized enlarged lymph nodes: Secondary | ICD-10-CM | POA: Diagnosis present

## 2016-05-09 DIAGNOSIS — G894 Chronic pain syndrome: Secondary | ICD-10-CM | POA: Diagnosis present

## 2016-05-09 DIAGNOSIS — K859 Acute pancreatitis without necrosis or infection, unspecified: Principal | ICD-10-CM | POA: Diagnosis present

## 2016-05-09 DIAGNOSIS — Z91040 Latex allergy status: Secondary | ICD-10-CM

## 2016-05-09 DIAGNOSIS — F17210 Nicotine dependence, cigarettes, uncomplicated: Secondary | ICD-10-CM | POA: Diagnosis present

## 2016-05-09 DIAGNOSIS — K210 Gastro-esophageal reflux disease with esophagitis: Secondary | ICD-10-CM | POA: Diagnosis present

## 2016-05-09 DIAGNOSIS — J449 Chronic obstructive pulmonary disease, unspecified: Secondary | ICD-10-CM | POA: Diagnosis present

## 2016-05-09 DIAGNOSIS — Z79899 Other long term (current) drug therapy: Secondary | ICD-10-CM

## 2016-05-09 DIAGNOSIS — Z7401 Bed confinement status: Secondary | ICD-10-CM

## 2016-05-09 DIAGNOSIS — R739 Hyperglycemia, unspecified: Secondary | ICD-10-CM | POA: Diagnosis present

## 2016-05-09 DIAGNOSIS — I700 Atherosclerosis of aorta: Secondary | ICD-10-CM | POA: Diagnosis present

## 2016-05-09 DIAGNOSIS — K449 Diaphragmatic hernia without obstruction or gangrene: Secondary | ICD-10-CM | POA: Diagnosis present

## 2016-05-09 DIAGNOSIS — Z5329 Procedure and treatment not carried out because of patient's decision for other reasons: Secondary | ICD-10-CM | POA: Diagnosis not present

## 2016-05-09 DIAGNOSIS — I2510 Atherosclerotic heart disease of native coronary artery without angina pectoris: Secondary | ICD-10-CM | POA: Diagnosis present

## 2016-05-09 DIAGNOSIS — R918 Other nonspecific abnormal finding of lung field: Secondary | ICD-10-CM | POA: Diagnosis present

## 2016-05-09 DIAGNOSIS — F70 Mild intellectual disabilities: Secondary | ICD-10-CM | POA: Diagnosis present

## 2016-05-09 DIAGNOSIS — I10 Essential (primary) hypertension: Secondary | ICD-10-CM | POA: Diagnosis present

## 2016-05-09 DIAGNOSIS — F419 Anxiety disorder, unspecified: Secondary | ICD-10-CM | POA: Diagnosis present

## 2016-05-09 DIAGNOSIS — Z86010 Personal history of colonic polyps: Secondary | ICD-10-CM

## 2016-05-09 HISTORY — DX: Unspecified protein-calorie malnutrition: E46

## 2016-05-09 HISTORY — DX: Unspecified intellectual disabilities: F79

## 2016-05-09 LAB — CBC WITH DIFFERENTIAL/PLATELET
BASOS ABS: 0 10*3/uL (ref 0.0–0.1)
Basophils Relative: 0 %
Eosinophils Absolute: 0.1 10*3/uL (ref 0.0–0.7)
Eosinophils Relative: 1 %
HEMATOCRIT: 36.3 % — AB (ref 39.0–52.0)
HEMOGLOBIN: 12.2 g/dL — AB (ref 13.0–17.0)
LYMPHS PCT: 14 %
Lymphs Abs: 1.6 10*3/uL (ref 0.7–4.0)
MCH: 31.9 pg (ref 26.0–34.0)
MCHC: 33.6 g/dL (ref 30.0–36.0)
MCV: 94.8 fL (ref 78.0–100.0)
Monocytes Absolute: 0.9 10*3/uL (ref 0.1–1.0)
Monocytes Relative: 8 %
NEUTROS ABS: 9.4 10*3/uL — AB (ref 1.7–7.7)
NEUTROS PCT: 77 %
PLATELETS: 181 10*3/uL (ref 150–400)
RBC: 3.83 MIL/uL — ABNORMAL LOW (ref 4.22–5.81)
RDW: 14.5 % (ref 11.5–15.5)
WBC: 12 10*3/uL — AB (ref 4.0–10.5)

## 2016-05-09 LAB — URINALYSIS, ROUTINE W REFLEX MICROSCOPIC
BACTERIA UA: NONE SEEN
Glucose, UA: NEGATIVE mg/dL
Hgb urine dipstick: NEGATIVE
Ketones, ur: NEGATIVE mg/dL
LEUKOCYTES UA: NEGATIVE
Nitrite: NEGATIVE
PH: 7 (ref 5.0–8.0)
Protein, ur: 100 mg/dL — AB
SQUAMOUS EPITHELIAL / LPF: NONE SEEN
Specific Gravity, Urine: 1.024 (ref 1.005–1.030)

## 2016-05-09 LAB — COMPREHENSIVE METABOLIC PANEL
ALT: 13 U/L — ABNORMAL LOW (ref 17–63)
AST: 24 U/L (ref 15–41)
Albumin: 3.6 g/dL (ref 3.5–5.0)
Alkaline Phosphatase: 143 U/L — ABNORMAL HIGH (ref 38–126)
Anion gap: 11 (ref 5–15)
BUN: 17 mg/dL (ref 6–20)
CHLORIDE: 101 mmol/L (ref 101–111)
CO2: 26 mmol/L (ref 22–32)
Calcium: 8.9 mg/dL (ref 8.9–10.3)
Creatinine, Ser: 0.79 mg/dL (ref 0.61–1.24)
Glucose, Bld: 112 mg/dL — ABNORMAL HIGH (ref 65–99)
POTASSIUM: 3.9 mmol/L (ref 3.5–5.1)
Sodium: 138 mmol/L (ref 135–145)
Total Bilirubin: 0.7 mg/dL (ref 0.3–1.2)
Total Protein: 7.5 g/dL (ref 6.5–8.1)

## 2016-05-09 LAB — POC OCCULT BLOOD, ED: FECAL OCCULT BLD: NEGATIVE

## 2016-05-09 LAB — I-STAT TROPONIN, ED: Troponin i, poc: 0 ng/mL (ref 0.00–0.08)

## 2016-05-09 LAB — I-STAT CG4 LACTIC ACID, ED: LACTIC ACID, VENOUS: 1.86 mmol/L (ref 0.5–1.9)

## 2016-05-09 LAB — OCCULT BLOOD GASTRIC / DUODENUM (SPECIMEN CUP): Occult Blood, Gastric: POSITIVE — AB

## 2016-05-09 LAB — LIPASE, BLOOD: LIPASE: 63 U/L — AB (ref 11–51)

## 2016-05-09 MED ORDER — IOPAMIDOL (ISOVUE-300) INJECTION 61%
INTRAVENOUS | Status: AC
  Administered 2016-05-09: 75 mL
  Filled 2016-05-09: qty 100

## 2016-05-09 MED ORDER — ACETAMINOPHEN 325 MG PO TABS: 650.0000 mg | ORAL_TABLET | Freq: Four times a day (QID) | ORAL | Status: DC | PRN

## 2016-05-09 MED ORDER — HYDROMORPHONE HCL 1 MG/ML IJ SOLN
0.5000 mg | INTRAMUSCULAR | Status: DC | PRN
  Administered 2016-05-09 – 2016-05-15 (×27): 0.5 mg via INTRAVENOUS
  Filled 2016-05-09 (×27): qty 1

## 2016-05-09 MED ORDER — FENTANYL CITRATE (PF) 100 MCG/2ML IJ SOLN
50.0000 ug | Freq: Once | INTRAMUSCULAR | Status: AC
  Administered 2016-05-09: 50 ug via INTRAVENOUS
  Filled 2016-05-09: qty 2

## 2016-05-09 MED ORDER — ONDANSETRON HCL 4 MG PO TABS: 4.0000 mg | ORAL_TABLET | Freq: Four times a day (QID) | ORAL | Status: DC | PRN

## 2016-05-09 MED ORDER — ALBUTEROL SULFATE (2.5 MG/3ML) 0.083% IN NEBU: 2.5000 mg | INHALATION_SOLUTION | Freq: Four times a day (QID) | RESPIRATORY_TRACT | Status: DC | PRN

## 2016-05-09 MED ORDER — ENOXAPARIN SODIUM 40 MG/0.4ML ~~LOC~~ SOLN: 40.0000 mg | SUBCUTANEOUS | Status: DC

## 2016-05-09 MED ORDER — MORPHINE SULFATE (PF) 4 MG/ML IV SOLN
2.0000 mg | Freq: Once | INTRAVENOUS | Status: AC
  Administered 2016-05-09: 2 mg via INTRAVENOUS
  Filled 2016-05-09: qty 1

## 2016-05-09 MED ORDER — ONDANSETRON HCL 4 MG/2ML IJ SOLN
4.0000 mg | Freq: Four times a day (QID) | INTRAMUSCULAR | Status: DC | PRN
  Administered 2016-05-09 – 2016-05-15 (×11): 4 mg via INTRAVENOUS
  Filled 2016-05-09 (×10): qty 2

## 2016-05-09 MED ORDER — TRAZODONE HCL 100 MG PO TABS
200.0000 mg | ORAL_TABLET | Freq: Every day | ORAL | Status: DC
  Administered 2016-05-09: 200 mg via ORAL
  Filled 2016-05-09 (×2): qty 2

## 2016-05-09 MED ORDER — PHENYTOIN SODIUM EXTENDED 100 MG PO CAPS
300.0000 mg | ORAL_CAPSULE | Freq: Every day | ORAL | Status: DC
  Administered 2016-05-09: 300 mg via ORAL
  Filled 2016-05-09 (×2): qty 3

## 2016-05-09 MED ORDER — SODIUM CHLORIDE 0.9 % IV SOLN
INTRAVENOUS | Status: AC
  Administered 2016-05-09 – 2016-05-10 (×3): via INTRAVENOUS

## 2016-05-09 MED ORDER — PROMETHAZINE HCL 25 MG PO TABS: 25.0000 mg | ORAL_TABLET | Freq: Four times a day (QID) | ORAL | Status: DC | PRN

## 2016-05-09 MED ORDER — ALBUTEROL SULFATE HFA 108 (90 BASE) MCG/ACT IN AERS: 2.0000 | INHALATION_SPRAY | Freq: Four times a day (QID) | RESPIRATORY_TRACT | Status: DC | PRN

## 2016-05-09 MED ORDER — OXYCODONE HCL 5 MG PO TABS: 10.0000 mg | ORAL_TABLET | Freq: Four times a day (QID) | ORAL | Status: DC | PRN

## 2016-05-09 MED ORDER — SODIUM CHLORIDE 0.9 % IV BOLUS (SEPSIS)
1000.0000 mL | Freq: Once | INTRAVENOUS | Status: AC
  Administered 2016-05-09: 1000 mL via INTRAVENOUS

## 2016-05-09 MED ORDER — ACETAMINOPHEN 650 MG RE SUPP: 650.0000 mg | Freq: Four times a day (QID) | RECTAL | Status: DC | PRN

## 2016-05-09 MED ORDER — LORAZEPAM 2 MG/ML IJ SOLN: 2.0000 mg | INTRAMUSCULAR | Status: DC | PRN

## 2016-05-09 MED ORDER — ONDANSETRON HCL 4 MG/2ML IJ SOLN
4.0000 mg | Freq: Once | INTRAMUSCULAR | Status: AC
  Administered 2016-05-09: 4 mg via INTRAVENOUS
  Filled 2016-05-09: qty 2

## 2016-05-09 MED ORDER — SODIUM CHLORIDE 0.9 % IV SOLN
1.0000 g | Freq: Three times a day (TID) | INTRAVENOUS | Status: DC
  Administered 2016-05-09 – 2016-05-11 (×6): 1 g via INTRAVENOUS
  Filled 2016-05-09 (×8): qty 1

## 2016-05-09 MED ORDER — SODIUM CHLORIDE 0.9% FLUSH
3.0000 mL | Freq: Two times a day (BID) | INTRAVENOUS | Status: DC
  Administered 2016-05-10 – 2016-05-14 (×4): 3 mL via INTRAVENOUS
  Administered 2016-05-15: 10 mL via INTRAVENOUS

## 2016-05-09 NOTE — ED Triage Notes (Signed)
Pt/fiancee' stateed, he's been having chest pain, abdominal pain and losing weight for the last 6 months and is getting worse. Pt. Sees Dr. Smith Robert , last seen last Monday.

## 2016-05-09 NOTE — ED Notes (Signed)
Patient transported to CT 

## 2016-05-09 NOTE — ED Notes (Signed)
ED Provider at bedside. 

## 2016-05-09 NOTE — Consult Note (Signed)
Consult Note for [facility]  Reason for Consult: Pancreatic phlegmon Referring Physician: [facility]  Ranjit Fritz Pickerel HPI: This is a 56 year old male with a PMH significant for chronic calcific pancreatitis secondary to ETOH abuse, chronic pain, protein-calorie malnutrition, s/p G- tube placement, chronic splenic vein thrombosis, and a new diagnosis of a left upper lobe lung mass (1.5 cm) suspicious for a primary lung cancer admitted for nausea, vomiting, and abdominal pain.  His symptoms started approximately one week ago and for the past 2 days he has vomited.  Vomiting is a chronic issue even when he is relatively asymptomatic, but he had persistent vomiting.  Cachexia is a problem for him.  The last time he drank ETOH was 1-2 years ago.  In 03/2013 he had a similar finding on his CT scan and he was treated at [facility].  In the ER he was noted to have a mild elevation in his WBC at 12.  No evidence of fever, but it seems that he has necrosis within the phlegmon measuring 2 cm.  No reports of any gas bubbles.  Past Medical History:  Diagnosis Date  . Anxiety states 08/04/2010  . Chronic airway obstruction, not elsewhere classified 08/04/2010  . Chronic pain syndrome 08/04/2010  . Chronic pancreatitis ([place]) 08/04/2010  . Coronary artery disease   . GERD (gastroesophageal reflux disease)   . Hiatal hernia    EGD 01/18/13  . Insomnia 08/04/2010  . Other abnormal glucose 08/04/2010  . Pancreatitis chronic 08/04/2010  . Seizure ([place]) 08/04/2010  . Splenic vein thrombosis    Chronic.  . Tobacco use disorder 08/04/2010  . Unspecified essential hypertension 08/04/2010    Past Surgical History:  Procedure Laterality Date  . CORONARY ANGIOPLASTY WITH STENT PLACEMENT    . ESOPHAGOGASTRODUODENOSCOPY N/A 01/18/2013   Procedure: ESOPHAGOGASTRODUODENOSCOPY (EGD);  Surgeon: Rogene Houston, MD;  [facility];  Service: Endoscopy;  Laterality: N/A;  . EUS  03/26/2009   [facility]   . GASTROSTOMY-JEJEUNOSTOMY TUBE CHANGE/PLACEMENT  12/11/2008   MICHAEL SHICK  . I&D EXTREMITY Left 09/12/2012   Procedure: IRRIGATION AND DEBRIDEMENT LEFT FIFTH FINGER;  Surgeon: Roseanne Kaufman, MD;  Location: [place];  Service: Orthopedics;  Laterality: Left;  . JEJUNOSTOMY FEEDING TUBE  07/13/2010  . NERVE AND TENDON REPAIR Left 09/12/2012   Procedure: NERVE AND TENDON REPAIR LEFT FIFTH FINGER;  Surgeon: Roseanne Kaufman, MD;  Location: [place];  Service: Orthopedics;  Laterality: Left;  . OPEN REDUCTION INTERNAL FIXATION (ORIF) METACARPAL Left 09/12/2012   Procedure: OPEN REDUCTION INTERNAL FIXATION LEFT FIFTH FINGER;  Surgeon: Roseanne Kaufman, MD;  Location: [place];  Service: Orthopedics;  Laterality: Left;  . PANCREATIC PSEUDOCYST DRAINAGE  07/13/2010  . PORTACATH PLACEMENT  11/28/2008   FLEISHMAN    No family history on file.  Social History:  reports that he has been smoking Cigarettes.  He has a 20.00 pack-year smoking history. He has never used smokeless tobacco. He reports that he does not drink alcohol or use drugs.  Allergies:  Allergies  Allergen Reactions  . Latex Rash    Medications:  Scheduled: . phenytoin  300 mg Oral Daily  . sodium chloride flush  3 mL Intravenous Q12H   Continuous: . sodium chloride 125 mL/hr at 05/09/16 1411    Results for orders placed or performed during the hospital encounter of 05/09/16 (from the past 24 hour(s))  Lipase, blood     Status: Abnormal   Collection Time: 05/09/16 11:52 AM  Result Value Ref Range   Lipase 63 (H) 11 - 51 U/L  Comprehensive metabolic panel     Status: Abnormal   Collection Time: 05/09/16 11:52 AM  Result Value Ref Range   Sodium 138 135 - 145 mmol/L   Potassium 3.9 3.5 - 5.1 mmol/L   Chloride 101 101 - 111 mmol/L   CO2 26 22 - 32 mmol/L   Glucose, Bld 112 (H) 65 - 99 mg/dL   BUN 17 6 - 20 mg/dL   Creatinine, Ser 0.79 0.61 - 1.24 mg/dL   Calcium 8.9 8.9 - 10.3 mg/dL   Total Protein 7.5 6.5 - 8.1 g/dL   Albumin  3.6 3.5 - 5.0 g/dL   AST 24 15 - 41 U/L   ALT 13 (L) 17 - 63 U/L   Alkaline Phosphatase 143 (H) 38 - 126 U/L   Total Bilirubin 0.7 0.3 - 1.2 mg/dL   GFR calc non Af Amer >60 >60 mL/min   GFR calc Af Amer >60 >60 mL/min   Anion gap 11 5 - 15  CBC WITH DIFFERENTIAL     Status: Abnormal   Collection Time: 05/09/16 11:52 AM  Result Value Ref Range   WBC 12.0 (H) 4.0 - 10.5 K/uL   RBC 3.83 (L) 4.22 - 5.81 MIL/uL   Hemoglobin 12.2 (L) 13.0 - 17.0 g/dL   HCT 36.3 (L) 39.0 - 52.0 %   MCV 94.8 78.0 - 100.0 fL   MCH 31.9 26.0 - 34.0 pg   MCHC 33.6 30.0 - 36.0 g/dL   RDW 14.5 11.5 - 15.5 %   Platelets 181 150 - 400 K/uL   Neutrophils Relative % 77 %   Neutro Abs 9.4 (H) 1.7 - 7.7 K/uL   Lymphocytes Relative 14 %   Lymphs Abs 1.6 0.7 - 4.0 K/uL   Monocytes Relative 8 %   Monocytes Absolute 0.9 0.1 - 1.0 K/uL   Eosinophils Relative 1 %   Eosinophils Absolute 0.1 0.0 - 0.7 K/uL   Basophils Relative 0 %   Basophils Absolute 0.0 0.0 - 0.1 K/uL  I-stat troponin, ED     Status: None   Collection Time: 05/09/16 12:09 PM  Result Value Ref Range   Troponin i, poc 0.00 0.00 - 0.08 ng/mL   Comment 3          I-Stat CG4 Lactic Acid, ED  (not at [facility])     Status: None   Collection Time: 05/09/16 12:11 PM  Result Value Ref Range   Lactic Acid, Venous 1.86 0.5 - 1.9 mmol/L  Urinalysis, Routine w reflex microscopic     Status: Abnormal   Collection Time: 05/09/16 12:58 PM  Result Value Ref Range   Color, Urine AMBER (A) YELLOW   APPearance CLEAR CLEAR   Specific Gravity, Urine 1.024 1.005 - 1.030   pH 7.0 5.0 - 8.0   Glucose, UA NEGATIVE NEGATIVE mg/dL   Hgb urine dipstick NEGATIVE NEGATIVE   Bilirubin Urine SMALL (A) NEGATIVE   Ketones, ur NEGATIVE NEGATIVE mg/dL   Protein, ur 100 (A) NEGATIVE mg/dL   Nitrite NEGATIVE NEGATIVE   Leukocytes, UA NEGATIVE NEGATIVE   RBC / HPF 0-5 0 - 5 RBC/hpf   WBC, UA 0-5 0 - 5 WBC/hpf   Bacteria, UA NONE SEEN NONE SEEN   Squamous Epithelial / LPF NONE  SEEN NONE SEEN   Mucous PRESENT   Occult blood gastric / duodenum     Status: Abnormal   Collection Time: 05/09/16  3:19 PM  Result Value Ref Range   Occult Blood, Gastric POSITIVE (A) NEGATIVE  POC occult blood, ED     Status: None   Collection Time: 05/09/16  3:39 PM  Result Value Ref Range   Fecal Occult Bld NEGATIVE NEGATIVE     Dg Chest 2 View  Result Date: 05/09/2016 CLINICAL DATA:  Chest pain and weight loss EXAM: CHEST  2 VIEW COMPARISON:  Chest radiograph April 06, 2016 and chest CT April 28, 2016 FINDINGS: There is stable mild blunting of the left costophrenic angle. There is no edema or consolidation. Nodular opacity adjacent to the aortic arch on the left is appreciable by radiography but better seen by CT. Heart size is normal. The pulmonary vascularity is normal. There is left hilar adenopathy, stable. No new adenopathy evident. No bone lesions. Coils are noted in the upper abdomen on the left. There are multiple calcified splenic granulomas. There is aortic atherosclerosis. IMPRESSION: Persistent left hilar adenopathy. Stable nodular opacity adjacent to the aortic arch on the left. No edema or consolidation. Mild scarring lateral left base. Aortic atherosclerosis. Calcified splenic granulomas present. Electronically Signed   By: Lowella Grip III M.D.   On: 05/09/2016 11:26   Ct Abdomen Pelvis W Contrast  Result Date: 05/09/2016 CLINICAL DATA:  Hemoptysis and abdominal pain over the last 2 days. Recent weight loss. EXAM: CT ABDOMEN AND PELVIS WITH CONTRAST TECHNIQUE: Multidetector CT imaging of the abdomen and pelvis was performed using the standard protocol following bolus administration of intravenous contrast. CONTRAST:  78m ISOVUE-300 IOPAMIDOL (ISOVUE-300) INJECTION 61% COMPARISON:  11/29/2014. FINDINGS: Lower chest: Lung bases are clear.  No pleural or pericardial fluid. Hepatobiliary: Multiple scattered hepatic granulomas as seen previously. No acute liver finding. No  calcified gallstones. Pancreas: Chronic pancreatic calcifications. There appears to be pronounced active inflammation in the lesser sac with a 6-7 cm phlegmonous inflammatory mass with a central low density measuring about 2 cm that could represent actual abscess. This indents the posterior wall of the stomach. Spleen: Multiple calcified granulomas.  Otherwise unremarkable. Adrenals/Urinary Tract: Adrenal glands are normal. Kidneys are normal. Stomach/Bowel: Gastrostomy tube in the stomach. No evidence of bowel obstruction or primary bowel pathology. Vascular/Lymphatic: Aortic atherosclerosis.  No aneurysm. Reproductive: Negative Other: No free fluid or air. Musculoskeletal: Negative IMPRESSION: Acute inflammation in the lesser sac region, likely secondary to chronic pancreatitis. Phlegmonous inflammatory mass measuring approximately 7 cm in diameter. Central nonenhancing region measuring about 2 cm in diameter that could represent a central abscess. Electronically Signed   By: MNelson ChimesM.D.   On: 05/09/2016 15:36    ROS:  As stated above in the HPI otherwise negative.  Blood pressure 122/67, pulse (!) 59, temperature 97.6 F (36.4 C), resp. rate 13, height '5\' 6"'$  (1.676 m), weight 43.1 kg (95 lb), SpO2 97 %.    PE: Gen: NAD, Alert and Oriented HEENT:  [place]/AT, EOMI Neck: Supple, no LAD Lungs: CTA Bilaterally CV: RRR without M/G/R ABM: Soft, NTND, +BS Ext: No C/C/E  Assessment/Plan: 1) Pancreatic phlegmon. 2) Chronic calcific pancreatitis. 3) ABM pain.   I reviewed the patient's chart and his CT scan with Radiology.  In 03/13/2013 he had a similar, if not worse, finding and [facility]  It seems that he was treated with TPN and antibiotics, but I do not have access to all the records.  A GJ tube was also placed at that time, but historically, he had placement of a G tube in  2010.  He reports his last use of ETOH one year ago.  There is no evidence of pancreatic necrosis, but  it is difficult to read in this patient, per Radiology.  There is no definite wall around the phlegmon to classify it as a WOPN (Walled Off Pancreatic Necrosis).  There appears to be a central area of necrosis.  The best course of action is to treat with antibiotics and TPN.  Surgery and IR should also weigh in on this patient's care as this finding is typically multidisciplinary.  If after 4 weeks, the fluid collection persists and a wall develops, he may be amenable to endoscopic drainage at [facility], if he continues to have symptoms.  Plan: 1) Dietary and Pharmacy consultation for TPN. 2) Surgery and IR consultations.  ? Drainage. 3) Start meropenem for now.  Ammar Moffatt D 05/09/2016, 5:13 PM

## 2016-05-09 NOTE — ED Notes (Signed)
notified CT pt ready for transport

## 2016-05-09 NOTE — ED Provider Notes (Signed)
[place] DEPT Provider Note   CSN: [id number] Arrival date & time: 05/09/16  1049     History   Chief Complaint Chief Complaint  Patient presents with  . Chest Pain  . Abdominal Pain  . Weakness  . Weight Loss    HPI Marcus Brooks is a 56 y.o. male.  HPI This 56 year old man with a history of chronic pancreatitis who presents today with increased abdominal pain and weight loss. When seen by his primary care doctor in the past several weeks and had a CT of his chest on April 17 with an area concerning for a primary lung cancer. Today he states that his pain has worsened t and he is vomiting with any by mouth intake.  He denies any diarrhea or red blood per rectum. He has had some blood in his emesis. Past Medical History:  Diagnosis Date  . Anxiety states 08/04/2010  . Chronic airway obstruction, not elsewhere classified 08/04/2010  . Chronic pain syndrome 08/04/2010  . Chronic pancreatitis ([place]) 08/04/2010  . Coronary artery disease   . GERD (gastroesophageal reflux disease)   . Hiatal hernia    EGD 01/18/13  . Insomnia 08/04/2010  . Other abnormal glucose 08/04/2010  . Pancreatitis chronic 08/04/2010  . Seizure ([place]) 08/04/2010  . Splenic vein thrombosis    Chronic.  . Tobacco use disorder 08/04/2010  . Unspecified essential hypertension 08/04/2010    Patient Active Problem List   Diagnosis Date Noted  . Hx of colonic polyps 04/23/2016  . Atherosclerosis of aorta ([place]) 04/09/2016  . History of colonic polyps 02/19/2016  . Dizziness 06/10/2015  . Pancreatic pseudocyst 03/13/2013  . Cellulitis 01/24/2013  . Gastritis 01/24/2013  . Pancreatitis 01/23/2013  . Irritation around percutaneous endoscopic gastrostomy (PEG) tube site ([place]) 01/23/2013  . Hyponatremia 01/19/2013  . Hiatal hernia 01/19/2013  . Acute blood loss anemia 01/18/2013  . 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 Coronary artery disease   . Upper GI bleed 01/17/2013  . Chronic pancreatitis  ([place]) 01/17/2013  . Protein-calorie malnutrition, severe ([place]) 01/10/2013  . Hematemesis 01/09/2013  . Pancreatitis, acute 01/09/2013  . Seizure disorder ([place]) 01/09/2013  . Syncope 01/09/2013  . Acute pancreatitis 10/03/2012  . Leukocytosis 10/03/2012  . Transaminitis 10/03/2012  . Chest pain 10/03/2012  . Pain around PEG tube site 03/09/2011  . Hypertension 11/05/2010  . GERD (gastroesophageal reflux disease) 11/05/2010  . Chronic pain syndrome 08/04/2010  . Tobacco use disorder 08/04/2010    Past Surgical History:  Procedure Laterality Date  . CORONARY ANGIOPLASTY WITH STENT PLACEMENT    . ESOPHAGOGASTRODUODENOSCOPY N/A 01/18/2013   Procedure: ESOPHAGOGASTRODUODENOSCOPY (EGD);  Surgeon: Rogene Houston, MD;  [facility];  Service: Endoscopy;  Laterality: N/A;  . EUS  03/26/2009   [facility]  . GASTROSTOMY-JEJEUNOSTOMY TUBE CHANGE/PLACEMENT  12/11/2008   MICHAEL SHICK  . I&D EXTREMITY Left 09/12/2012   Procedure: IRRIGATION AND DEBRIDEMENT LEFT FIFTH FINGER;  Surgeon: Roseanne Kaufman, MD;  Location: [place];  Service: Orthopedics;  Laterality: Left;  . JEJUNOSTOMY FEEDING TUBE  07/13/2010  . NERVE AND TENDON REPAIR Left 09/12/2012   Procedure: NERVE AND TENDON REPAIR LEFT FIFTH FINGER;  Surgeon: Roseanne Kaufman, MD;  Location: [place];  Service: Orthopedics;  Laterality: Left;  . OPEN REDUCTION INTERNAL FIXATION (ORIF) METACARPAL Left 09/12/2012   Procedure: OPEN REDUCTION INTERNAL FIXATION LEFT FIFTH FINGER;  Surgeon: Roseanne Kaufman, MD;  Location: [place];  Service: Orthopedics;  Laterality: Left;  . PANCREATIC PSEUDOCYST DRAINAGE  07/13/2010  .  PORTACATH PLACEMENT  11/28/2008   FLEISHMAN       Home Medications    Prior to Admission medications   Medication Sig Start Date End Date Taking? Authorizing Provider  albuterol (PROVENTIL HFA;VENTOLIN HFA) 108 (90 BASE) MCG/ACT inhaler Inhale 2 puffs into the lungs every 6 (six) hours as needed for wheezing or shortness of  breath.    Yes Historical Provider, MD  amLODipine (NORVASC) 10 MG tablet TAKE ONE (1) TABLET BY MOUTH EVERY DAY 04/15/16  Yes [name], FNP  CREON [phone] units CPEP TAKE TWO CAPSULES BY MOUTH BEFORE MEALS 04/15/16  Yes [name], FNP  DULoxetine (CYMBALTA) 30 MG capsule Take 1 capsule (30 mg total) by mouth 2 (two) times daily. 04/06/16  Yes [name], FNP  ferrous sulfate 325 (65 FE) MG tablet TAKE ONE (1) TABLET BY MOUTH EVERY DAY WITH BREAKFAST Patient taking differently: Take 325 mg by mouth daily with breakfast. TAKE ONE (1) TABLET BY MOUTH EVERY DAY WITH BREAKFAST 02/12/16  Yes Micheline Chapman, NP  gabapentin (NEURONTIN) 300 MG capsule Take 1 capsule (300 mg total) by mouth 4 (four) times daily as needed. Patient taking differently: Take 600 mg by mouth 3 (three) times daily.  01/28/16  Yes [name], FNP  hydrocortisone cream 0.5 % Apply 1 application topically 2 (two) times daily. 02/28/16  Yes [name], FNP  mirtazapine (REMERON) 15 MG tablet Take 15 mg by mouth at bedtime.   Yes Historical Provider, MD  Oxycodone HCl 10 MG TABS Take 1 tablet (10 mg total) by mouth every 6 (six) hours as needed. 04/30/16  Yes [name], FNP  pantoprazole (PROTONIX) 40 MG tablet TAKE ONE (1) TABLET BY MOUTH EVERY DAY 04/15/16  Yes [name], FNP  phenytoin (DILANTIN) 100 MG ER capsule TAKE THREE CAPSULES BY MOUTH DAILY 04/15/16  Yes [name], FNP  potassium chloride SA (K-DUR,KLOR-CON) 20 MEQ tablet TAKE TWO (2) TABLETS BY MOUTH DAILY 03/18/16  Yes Micheline Chapman, NP  promethazine (PHENERGAN) 25 MG tablet Take 25 mg by mouth every 6 (six) hours as needed for nausea or vomiting.    Yes Historical Provider, MD  ranitidine (ZANTAC) 150 MG capsule TAKE 1 CAPSULE BY MOUTH TWICE DAILY Patient taking differently: TAKE 1 CAPSULE BY MOUTH AT BEDTIME 06/13/15  Yes Micheline Chapman, NP  traZODone (DESYREL) 100 MG tablet Take 2 tablets (200 mg total) by mouth at  bedtime. 02/12/16  Yes Micheline Chapman, NP  vitamin C (ASCORBIC ACID) 500 MG tablet Take 1 tablet (500 mg total) by mouth daily. 11/20/15  Yes Micheline Chapman, NP  Adhesive Tape (CLOTH ADHESIVE SURG 1/2"X10YD) TAPE 1 each by Does not apply route 2 (two) times daily as needed. 03/09/16   [name], FNP  amitriptyline (ELAVIL) 25 MG tablet Take 1 tablet (25 mg total) by mouth at bedtime. Patient not taking: Reported on 05/09/2016 02/12/16   Micheline Chapman, NP  Gauze Pads & Dressings ([place]) 4"X4" PADS 1 each by Does not apply route 2 (two) times daily. 03/09/16   [name], FNP    Family History No family history on file.  Social History Social History  Substance Use Topics  . Smoking status: Current Every Day Smoker    Packs/day: 1.00    Years: 20.00    Types: Cigarettes  . Smokeless tobacco: Never Used     Comment: 1 pack a day since age 50  .  Alcohol use No     Comment: No etoh since 5-6 yrs     Allergies   Latex   Review of Systems Review of Systems  Constitutional: Positive for activity change and appetite change.  HENT: Negative.   Eyes: Negative.   Respiratory: Positive for cough.   Cardiovascular: Negative.   Gastrointestinal: Positive for abdominal pain.  Endocrine: Negative.   Genitourinary: Negative.   Musculoskeletal: Negative.   Skin: Negative.   Allergic/Immunologic: Negative.   Neurological: Negative.   Hematological: Negative.   Psychiatric/Behavioral: Negative.   All other systems reviewed and are negative.    Physical Exam Updated Vital Signs BP 132/68   Pulse (!) 58   Temp 97.6 F (36.4 C)   Resp 13   Ht '5\' 6"'$  (1.676 m)   Wt 43.1 kg   SpO2 98%   BMI 15.33 kg/m   Physical Exam  Constitutional: He is oriented to person, place, and time. He appears well-developed.  Cachectic male  HENT:  Head: Normocephalic and atraumatic.  Eyes: Pupils are equal, round, and reactive to light.  Neck: Normal range of  motion.  Cardiovascular: Normal rate and regular rhythm.   Pulmonary/Chest: Effort normal and breath sounds normal.  Abdominal: There is tenderness.  Diffuse upper abdominal pain  Musculoskeletal: Normal range of motion.  Neurological: He is alert and oriented to person, place, and time.  Skin: Skin is warm. Capillary refill takes less than 2 seconds.  Psychiatric: He has a normal mood and affect.  Nursing note and vitals reviewed.    ED Treatments / Results  Labs (all labs ordered are listed, but only abnormal results are displayed) Labs Reviewed  COMPREHENSIVE METABOLIC PANEL - Abnormal; Notable for the following:       Result Value   Glucose, Bld 112 (*)    ALT 13 (*)    Alkaline Phosphatase 143 (*)    All other components within normal limits  CBC WITH DIFFERENTIAL/PLATELET - Abnormal; Notable for the following:    WBC 12.0 (*)    RBC 3.83 (*)    Hemoglobin 12.2 (*)    HCT 36.3 (*)    Neutro Abs 9.4 (*)    All other components within normal limits  URINALYSIS, ROUTINE W REFLEX MICROSCOPIC - Abnormal; Notable for the following:    Color, Urine AMBER (*)    Bilirubin Urine SMALL (*)    Protein, ur 100 (*)    All other components within normal limits  LIPASE, BLOOD  OCCULT BLOOD X 1 CARD TO LAB, STOOL  OCCULT BLOOD GASTRIC / DUODENUM (SPECIMEN CUP)  I-STAT TROPOININ, ED  I-STAT CG4 LACTIC ACID, ED  POC OCCULT BLOOD, ED    EKG  EKG Interpretation None       Radiology Dg Chest 2 View  Result Date: 05/09/2016 CLINICAL DATA:  Chest pain and weight loss EXAM: CHEST  2 VIEW COMPARISON:  Chest radiograph April 06, 2016 and chest CT April 28, 2016 FINDINGS: There is stable mild blunting of the left costophrenic angle. There is no edema or consolidation. Nodular opacity adjacent to the aortic arch on the left is appreciable by radiography but better seen by CT. Heart size is normal. The pulmonary vascularity is normal. There is left hilar adenopathy, stable. No new  adenopathy evident. No bone lesions. Coils are noted in the upper abdomen on the left. There are multiple calcified splenic granulomas. There is aortic atherosclerosis. IMPRESSION: Persistent left hilar adenopathy. Stable nodular opacity adjacent to the aortic arch  on the left. No edema or consolidation. Mild scarring lateral left base. Aortic atherosclerosis. Calcified splenic granulomas present. Electronically Signed   By: Lowella Grip III M.D.   On: 05/09/2016 11:26   Ct Abdomen Pelvis W Contrast  Result Date: 05/09/2016 CLINICAL DATA:  Hemoptysis and abdominal pain over the last 2 days. Recent weight loss. EXAM: CT ABDOMEN AND PELVIS WITH CONTRAST TECHNIQUE: Multidetector CT imaging of the abdomen and pelvis was performed using the standard protocol following bolus administration of intravenous contrast. CONTRAST:  46m ISOVUE-300 IOPAMIDOL (ISOVUE-300) INJECTION 61% COMPARISON:  11/29/2014. FINDINGS: Lower chest: Lung bases are clear.  No pleural or pericardial fluid. Hepatobiliary: Multiple scattered hepatic granulomas as seen previously. No acute liver finding. No calcified gallstones. Pancreas: Chronic pancreatic calcifications. There appears to be pronounced active inflammation in the lesser sac with a 6-7 cm phlegmonous inflammatory mass with a central low density measuring about 2 cm that could represent actual abscess. This indents the posterior wall of the stomach. Spleen: Multiple calcified granulomas.  Otherwise unremarkable. Adrenals/Urinary Tract: Adrenal glands are normal. Kidneys are normal. Stomach/Bowel: Gastrostomy tube in the stomach. No evidence of bowel obstruction or primary bowel pathology. Vascular/Lymphatic: Aortic atherosclerosis.  No aneurysm. Reproductive: Negative Other: No free fluid or air. Musculoskeletal: Negative IMPRESSION: Acute inflammation in the lesser sac region, likely secondary to chronic pancreatitis. Phlegmonous inflammatory mass measuring approximately 7 cm  in diameter. Central nonenhancing region measuring about 2 cm in diameter that could represent a central abscess. Electronically Signed   By: MNelson ChimesM.D.   On: 05/09/2016 15:36    Procedures Procedures (including critical care time)  Medications Ordered in ED Medications  sodium chloride 0.9 % bolus 1,000 mL (0 mLs Intravenous Stopped 05/09/16 1411)    And  0.9 %  sodium chloride infusion ( Intravenous New Bag/Given 05/09/16 1411)  fentaNYL (SUBLIMAZE) injection 50 mcg (50 mcg Intravenous Given 05/09/16 1217)  ondansetron (ZOFRAN) injection 4 mg (4 mg Intravenous Given 05/09/16 1217)  morphine 4 MG/ML injection 2 mg (2 mg Intravenous Given 05/09/16 1411)  iopamidol (ISOVUE-300) 61 % injection (75 mLs  Contrast Given 05/09/16 1443)     Initial Impression / Assessment and Plan / ED Course  I have reviewed the triage vital signs and the nursing notes.  Pertinent labs & imaging results that were available during my care of the patient were reviewed by me and considered in my medical decision making (see chart for details).    56y.o. Male history of chronic pancreatitis presents today with worsening abdominal pain. CT is significant for phlegmonous mass approximately 7 cm. Discussed with KDyanne Carrel NP on for hosptalist and they will admit Dicussed with Dr. HBenson Norwayon for GI- will consult  Final Clinical Impressions(s) / ED Diagnoses   Final diagnoses:  Pancreatic lesion  Pain of upper abdomen    New Prescriptions New Prescriptions   No medications on file     DPattricia Boss MD 05/09/16 1631

## 2016-05-09 NOTE — H&P (Signed)
History and Physical    SERIGNE KUBICEK YSA:[id number] DOB: 19-Jun-1960 DOA: 05/09/2016  PCP: [name], FNP Patient coming from: home  Chief Complaint: abdominal pain/persistent nausea and vomiting  HPI: TREY GULBRANSON is an unfortunate 56 y.o. male with medical history significant seizure disorder chronic pain chronic pancreatitis related to ETOH, tobacco use since emergency Department chief complaint of worsening abdominal pain and persistent nausea and vomiting. Initial evaluation reveals acute on chronic pancreatitis and CT significant for phlegmonous mass.  Information is obtained from the patient and the chart as well as the fianc who is at the bedside. Patient reports chronic abdominal pain for "a long time. Associated with unintentional weight loss persistent nausea and intermittent vomiting. He reports he had a PEG tube inserted after recent acute pancreatitis episode. He has been able to "hold down" water or tube feedings. Moving makes pain worse. He describes the pain as a constant ache rated 10 out of 10 located in his abdominal area mostly. He denies any alcohol for over one year. He says he has a colonoscopy scheduled for May 11 in [place] Chart review indicates patient was seen in March for weight loss to nerve pain. At that time he had a chest x-ray as part of his workup which revealed a left pleural effusion and hazy density over the aortic arch. No chest CT in April which showed nodule along the left lateral margin of the aortic with lymphadenopathy concerning for primary lung malignancy. He does have an appointment with oncology May 9. He denies having any   ED Course: Emergency department he is afebrile hemodynamically stable and not hypoxic  Review of Systems: As per HPI otherwise 10 point review of systems negative.   Ambulatory Status: Has been bedbound for the last several weeks as he is "too weak to walk"  Past Medical History:  Diagnosis Date  . Anxiety states  08/04/2010  . Chronic airway obstruction, not elsewhere classified 08/04/2010  . Chronic pain syndrome 08/04/2010  . Chronic pancreatitis ([place]) 08/04/2010  . Coronary artery disease   . GERD (gastroesophageal reflux disease)   . Hiatal hernia    EGD 01/18/13  . Insomnia 08/04/2010  . Other abnormal glucose 08/04/2010  . Pancreatitis chronic 08/04/2010  . Seizure ([place]) 08/04/2010  . Splenic vein thrombosis    Chronic.  . Tobacco use disorder 08/04/2010  . Unspecified essential hypertension 08/04/2010    Past Surgical History:  Procedure Laterality Date  . CORONARY ANGIOPLASTY WITH STENT PLACEMENT    . ESOPHAGOGASTRODUODENOSCOPY N/A 01/18/2013   Procedure: ESOPHAGOGASTRODUODENOSCOPY (EGD);  Surgeon: Rogene Houston, MD;  [facility];  Service: Endoscopy;  Laterality: N/A;  . EUS  03/26/2009   [facility]  . GASTROSTOMY-JEJEUNOSTOMY TUBE CHANGE/PLACEMENT  12/11/2008   [facility]  . I&D EXTREMITY Left 09/12/2012   Procedure: IRRIGATION AND DEBRIDEMENT LEFT FIFTH FINGER;  Surgeon: Roseanne Kaufman, MD;  Location: [facility];  Service: Orthopedics;  Laterality: Left;  . JEJUNOSTOMY FEEDING TUBE  07/13/2010  . NERVE AND TENDON REPAIR Left 09/12/2012   Procedure: NERVE AND TENDON REPAIR LEFT FIFTH FINGER;  Surgeon: Roseanne Kaufman, MD;  Location: [place];  Service: Orthopedics;  Laterality: Left;  . OPEN REDUCTION INTERNAL FIXATION (ORIF) METACARPAL Left 09/12/2012   Procedure: OPEN REDUCTION INTERNAL FIXATION LEFT FIFTH FINGER;  Surgeon: Roseanne Kaufman, MD;  Location: [place];  Service: Orthopedics;  Laterality: Left;  . PANCREATIC PSEUDOCYST DRAINAGE  07/13/2010  . PORTACATH PLACEMENT  11/28/2008   [place] Hose  Social History   Social History  . Marital status: Married    Spouse name: N/A  . Number of children: N/A  . Years of education: N/A   Occupational History  . Not on file.   Social History Main Topics  . Smoking status: Current Every Day Smoker    Packs/day: 1.00     Years: 20.00    Types: Cigarettes  . Smokeless tobacco: Never Used     Comment: 1 pack a day since age 59  . Alcohol use No     Comment: No etoh since 5-6 yrs  . Drug use: No  . Sexual activity: No   Other Topics Concern  . Not on file   Social History Narrative  . No narrative on file    Allergies  Allergen Reactions  . Latex Rash    No family history on file.  Prior to Admission medications   Medication Sig Start Date End Date Taking? Authorizing Provider  albuterol (PROVENTIL HFA;VENTOLIN HFA) 108 (90 BASE) MCG/ACT inhaler Inhale 2 puffs into the lungs every 6 (six) hours as needed for wheezing or shortness of breath.    Yes Historical Provider, MD  amLODipine (NORVASC) 10 MG tablet TAKE ONE (1) TABLET BY MOUTH EVERY DAY 04/15/16  Yes [name], FNP  CREON [phone] units CPEP TAKE TWO CAPSULES BY MOUTH BEFORE MEALS 04/15/16  Yes [name], FNP  DULoxetine (CYMBALTA) 30 MG capsule Take 1 capsule (30 mg total) by mouth 2 (two) times daily. 04/06/16  Yes [name], FNP  ferrous sulfate 325 (65 FE) MG tablet TAKE ONE (1) TABLET BY MOUTH EVERY DAY WITH BREAKFAST Patient taking differently: Take 325 mg by mouth daily with breakfast. TAKE ONE (1) TABLET BY MOUTH EVERY DAY WITH BREAKFAST 02/12/16  Yes Micheline Chapman, NP  gabapentin (NEURONTIN) 300 MG capsule Take 1 capsule (300 mg total) by mouth 4 (four) times daily as needed. Patient taking differently: Take 600 mg by mouth 3 (three) times daily.  01/28/16  Yes [name], FNP  hydrocortisone cream 0.5 % Apply 1 application topically 2 (two) times daily. 02/28/16  Yes [name], FNP  mirtazapine (REMERON) 15 MG tablet Take 15 mg by mouth at bedtime.   Yes Historical Provider, MD  Oxycodone HCl 10 MG TABS Take 1 tablet (10 mg total) by mouth every 6 (six) hours as needed. 04/30/16  Yes [name], FNP  pantoprazole (PROTONIX) 40 MG tablet TAKE ONE (1) TABLET BY MOUTH EVERY DAY 04/15/16  Yes [name], FNP  phenytoin (DILANTIN) 100 MG ER capsule TAKE THREE CAPSULES BY MOUTH DAILY 04/15/16  Yes [name], FNP  potassium chloride SA (K-DUR,KLOR-CON) 20 MEQ tablet TAKE TWO (2) TABLETS BY MOUTH DAILY 03/18/16  Yes Micheline Chapman, NP  promethazine (PHENERGAN) 25 MG tablet Take 25 mg by mouth every 6 (six) hours as needed for nausea or vomiting.    Yes Historical Provider, MD  ranitidine (ZANTAC) 150 MG capsule TAKE 1 CAPSULE BY MOUTH TWICE DAILY Patient taking differently: TAKE 1 CAPSULE BY MOUTH AT BEDTIME 06/13/15  Yes Micheline Chapman, NP  traZODone (DESYREL) 100 MG tablet Take 2 tablets (200 mg total) by mouth at bedtime. 02/12/16  Yes Micheline Chapman, NP  vitamin C (ASCORBIC ACID) 500 MG tablet Take 1 tablet (500 mg total) by mouth daily. 11/20/15  Yes Micheline Chapman, NP  Adhesive Tape (CLOTH ADHESIVE SURG 1/2"X10YD) TAPE 1 each by Does  not apply route 2 (two) times daily as needed. 03/09/16   [name], FNP  Gauze Pads & Dressings (BIOGUARD GAUZE SPONGES) 4"X4" PADS 1 each by Does not apply route 2 (two) times daily. 03/09/16   [name], FNP    Physical Exam: Vitals:   05/09/16 1521 05/09/16 1545 05/09/16 1549 05/09/16 1630  BP:  121/64  122/67  Pulse: (!) 58 (!) 58  (!) 59  Resp:   13 13  Temp:      SpO2: 98% 98%  97%  Weight:      Height:         General:  Appears Quite chronically ill, cachectic somewhat lethargic but not uncomfortable Eyes:  PERRL, EOMI, normal lids, iris ENT:  grossly normal hearing, lips & tongue, mucous membranes of his mouth are slightly pale somewhat dry Neck:  no LAD, masses or thyromegaly Cardiovascular:  Heart rate the lower end of normal but regular no m/r/g. No LE edema.  Respiratory:  CTA bilaterally, no w/r/r. Normal respiratory effort. Abdomen:  soft, PEG tube in place sluggish bowel sounds diffuse tenderness Skin:  no rash or induration seen on limited exam Musculoskeletal:  grossly normal tone BUE/BLE, good ROM, no  bony abnormality Psychiatric:  grossly normal mood and affect, speech fluent and appropriate, AOx3 Neurologic:  CN 2-12 grossly intact, moves all extremities in coordinated fashion, sensation intact  Labs on Admission: I have personally reviewed following labs and imaging studies  CBC:  Recent Labs Lab 05/09/16 1152  WBC 12.0*  NEUTROABS 9.4*  HGB 12.2*  HCT 36.3*  MCV 94.8  PLT 938   Basic Metabolic Panel:  Recent Labs Lab 05/09/16 1152  NA 138  K 3.9  CL 101  CO2 26  GLUCOSE 112*  BUN 17  CREATININE 0.79  CALCIUM 8.9   GFR: Estimated Creatinine Clearance: 63.6 mL/min (by C-G formula based on SCr of 0.79 mg/dL). Liver Function Tests:  Recent Labs Lab 05/09/16 1152  AST 24  ALT 13*  ALKPHOS 143*  BILITOT 0.7  PROT 7.5  ALBUMIN 3.6    Recent Labs Lab 05/09/16 1152  LIPASE 63*   No results for input(s): AMMONIA in the last 168 hours. Coagulation Profile: No results for input(s): INR, PROTIME in the last 168 hours. Cardiac Enzymes: No results for input(s): CKTOTAL, CKMB, CKMBINDEX, TROPONINI in the last 168 hours. BNP (last 3 results) No results for input(s): PROBNP in the last 8760 hours. HbA1C: No results for input(s): HGBA1C in the last 72 hours. CBG: No results for input(s): GLUCAP in the last 168 hours. Lipid Profile: No results for input(s): CHOL, HDL, LDLCALC, TRIG, CHOLHDL, LDLDIRECT in the last 72 hours. Thyroid Function Tests: No results for input(s): TSH, T4TOTAL, FREET4, T3FREE, THYROIDAB in the last 72 hours. Anemia Panel: No results for input(s): VITAMINB12, FOLATE, FERRITIN, TIBC, IRON, RETICCTPCT in the last 72 hours. Urine analysis:    Component Value Date/Time   COLORURINE AMBER (A) 05/09/2016 1258   APPEARANCEUR CLEAR 05/09/2016 1258   LABSPEC 1.024 05/09/2016 1258   PHURINE 7.0 05/09/2016 1258   GLUCOSEU NEGATIVE 05/09/2016 1258   HGBUR NEGATIVE 05/09/2016 1258   BILIRUBINUR SMALL (A) 05/09/2016 1258   KETONESUR NEGATIVE  05/09/2016 1258   PROTEINUR 100 (A) 05/09/2016 1258   UROBILINOGEN 2.0 (H) 04/06/2016 1526   NITRITE NEGATIVE 05/09/2016 1258   LEUKOCYTESUR NEGATIVE 05/09/2016 1258    Creatinine Clearance: Estimated Creatinine Clearance: 63.6 mL/min (by C-G formula based on SCr of 0.79 mg/dL).  Sepsis Labs: '@LABRCNTIP'$ (procalcitonin:4,lacticidven:4) )No results found for this or any previous visit (from the past 240 hour(s)).   Radiological Exams on Admission: Dg Chest 2 View  Result Date: 05/09/2016 CLINICAL DATA:  Chest pain and weight loss EXAM: CHEST  2 VIEW COMPARISON:  Chest radiograph April 06, 2016 and chest CT April 28, 2016 FINDINGS: There is stable mild blunting of the left costophrenic angle. There is no edema or consolidation. Nodular opacity adjacent to the aortic arch on the left is appreciable by radiography but better seen by CT. Heart size is normal. The pulmonary vascularity is normal. There is left hilar adenopathy, stable. No new adenopathy evident. No bone lesions. Coils are noted in the upper abdomen on the left. There are multiple calcified splenic granulomas. There is aortic atherosclerosis. IMPRESSION: Persistent left hilar adenopathy. Stable nodular opacity adjacent to the aortic arch on the left. No edema or consolidation. Mild scarring lateral left base. Aortic atherosclerosis. Calcified splenic granulomas present. Electronically Signed   By: Lowella Grip III M.D.   On: 05/09/2016 11:26   Ct Abdomen Pelvis W Contrast  Result Date: 05/09/2016 CLINICAL DATA:  Hemoptysis and abdominal pain over the last 2 days. Recent weight loss. EXAM: CT ABDOMEN AND PELVIS WITH CONTRAST TECHNIQUE: Multidetector CT imaging of the abdomen and pelvis was performed using the standard protocol following bolus administration of intravenous contrast. CONTRAST:  97m ISOVUE-300 IOPAMIDOL (ISOVUE-300) INJECTION 61% COMPARISON:  11/29/2014. FINDINGS: Lower chest: Lung bases are clear.  No pleural or  pericardial fluid. Hepatobiliary: Multiple scattered hepatic granulomas as seen previously. No acute liver finding. No calcified gallstones. Pancreas: Chronic pancreatic calcifications. There appears to be pronounced active inflammation in the lesser sac with a 6-7 cm phlegmonous inflammatory mass with a central low density measuring about 2 cm that could represent actual abscess. This indents the posterior wall of the stomach. Spleen: Multiple calcified granulomas.  Otherwise unremarkable. Adrenals/Urinary Tract: Adrenal glands are normal. Kidneys are normal. Stomach/Bowel: Gastrostomy tube in the stomach. No evidence of bowel obstruction or primary bowel pathology. Vascular/Lymphatic: Aortic atherosclerosis.  No aneurysm. Reproductive: Negative Other: No free fluid or air. Musculoskeletal: Negative IMPRESSION: Acute inflammation in the lesser sac region, likely secondary to chronic pancreatitis. Phlegmonous inflammatory mass measuring approximately 7 cm in diameter. Central nonenhancing region measuring about 2 cm in diameter that could represent a central abscess. Electronically Signed   By: MNelson ChimesM.D.   On: 05/09/2016 15:36    EKG: Independently reviewed. Normal sinus rhythm Right atrial enlargement   Assessment/Plan Principal Problem:   Acute on chronic pancreatitis (HCC) Active Problems:   Hypertension   GERD (gastroesophageal reflux disease)   Hematemesis   Seizure disorder (HCC)   Protein-calorie malnutrition, severe (HCC)   Chronic pain syndrome   Tobacco use disorder   Coronary artery disease   Atherosclerosis of aorta (HCC)   Phlegmon of pancreas   Lymphadenopathy    #1. Acute on chronic pancreatitis. History of EtOH abuse. No alcohol in a year. Lipase pending at time of admission. Mild leukocytosis -Admit to telemetry -Vigorous IV fluids -supportive therapy -bowel rest -monitor closely  #2. Hematemesis. Emesis guac positive. Hx of same. Hg stable. No signs active  bleeding -await GI consult -monitor hem -antiemetic  #3.phlegmon of pancreas. Per CT. She is afebrile nontoxic appearing mild leukocytosis -IV fluids -Supportive therapy -GI consult  #4. Hypertension. Fair control in the emergency department Home meds include amlodipine -Monitor closely  #5. Seizure disorder. History of same. No recent seizure activity -dilantin  IV to be dosed by pharmacy  #6. Lung malignancy. Per CT done 4/17. Has follow up appointment with oncology 05/21/14  #7. Chronic pain. Patient says abdominal pain unbearable. See #1. He was recently seen by his primary care provider. Note indicates no inconsistancies with pain meds -continue home meds -IV dilaudid for breakthrough    DVT prophylaxis: scd  Code Status: full Family Communication: significant other at bedside  Disposition Plan: may benefit placement  Consults called: gi  Admission status: obs    Radene Gunning MD [facility]  If 7PM-7AM, please contact night-coverage [email or web address] Password [facility]  05/09/2016, 4:52 PM

## 2016-05-10 DIAGNOSIS — I700 Atherosclerosis of aorta: Secondary | ICD-10-CM | POA: Diagnosis present

## 2016-05-10 DIAGNOSIS — K860 Alcohol-induced chronic pancreatitis: Secondary | ICD-10-CM | POA: Diagnosis present

## 2016-05-10 DIAGNOSIS — F70 Mild intellectual disabilities: Secondary | ICD-10-CM | POA: Diagnosis present

## 2016-05-10 DIAGNOSIS — K859 Acute pancreatitis without necrosis or infection, unspecified: Secondary | ICD-10-CM | POA: Diagnosis present

## 2016-05-10 DIAGNOSIS — K869 Disease of pancreas, unspecified: Secondary | ICD-10-CM | POA: Diagnosis present

## 2016-05-10 DIAGNOSIS — Z681 Body mass index (BMI) 19 or less, adult: Secondary | ICD-10-CM | POA: Diagnosis not present

## 2016-05-10 DIAGNOSIS — R739 Hyperglycemia, unspecified: Secondary | ICD-10-CM | POA: Diagnosis present

## 2016-05-10 DIAGNOSIS — R590 Localized enlarged lymph nodes: Secondary | ICD-10-CM | POA: Diagnosis present

## 2016-05-10 DIAGNOSIS — K219 Gastro-esophageal reflux disease without esophagitis: Secondary | ICD-10-CM | POA: Diagnosis not present

## 2016-05-10 DIAGNOSIS — J449 Chronic obstructive pulmonary disease, unspecified: Secondary | ICD-10-CM | POA: Diagnosis present

## 2016-05-10 DIAGNOSIS — I82891 Chronic embolism and thrombosis of other specified veins: Secondary | ICD-10-CM | POA: Diagnosis present

## 2016-05-10 DIAGNOSIS — F17210 Nicotine dependence, cigarettes, uncomplicated: Secondary | ICD-10-CM | POA: Diagnosis present

## 2016-05-10 DIAGNOSIS — F319 Bipolar disorder, unspecified: Secondary | ICD-10-CM | POA: Diagnosis present

## 2016-05-10 DIAGNOSIS — K210 Gastro-esophageal reflux disease with esophagitis: Secondary | ICD-10-CM | POA: Diagnosis present

## 2016-05-10 DIAGNOSIS — I2510 Atherosclerotic heart disease of native coronary artery without angina pectoris: Secondary | ICD-10-CM | POA: Diagnosis present

## 2016-05-10 DIAGNOSIS — I10 Essential (primary) hypertension: Secondary | ICD-10-CM | POA: Diagnosis present

## 2016-05-10 DIAGNOSIS — F1010 Alcohol abuse, uncomplicated: Secondary | ICD-10-CM | POA: Diagnosis present

## 2016-05-10 DIAGNOSIS — E43 Unspecified severe protein-calorie malnutrition: Secondary | ICD-10-CM | POA: Diagnosis not present

## 2016-05-10 DIAGNOSIS — R64 Cachexia: Secondary | ICD-10-CM | POA: Diagnosis present

## 2016-05-10 DIAGNOSIS — G40909 Epilepsy, unspecified, not intractable, without status epilepticus: Secondary | ICD-10-CM | POA: Diagnosis present

## 2016-05-10 DIAGNOSIS — G629 Polyneuropathy, unspecified: Secondary | ICD-10-CM | POA: Diagnosis present

## 2016-05-10 DIAGNOSIS — F419 Anxiety disorder, unspecified: Secondary | ICD-10-CM | POA: Diagnosis present

## 2016-05-10 DIAGNOSIS — K920 Hematemesis: Secondary | ICD-10-CM | POA: Diagnosis present

## 2016-05-10 DIAGNOSIS — G894 Chronic pain syndrome: Secondary | ICD-10-CM | POA: Diagnosis present

## 2016-05-10 DIAGNOSIS — K861 Other chronic pancreatitis: Secondary | ICD-10-CM | POA: Diagnosis not present

## 2016-05-10 DIAGNOSIS — D649 Anemia, unspecified: Secondary | ICD-10-CM | POA: Diagnosis present

## 2016-05-10 DIAGNOSIS — F17200 Nicotine dependence, unspecified, uncomplicated: Secondary | ICD-10-CM | POA: Diagnosis not present

## 2016-05-10 DIAGNOSIS — K449 Diaphragmatic hernia without obstruction or gangrene: Secondary | ICD-10-CM | POA: Diagnosis present

## 2016-05-10 LAB — COMPREHENSIVE METABOLIC PANEL
ALK PHOS: 104 U/L (ref 38–126)
ALT: 11 U/L — ABNORMAL LOW (ref 17–63)
ANION GAP: 7 (ref 5–15)
AST: 14 U/L — ABNORMAL LOW (ref 15–41)
Albumin: 2.6 g/dL — ABNORMAL LOW (ref 3.5–5.0)
BILIRUBIN TOTAL: 0.6 mg/dL (ref 0.3–1.2)
BUN: 7 mg/dL (ref 6–20)
CO2: 26 mmol/L (ref 22–32)
CREATININE: 0.63 mg/dL (ref 0.61–1.24)
Calcium: 8.2 mg/dL — ABNORMAL LOW (ref 8.9–10.3)
Chloride: 107 mmol/L (ref 101–111)
GFR calc Af Amer: >60 mL/min (ref >60)
Glucose, Bld: 71 mg/dL (ref 65–99)
Potassium: 3.5 mmol/L (ref 3.5–5.1)
SODIUM: 140 mmol/L (ref 135–145)
Total Protein: 5.6 g/dL — ABNORMAL LOW (ref 6.5–8.1)

## 2016-05-10 LAB — CBC
HCT: 27.2 % — ABNORMAL LOW (ref 39.0–52.0)
Hemoglobin: 9 g/dL — ABNORMAL LOW (ref 13.0–17.0)
MCH: 31.7 pg (ref 26.0–34.0)
MCHC: 33.1 g/dL (ref 30.0–36.0)
MCV: 95.8 fL (ref 78.0–100.0)
PLATELETS: 143 10*3/uL — AB (ref 150–400)
RBC: 2.84 MIL/uL — ABNORMAL LOW (ref 4.22–5.81)
RDW: 14.5 % (ref 11.5–15.5)
WBC: 8.2 10*3/uL (ref 4.0–10.5)

## 2016-05-10 LAB — PROTIME-INR
INR: 1.12
Prothrombin Time: 14.4 seconds (ref 11.4–15.2)

## 2016-05-10 LAB — LIPASE, BLOOD: Lipase: 43 U/L (ref 11–51)

## 2016-05-10 LAB — PHENYTOIN LEVEL, TOTAL: Phenytoin Lvl: 3 ug/mL — ABNORMAL LOW (ref 10.0–20.0)

## 2016-05-10 LAB — PHOSPHORUS: Phosphorus: 2.8 mg/dL (ref 2.5–4.6)

## 2016-05-10 LAB — GLUCOSE, CAPILLARY: GLUCOSE-CAPILLARY: 90 mg/dL (ref 65–99)

## 2016-05-10 LAB — MAGNESIUM: Magnesium: 1.7 mg/dL (ref 1.7–2.4)

## 2016-05-10 LAB — APTT: APTT: 34 s (ref 24–36)

## 2016-05-10 MED ORDER — SODIUM CHLORIDE 0.9% FLUSH
10.0000 mL | INTRAVENOUS | Status: DC | PRN
  Administered 2016-05-12 – 2016-05-13 (×3): 10 mL
  Filled 2016-05-10 (×3): qty 40

## 2016-05-10 MED ORDER — LORAZEPAM 2 MG/ML IJ SOLN
1.0000 mg | Freq: Four times a day (QID) | INTRAMUSCULAR | Status: DC
  Administered 2016-05-10 – 2016-05-11 (×4): 1 mg via INTRAVENOUS
  Filled 2016-05-10 (×4): qty 1

## 2016-05-10 MED ORDER — FAT EMULSION 20 % IV EMUL
60.0000 mL | INTRAVENOUS | Status: AC
  Administered 2016-05-10: 60 mL via INTRAVENOUS
  Filled 2016-05-10: qty 250

## 2016-05-10 MED ORDER — CHLORHEXIDINE GLUCONATE CLOTH 2 % EX PADS
6.0000 | MEDICATED_PAD | Freq: Once | CUTANEOUS | Status: AC
  Administered 2016-05-11: 6 via TOPICAL

## 2016-05-10 MED ORDER — THIAMINE HCL 100 MG/ML IJ SOLN
INTRAVENOUS | Status: AC
  Administered 2016-05-10: 17:00:00 via INTRAVENOUS
  Filled 2016-05-10: qty 480

## 2016-05-10 MED ORDER — SODIUM CHLORIDE 0.9 % IV SOLN
INTRAVENOUS | Status: AC
  Administered 2016-05-10 – 2016-05-12 (×4): via INTRAVENOUS

## 2016-05-10 MED ORDER — PHENYTOIN SODIUM 50 MG/ML IJ SOLN
100.0000 mg | Freq: Three times a day (TID) | INTRAMUSCULAR | Status: DC
  Administered 2016-05-10 – 2016-05-15 (×13): 100 mg via INTRAVENOUS
  Filled 2016-05-10 (×16): qty 2

## 2016-05-10 MED ORDER — SODIUM CHLORIDE 0.9 % IV SOLN
30.0000 meq | INTRAVENOUS | Status: DC
  Filled 2016-05-10 (×2): qty 15

## 2016-05-10 MED ORDER — INSULIN ASPART 100 UNIT/ML ~~LOC~~ SOLN
0.0000 [IU] | SUBCUTANEOUS | Status: DC
  Administered 2016-05-12: 1 [IU] via SUBCUTANEOUS
  Administered 2016-05-12: 2 [IU] via SUBCUTANEOUS
  Administered 2016-05-12 – 2016-05-13 (×3): 1 [IU] via SUBCUTANEOUS
  Administered 2016-05-13: 2 [IU] via SUBCUTANEOUS
  Administered 2016-05-14: 0.5 [IU] via SUBCUTANEOUS
  Administered 2016-05-14: 1 [IU] via SUBCUTANEOUS

## 2016-05-10 MED ORDER — CHLORHEXIDINE GLUCONATE CLOTH 2 % EX PADS
6.0000 | MEDICATED_PAD | Freq: Once | CUTANEOUS | Status: AC
  Administered 2016-05-10: 6 via TOPICAL

## 2016-05-10 MED ORDER — POTASSIUM PHOSPHATES 15 MMOLE/5ML IV SOLN
10.0000 mmol | Freq: Once | INTRAVENOUS | Status: AC
  Administered 2016-05-10: 10 mmol via INTRAVENOUS
  Filled 2016-05-10: qty 3.33

## 2016-05-10 MED ORDER — SODIUM CHLORIDE 0.9 % IV SOLN
30.0000 meq | INTRAVENOUS | Status: AC
  Administered 2016-05-10: 30 meq via INTRAVENOUS
  Filled 2016-05-10: qty 15

## 2016-05-10 MED ORDER — MAGNESIUM SULFATE 2 GM/50ML IV SOLN
2.0000 g | Freq: Once | INTRAVENOUS | Status: AC
  Administered 2016-05-10: 2 g via INTRAVENOUS
  Filled 2016-05-10: qty 50

## 2016-05-10 NOTE — Progress Notes (Signed)
Patient ID: Marcus Brooks, male   DOB: 01-Jan-1961, 56 y.o.   MRN: [id number] Surgery requested evaluation for drainage of the small intra-abdominal fluid collections.  CT from 05/09/16 demonstrates a 2 cm low density collection surrounded by edematous tissue.  I suspect this edematous tissue is at least partially made up of gastric wall.  There is a second small collection anterior to the pancreatic tail that measures roughly 3 cm.  Neither of the collections are ideal for percutaneous aspiration or drainage without traversing the stomach.  If sampling of these collections is needed, consider sampling with EUS.  Discussed with Dr. Kieth Brightly.
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
## 2016-05-10 NOTE — Progress Notes (Signed)
Pharmacy - Phenytoin dosing  Hx seizures on phenytoin ER 300 mg PO qhs. Spoke with patient who admits to "missing a couple doses" due to N/V. His dose for current weight is high at 7 mg/kg/day.  Total level this morning is low at 3, albumin 2.6, corrects to 4.84 (goal 10-20 mcg/ml). No seizures reported. Has a total level back in March that was therapeutic on PTA dose.  Would recommend no dose increase at this point. Instead suggest checking a steady-state level in 5-7 days.  Renold Genta, PharmD, BCPS Clinical Pharmacist Phone for today - [place] - [phone] 05/10/2016 10:37 AM

## 2016-05-10 NOTE — Progress Notes (Signed)
Peripherally Inserted Central Catheter/Midline Placement  The IV Nurse has discussed with the patient and/or persons authorized to consent for the patient, the purpose of this procedure and the potential benefits and risks involved with this procedure.  The benefits include less needle sticks, lab draws from the catheter, and the patient may be discharged home with the catheter. Risks include, but not limited to, infection, bleeding, blood clot (thrombus formation), and puncture of an artery; nerve damage and irregular heartbeat and possibility to perform a PICC exchange if needed/ordered by physician.  Alternatives to this procedure were also discussed.  B[employer] Power PICC patient education guide, fact sheet on infection prevention and patient information card has been provided to patient /or left at bedside.    PICC/Midline Placement Documentation  PICC Double Lumen 05/10/16 PICC Right Brachial 38 cm 0 cm (Active)  Indication for Insertion or Continuance of Line Administration of hyperosmolar/irritating solutions (i.e. TPN, Vancomycin, etc.) 05/10/2016 12:17 PM  Exposed Catheter (cm) 0 cm 05/10/2016 12:17 PM  Site Assessment Clean;Dry;Intact 05/10/2016 12:17 PM  Lumen #1 Status Flushed;Blood return noted;Saline locked 05/10/2016 12:17 PM  Lumen #2 Status Flushed;Saline locked;Blood return noted 05/10/2016 12:17 PM  Dressing Type Transparent;Securing device 05/10/2016 12:17 PM  Dressing Status Clean;Intact;Antimicrobial disc in place;Dry 05/10/2016 12:17 PM  Line Care Connections checked and tightened 05/10/2016 12:17 PM  Line Adjustment (NICU/IV Team Only) No 05/10/2016 12:17 PM  Dressing Intervention New dressing 05/10/2016 12:17 PM  Dressing Change Due 05/17/16 05/10/2016 12:17 PM       Marsi Turvey, Weldon Inches 05/10/2016, 12:20 PM

## 2016-05-10 NOTE — Consult Note (Signed)
Reason for Consult:pancreatitis with walled fluid collection Referring Physician: Lesly, Marcus Brooks is an 56 y.o. male.  HPI: 56 yo male with history of chronic pancreatitis 2/2 ETOH presents with 1 month of malaise, 50lb weight loss, abdominal pain. He has not been able to tolerate PO intake much in the last month and it has worsened in the past week. His abdominal pain is constant. He was vomiting yesterday. He is very concerned about his weight loss  Past Medical History:  Diagnosis Date  . Anxiety states 08/04/2010  . Chronic airway obstruction, not elsewhere classified 08/04/2010  . Chronic pain syndrome 08/04/2010  . Chronic pancreatitis ([place]) 08/04/2010  . Coronary artery disease   . GERD (gastroesophageal reflux disease)   . Hiatal hernia    EGD 01/18/13  . Insomnia 08/04/2010  . Other abnormal glucose 08/04/2010  . Pancreatitis chronic 08/04/2010  . Seizure ([place]) 08/04/2010  . Splenic vein thrombosis    Chronic.  . Tobacco use disorder 08/04/2010  . Unspecified essential hypertension 08/04/2010    Past Surgical History:  Procedure Laterality Date  . CORONARY ANGIOPLASTY WITH STENT PLACEMENT    . ESOPHAGOGASTRODUODENOSCOPY N/A 01/18/2013   Procedure: ESOPHAGOGASTRODUODENOSCOPY (EGD);  Surgeon: Rogene Houston, MD;  [facility];  Service: Endoscopy;  Laterality: N/A;  . EUS  03/26/2009   [facility]  . GASTROSTOMY-JEJEUNOSTOMY TUBE CHANGE/PLACEMENT  12/11/2008   [facility]  . I&D EXTREMITY Left 09/12/2012   Procedure: IRRIGATION AND DEBRIDEMENT LEFT FIFTH FINGER;  Surgeon: Roseanne Kaufman, MD;  Location: [facility] [facility];  Service: Orthopedics;  Laterality: Left;  . JEJUNOSTOMY FEEDING TUBE  07/13/2010  . NERVE AND TENDON REPAIR Left 09/12/2012   Procedure: NERVE AND TENDON REPAIR LEFT FIFTH FINGER;  Surgeon: Roseanne Kaufman, MD;  Location: [facility];  Service: Orthopedics;  Laterality: Left;  . OPEN REDUCTION INTERNAL FIXATION (ORIF) METACARPAL Left 09/12/2012    Procedure: OPEN REDUCTION INTERNAL FIXATION LEFT FIFTH FINGER;  Surgeon: Roseanne Kaufman, MD;  Location: [facility];  Service: Orthopedics;  Laterality: Left;  . PANCREATIC PSEUDOCYST DRAINAGE  07/13/2010  . PORTACATH PLACEMENT  11/28/2008   FLEISHMAN    No family history on file.  Social History:  reports that he has been smoking Cigarettes.  He has a 20.00 pack-year smoking history. He has never used smokeless tobacco. He reports that he does not drink alcohol or use drugs.  Allergies:  Allergies  Allergen Reactions  . Latex Rash    Medications: I have reviewed the patient's current medications.  Results for orders placed or performed during the hospital encounter of 05/09/16 (from the past 48 hour(s))  Lipase, blood     Status: Abnormal   Collection Time: 05/09/16 11:52 AM  Result Value Ref Range   Lipase 63 (H) 11 - 51 U/L  Comprehensive metabolic panel     Status: Abnormal   Collection Time: 05/09/16 11:52 AM  Result Value Ref Range   Sodium 138 135 - 145 mmol/L   Potassium 3.9 3.5 - 5.1 mmol/L   Chloride 101 101 - 111 mmol/L   CO2 26 22 - 32 mmol/L   Glucose, Bld 112 (H) 65 - 99 mg/dL   BUN 17 6 - 20 mg/dL   Creatinine, Ser 0.79 0.61 - 1.24 mg/dL   Calcium 8.9 8.9 - 10.3 mg/dL   Total Protein 7.5 6.5 - 8.1 g/dL   Albumin 3.6 3.5 - 5.0 g/dL   AST 24 15 - 41 U/L   ALT 13 (L) 17 -  63 U/L   Alkaline Phosphatase 143 (H) 38 - 126 U/L   Total Bilirubin 0.7 0.3 - 1.2 mg/dL   GFR calc non Af Amer >60 >60 mL/min   GFR calc Af Amer >60 >60 mL/min    Comment: (NOTE) The eGFR has been calculated using the CKD EPI equation. This calculation has not been validated in all clinical situations. eGFR's persistently <60 mL/min signify possible Chronic Kidney Disease.    Anion gap 11 5 - 15  CBC WITH DIFFERENTIAL     Status: Abnormal   Collection Time: 05/09/16 11:52 AM  Result Value Ref Range   WBC 12.0 (H) 4.0 - 10.5 K/uL   RBC 3.83 (L) 4.22 - 5.81 MIL/uL   Hemoglobin 12.2 (L) 13.0  - 17.0 g/dL   HCT 29.0 (L) 47.5 - 33.9 %   MCV 94.8 78.0 - 100.0 fL   MCH 31.9 26.0 - 34.0 pg   MCHC 33.6 30.0 - 36.0 g/dL   RDW 17.9 21.7 - 83.7 %   Platelets 181 150 - 400 K/uL   Neutrophils Relative % 77 %   Neutro Abs 9.4 (H) 1.7 - 7.7 K/uL   Lymphocytes Relative 14 %   Lymphs Abs 1.6 0.7 - 4.0 K/uL   Monocytes Relative 8 %   Monocytes Absolute 0.9 0.1 - 1.0 K/uL   Eosinophils Relative 1 %   Eosinophils Absolute 0.1 0.0 - 0.7 K/uL   Basophils Relative 0 %   Basophils Absolute 0.0 0.0 - 0.1 K/uL  I-stat troponin, ED     Status: None   Collection Time: 05/09/16 12:09 PM  Result Value Ref Range   Troponin i, poc 0.00 0.00 - 0.08 ng/mL   Comment 3            Comment: Due to the release kinetics of cTnI, a negative result within the first hours of the onset of symptoms does not rule out myocardial infarction with certainty. If myocardial infarction is still suspected, repeat the test at appropriate intervals.   I-Stat CG4 Lactic Acid, ED  (not at [facility])     Status: None   Collection Time: 05/09/16 12:11 PM  Result Value Ref Range   Lactic Acid, Venous 1.86 0.5 - 1.9 mmol/L  Urinalysis, Routine w reflex microscopic     Status: Abnormal   Collection Time: 05/09/16 12:58 PM  Result Value Ref Range   Color, Urine AMBER (A) YELLOW    Comment: BIOCHEMICALS MAY BE AFFECTED BY COLOR   APPearance CLEAR CLEAR   Specific Gravity, Urine 1.024 1.005 - 1.030   pH 7.0 5.0 - 8.0   Glucose, UA NEGATIVE NEGATIVE mg/dL   Hgb urine dipstick NEGATIVE NEGATIVE   Bilirubin Urine SMALL (A) NEGATIVE   Ketones, ur NEGATIVE NEGATIVE mg/dL   Protein, ur 542 (A) NEGATIVE mg/dL   Nitrite NEGATIVE NEGATIVE   Leukocytes, UA NEGATIVE NEGATIVE   RBC / HPF 0-5 0 - 5 RBC/hpf   WBC, UA 0-5 0 - 5 WBC/hpf   Bacteria, UA NONE SEEN NONE SEEN   Squamous Epithelial / LPF NONE SEEN NONE SEEN   Mucous PRESENT   Occult blood gastric / duodenum     Status: Abnormal   Collection Time: 05/09/16  3:19 PM  Result  Value Ref Range   Occult Blood, Gastric POSITIVE (A) NEGATIVE  POC occult blood, ED     Status: None   Collection Time: 05/09/16  3:39 PM  Result Value Ref Range   Fecal Occult Bld NEGATIVE  NEGATIVE  Comprehensive metabolic panel     Status: Abnormal   Collection Time: 05/10/16  4:43 AM  Result Value Ref Range   Sodium 140 135 - 145 mmol/L   Potassium 3.5 3.5 - 5.1 mmol/L   Chloride 107 101 - 111 mmol/L   CO2 26 22 - 32 mmol/L   Glucose, Bld 71 65 - 99 mg/dL   BUN 7 6 - 20 mg/dL   Creatinine, Ser 0.63 0.61 - 1.24 mg/dL   Calcium 8.2 (L) 8.9 - 10.3 mg/dL   Total Protein 5.6 (L) 6.5 - 8.1 g/dL   Albumin 2.6 (L) 3.5 - 5.0 g/dL   AST 14 (L) 15 - 41 U/L   ALT 11 (L) 17 - 63 U/L   Alkaline Phosphatase 104 38 - 126 U/L   Total Bilirubin 0.6 0.3 - 1.2 mg/dL   GFR calc non Af Amer >60 >60 mL/min   GFR calc Af Amer >60 >60 mL/min    Comment: (NOTE) The eGFR has been calculated using the CKD EPI equation. This calculation has not been validated in all clinical situations. eGFR's persistently <60 mL/min signify possible Chronic Kidney Disease.    Anion gap 7 5 - 15  CBC     Status: Abnormal   Collection Time: 05/10/16  4:43 AM  Result Value Ref Range   WBC 8.2 4.0 - 10.5 K/uL   RBC 2.84 (L) 4.22 - 5.81 MIL/uL   Hemoglobin 9.0 (L) 13.0 - 17.0 g/dL    Comment: DELTA CHECK NOTED REPEATED TO VERIFY    HCT 27.2 (L) 39.0 - 52.0 %   MCV 95.8 78.0 - 100.0 fL   MCH 31.7 26.0 - 34.0 pg   MCHC 33.1 30.0 - 36.0 g/dL   RDW 14.5 11.5 - 15.5 %   Platelets 143 (L) 150 - 400 K/uL  Lipase, blood     Status: None   Collection Time: 05/10/16  4:43 AM  Result Value Ref Range   Lipase 43 11 - 51 U/L  Phenytoin level, total     Status: Abnormal   Collection Time: 05/10/16  4:43 AM  Result Value Ref Range   Phenytoin Lvl 3.0 (L) 10.0 - 20.0 ug/mL  Magnesium     Status: None   Collection Time: 05/10/16  9:07 AM  Result Value Ref Range   Magnesium 1.7 1.7 - 2.4 mg/dL  Phosphorus     Status: None    Collection Time: 05/10/16  9:07 AM  Result Value Ref Range   Phosphorus 2.8 2.5 - 4.6 mg/dL    Dg Chest 2 View  Result Date: 05/09/2016 CLINICAL DATA:  Chest pain and weight loss EXAM: CHEST  2 VIEW COMPARISON:  Chest radiograph April 06, 2016 and chest CT April 28, 2016 FINDINGS: There is stable mild blunting of the left costophrenic angle. There is no edema or consolidation. Nodular opacity adjacent to the aortic arch on the left is appreciable by radiography but better seen by CT. Heart size is normal. The pulmonary vascularity is normal. There is left hilar adenopathy, stable. No new adenopathy evident. No bone lesions. Coils are noted in the upper abdomen on the left. There are multiple calcified splenic granulomas. There is aortic atherosclerosis. IMPRESSION: Persistent left hilar adenopathy. Stable nodular opacity adjacent to the aortic arch on the left. No edema or consolidation. Mild scarring lateral left base. Aortic atherosclerosis. Calcified splenic granulomas present. Electronically Signed   By: Lowella Grip III M.D.   On: 05/09/2016 11:26  Ct Abdomen Pelvis W Contrast  Result Date: 05/09/2016 CLINICAL DATA:  Hemoptysis and abdominal pain over the last 2 days. Recent weight loss. EXAM: CT ABDOMEN AND PELVIS WITH CONTRAST TECHNIQUE: Multidetector CT imaging of the abdomen and pelvis was performed using the standard protocol following bolus administration of intravenous contrast. CONTRAST:  75mL ISOVUE-300 IOPAMIDOL (ISOVUE-300) INJECTION 61% COMPARISON:  11/29/2014. FINDINGS: Lower chest: Lung bases are clear.  No pleural or pericardial fluid. Hepatobiliary: Multiple scattered hepatic granulomas as seen previously. No acute liver finding. No calcified gallstones. Pancreas: Chronic pancreatic calcifications. There appears to be pronounced active inflammation in the lesser sac with a 6-7 cm phlegmonous inflammatory mass with a central low density measuring about 2 cm that could  represent actual abscess. This indents the posterior wall of the stomach. Spleen: Multiple calcified granulomas.  Otherwise unremarkable. Adrenals/Urinary Tract: Adrenal glands are normal. Kidneys are normal. Stomach/Bowel: Gastrostomy tube in the stomach. No evidence of bowel obstruction or primary bowel pathology. Vascular/Lymphatic: Aortic atherosclerosis.  No aneurysm. Reproductive: Negative Other: No free fluid or air. Musculoskeletal: Negative IMPRESSION: Acute inflammation in the lesser sac region, likely secondary to chronic pancreatitis. Phlegmonous inflammatory mass measuring approximately 7 cm in diameter. Central nonenhancing region measuring about 2 cm in diameter that could represent a central abscess. Electronically Signed   By: Paulina Fusi M.D.   On: 05/09/2016 15:36    Review of Systems  Constitutional: Positive for weight loss. Negative for chills and fever.  HENT: Negative for hearing loss.   Eyes: Negative for blurred vision and double vision.  Respiratory: Negative for cough and hemoptysis.   Cardiovascular: Negative for chest pain and palpitations.  Gastrointestinal: Positive for abdominal pain, nausea and vomiting.  Genitourinary: Negative for dysuria and urgency.  Musculoskeletal: Negative for myalgias and neck pain.  Skin: Negative for itching and rash.  Neurological: Negative for dizziness, tingling and headaches.  Endo/Heme/Allergies: Does not bruise/bleed easily.  Psychiatric/Behavioral: Negative for depression and suicidal ideas.   Blood pressure (!) 100/50, pulse 67, temperature 97.7 F (36.5 C), temperature source Oral, resp. rate 16, height 5\' 6"  (1.676 m), weight 42.6 kg (93 lb 14.7 oz), SpO2 99 %. Physical Exam  Vitals reviewed. Constitutional: He is oriented to person, place, and time. He appears well-developed. He appears cachectic.  HENT:  Head: Normocephalic and atraumatic.  Eyes: Conjunctivae and EOM are normal. Pupils are equal, round, and reactive to  light.  Neck: Normal range of motion. Neck supple.  Cardiovascular: Normal rate and regular rhythm.   Respiratory: Effort normal and breath sounds normal.  GI: Soft. Bowel sounds are normal. He exhibits no distension. There is tenderness.  Musculoskeletal: Normal range of motion.  Neurological: He is alert and oriented to person, place, and time.  Skin: Skin is warm and dry.  Psychiatric: He has a normal mood and affect. His behavior is normal.    Assessment/Plan: 56 yo male with pancreatitis with CT scan concerning for walled off fluid collection anteriosuperior to pancreas amidst a large amount of edema. It does not appear to have direct connection to the major pancreatic duct. It does appear to have a window above the antrum of the stomach. It is only 2-3cm in greatest dimension. His laboratory values seem to have improved in the last 24h so likely his pancreatitis is not so severe that he would not improve with Abx and bowel rest only, but drainage of this collection may help overall resolution. -In addition, conversion of the G tube to GJ tube may allow  for postpyloric enteral feeds sooner if this does become a longer disease process than expected. -agree with Dr. Ulyses Amor plan with PICC and TPN at this time -since he will be in hospital for a while, may warrant further lung mass work up to ensure appropriate goals of care  Arta Bruce Pennye Beeghly 05/10/2016, 11:32 AM

## 2016-05-10 NOTE — Progress Notes (Signed)
Subjective: Nausea and vomiting.  Objective: Vital signs in last 24 hours: Temp:  [97.6 F (36.4 C)-97.9 F (36.6 C)] 97.9 F (36.6 C) (04/29 0500) Pulse Rate:  [53-91] 61 (04/29 0500) Resp:  [12-20] 16 (04/29 0500) BP: (104-132)/(54-82) 122/55 (04/29 0500) SpO2:  [92 %-100 %] 99 % (04/29 0500) Weight:  [42.6 kg (93 lb 14.7 oz)-43.1 kg (95 lb)] 42.6 kg (93 lb 14.7 oz) (04/29 0250)    Intake/Output from previous day: 04/28 0701 - 04/29 0700 In: 3040.4 [P.O.:30; I.V.:1910.4; IV Piggyback:1100] Out: 0  Intake/Output this shift: No intake/output data recorded.  General appearance: cachectic and sick appearing GI: soft, non-tender; bowel sounds normal; no masses,  no organomegaly  Lab Results:  Recent Labs  05/09/16 1152 05/10/16 0443  WBC 12.0* 8.2  HGB 12.2* 9.0*  HCT 36.3* 27.2*  PLT 181 143*   BMET  Recent Labs  05/09/16 1152 05/10/16 0443  NA 138 140  K 3.9 3.5  CL 101 107  CO2 26 26  GLUCOSE 112* 71  BUN 17 7  CREATININE 0.79 0.63  CALCIUM 8.9 8.2*   LFT  Recent Labs  05/10/16 0443  PROT 5.6*  ALBUMIN 2.6*  AST 14*  ALT 11*  ALKPHOS 104  BILITOT 0.6   PT/INR No results for input(s): LABPROT, INR in the last 72 hours. Hepatitis Panel No results for input(s): HEPBSAG, HCVAB, HEPAIGM, HEPBIGM in the last 72 hours. C-Diff No results for input(s): CDIFFTOX in the last 72 hours. Fecal Lactopherrin No results for input(s): FECLLACTOFRN in the last 72 hours.  Studies/Results: Dg Chest 2 View  Result Date: 05/09/2016 CLINICAL DATA:  Chest pain and weight loss EXAM: CHEST  2 VIEW COMPARISON:  Chest radiograph April 06, 2016 and chest CT April 28, 2016 FINDINGS: There is stable mild blunting of the left costophrenic angle. There is no edema or consolidation. Nodular opacity adjacent to the aortic arch on the left is appreciable by radiography but better seen by CT. Heart size is normal. The pulmonary vascularity is normal. There is left hilar  adenopathy, stable. No new adenopathy evident. No bone lesions. Coils are noted in the upper abdomen on the left. There are multiple calcified splenic granulomas. There is aortic atherosclerosis. IMPRESSION: Persistent left hilar adenopathy. Stable nodular opacity adjacent to the aortic arch on the left. No edema or consolidation. Mild scarring lateral left base. Aortic atherosclerosis. Calcified splenic granulomas present. Electronically Signed   By: Lowella Grip III M.D.   On: 05/09/2016 11:26   Ct Abdomen Pelvis W Contrast  Result Date: 05/09/2016 CLINICAL DATA:  Hemoptysis and abdominal pain over the last 2 days. Recent weight loss. EXAM: CT ABDOMEN AND PELVIS WITH CONTRAST TECHNIQUE: Multidetector CT imaging of the abdomen and pelvis was performed using the standard protocol following bolus administration of intravenous contrast. CONTRAST:  55m ISOVUE-300 IOPAMIDOL (ISOVUE-300) INJECTION 61% COMPARISON:  11/29/2014. FINDINGS: Lower chest: Lung bases are clear.  No pleural or pericardial fluid. Hepatobiliary: Multiple scattered hepatic granulomas as seen previously. No acute liver finding. No calcified gallstones. Pancreas: Chronic pancreatic calcifications. There appears to be pronounced active inflammation in the lesser sac with a 6-7 cm phlegmonous inflammatory mass with a central low density measuring about 2 cm that could represent actual abscess. This indents the posterior wall of the stomach. Spleen: Multiple calcified granulomas.  Otherwise unremarkable. Adrenals/Urinary Tract: Adrenal glands are normal. Kidneys are normal. Stomach/Bowel: Gastrostomy tube in the stomach. No evidence of bowel obstruction or primary bowel pathology. Vascular/Lymphatic: Aortic atherosclerosis.  No aneurysm. Reproductive: Negative Other: No free fluid or air. Musculoskeletal: Negative IMPRESSION: Acute inflammation in the lesser sac region, likely secondary to chronic pancreatitis. Phlegmonous inflammatory mass  measuring approximately 7 cm in diameter. Central nonenhancing region measuring about 2 cm in diameter that could represent a central abscess. Electronically Signed   By: Nelson Chimes M.D.   On: 05/09/2016 15:36    Medications:  Scheduled: . phenytoin  300 mg Oral Daily  . sodium chloride flush  3 mL Intravenous Q12H  . traZODone  200 mg Oral QHS   Continuous: . sodium chloride 125 mL/hr at 05/10/16 0216  . meropenem (MERREM) IV 1 g (05/10/16 0657)    Assessment/Plan: 1) Pancreatic phlegmon. 2) Nausea and vomiting. 3) Severe malnutrition. 4) Chronic calcific pancreatitis.   No significant change.  WBC has decreased with meropenem.  The CT scan does reveal extrinsic compression of the phlegmon into the gastric lumen, but there is no clear evidence of GOO.  However, this can be a reason for his nausea and vomiting.  Plan: 1) TPN.   2) PICC line. 3) Surgery and IR consultation.  ? Drainage. 4) Continue with Meropenem.  LOS: 0 days   Marcus Brooks D 05/10/2016, 8:24 AM

## 2016-05-10 NOTE — Progress Notes (Signed)
PHARMACY - ADULT TOTAL PARENTERAL NUTRITION CONSULT NOTE   Pharmacy Consult for TPN Indication: Calcific Pancreatitis / pancreatic phlegmon  Patient Measurements: Height: '5\' 6"'$  (167.6 cm) Weight: 93 lb 14.7 oz (42.6 kg) IBW/kg (Calculated) : 63.8 TPN AdjBW (KG): 42.6 Body mass index is 15.16 kg/m.  Assessment:  56 yo M presents on 4/28 with abdominal pain and persistent nausea and vomiting. 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 Also has severe protein-calorie malnutrition and unintentional weight loss. Denies any alcohol for over one year. CT in ED shows phlegmonous mass and GI worried there is necrosis. Plan to treat supportively with TPN and abx for ~4 weeks, then if fluid collection persists he may need endoscopic drainage at [place] consulted to start TPN. At risk for refeeding syndrome.  GI: Acute on chronic pancreatitis. Lipase was close to normal on admit at 63. Has severe protein-calorie malnutrition, at risk for refeeding syndrome. CT shows acute inflammation in the lesser sac region and a phlegmonous mass (7cm) GI following. Albumin low at 2.6.  Endo: No hx of DM. CBGs ok Insulin requirements in the past 24 hours: 0 units Lytes: wnl exc K borderline low at 3.5. Mg borderline low at 1.7 and Phos borderline low at 2.8. CoCa 9.1. Renal: SCr stable, CrCl ~19m/min. UOP not charted Pulm: RA Cards: BP and HR ok. Hepatobil: Tbili wnl, LFTs ok.  Neuro: On phenytoin for hx of seizures. Also on trazodone and oxy / dilaudid prn ID: On meropenem for pancreatitis and pancreatic phlegmon. Afebrile, WBC down to wnl.  Best Practices: SCDs TPN Access: PICC ordered and IV team to place this morning TPN start date: 4/29 >>  Nutritional Goals (per RD recommendation on ): kCal: 1,350-1,550 Protein: 55-65 g   Current Nutrition:  NPO  Plan:  Start Clinimix E 5/15 at 20 ml/hr Start 20% lipid emulsion at 5 ml/hr over 12 hrs Decrease NS to 1069mhr when TPN  starts tonight TPN and IV lipid emulsion provides 24 g of protein and 461 kCals. Will advance TPN slowly to goal Add MVI in TPN Add TE in TPN every other day, next 4/30 Add thiamine '100mg'$  and folic acid '1mg'$  to TPN Start sensitive SSI tonight and adjust as needed Monitor TPN labs, Trig closely in case need to hold F/U RD recs  Give Mg 2g IV x 1 Give potassium chloride 3023mIV x 1 run today Give potassium phosphate 38m45mIV x 1 today  NathElenor QuinonesarmD, BCPSThe Orthopaedic Institute Surgery Ctrnical Pharmacist Pager [phone] 8:56 AM

## 2016-05-11 ENCOUNTER — Encounter (HOSPITAL_COMMUNITY): Payer: Self-pay | Admitting: Physician Assistant

## 2016-05-11 ENCOUNTER — Ambulatory Visit: Payer: Medicaid Other | Admitting: Family Medicine

## 2016-05-11 DIAGNOSIS — K859 Acute pancreatitis without necrosis or infection, unspecified: Principal | ICD-10-CM

## 2016-05-11 DIAGNOSIS — K861 Other chronic pancreatitis: Secondary | ICD-10-CM

## 2016-05-11 DIAGNOSIS — E43 Unspecified severe protein-calorie malnutrition: Secondary | ICD-10-CM

## 2016-05-11 LAB — COMPREHENSIVE METABOLIC PANEL
ALK PHOS: 86 U/L (ref 38–126)
ALT: 10 U/L — AB (ref 17–63)
AST: 15 U/L (ref 15–41)
Albumin: 2.4 g/dL — ABNORMAL LOW (ref 3.5–5.0)
Anion gap: 5 (ref 5–15)
BUN: 6 mg/dL (ref 6–20)
CHLORIDE: 106 mmol/L (ref 101–111)
CO2: 25 mmol/L (ref 22–32)
CREATININE: 0.57 mg/dL — AB (ref 0.61–1.24)
Calcium: 7.8 mg/dL — ABNORMAL LOW (ref 8.9–10.3)
GFR calc Af Amer: >60 mL/min (ref >60)
Glucose, Bld: 88 mg/dL (ref 65–99)
Potassium: 3.5 mmol/L (ref 3.5–5.1)
Sodium: 136 mmol/L (ref 135–145)
Total Bilirubin: 0.5 mg/dL (ref 0.3–1.2)
Total Protein: 5.2 g/dL — ABNORMAL LOW (ref 6.5–8.1)

## 2016-05-11 LAB — DIFFERENTIAL
Basophils Absolute: 0 10*3/uL (ref 0.0–0.1)
Basophils Relative: 0 %
EOS PCT: 3 %
Eosinophils Absolute: 0.2 10*3/uL (ref 0.0–0.7)
LYMPHS ABS: 1.4 10*3/uL (ref 0.7–4.0)
Lymphocytes Relative: 20 %
Monocytes Absolute: 0.5 10*3/uL (ref 0.1–1.0)
Monocytes Relative: 7 %
NEUTROS PCT: 70 %
Neutro Abs: 5 10*3/uL (ref 1.7–7.7)

## 2016-05-11 LAB — CBC
HCT: 25.5 % — ABNORMAL LOW (ref 39.0–52.0)
HEMOGLOBIN: 8.9 g/dL — AB (ref 13.0–17.0)
MCH: 33.1 pg (ref 26.0–34.0)
MCHC: 34.9 g/dL (ref 30.0–36.0)
MCV: 94.8 fL (ref 78.0–100.0)
Platelets: 135 10*3/uL — ABNORMAL LOW (ref 150–400)
RBC: 2.69 MIL/uL — ABNORMAL LOW (ref 4.22–5.81)
RDW: 14.4 % (ref 11.5–15.5)
WBC: 7.1 10*3/uL (ref 4.0–10.5)

## 2016-05-11 LAB — GLUCOSE, CAPILLARY
GLUCOSE-CAPILLARY: 110 mg/dL — AB (ref 65–99)
GLUCOSE-CAPILLARY: 93 mg/dL (ref 65–99)
GLUCOSE-CAPILLARY: 98 mg/dL (ref 65–99)
Glucose-Capillary: 88 mg/dL (ref 65–99)
Glucose-Capillary: 89 mg/dL (ref 65–99)
Glucose-Capillary: 108 mg/dL — ABNORMAL HIGH (ref 65–99)

## 2016-05-11 LAB — PHOSPHORUS: Phosphorus: 1.7 mg/dL — ABNORMAL LOW (ref 2.5–4.6)

## 2016-05-11 LAB — TRIGLYCERIDES: TRIGLYCERIDES: 55 mg/dL (ref <150)

## 2016-05-11 LAB — PREALBUMIN: Prealbumin: 5.9 mg/dL — ABNORMAL LOW (ref 18–38)

## 2016-05-11 LAB — MAGNESIUM: MAGNESIUM: 1.7 mg/dL (ref 1.7–2.4)

## 2016-05-11 MED ORDER — TRACE MINERALS CR-CU-MN-SE-ZN 10-1000-500-60 MCG/ML IV SOLN
INTRAVENOUS | Status: AC
  Administered 2016-05-11: 18:00:00 via INTRAVENOUS
  Filled 2016-05-11: qty 480

## 2016-05-11 MED ORDER — POTASSIUM PHOSPHATES 15 MMOLE/5ML IV SOLN
15.0000 mmol | Freq: Once | INTRAVENOUS | Status: AC
  Administered 2016-05-11: 15 mmol via INTRAVENOUS
  Filled 2016-05-11 (×2): qty 5

## 2016-05-11 MED ORDER — SODIUM CHLORIDE 0.9 % IV SOLN
30.0000 meq | Freq: Once | INTRAVENOUS | Status: AC
  Administered 2016-05-11: 30 meq via INTRAVENOUS
  Filled 2016-05-11: qty 15

## 2016-05-11 MED ORDER — SODIUM CHLORIDE 0.9 % IV SOLN
1.5000 g | Freq: Four times a day (QID) | INTRAVENOUS | Status: DC
  Administered 2016-05-11 – 2016-05-14 (×11): 1.5 g via INTRAVENOUS
  Filled 2016-05-11 (×12): qty 1.5

## 2016-05-11 MED ORDER — FAT EMULSION 20 % IV EMUL
60.0000 mL | INTRAVENOUS | Status: AC
  Administered 2016-05-11: 60 mL via INTRAVENOUS
  Filled 2016-05-11: qty 250

## 2016-05-11 MED ORDER — FREE WATER
100.0000 mL | Freq: Three times a day (TID) | Status: DC
  Administered 2016-05-11 – 2016-05-15 (×10): 100 mL

## 2016-05-11 MED ORDER — MAGNESIUM SULFATE 50 % IJ SOLN
3.0000 g | Freq: Once | INTRAMUSCULAR | Status: AC
  Administered 2016-05-11: 3 g via INTRAVENOUS
  Filled 2016-05-11: qty 6

## 2016-05-11 NOTE — Progress Notes (Signed)
Pharmacy Antibiotic Note  Marcus Brooks is a 56 y.o. male admitted on 05/09/2016 with intra-abdominal infection.  Pharmacy has been consulted for Unasyn dosing. Patient previously on meropenem. No known history of ESBL infections.   Plan: Unasyn 1.5g IV q6h Follow renal function, clinical progression, any cultures, length of therapy  Height: '5\' 6"'$  (167.6 cm) Weight: 95 lb 7.4 oz (43.3 kg) IBW/kg (Calculated) : 63.8  Temp (24hrs), Avg:98.4 F (36.9 C), Min:98.1 F (36.7 C), Max:98.7 F (37.1 C)   Recent Labs Lab 05/09/16 1152 05/09/16 1211 05/10/16 0443 05/11/16 0430  WBC 12.0*  --  8.2 7.1  CREATININE 0.79  --  0.63 0.57*  LATICACIDVEN  --  1.86  --   --     Estimated Creatinine Clearance: 63.9 mL/min (A) (by C-G formula based on SCr of 0.57 mg/dL (L)).    Allergies  Allergen Reactions  . Latex Rash    Antimicrobials this admission: Meropenem 4/28 >> 4/30 Unasyn 4/30 >>   Dose adjustments this admission: n/a  Microbiology results: No cultures drawn  Thank you for allowing pharmacy to be a part of this patient's care.  Nyasha Rahilly D. Riyana Biel, PharmD, BCPS Clinical Pharmacist Pager: [phone] 05/11/2016 3:47 PM

## 2016-05-11 NOTE — Progress Notes (Signed)
Initial Nutrition Assessment  DOCUMENTATION CODES:   Severe malnutrition in context of chronic illness, Underweight  INTERVENTION:   Clinimix TPN 5%AA/15%Dextrose at 33m/hr plus 20% ILE at 5 ml/hr for 12 hours to provide 461 kcals and 24 g protein and 540 mL of fluid in 24 hours. Current rate meets 32% of the low end of estimated calorie needs and protein needs.   TPN per pharmacy. Pt is at very high risk for refeeding syndrome; agree with slow titration of TPN to goal rate with aggressive repletion of electrolytes  Agree with initiation of enteral nutrition as soon as clinically feasible. Pt would like benefit from J-tube, if G-tube feedings is not an option   NUTRITION DIAGNOSIS:   Malnutrition (Severe) related to chronic illness (chronic pancreatitis, EtOH abuse, lung mass) as evidenced by severe depletion of body fat, severe depletion of muscle mass.  GOAL:   Patient will meet greater than or equal to 90% of their needs  MONITOR:   Labs, Weight trends, I & O's (TPN tolerance, transition to TF)  REASON FOR ASSESSMENT:   Consult New TPN/TNA  ASSESSMENT:   56yo male admitted with N/V with acute on chronic pancreatitis.  Pt with hx of EtOH abuse, seizures, chronic pancreatitis, lung mass, feeding tube  Pt alert but sleepy on visit today. Appears confused. CIWA protocol. Pt reports nausea today, no vomiting yet.  Pt reports he has only been able to tolerate water for weeks and has not been using his G-tube for feedings. Pt reports he had been giving himself 3 cans of Ensure per day via G-tube but unclear when the last time he did this was.  CT 4/28/ significant for two fluid collections - anterior pancreatic tail (3cm) and an edematous lesser sac w/ 2cm collection communicating w/ posterior wall of the stomach. Neither collection amenable to drainage by IR.  Pt with extrinsic compression of the phlegmon into the gastric lumen but no clear evidence of GOO. Bed-bound for last  several weeks as he has been "too weak to walk" Noted pt has G-tube and plan for possible conversion to G-J tube Noted order for 100 mL free water flush via G-tube every 8 hours  Nutrition-Focused physical exam completed. Findings are severe fat depletion, severe muscle depletion, and no edema.   Labs: phosphorus 1.7 (supplementing), TG wdl, CBGs wdl Meds: NS at 100 ml/hr, ss novolog  Diet Order:  Diet NPO time specified Except for: Sips with Meds TPN (CLINIMIX-E) Adult TPN (CLINIMIX-E) Adult  Skin:  Reviewed, no issues  Last BM:  no documented BM  Height:   Ht Readings from Last 1 Encounters:  05/09/16 '5\' 6"'$  (1.676 m)    Weight:   Wt Readings from Last 1 Encounters:  05/11/16 95 lb 7.4 oz (43.3 kg)    Ideal Body Weight:  64.5 kg  BMI:  Body mass index is 15.41 kg/m.  Estimated Nutritional Needs:   Kcal:  1450-1680 kcals  Protein:  70-85 g  Fluid:  >/= 1.5 L  EDUCATION NEEDS:   No education needs identified at this time  [place]allonia R[phone]  ([phone]/On-Call Pager

## 2016-05-11 NOTE — Progress Notes (Signed)
Daily Rounding Note  05/11/2016, 10:59 AM  LOS: 1 day   SUBJECTIVE:   Chief complaint:  Feels lousy.  Pain LUQ.       OBJECTIVE:         Vital signs in last 24 hours:    Temp:  [98.1 F (36.7 C)-98.7 F (37.1 C)] 98.7 F (37.1 C) (04/30 1015) Pulse Rate:  [59-69] 61 (04/30 1015) Resp:  [15-16] 16 (04/30 1015) BP: (114-134)/(60-85) 114/61 (04/30 1015) SpO2:  [97 %-100 %] 98 % (04/30 1015) Weight:  [43.3 kg (95 lb 6.4 oz)-43.3 kg (95 lb 7.4 oz)] 43.3 kg (95 lb 7.4 oz) (04/30 0152)   Filed Weights   05/10/16 0250 05/10/16 2052 05/11/16 0152  Weight: 42.6 kg (93 lb 14.7 oz) 43.3 kg (95 lb 6.4 oz) 43.3 kg (95 lb 7.4 oz)   General: extremely malnourished, cachectic, pale, chronically ill looking   Heart: RRR Chest: diminished but clear.  + loose cough, no dyspnea at rest Abdomen: scaphoid, tender without guard/rebound in LUQ.  BS normal timbre but hypoactive.  Feeding tube site with minor erythema in local region of tube.   Extremities: muscle wasting.  Moves all 4 limbs Neuro/Psych:  Lethargic, arouseable.  Affect severely flat/blunted.  Cooperative and follows commands.  Oriented x 3.    Intake/Output from previous day: 04/29 0701 - 04/30 0700 In: 1489.1 [I.V.:1389.1; IV Piggyback:100] Out: 1525 [Urine:1525]  Intake/Output this shift: Total I/O In: 0  Out: 250 [Urine:250]  Lab Results:  Recent Labs  05/09/16 1152 05/10/16 0443 05/11/16 0430  WBC 12.0* 8.2 7.1  HGB 12.2* 9.0* 8.9*  HCT 36.3* 27.2* 25.5*  PLT 181 143* 135*   BMET  Recent Labs  05/09/16 1152 05/10/16 0443 05/11/16 0430  NA 138 140 136  K 3.9 3.5 3.5  CL 101 107 106  CO2 '26 26 25  '$ GLUCOSE 112* 71 88  BUN '17 7 6  '$ CREATININE 0.79 0.63 0.57*  CALCIUM 8.9 8.2* 7.8*   LFT  Recent Labs  05/09/16 1152 05/10/16 0443 05/11/16 0430  PROT 7.5 5.6* 5.2*  ALBUMIN 3.6 2.6* 2.4*  AST 24 14* 15  ALT 13* 11* 10*  ALKPHOS 143* 104 86    BILITOT 0.7 0.6 0.5   PT/INR  Recent Labs  05/10/16 1329  LABPROT 14.4  INR 1.12   Hepatitis Panel No results for input(s): HEPBSAG, HCVAB, HEPAIGM, HEPBIGM in the last 72 hours.  Studies/Results: Dg Chest 2 View  Result Date: 05/09/2016 CLINICAL DATA:  Chest pain and weight loss EXAM: CHEST  2 VIEW COMPARISON:  Chest radiograph April 06, 2016 and chest CT April 28, 2016 FINDINGS: There is stable mild blunting of the left costophrenic angle. There is no edema or consolidation. Nodular opacity adjacent to the aortic arch on the left is appreciable by radiography but better seen by CT. Heart size is normal. The pulmonary vascularity is normal. There is left hilar adenopathy, stable. No new adenopathy evident. No bone lesions. Coils are noted in the upper abdomen on the left. There are multiple calcified splenic granulomas. There is aortic atherosclerosis. IMPRESSION: Persistent left hilar adenopathy. Stable nodular opacity adjacent to the aortic arch on the left. No edema or consolidation. Mild scarring lateral left base. Aortic atherosclerosis. Calcified splenic granulomas present. Electronically Signed   By: Lowella Grip III M.D.   On: 05/09/2016 11:26   Ct Abdomen Pelvis W Contrast  Result Date: 05/09/2016 CLINICAL DATA:  Hemoptysis and  abdominal pain over the last 2 days. Recent weight loss. EXAM: CT ABDOMEN AND PELVIS WITH CONTRAST TECHNIQUE: Multidetector CT imaging of the abdomen and pelvis was performed using the standard protocol following bolus administration of intravenous contrast. CONTRAST:  62m ISOVUE-300 IOPAMIDOL (ISOVUE-300) INJECTION 61% COMPARISON:  11/29/2014. FINDINGS: Lower chest: Lung bases are clear.  No pleural or pericardial fluid. Hepatobiliary: Multiple scattered hepatic granulomas as seen previously. No acute liver finding. No calcified gallstones. Pancreas: Chronic pancreatic calcifications. There appears to be pronounced active inflammation in the lesser sac  with a 6-7 cm phlegmonous inflammatory mass with a central low density measuring about 2 cm that could represent actual abscess. This indents the posterior wall of the stomach. Spleen: Multiple calcified granulomas.  Otherwise unremarkable. Adrenals/Urinary Tract: Adrenal glands are normal. Kidneys are normal. Stomach/Bowel: Gastrostomy tube in the stomach. No evidence of bowel obstruction or primary bowel pathology. Vascular/Lymphatic: Aortic atherosclerosis.  No aneurysm. Reproductive: Negative Other: No free fluid or air. Musculoskeletal: Negative IMPRESSION: Acute inflammation in the lesser sac region, likely secondary to chronic pancreatitis. Phlegmonous inflammatory mass measuring approximately 7 cm in diameter. Central nonenhancing region measuring about 2 cm in diameter that could represent a central abscess. Electronically Signed   By: MNelson ChimesM.D.   On: 05/09/2016 15:36   Scheduled Meds: . free water  100 mL Per Tube Q8H  . insulin aspart  0-9 Units Subcutaneous Q4H  . phenytoin (DILANTIN) IV  100 mg Intravenous Q8H  . sodium chloride flush  3 mL Intravenous Q12H   Continuous Infusions: . sodium chloride 100 mL/hr at 05/11/16 1129  . .Marland KitchenPN (CLINIMIX-E) Adult     And  . fat emulsion    . magnesium sulfate 1 - 4 g bolus IVPB 3 g (05/11/16 1131)  . meropenem (MERREM) IV Stopped (05/11/16 0630)  . potassium chloride (KCL MULTIRUN) 30 mEq in 265 mL IVPB    . potassium phosphate IVPB (mmol)    . .Marland KitchenPN (CLINIMIX-E) Adult 20 mL/hr at 05/10/16 1726   PRN Meds:.albuterol, HYDROmorphone (DILAUDID) injection, ondansetron **OR** ondansetron (ZOFRAN) IV, sodium chloride flush  ASSESMENT:   *  Acute on chronic pancreatitis with phlegmon with possible abscess within phlegmon.  Past etiology of pancreatitis with pseudocysts: ETOH.  On Meropenem.   Surgery following and rec is IV abx, bowel rest, repeat CT in 3 to 4 weeks.  TPN in place. Consider conversion of G tube to GFox Chasetube to allow for post  pyloric feeding.  Surgery signing off as no need for surgical intervention at present.   *  s/p 01/2011 colonoscopy ( 2 small polyps) and EGD ( HH, PEJ/PEG in place).  2014 EGD (DR BBritta Mccreedy  For hematemesis: HH, mild gastritis and esophagitis, no active bleeding).  06/2013 EGD (for hematemesis. small HH, prominent gastric folds, PEG/PEJ in place): Dr RLaural Golden     *  Severe protein and calorie malnutrition.  BMI 15. Chronic indwelling GJ tube. Has not tolerated TF dating back at least 2 weeks.  G tube replacement 02/2016.  Feeding tubes date back to at least 2010.  Tube fell out and replaced 02/19/2016.  Post tube replacement contrast study 02/19/16, contrast emptied into stomach. Treated for PEG site infection in late 02/2016   *  Left lung mass, left hilar adenopathy.  Stable c/w 04/06/16.  Concerning for malignancy per 04/28/16 CT scan.  Set for 05/20/16 office visit with Onc, Dr MEarlie Server   *  Normocytic anemia.  Hgb baseline ~ 12.5.  Hgb 12.2 >>  9.0 >> 8.9 over last 3 days.  No melena, no hematemesis.  FOBT negative 05/09/16.   Outpt colonoscopy set up for 05/22/16 in [place], Dr [name].    PLAN   *  TNA.  Can IR convert G to J tube?  If so could begin Jejunal feedings.  I placed consult request to IR.       Azucena Freed  05/11/2016, 10:59 AM Pager: [phone]
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
## 2016-05-11 NOTE — Final Consult Note (Signed)
[facility] Final Consult Note      Subjective: CC: abdominal pain, chronic pancreatitis  Patient in bed sleeping. States his epigastric abdominal pain I still present and constant, however it is improved. He rated his pain as 4/10. Denise nausea at present. States last episode of vomiting was yesterday. He is urinating and having bowel movements.  Objective: Vital signs in last 24 hours: Temp:  [97.7 F (36.5 C)-98.4 F (36.9 C)] 98.3 F (36.8 C) (04/30 0423) Pulse Rate:  [59-69] 69 (04/30 0423) Resp:  [15-16] 16 (04/30 0423) BP: (100-134)/(50-85) 134/85 (04/30 0423) SpO2:  [97 %-100 %] 100 % (04/30 0423) Weight:  [43.3 kg (95 lb 6.4 oz)-43.3 kg (95 lb 7.4 oz)] 43.3 kg (95 lb 7.4 oz) (04/30 0152)    Intake/Output from previous day: 04/29 0701 - 04/30 0700 In: 1489.1 [I.V.:1389.1; IV Piggyback:100] Out: 1525 [Urine:1525] Intake/Output this shift: No intake/output data recorded.  PE: Gen:  Somnolent, no acute distress, cooperative, cachectic Card:  Regular rate and rhythm, pedal pulses 2+ BL Pulm:  Normal effort, clear to auscultation bilaterally Abd: Soft, tender to light palpation epigastrium, bowel sounds present, G-tube site c/d/i Skin: warm and dry, no rashes  MSK: muscle wasting present, strength 4-5 in bilateral upper and lower extremities.  Psych: A&Ox3    Lab Results:   Recent Labs  05/10/16 0443 05/11/16 0430  WBC 8.2 7.1  HGB 9.0* 8.9*  HCT 27.2* 25.5*  PLT 143* 135*   BMET  Recent Labs  05/10/16 0443 05/11/16 0430  NA 140 136  K 3.5 3.5  CL 107 106  CO2 26 25  GLUCOSE 71 88  BUN 7 6  CREATININE 0.63 0.57*  CALCIUM 8.2* 7.8*   PT/INR  Recent Labs  05/10/16 1329  LABPROT 14.4  INR 1.12   CMP     Component Value Date/Time   NA 136 05/11/2016 0430   K 3.5 05/11/2016 0430   CL 106 05/11/2016 0430   CO2 25 05/11/2016 0430   GLUCOSE 88 05/11/2016 0430   BUN 6 05/11/2016 0430   CREATININE 0.57 (L) 05/11/2016 0430   CREATININE 0.80 04/06/2016 1514   CALCIUM 7.8 (L) 05/11/2016 0430   PROT 5.2 (L) 05/11/2016 0430   ALBUMIN 2.4 (L) 05/11/2016 0430   AST 15 05/11/2016 0430   ALT 10 (L) 05/11/2016 0430   ALKPHOS 86 05/11/2016 0430   BILITOT 0.5 05/11/2016 0430   GFRNONAA >60 05/11/2016 0430   GFRNONAA >89 04/06/2016 1514   GFRAA >60 05/11/2016 0430   GFRAA >89 04/06/2016 1514   Lipase     Component Value Date/Time   LIPASE 43 05/10/2016 0443       Studies/Results: Dg Chest 2 View  Result Date: 05/09/2016 CLINICAL DATA:  Chest pain and weight loss EXAM: CHEST  2 VIEW COMPARISON:  Chest radiograph April 06, 2016 and chest CT April 28, 2016 FINDINGS: There is stable mild blunting of the left costophrenic angle. There is no edema or consolidation. Nodular opacity adjacent to the aortic arch on the left is appreciable by radiography but better seen by CT. Heart size is normal. The pulmonary vascularity is normal. There is left hilar adenopathy, stable. No new adenopathy evident. No bone lesions. Coils are noted in the upper abdomen on the left. There are multiple calcified splenic granulomas. There is aortic atherosclerosis. IMPRESSION: Persistent left hilar adenopathy. Stable nodular opacity adjacent to the aortic arch on the left. No edema or consolidation. Mild scarring lateral left base. Aortic atherosclerosis.  Calcified splenic granulomas present. Electronically Signed   By: Lowella Grip III M.D.   On: 05/09/2016 11:26   Ct Abdomen Pelvis W Contrast  Result Date: 05/09/2016 CLINICAL DATA:  Hemoptysis and abdominal pain over the last 2 days. Recent weight loss. EXAM: CT ABDOMEN AND PELVIS WITH CONTRAST TECHNIQUE: Multidetector CT imaging of the abdomen and pelvis was performed using the standard protocol following bolus administration of intravenous contrast. CONTRAST:  40m ISOVUE-300 IOPAMIDOL (ISOVUE-300) INJECTION 61% COMPARISON:  11/29/2014. FINDINGS: Lower chest: Lung bases are clear.  No  pleural or pericardial fluid. Hepatobiliary: Multiple scattered hepatic granulomas as seen previously. No acute liver finding. No calcified gallstones. Pancreas: Chronic pancreatic calcifications. There appears to be pronounced active inflammation in the lesser sac with a 6-7 cm phlegmonous inflammatory mass with a central low density measuring about 2 cm that could represent actual abscess. This indents the posterior wall of the stomach. Spleen: Multiple calcified granulomas.  Otherwise unremarkable. Adrenals/Urinary Tract: Adrenal glands are normal. Kidneys are normal. Stomach/Bowel: Gastrostomy tube in the stomach. No evidence of bowel obstruction or primary bowel pathology. Vascular/Lymphatic: Aortic atherosclerosis.  No aneurysm. Reproductive: Negative Other: No free fluid or air. Musculoskeletal: Negative IMPRESSION: Acute inflammation in the lesser sac region, likely secondary to chronic pancreatitis. Phlegmonous inflammatory mass measuring approximately 7 cm in diameter. Central nonenhancing region measuring about 2 cm in diameter that could represent a central abscess. Electronically Signed   By: MNelson ChimesM.D.   On: 05/09/2016 15:36    Anti-infectives: Anti-infectives    Start     Dose/Rate Route Frequency Ordered Stop   05/09/16 2200  meropenem (MERREM) 1 g in sodium chloride 0.9 % 100 mL IVPB     1 g 200 mL/hr over 30 Minutes Intravenous Every 8 hours 05/09/16 1745       Assessment/Plan Acute on chronic pancreatitis 2/2 EtOH abuse with associated phlegmon - CT 4/28/ significant for two fluid collections - anterior pancreatic tail (3cm) and an edematous lesser sac w/ 2cm collection communicating w/ posterior wall of the stomach. Neither collection amenable to drainage by IR.  - no acute surgical needs, small abscesses will likely respond to IV abx and bowel rest. No definite signs of necrosis.  - consider repeat CT scan in 3-4 weeks to reevaluate fluid collections. - continue IV abx,  bowel rest, TPN  Nausea and vomiting - NPO, TPN, IVF; if pancreatitis clinically improves, could consider surgical conversion G tube to [place] post-pyloric TF  Severe malnutrition - prealbumin 5.9  FEN: NPO, TPN, hypophosphatemia,  ID: IV Meropenem 4/29>>; leukocytosis resolved   Patient has no acute surgical needs. General surgery will sign off. Call with questions/concerns. Thank you.    LOS: 1 day    EAuxierSurgery 05/11/2016, 7:54 AM Pager: [phone]: [phone]-Fri 7:00 am-4:30 pm Sat-Sun 7:00 am-11:30 am

## 2016-05-11 NOTE — Progress Notes (Signed)
IR aware of request for conversion of G to a GJ tube.  We will plan to attempt tomorrow 5/1.  Marcus Brooks E

## 2016-05-11 NOTE — Progress Notes (Signed)
PHARMACY - ADULT TOTAL PARENTERAL NUTRITION CONSULT NOTE   Pharmacy Consult for TPN Indication: Calcific Pancreatitis / pancreatic phlegmon  Patient Measurements: Height: '5\' 6"'$  (167.6 cm) Weight: 95 lb 7.4 oz (43.3 kg) IBW/kg (Calculated) : 63.8 TPN AdjBW (KG): 42.6 Body mass index is 15.41 kg/m.  Assessment:  56 yo M presents on 4/28 with abdominal pain and persistent nausea and vomiting. 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 Also has severe protein-calorie malnutrition and unintentional weight loss. Denies any alcohol for over one year. CT in ED shows phlegmonous mass and GI worried there is necrosis. Plan to treat supportively with TPN and abx for ~4 weeks, then if fluid collection persists he may need endoscopic drainage at [place] consulted to start TPN. At risk for refeeding syndrome.  GI: Acute on chronic pancreatitis with severe protein-calorie malnutrition, at risk for refeeding syndrome. Constant abd pain but improved, (-)nausea & last vomited 4/29, (+)bm's.  Abd soft, tender.  4/28 CT shows acute inflammation in the lesser sac region and a phlegmonous mass (7cm) GI following.  Radiology consult- fluid collections not ideal for perc drainage. rec repeat CT 3-4 wks.  Considering conversion of GT to [place] tube for post-pyloric feeding.  Surgery signed off 4/30.  Albumin low at 2.6.  Endo: No hx of DM. CBGs ok  Insulin requirements in the past 24 hours: 0 units Lytes:  K 3.5 stable, Mg 1.7 stable, Phos 1.7 < 2.8.  CoCa 9.1.  4/29 supplemented with Mg 2g, K 75mq (K-run + K Phos), and Phos 181ml. Renal: SCr stable, CrCl ~6042min. UOP 1.5L Pulm: RA.  Lung mass, considering w/u while in hospital Cards: BP and HR ok. Hepatobil: Tbili wnl, LFTs ok.  TG 55 Neuro:  Phenytoin '100mg'$  IV q8 for hx seizures, corrected level 4.8 on 4/29.  Also on lorazepam '1mg'$  IV q6, hydromorphone prn severe pain ID: Meropenem for pancreatitis and pancreatic phlegmon. Afebrile, WBC  down to wnl.  Best Practices: SCDs TPN Access: PICC 4/29 TPN start date: 4/29 >>  Nutritional Goals (per RD recommendation on ): kCal: 1,350-1,550 Protein: 55-65 g   Current Nutrition:  NPO  Plan:  Continue Clinimix E 5/15 at 20 ml/hr with evidence of re-feeding Start 20% lipid emulsion at 5 ml/hr over 12 hrs Continue NS at 100m77m, decreasing as able to advance TPN TPN and IV lipid emulsion provides 24 g of protein and 461 kCals. Will advance TPN slowly to goal Add MVI in TPN Add TE in TPN every other day, next 4/30 Add thiamine '100mg'$  and folic acid '1mg'$  to TPN Continue sensitive SSI tonight and adjust as needed Monitor TPN labs, Trig closely in case need to hold F/U RD recs  Give Mg 3g IV x 1 Give potassium chloride 30mE29m x 1 run today Give potassium phosphate 15mMo26m x 1 today (will provide 22mEq 8m) Repeat Phos level this PM   Anup Brigham Gracy Bruins PharmD Clinical Pharmacist [facility]

## 2016-05-11 NOTE — Progress Notes (Signed)
Late Entry: Pt was c/o stabbing chest pain around 2200. Pt vitals were: BP 121/62, O2 98 on RA, P 61, and CBG 90. This nurse called RR nurse Magda Paganini to come and assess. Once nurse arrived, pt stated that his chest was not hurting that it was his stomach, near his peg tube. This nurse called and let MD know what happened. Pt did not c/o pain or any discomfort throughout the rest of the shift.   Eleanora Neighbor, RN

## 2016-05-12 ENCOUNTER — Inpatient Hospital Stay (HOSPITAL_COMMUNITY): Payer: Medicaid Other

## 2016-05-12 LAB — COMPREHENSIVE METABOLIC PANEL
ALBUMIN: 2.5 g/dL — AB (ref 3.5–5.0)
ALT: 10 U/L — AB (ref 17–63)
AST: 19 U/L (ref 15–41)
Alkaline Phosphatase: 86 U/L (ref 38–126)
Anion gap: 3 — ABNORMAL LOW (ref 5–15)
CHLORIDE: 105 mmol/L (ref 101–111)
CO2: 26 mmol/L (ref 22–32)
CREATININE: 0.46 mg/dL — AB (ref 0.61–1.24)
Calcium: 7.9 mg/dL — ABNORMAL LOW (ref 8.9–10.3)
GFR calc Af Amer: >60 mL/min (ref >60)
GFR calc non Af Amer: >60 mL/min (ref >60)
GLUCOSE: 129 mg/dL — AB (ref 65–99)
POTASSIUM: 3.6 mmol/L (ref 3.5–5.1)
Sodium: 134 mmol/L — ABNORMAL LOW (ref 135–145)
Total Bilirubin: 0.5 mg/dL (ref 0.3–1.2)
Total Protein: 5.6 g/dL — ABNORMAL LOW (ref 6.5–8.1)

## 2016-05-12 LAB — GLUCOSE, CAPILLARY
GLUCOSE-CAPILLARY: 118 mg/dL — AB (ref 65–99)
GLUCOSE-CAPILLARY: 121 mg/dL — AB (ref 65–99)
GLUCOSE-CAPILLARY: 178 mg/dL — AB (ref 65–99)
Glucose-Capillary: 108 mg/dL — ABNORMAL HIGH (ref 65–99)
Glucose-Capillary: 132 mg/dL — ABNORMAL HIGH (ref 65–99)
Glucose-Capillary: 142 mg/dL — ABNORMAL HIGH (ref 65–99)

## 2016-05-12 LAB — PHOSPHORUS: Phosphorus: 1.9 mg/dL — ABNORMAL LOW (ref 2.5–4.6)

## 2016-05-12 MED ORDER — SODIUM CHLORIDE 0.9 % IV SOLN
30.0000 meq | Freq: Once | INTRAVENOUS | Status: AC
  Administered 2016-05-12: 30 meq via INTRAVENOUS
  Filled 2016-05-12: qty 15

## 2016-05-12 MED ORDER — THIAMINE HCL 100 MG/ML IJ SOLN
INTRAVENOUS | Status: AC
  Filled 2016-05-12: qty 720

## 2016-05-12 MED ORDER — POTASSIUM PHOSPHATES 15 MMOLE/5ML IV SOLN
20.0000 mmol | Freq: Once | INTRAVENOUS | Status: AC
  Administered 2016-05-12: 20 mmol via INTRAVENOUS
  Filled 2016-05-12: qty 6.67

## 2016-05-12 MED ORDER — CHLORHEXIDINE GLUCONATE 4 % EX LIQD
CUTANEOUS | Status: AC
  Filled 2016-05-12: qty 15

## 2016-05-12 MED ORDER — FAT EMULSION 20 % IV EMUL
120.0000 mL | INTRAVENOUS | Status: AC
  Filled 2016-05-12: qty 200

## 2016-05-12 MED ORDER — LIDOCAINE HCL 1 % IJ SOLN
INTRAMUSCULAR | Status: AC
  Filled 2016-05-12: qty 20

## 2016-05-12 NOTE — Progress Notes (Addendum)
Pt has been awake all night. He reports that he usually takes trazodone 200 mg po hs, as is noted in his home med reconciliation. Pt educated that this medication was previously discontinued and that he can inquire with the attending MD during rounds today. Pt also refused am Dilantin. Will report off to oncoming shift for follow-up. Dorthey Sawyer, RN

## 2016-05-12 NOTE — Progress Notes (Signed)
Patient has been brought down for conversion of his G to [place] tube.  The patient refuses this procedure.  He says he knows nothing about all of this and is not agreeable to move forward with anything until he can "talk to more people and his girlfriend."  We will send the patient back up to his room.  Please reorder if patient becomes agreeable for conversion.  Linday Rhodes E 3:03 PM 05/12/2016

## 2016-05-12 NOTE — Progress Notes (Signed)
PHARMACY - ADULT TOTAL PARENTERAL NUTRITION CONSULT NOTE   Pharmacy Consult for TPN Indication: Calcific Pancreatitis / pancreatic phlegmon  Patient Measurements: Height: '5\' 6"'$  (167.6 cm) Weight: 95 lb 7.4 oz (43.3 kg) IBW/kg (Calculated) : 63.8 TPN AdjBW (KG): 42.6 Body mass index is 15.41 kg/m.  Assessment:  56 yo M presents on 4/28 with abdominal pain and persistent nausea and vomiting. 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 Also has severe protein-calorie malnutrition and unintentional weight loss. Denies any alcohol for over one year. CT in ED shows phlegmonous mass and GI worried there is necrosis. Plan to treat supportively with TPN and abx for ~4 weeks, then if fluid collection persists he may need endoscopic drainage at [place] consulted to start TPN. At risk for refeeding syndrome.  GI: Acute on chronic pancreatitis with severe protein-calorie malnutrition, at risk for refeeding syndrome. Constant abd pain but improved, (-)nausea & last vomited 4/29, (+)bm's.  Abd soft, tender.  4/28 CT shows acute inflammation in the lesser sac region and a phlegmonous mass (7cm) GI following.  Radiology consult- fluid collections not ideal for perc drainage. rec repeat CT 3-4 wks.  Considering conversion of GT to [place] tube for post-pyloric feeding.  Surgery signed off 4/30.  Albumin low at 2.5.  Endo: No hx of DM. CBGs trending up since TPN initiated (80-140s) Insulin requirements in the past 24 hours: 2 units Lytes:  K 3.6 stable, Mg 1.7 on 4/30, Phos up slightly to 1.9. CoCa 8.9 Renal: SCr stable, CrCl ~37m/min. UOP 1.4 ml/kg/hr yesterday. On NS at 523mhr Pulm: RA.  Lung mass, considering w/u while in hospital Cards: BP and HR ok. Hepatobil: Tbili wnl, LFTs ok.  TG 55 Neuro:  Phenytoin '100mg'$  IV q8 for hx seizures, corrected level 4.8 on 4/29.  Also on lorazepam '1mg'$  IV q6, hydromorphone prn severe pain ID: On Unasyn for pancreatitis and pancreatic phlegmon.  Afebrile, WBC down to wnl.  Best Practices: SCDs TPN Access: PICC 4/29 TPN start date: 4/29 >>  Nutritional Goals (per RD recommendation on 4/30): kCal: [phone]  Protein: 70-85 g   Current Nutrition:  NPO Clinimix E 5/15 + IV lipid emulsions  Plan:  Increase Clinimix E 5/15 to 30 ml/hr Increase 20% lipid emulsion to 10 ml/hr over 12 hrs Continue NS at 10012mr, decreasing as able to advance TPN TPN and IV lipid emulsion provides 36 g of protein and 751 kCals. Will advance TPN slowly to goal Add MVI in TPN Add TE in TPN every other day, 5/2 Add thiamine '100mg'$  and folic acid '1mg'$  to TPN Continue sensitive SSI tonight and adjust as needed Monitor TPN labs, Bmet, Mg, and Phos tomorrow  Give potassium chloride 37m3mV x 1 run today Give potassium phosphate 20mM78mV x 1 today (will provide ~37mEq56mK) Repeat Phos level this PM  NathanElenor QuinonesmD, BCPS Clinical Pharmacist Pager [phone] 9:15 AM

## 2016-05-12 NOTE — Progress Notes (Signed)
Daily Rounding Note  05/12/2016, 11:33 AM  LOS: 2 days   SUBJECTIVE:   Chief complaint:  Afraid that placing GJ tube will cause pain.     Says he was ble to tolerate small amounts of TF at home, as c/w his telling me yesterday TF were causing vomiting.  No BMs  OBJECTIVE:         Vital signs in last 24 hours:    Temp:  [97.7 F (36.5 C)-98.6 F (37 C)] 98.6 F (37 C) (05/01 0925) Pulse Rate:  [49-59] 53 (05/01 0925) Resp:  [16-18] 16 (05/01 0925) BP: (115-123)/(61-73) 115/69 (05/01 0925) SpO2:  [98 %-100 %] 100 % (05/01 0925) Last BM Date: 05/11/16 Filed Weights   05/10/16 0250 05/10/16 2052 05/11/16 0152  Weight: 42.6 kg (93 lb 14.7 oz) 43.3 kg (95 lb 6.4 oz) 43.3 kg (95 lb 7.4 oz)   General: pale, chronically ill.  cachectic   Heart: RRR Chest: clear bil.  + cough Abdomen: soft, NT, scaphoid.  PEG site benign.  BS hypoactive.   Extremities: thin, muscle waisting.  No edema. Neuro/Psych:  Oriented x 3.  Moves all 4s.  Appropriate.  Slightly anxious.   Intake/Output from previous day: 04/30 0701 - 05/01 0700 In: 2057 [P.O.:120; I.V.:1687; NG/GT:100; IV Piggyback:50] Out: 5176 [Urine:1465]  Intake/Output this shift: Total I/O In: 0  Out: 300 [Urine:300]  Lab Results:  Recent Labs  05/09/16 1152 05/10/16 0443 05/11/16 0430  WBC 12.0* 8.2 7.1  HGB 12.2* 9.0* 8.9*  HCT 36.3* 27.2* 25.5*  PLT 181 143* 135*   BMET  Recent Labs  05/10/16 0443 05/11/16 0430 05/12/16 0432  NA 140 136 134*  K 3.5 3.5 3.6  CL 107 106 105  CO2 '26 25 26  '$ GLUCOSE 71 88 129*  BUN 7 6 <5*  CREATININE 0.63 0.57* 0.46*  CALCIUM 8.2* 7.8* 7.9*   LFT  Recent Labs  05/10/16 0443 05/11/16 0430 05/12/16 0432  PROT 5.6* 5.2* 5.6*  ALBUMIN 2.6* 2.4* 2.5*  AST 14* 15 19  ALT 11* 10* 10*  ALKPHOS 104 86 86  BILITOT 0.6 0.5 0.5   PT/INR  Recent Labs  05/10/16 1329  LABPROT 14.4  INR 1.12   Hepatitis Panel No  results for input(s): HEPBSAG, HCVAB, HEPAIGM, HEPBIGM in the last 72 hours.  Studies/Results: No results found. Scheduled Meds: . free water  100 mL Per Tube Q8H  . insulin aspart  0-9 Units Subcutaneous Q4H  . phenytoin (DILANTIN) IV  100 mg Intravenous Q8H  . sodium chloride flush  3 mL Intravenous Q12H   Continuous Infusions: . sodium chloride 50 mL/hr at 05/11/16 1828  . ampicillin-sulbactam (UNASYN) IV Stopped (05/12/16 0905)  . Marland KitchenTPN (CLINIMIX-E) Adult     And  . fat emulsion    . potassium chloride (KCL MULTIRUN) 30 mEq in 265 mL IVPB 30 mEq (05/12/16 1130)  . potassium phosphate IVPB (mmol) 20 mmol (05/12/16 1130)  . Marland KitchenTPN (CLINIMIX-E) Adult 20 mL/hr at 05/11/16 1743   PRN Meds:.albuterol, HYDROmorphone (DILAUDID) injection, ondansetron **OR** ondansetron (ZOFRAN) IV, sodium chloride flush  ASSESMENT:   *  Acute on chronic pancreatitis with phlegmon with possible abscess within phlegmon.  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  Day 4 abx, switched Meropenem to Unasyn (per hospitalist) 4/30.   Surgery (signed off) rec is IV abx, bowel rest, repeat CT in 3 to 4 weeks.   *  Severe protein  and calorie malnutrition.  BMI 15. Chronic indwelling GJ tube (original placement ~ 2010), dislodged and replaced with G tube 02/2016. Has not tolerated TF for at least 2 weeks. PEG site infection treated with abx in late 02/2016.  Now on TPN.    *  Hyperglycemia.  Hx of same.  No diabetes meds as outpt.  On SSI coverage now.   *  Left lung mass, left hilar adenopathy.  Stable c/w 04/06/16.  Concerning for malignancy per 04/28/16 CT scan.  Set for 05/20/16 office visit with Onc, Dr Earlie Server.   *  Normocytic anemia.  Hgb baseline ~ 12.5.  Hgb 12.2 >> 9.0 >> 8.9 over last 3 days.  No melena, no hematemesis.  FOBT negative 05/09/16.  PO iron on home med list.  Outpt colonoscopy set up for 05/22/16 in [place], Dr [name].  s/p 01/2011 colonoscopy ( 2 small polyps)  and EGD ( HH, PEJ/PEG in place).  2014 EGD (DR Britta Mccreedy.  For hematemesis: HH, mild gastritis and esophagitis, no active bleeding).  06/2013 EGD (for hematemesis. small HH, prominent gastric folds, PEG/PEJ in place): Dr [name].     PLAN   *  IR will attempt converting G to [place] tube today.  If succesful, can attempt tube feedings with goal to stop TPN if able to tolerate TF.    *  Once TF resumed, need to add back Creon, on home med list. 2 capsules AC at home but would give 2 capsules q 6 hours in setting of continuous tube feeds     Marcus Brooks  05/12/2016, 11:33 AM Pager: [phone]
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
## 2016-05-13 DIAGNOSIS — E46 Unspecified protein-calorie malnutrition: Secondary | ICD-10-CM

## 2016-05-13 DIAGNOSIS — I10 Essential (primary) hypertension: Secondary | ICD-10-CM

## 2016-05-13 DIAGNOSIS — F17200 Nicotine dependence, unspecified, uncomplicated: Secondary | ICD-10-CM

## 2016-05-13 DIAGNOSIS — G40909 Epilepsy, unspecified, not intractable, without status epilepticus: Secondary | ICD-10-CM

## 2016-05-13 DIAGNOSIS — K219 Gastro-esophageal reflux disease without esophagitis: Secondary | ICD-10-CM

## 2016-05-13 LAB — GLUCOSE, CAPILLARY
GLUCOSE-CAPILLARY: 103 mg/dL — AB (ref 65–99)
GLUCOSE-CAPILLARY: 103 mg/dL — AB (ref 65–99)
GLUCOSE-CAPILLARY: 83 mg/dL (ref 65–99)
Glucose-Capillary: 148 mg/dL — ABNORMAL HIGH (ref 65–99)
Glucose-Capillary: 158 mg/dL — ABNORMAL HIGH (ref 65–99)

## 2016-05-13 LAB — BASIC METABOLIC PANEL
ANION GAP: 5 (ref 5–15)
BUN: <5 mg/dL — ABNORMAL LOW (ref 6–20)
CO2: 26 mmol/L (ref 22–32)
Calcium: 8.4 mg/dL — ABNORMAL LOW (ref 8.9–10.3)
Chloride: 104 mmol/L (ref 101–111)
Creatinine, Ser: 0.49 mg/dL — ABNORMAL LOW (ref 0.61–1.24)
GFR calc Af Amer: >60 mL/min (ref >60)
GFR calc non Af Amer: >60 mL/min (ref >60)
GLUCOSE: 98 mg/dL (ref 65–99)
POTASSIUM: 3.9 mmol/L (ref 3.5–5.1)
SODIUM: 135 mmol/L (ref 135–145)

## 2016-05-13 LAB — PHOSPHORUS: PHOSPHORUS: 2.4 mg/dL — AB (ref 2.5–4.6)

## 2016-05-13 LAB — MAGNESIUM: Magnesium: 1.8 mg/dL (ref 1.7–2.4)

## 2016-05-13 MED ORDER — POTASSIUM PHOSPHATES 15 MMOLE/5ML IV SOLN
20.0000 mmol | Freq: Once | INTRAVENOUS | Status: AC
  Administered 2016-05-13: 20 mmol via INTRAVENOUS
  Filled 2016-05-13: qty 6.67

## 2016-05-13 MED ORDER — VITAL AF 1.2 CAL PO LIQD
1000.0000 mL | ORAL | Status: DC
  Administered 2016-05-13 – 2016-05-15 (×5): 1000 mL
  Filled 2016-05-13 (×5): qty 1000

## 2016-05-13 MED ORDER — FAT EMULSION 20 % IV EMUL
180.0000 mL | INTRAVENOUS | Status: AC
  Administered 2016-05-13: 180 mL via INTRAVENOUS
  Filled 2016-05-13: qty 250

## 2016-05-13 MED ORDER — SODIUM CHLORIDE 0.9 % IV SOLN
INTRAVENOUS | Status: DC
  Administered 2016-05-13 – 2016-05-15 (×2): via INTRAVENOUS

## 2016-05-13 MED ORDER — JEVITY 1.2 CAL PO LIQD: 1000.0000 mL | ORAL | Status: DC

## 2016-05-13 MED ORDER — POTASSIUM PHOSPHATES 15 MMOLE/5ML IV SOLN
20.0000 mmol | Freq: Once | INTRAVENOUS | Status: DC
  Filled 2016-05-13: qty 6.67

## 2016-05-13 MED ORDER — M.V.I. ADULT IV INJ
INJECTION | INTRAVENOUS | Status: AC
  Administered 2016-05-13: 17:00:00 via INTRAVENOUS
  Filled 2016-05-13: qty 960

## 2016-05-13 MED ORDER — MAGNESIUM SULFATE 2 GM/50ML IV SOLN
2.0000 g | Freq: Once | INTRAVENOUS | Status: AC
  Administered 2016-05-13: 2 g via INTRAVENOUS
  Filled 2016-05-13: qty 50

## 2016-05-13 NOTE — Progress Notes (Signed)
Pharmacy Antibiotic Note  Marcus Brooks is a 56 y.o. male admitted on 05/09/2016 with intra-abdominal infection.  Pharmacy has been consulted for Unasyn dosing. Patient previously on meropenem. No known history of ESBL infections.   Renal function remains stable. Plan is for 3 weeks of therapy per notes.  Plan: Continue Unasyn 1.5g IV q6h Follow renal function, clinical progression, any cultures  Height: '5\' 6"'$  (167.6 cm) Weight: 93 lb (42.2 kg) IBW/kg (Calculated) : 63.8  Temp (24hrs), Avg:98 F (36.7 C), Min:97.8 F (36.6 C), Max:98.2 F (36.8 C)   Recent Labs Lab 05/09/16 1152 05/09/16 1211 05/10/16 0443 05/11/16 0430 05/12/16 0432 05/13/16 0456  WBC 12.0*  --  8.2 7.1  --   --   CREATININE 0.79  --  0.63 0.57* 0.46* 0.49*  LATICACIDVEN  --  1.86  --   --   --   --     Estimated Creatinine Clearance: 62.3 mL/min (A) (by C-G formula based on SCr of 0.49 mg/dL (L)).    Allergies  Allergen Reactions  . Latex Rash    Antimicrobials this admission: Meropenem 4/28 >> 4/30 Unasyn 4/30 >>   Dose adjustments this admission: n/a  Microbiology results: No cultures drawn  Thank you for allowing pharmacy to be a part of this patient's care.  Joshua Soulier D. Nirvana Blanchett, PharmD, BCPS Clinical Pharmacist Pager: [phone] 05/13/2016 10:27 AM

## 2016-05-13 NOTE — Progress Notes (Signed)
   05/13/16 1300  Clinical Encounter Type  Visited With Patient;Health care provider  Visit Type Spiritual support  Referral From Nurse  Consult/Referral To Chaplain  Recommendations (Social work consult)  Stress Factors  Patient Stress Factors Family relationships;Health changes;Lack of knowledge    Chaplain followed up on [facility] consult with patient. Patient in distress regarding health condition, spiritual condition, family of origin, and spiritual journey. Has a desire to "get right with God." Patient has low level literacy and would benefit from social work consult for services. Spent time doing spiritual assessment. Patient has desire to be baptized. Chaplain will give information and references to churches in baptist denomination in the next couple of days. Provided ministry of presence, emotional support, and prayer.  Meka Lewan L. Volanda Napoleon, [place]

## 2016-05-13 NOTE — Progress Notes (Signed)
Nutrition Follow-up  DOCUMENTATION CODES:   Severe malnutrition in context of chronic illness, Underweight  INTERVENTION:   -Tube Feeding: recommend starting Vital AF 1.2 at rate of 20 ml/hr today. Goal rate of 55 ml/hr provides 1584 kcals, 99 g of protein, 1069 mL of free water. Meets 100% calorie needs, >100% protein needs. Once off IV fluids, recommend free water flush of 200 mL q 8 hours. Tube maintenance free water flushes at present (100 mL q 8 hours)  -Continue to monitor electrolytes  -Continue TPN per Pharmacy  -Clinimix TPN 5%AA/15%Dextrose at 40 mL/hr with 20% ILE at rate of 15 ml/hr for 12 hours to provide 1046 kcals and 48g protein and 1140 mL of fluid in 24 hours. Meets 69% low end of estimated protein needs and 72% low end of calorie needs.     NUTRITION DIAGNOSIS:   Malnutrition (Severe) related to chronic illness (chronic pancreatitis, EtOH abuse, lung mass) as evidenced by severe depletion of body fat, severe depletion of muscle mass.  Continues but being addressed via nutrition support  GOAL:   Patient will meet greater than or equal to 90% of their needs  Progressing  MONITOR:   Labs, Weight trends, I & O's (TPN tolerance, transition to TF)  REASON FOR ASSESSMENT:   Consult New TPN/TNA  ASSESSMENT:   56 yo male admitted with N/V with acute on chronic pancreatitis.  Pt with hx of EtOH abuse, seizures, chronic pancreatitis, lung mass, feeding tube  Pt refused conversion of G tube to G-J tube Pt with hx of chronic indwelling G-J tube (original placement 2010), dislodged and replaced with G-tube (02/2016).  No N/V, no abdominal pain, requesting jello  Spoke with Azucena Freed PA., plan to start low rate TF via G-tube today, allowing CL diet as well Free water flush of 100 mL q 8 hours ordered   5%AA/15%Dextrose infusing at rate of 30 ml/hr via PICC line, 20% ILE at rate of 10 ml/hr for 12 hours  Labs: phopshorus 2.4 (supplemented) Meds:  reviewed  Diet Order:  TPN (CLINIMIX-E) Adult TPN (CLINIMIX-E) Adult Diet clear liquid Room service appropriate? Yes; Fluid consistency: Thin  Skin:  Reviewed, no issues  Last BM:  05/11/16  Height:   Ht Readings from Last 1 Encounters:  05/12/16 '5\' 6"'$  (1.676 m)    Weight:   Wt Readings from Last 1 Encounters:  05/12/16 93 lb (42.2 kg)   Filed Weights   05/10/16 2052 05/11/16 0152 05/12/16 2043  Weight: 95 lb 6.4 oz (43.3 kg) 95 lb 7.4 oz (43.3 kg) 93 lb (42.2 kg)    Ideal Body Weight:  64.5 kg  BMI:  Body mass index is 15.01 kg/m.  Estimated Nutritional Needs:   Kcal:  1450-1680 kcals  Protein:  70-85 g  Fluid:  >/= 1.5 L  EDUCATION NEEDS:   No education needs identified at this time  [place], [place], LDN [phone] Pager  [phone] Weekend/On-Call Pager

## 2016-05-13 NOTE — Care Management Note (Signed)
Case Management Note  Patient Details  Name: GIBRIL MASTRO MRN: [id number] Date of Birth: 1961/01/07  Subjective/Objective:      CM following for progression and d/c planning.               Action/Plan: 05/13/2016 Pt on tube feeding prior to admission , on TPN now .  Has hilar mass. D/c plan? Depending on needs.   Expected Discharge Date:                  Expected Discharge Plan:  [place]  In-House Referral:  NA  Discharge planning Services  CM Consult  Post Acute Care Choice:  Home Health, Durable Medical Equipment Choice offered to:  Patient  DME Arranged:   (Tube feedings) DME Agency:     HH Arranged:  RN [place] Agency:     Status of Service:  In process, will continue to follow  If discussed at Long Length of Stay Meetings, dates discussed:    Additional Comments:  Adron Bene, RN 05/13/2016, 2:48 PM

## 2016-05-13 NOTE — Progress Notes (Signed)
PHARMACY - ADULT TOTAL PARENTERAL NUTRITION CONSULT NOTE   Pharmacy Consult for TPN Indication: Calcific Pancreatitis / pancreatic phlegmon  Patient Measurements: Height: '5\' 6"'$  (167.6 cm) Weight: 93 lb (42.2 kg) IBW/kg (Calculated) : 63.8 TPN AdjBW (KG): 42.2 Body mass index is 15.01 kg/m.  Assessment:  56 yo M presents on 4/28 with abdominal pain and persistent nausea and vomiting. 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 Also has severe protein-calorie malnutrition and unintentional weight loss. Denies any alcohol for over one year. CT in ED shows phlegmonous mass and GI worried there is necrosis. Plan to treat supportively with TPN and abx for ~4 weeks, then if fluid collection persists he may need endoscopic drainage at [place] consulted to start TPN. At risk for refeeding syndrome.  GI: Acute on chronic pancreatitis with severe protein-calorie malnutrition, at risk for refeeding syndrome. Constant abd pain but improved, (-)nausea & last vomited 4/29, (+)bm's.  Abd soft, tender.  4/28 CT shows acute inflammation in the lesser sac region and a phlegmonous mass (7cm) GI following.  Radiology consult- fluid collections not ideal for perc drainage. rec repeat CT 3-4 wks.  Considering conversion of GT to [place] tube for post-pyloric feeding.  Surgery signed off 4/30. Patient refused GJ tube on 5/1.  Albumin low at 2.5. TPN was not charted as given on 5/1- Spoke with nurse and TPN is running at 30 ml/hr today. Endo: No hx of DM. CBGs spiked slightly to 178 when new TPN initiated yesterday PM.(80s-170s) Insulin requirements in the past 24 hours: 3 units Lytes:  K 3.9 stable, Mg 1.8, Phos up slightly to 2.4. CoCa 9.4. Gave 30 meq K, 20 mmol of potassium phosphate on 5/1. Renal: SCr stable, CrCl ~76m/min. UOP 1.3 ml/kg/hr yesterday. On NS at 589mhr Pulm: RA.  Lung mass, considering w/u while in hospital Cards: BP and HR ok. Hepatobil: Tbili wnl, LFTs ok.  TG 55 Neuro:   Phenytoin '100mg'$  IV q8 for hx seizures, corrected level 4.8 on 4/29.  Also on hydromorphone prn severe pain ID: On Unasyn for pancreatitis and pancreatic phlegmon. Plan to treat for 3 weeks. Afebrile, WBC down to wnl.  Best Practices: SCDs TPN Access: PICC 4/29 TPN start date: 4/29 >>  Nutritional Goals (per RD recommendation on 4/30): kCal: [phone]  Protein: 70-85 g   Current Nutrition:  NPO Clinimix E 5/15 + IV lipid emulsions  Plan:  Increase Clinimix E 5/15 to 40 ml/hr Increase 20% lipid emulsion to 15 ml/hr over 12 hrs Decrease NS to  4068mr, decreasing as able to advance TPN TPN and IV lipid emulsion provides 48 g of protein and 1042 kCals. Will advance TPN slowly to goal Add MVI in TPN Add TE in TPN every other day, 5/2 Add thiamine '100mg'$  and folic acid '1mg'$  to TPN Continue sensitive SSI tonight and adjust as needed Monitor TPN labs, Cmet, Mg, and Phos tomorrow  Watch CBGs closely  Give potassium phosphate 20 mMol IV X 1 today (will provide ~48m35mf K) Give Mg 2g IV X 1   EmilUvaldo BristlearmD PGY1 Pharmacy Resident Pager [phone] 7:33 AM

## 2016-05-13 NOTE — Progress Notes (Signed)
Daily Rounding Note  05/13/2016, 8:14 AM  LOS: 3 days   SUBJECTIVE:   Chief complaint:  Abdominal pain gone.  No n/v.  Wants jello Refused conversion of G to GJ tube.    OBJECTIVE:         Vital signs in last 24 hours:    Temp:  [97.8 F (36.6 C)-98.6 F (37 C)] 98 F (36.7 C) (05/02 0555) Pulse Rate:  [53-63] 60 (05/02 0555) Resp:  [16-18] 18 (05/02 0555) BP: (115-141)/(69-75) 141/74 (05/02 0555) SpO2:  [98 %-100 %] 98 % (05/02 0555) Weight:  [42.2 kg (93 lb)] 42.2 kg (93 lb) (05/01 2043) Last BM Date: 05/11/16 Filed Weights   05/10/16 2052 05/11/16 0152 05/12/16 2043  Weight: 43.3 kg (95 lb 6.4 oz) 43.3 kg (95 lb 7.4 oz) 42.2 kg (93 lb)   General: looks better but still malnourished and pale.  comfortable   Heart: RRR Chest: clear bil.  No dyspnea or cough.   Abdomen: soft, NT.  PEG site benign.  Extremities: no CCE Neuro/Psych:  Calm, cooperative, oriented x 3.    Intake/Output from previous day: 05/01 0701 - 05/02 0700 In: 10 [I.V.:10] Out: 1325 [Urine:1325]  Intake/Output this shift: No intake/output data recorded.  Lab Results:  Recent Labs  05/11/16 0430  WBC 7.1  HGB 8.9*  HCT 25.5*  PLT 135*   BMET  Recent Labs  05/11/16 0430 05/12/16 0432 05/13/16 0456  NA 136 134* 135  K 3.5 3.6 3.9  CL 106 105 104  CO2 '25 26 26  '$ GLUCOSE 88 129* 98  BUN 6 <5* <5*  CREATININE 0.57* 0.46* 0.49*  CALCIUM 7.8* 7.9* 8.4*   LFT  Recent Labs  05/11/16 0430 05/12/16 0432  PROT 5.2* 5.6*  ALBUMIN 2.4* 2.5*  AST 15 19  ALT 10* 10*  ALKPHOS 86 86  BILITOT 0.5 0.5   PT/INR  Recent Labs  05/10/16 1329  LABPROT 14.4  INR 1.12   Hepatitis Panel No results for input(s): HEPBSAG, HCVAB, HEPAIGM, HEPBIGM in the last 72 hours.  Studies/Results: No results found.   Scheduled Meds: . free water  100 mL Per Tube Q8H  . insulin aspart  0-9 Units Subcutaneous Q4H  . phenytoin (DILANTIN) IV   100 mg Intravenous Q8H  . sodium chloride flush  3 mL Intravenous Q12H   Continuous Infusions: . sodium chloride 50 mL/hr at 05/12/16 1626  . ampicillin-sulbactam (UNASYN) IV Stopped (05/13/16 0414)  . Mar[place] KitchenTPN (CLINIMIX-E) Adult     And  . fat emulsion    . magnesium sulfate 1 - 4 g bolus IVPB    . potassium phosphate IVPB (mmol)    . Marland KitchenTPN (CLINIMIX-E) Adult     PRN Meds:.albuterol, HYDROmorphone (DILAUDID) injection, ondansetron **OR** ondansetron (ZOFRAN) IV, sodium chloride flush   ASSESMENT:   * Acute on chronic pancreatitis with phlegmon, possible abscess within phlegmon. Past etiology of pancreatitis (originally due to ETOH) with pseudocysts.  Day 5 abx, switched Meropenem to Unasyn (per hospitalist) 4/30.  Surgery (signed off) rec is IV abx, bowel rest, repeat CT in 3 to 4 weeks.  He appears to have turned a corner today, pain gone, no n/v and asking for jello.   * Severe protein and calorie malnutrition. BMI 15. Chronic indwelling GJ tube (original placement ~ 2010), dislodged and replaced with G tube 02/2016. Tolerated low rate TF only for at least 2 weeks. PEG site infection treated with abx in  late 02/2016.  Refused conversion G to GJ tube.   Now on TPN.    *  Hyperglycemia.  Hx of same.  No diabetes meds as outpt.  On SSI coverage now.   * Left lung mass, left hilar adenopathy. Stable c/w 04/06/16. Concerning for malignancy per 04/28/16 CT scan. Set for 05/20/16 office visit with Onc, Dr Earlie Server.   * Normocytic anemia. Hgb baseline ~ 12.5. Hgb 12.2 >>9.0 >>8.9. No melena, no hematemesis. FOBT negative 05/09/16.  PO iron on home med list.  Outpt colonoscopy set up for 05/22/16 in [place], Dr [name].  s/p 01/2011 colonoscopy (2 small polyps, 1 tubular adenoma, the 2nd polyp was lost) and EGD ( HH, PEJ/PEG in place). 2014 EGD (Dr Britta Mccreedy. For hematemesis: HH, mild gastritis and esophagitis, no active bleeding). 06/2013 EGD (for hematemesis. small HH, prominent  gastric folds, PEG/PEJ in place): Dr [name].     PLAN   *  Try to persuade pt to undergo feeding tube conversion which would allow for enteric feeding and for discontinuation of TNA.  ? possiblity of "deep sedation for this?   In meantime trial low rate Tube feeds which pt says he tolerated at home just PTA.   Will ask RD to make TF formula recs.   Allow clear liquids now.    *  3 weeks Unasyn, per ID curbside consult repeat CT just prior to discontinuation of ABX. Has Dr [name] in [place] for GI follow up but could also follow up with [facility] (pt has followed with them before).         Azucena Freed  05/13/2016, 8:14 AM Pager: [phone]

## 2016-05-13 NOTE — Progress Notes (Signed)
PROGRESS NOTE    Marcus Brooks  KXF:[id number] DOB: 1960/05/17 DOA: 05/09/2016 PCP: [name], FNP    Brief Narrative:  Patient is a 56 year old gentleman history of chronic pancreatitis initially secondary to alcohol abuse presented with a one-month history of significant weight loss of 50 pounds, malaise, abdominal pain, poor oral intake worsened over the past month. CT abdomen and pelvis which was done concerning for a walled off fluid collection anterior and superior to the pancreas with large amount of edema. GI and general surgery were consulted. PICC line placed and patient started on TPN. Patient also now started on tube feeds and clear liquids. No surgical intervention at this time. Patient placed empirically on IV antibiotics.   Assessment & Plan:   Principal Problem:   Acute on chronic pancreatitis (HCC) Active Problems:   Hypertension   GERD (gastroesophageal reflux disease)   Hematemesis   Seizure disorder (HCC)   Protein-calorie malnutrition, severe (HCC)   Chronic pain syndrome   Tobacco use disorder   Coronary artery disease   Atherosclerosis of aorta (HCC)   Phlegmon of pancreas   Lymphadenopathy   Malnutrition ([place])  #1 acute on chronic pancreatitis with phlegmon Patient with a prior history of pancreatitis initially secondary to our, with pseudocysts. Patient with clinical improvement. Patient currently afebrile. Patient tolerating clear liquids. Patient getting tube feeds from the G-tube. Patient also on TPN. Patient was on IV Merrem and has been transitioned to IV Unasyn. Per GI curbside with ID recommending that patient likely requires 3 weeks of IV antibiotics/Unasyn. Patient will likely need repeat CT abdomen and pelvis just prior to antibiotics discontinuation approximately 3 weeks. Per nursing patient refused conversion of G-tube to [place] tube. Continue supportive care. General surgery and GI following.  #2 gastroesophageal reflux disease PPI.  #3  seizure disorder Stable. Continue Dilantin.  #4 severe protein calorie malnutrition Patient receiving tube feeds from the G-tube. Patient also on TPN. Follow.  #5 left lung mass/left hilar adenopathy Concerning for malignancy. CT scan chest of 04/28/2016. Patient scheduled for office visit with oncology Dr. Earlie Server on 05/20/2016.  #6 anemia Stable. Follow H&H.    DVT prophylaxis: SCDs Code Status: Full Family Communication: Updated patient. No family at bedside. Disposition Plan: Home versus SNF when medically stable.   Consultants:   General surgery Dr. Alvino Blood 05/10/2016  Gastroenterology: Dr.Hung 05/09/2016  Procedures:   CT abdomen and pelvis 05/09/2016  Chest x-ray 05/09/2016    Antimicrobials:   IV Unasyn 05/11/2016  IV Merrem 05/09/2016>>>>> 05/11/2016.   Subjective: Patient denies any chest pain. No shortness of breath. Patient states improvement with abdominal pain. Patient with no nausea or emesis. Patient tolerating clear liquids.  Objective: Vitals:   05/13/16 0555 05/13/16 0946 05/13/16 1359 05/13/16 1725  BP: (!) 141/74 (!) 110/59 (!) 97/59 104/62  Pulse: 60 (!) 58 60 64  Resp: '18 16 16 18  '$ Temp: 98 F (36.7 C) 98.2 F (36.8 C) 97.8 F (36.6 C) 98 F (36.7 C)  TempSrc: Oral Oral Oral Oral  SpO2: 98% 100% 100% 100%  Weight:      Height:        Intake/Output Summary (Last 24 hours) at 05/13/16 1800 Last data filed at 05/13/16 1728  Gross per 24 hour  Intake              730 ml  Output             1125 ml  Net             -  395 ml   Filed Weights   05/10/16 2052 05/11/16 0152 05/12/16 2043  Weight: 43.3 kg (95 lb 6.4 oz) 43.3 kg (95 lb 7.4 oz) 42.2 kg (93 lb)    Examination:  General exam: Appears calm and comfortable.Cachectic. Chronically ill-looking.  Respiratory system: Clear to auscultation. Respiratory effort normal. Cardiovascular system: S1 & S2 heard, RRR. No JVD, murmurs, rubs, gallops or clicks. No pedal  edema. Gastrointestinal system: Abdomen is nondistended, soft and nontender. No organomegaly or masses felt. Normal bowel sounds heard. PEG tube in place. Central nervous system: Alert and oriented. No focal neurological deficits. Extremities: Symmetric 5 x 5 power. Skin: No rashes, lesions or ulcers Psychiatry: Judgement and insight appear normal. Mood & affect appropriate.     Data Reviewed: I have personally reviewed following labs and imaging studies  CBC:  Recent Labs Lab 05/09/16 1152 05/10/16 0443 05/11/16 0430  WBC 12.0* 8.2 7.1  NEUTROABS 9.4*  --  5.0  HGB 12.2* 9.0* 8.9*  HCT 36.3* 27.2* 25.5*  MCV 94.8 95.8 94.8  PLT 181 143* 149*   Basic Metabolic Panel:  Recent Labs Lab 05/09/16 1152 05/10/16 0443 05/10/16 0907 05/11/16 0430 05/12/16 0432 05/13/16 0456  NA 138 140  --  136 134* 135  K 3.9 3.5  --  3.5 3.6 3.9  CL 101 107  --  106 105 104  CO2 26 26  --  '25 26 26  '$ GLUCOSE 112* 71  --  88 129* 98  BUN 17 7  --  6 <5* <5*  CREATININE 0.79 0.63  --  0.57* 0.46* 0.49*  CALCIUM 8.9 8.2*  --  7.8* 7.9* 8.4*  MG  --   --  1.7 1.7  --  1.8  PHOS  --   --  2.8 1.7* 1.9* 2.4*   GFR: Estimated Creatinine Clearance: 62.3 mL/min (A) (by C-G formula based on SCr of 0.49 mg/dL (L)). Liver Function Tests:  Recent Labs Lab 05/09/16 1152 05/10/16 0443 05/11/16 0430 05/12/16 0432  AST 24 14* 15 19  ALT 13* 11* 10* 10*  ALKPHOS 143* 104 86 86  BILITOT 0.7 0.6 0.5 0.5  PROT 7.5 5.6* 5.2* 5.6*  ALBUMIN 3.6 2.6* 2.4* 2.5*    Recent Labs Lab 05/09/16 1152 05/10/16 0443  LIPASE 63* 43   No results for input(s): AMMONIA in the last 168 hours. Coagulation Profile:  Recent Labs Lab 05/10/16 1329  INR 1.12   Cardiac Enzymes: No results for input(s): CKTOTAL, CKMB, CKMBINDEX, TROPONINI in the last 168 hours. BNP (last 3 results) No results for input(s): PROBNP in the last 8760 hours. HbA1C: No results for input(s): HGBA1C in the last 72  hours. CBG:  Recent Labs Lab 05/12/16 2102 05/13/16 0007 05/13/16 0723 05/13/16 1129 05/13/16 1628  GLUCAP 178* 83 103* 148* 158*   Lipid Profile:  Recent Labs  05/11/16 0430  TRIG 55   Thyroid Function Tests: No results for input(s): TSH, T4TOTAL, FREET4, T3FREE, THYROIDAB in the last 72 hours. Anemia Panel: No results for input(s): VITAMINB12, FOLATE, FERRITIN, TIBC, IRON, RETICCTPCT in the last 72 hours. Sepsis Labs:  Recent Labs Lab 05/09/16 1211  LATICACIDVEN 1.86    No results found for this or any previous visit (from the past 240 hour(s)).       Radiology Studies: No results found.      Scheduled Meds: . feeding supplement (VITAL AF 1.2 CAL)  1,000 mL Per Tube Q24H  . free water  100 mL Per Tube  Q8H  . insulin aspart  0-9 Units Subcutaneous Q4H  . phenytoin (DILANTIN) IV  100 mg Intravenous Q8H  . sodium chloride flush  3 mL Intravenous Q12H   Continuous Infusions: . sodium chloride    . ampicillin-sulbactam (UNASYN) IV Stopped (05/13/16 0913)  . Marland KitchenTPN (CLINIMIX-E) Adult 40 mL/hr at 05/13/16 1723   And  . fat emulsion 180 mL (05/13/16 1723)  . potassium phosphate IVPB (mmol) 20 mmol (05/13/16 1305)     LOS: 3 days    Time spent: 47 minutes    Christino Mcglinchey, MD [facility] Pager [phone]  If 7PM-7AM, please contact night-coverage [email or web address] Password [facility] 05/13/2016, 6:00 PM ]

## 2016-05-14 ENCOUNTER — Other Ambulatory Visit: Payer: Self-pay | Admitting: Family Medicine

## 2016-05-14 DIAGNOSIS — G894 Chronic pain syndrome: Secondary | ICD-10-CM

## 2016-05-14 DIAGNOSIS — K869 Disease of pancreas, unspecified: Secondary | ICD-10-CM

## 2016-05-14 LAB — COMPREHENSIVE METABOLIC PANEL
ALBUMIN: 2.5 g/dL — AB (ref 3.5–5.0)
ALK PHOS: 80 U/L (ref 38–126)
ALT: 11 U/L — ABNORMAL LOW (ref 17–63)
ANION GAP: 4 — AB (ref 5–15)
AST: 17 U/L (ref 15–41)
BUN: 5 mg/dL — ABNORMAL LOW (ref 6–20)
CALCIUM: 8.2 mg/dL — AB (ref 8.9–10.3)
CHLORIDE: 100 mmol/L — AB (ref 101–111)
CO2: 29 mmol/L (ref 22–32)
Creatinine, Ser: 0.46 mg/dL — ABNORMAL LOW (ref 0.61–1.24)
GFR calc non Af Amer: >60 mL/min (ref >60)
GLUCOSE: 124 mg/dL — AB (ref 65–99)
POTASSIUM: 4.2 mmol/L (ref 3.5–5.1)
SODIUM: 133 mmol/L — AB (ref 135–145)
Total Bilirubin: 0.1 mg/dL — ABNORMAL LOW (ref 0.3–1.2)
Total Protein: 5.4 g/dL — ABNORMAL LOW (ref 6.5–8.1)

## 2016-05-14 LAB — PHOSPHORUS: PHOSPHORUS: 2.8 mg/dL (ref 2.5–4.6)

## 2016-05-14 LAB — GLUCOSE, CAPILLARY
GLUCOSE-CAPILLARY: 136 mg/dL — AB (ref 65–99)
Glucose-Capillary: 123 mg/dL — ABNORMAL HIGH (ref 65–99)
Glucose-Capillary: 147 mg/dL — ABNORMAL HIGH (ref 65–99)
Glucose-Capillary: 149 mg/dL — ABNORMAL HIGH (ref 65–99)
Glucose-Capillary: 94 mg/dL (ref 65–99)
Glucose-Capillary: 97 mg/dL (ref 65–99)

## 2016-05-14 LAB — CBC
HEMATOCRIT: 25.8 % — AB (ref 39.0–52.0)
HEMOGLOBIN: 8.6 g/dL — AB (ref 13.0–17.0)
MCH: 31.3 pg (ref 26.0–34.0)
MCHC: 33.3 g/dL (ref 30.0–36.0)
MCV: 93.8 fL (ref 78.0–100.0)
Platelets: 137 10*3/uL — ABNORMAL LOW (ref 150–400)
RBC: 2.75 MIL/uL — AB (ref 4.22–5.81)
RDW: 14.1 % (ref 11.5–15.5)
WBC: 7.9 10*3/uL (ref 4.0–10.5)

## 2016-05-14 LAB — MAGNESIUM: Magnesium: 1.8 mg/dL (ref 1.7–2.4)

## 2016-05-14 MED ORDER — DEXTROSE 5 % IV SOLN
20.0000 mmol | Freq: Once | INTRAVENOUS | Status: DC
  Filled 2016-05-14: qty 6.67

## 2016-05-14 MED ORDER — SODIUM PHOSPHATES 45 MMOLE/15ML IV SOLN
20.0000 mmol | Freq: Once | INTRAVENOUS | Status: AC
  Administered 2016-05-14: 20 mmol via INTRAVENOUS
  Filled 2016-05-14: qty 6.67

## 2016-05-14 MED ORDER — INSULIN ASPART 100 UNIT/ML ~~LOC~~ SOLN
0.0000 [IU] | Freq: Three times a day (TID) | SUBCUTANEOUS | Status: DC
  Administered 2016-05-14 (×2): 1 [IU] via SUBCUTANEOUS

## 2016-05-14 MED ORDER — SODIUM CHLORIDE 0.9 % IV SOLN
1.5000 g | Freq: Three times a day (TID) | INTRAVENOUS | Status: DC
  Administered 2016-05-14 – 2016-05-15 (×3): 1.5 g via INTRAVENOUS
  Filled 2016-05-14 (×4): qty 1.5

## 2016-05-14 MED ORDER — MAGNESIUM SULFATE 50 % IJ SOLN
3.0000 g | Freq: Once | INTRAVENOUS | Status: AC
  Administered 2016-05-14: 3 g via INTRAVENOUS
  Filled 2016-05-14: qty 6

## 2016-05-14 NOTE — Progress Notes (Signed)
PHARMACY CONSULT NOTE FOR:  OUTPATIENT  PARENTERAL ANTIBIOTIC THERAPY (OPAT)  Indication: acute on chronic pancreatitis with phlegmon Regimen: Unasyn 1.5g IV q8h to ease with outpatient dosing- also changed inpatient order End date: 05/30/2016   IV antibiotic discharge orders are pended. To discharging provider:  please sign these orders via discharge navigator,  Select New Orders & click on the button choice - Manage This Unsigned Work.     Thank you for allowing pharmacy to be a part of this patient's care.   Gurjot Brisco 05/14/2016, 1:12 PM

## 2016-05-14 NOTE — Evaluation (Signed)
Physical Therapy Evaluation Patient Details Name: Marcus Brooks MRN: [id number] DOB: 1960/06/20 Today's Date: 05/14/2016   History of Present Illness  Pt is a 56 yo male admitted through the ED with acute on chornic panceatitis, hematemesis, phlegm of the pancrease. PMH significant for chronic pancreatitis, htn, lung malignancy with chronic pain, siezure d/o, chronic pain, and feeding tube placement in 2012.   Clinical Impression  Pt presents with the above diagnosis for therapy evaluation. Prior to admission, pt lived with his fiance in a two story home and was able to negotiate his home without any assistance. Pt is able to mobilize this session without any therapist assistance and has been maneuvering from his bed to recliner without an AD. Pt's main concern his going home with his PEG equipment ASAP. Pt reports no changes in mobility status nor any change in strength or falls prior to admission. No skilled PT is required at this time and all education was complete this session. Please re-order if anything changes.     Follow Up Recommendations No PT follow up    Equipment Recommendations       Recommendations for Other Services       Precautions / Restrictions Precautions Precautions: None Precaution Comments: feeding tube Restrictions Weight Bearing Restrictions: No      Mobility  Bed Mobility Overal bed mobility: Independent             General bed mobility comments: sitting EOB when PT arrives  Transfers Overall transfer level: Modified independent Equipment used: None             General transfer comment: able to stand and sit from EOB without assistance.   Ambulation/Gait Ambulation/Gait assistance: Supervision Ambulation Distance (Feet): 10 Feet Assistive device: None Gait Pattern/deviations: Step-through pattern Gait velocity: decreased Gait velocity interpretation: Below normal speed for age/gender General Gait Details: able to ambulate a short  distance in the room without assistance. Pt with noted fatigue due to lack of nutrition.  Stairs            Wheelchair Mobility    Modified Rankin (Stroke Patients Only)       Balance Overall balance assessment: No apparent balance deficits (not formally assessed)                                           Pertinent Vitals/Pain Pain Assessment: Faces Faces Pain Scale: Hurts a little bit Pain Location: abdomen Pain Descriptors / Indicators: Grimacing Pain Intervention(s): Monitored during session;Premedicated before session    Home Living Family/patient expects to be discharged to:: Private residence Living Arrangements: Spouse/significant other;Other (Comment) (fiance) Available Help at Discharge: Family;Available PRN/intermittently Type of Home: House Home Access: Stairs to enter     Home Layout: Two level;Bed/bath upstairs Home Equipment: Walker - 2 wheels;Bedside commode      Prior Function Level of Independence: Independent         Comments: reports being independent, moving without an AD.      Hand Dominance   Dominant Hand: Right    Extremity/Trunk Assessment   Upper Extremity Assessment Upper Extremity Assessment: Defer to OT evaluation    Lower Extremity Assessment Lower Extremity Assessment: Generalized weakness    Cervical / Trunk Assessment Cervical / Trunk Assessment: Normal  Communication   Communication: No difficulties  Cognition Arousal/Alertness: Awake/alert Behavior During Therapy: WFL for tasks assessed/performed Overall Cognitive Status: Within  Functional Limits for tasks assessed                                        General Comments General comments (skin integrity, edema, etc.): pt is anxious about discharging home. Reports he needs to pay his rent and has no one there to help him.     Exercises     Assessment/Plan    PT Assessment Patent does not need any further PT services  PT  Problem List         PT Treatment Interventions      PT Goals (Current goals can be found in the Care Plan section)       Frequency     Barriers to discharge        Co-evaluation               AM-PAC PT "6 Clicks" Daily Activity  Outcome Measure Difficulty turning over in bed (including adjusting bedclothes, sheets and blankets)?: None Difficulty moving from lying on back to sitting on the side of the bed? : None Difficulty sitting down on and standing up from a chair with arms (e.g., wheelchair, bedside commode, etc,.)?: None Help needed moving to and from a bed to chair (including a wheelchair)?: None Help needed walking in hospital room?: None Help needed climbing 3-5 steps with a railing? : A Little 6 Click Score: 23    End of Session   Activity Tolerance: Patient tolerated treatment well;Patient limited by fatigue Patient left: in bed;with call bell/phone within reach Nurse Communication: Mobility status PT Visit Diagnosis: Muscle weakness (generalized) (M62.81)    Time: 1102-1117 PT Time Calculation (min) (ACUTE ONLY): 8 min   Charges:   PT Evaluation $PT Eval Low Complexity: 1 Procedure     PT G Codes:        Scheryl Marten PT, DPT  [phone]   Shanon Rosser 05/14/2016, 12:08 PM

## 2016-05-14 NOTE — Progress Notes (Signed)
PHARMACY - ADULT TOTAL PARENTERAL NUTRITION CONSULT NOTE   Pharmacy Consult for TPN Indication: Calcific Pancreatitis / pancreatic phlegmon  Patient Measurements: Height: '5\' 6"'$  (167.6 cm) Weight: 93 lb (42.2 kg) IBW/kg (Calculated) : 63.8 TPN AdjBW (KG): 42.2 Body mass index is 15.01 kg/m.  Assessment:  56 yo M presents on 4/28 with abdominal pain and persistent nausea and vomiting. 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 Also has severe protein-calorie malnutrition and unintentional weight loss. Denies any alcohol for over one year. CT in ED shows phlegmonous mass and GI worried there is necrosis. Plan to treat supportively with TPN and abx for ~4 weeks, then if fluid collection persists he may need endoscopic drainage at [place] consulted to start TPN. At risk for refeeding syndrome.  GI: Acute on chronic pancreatitis with severe protein-calorie malnutrition, at risk for refeeding syndrome. Constant abd pain but improved, (-)nausea & last vomited 4/29, (+)bm's.  Abd soft, tender.  4/28 CT shows acute inflammation in the lesser sac region and a phlegmonous mass (7cm) GI following.  Radiology consult- fluid collections not ideal for perc drainage. rec repeat CT 3-4 wks.  Considering conversion of GT to [place] tube for post-pyloric feeding.  Surgery signed off 4/30. Patient refused GJ tube on 5/1.  Albumin low at 2.5. Vital AF 1.2 started at 40m/hr via G tube to increase to a goal rate of 55 ml/hr to provide 1583 kcal, 99 g of protein.   Endo: No hx of DM. CBGs trending up since TPN initiated.(90s-150s) Insulin requirements in the past 24 hours: 4.5 units Lytes: Na down to 133  K up to 4.2 , Mg 1.8, Phos up to 2.8. CoCa 9.2. Gave 2 gm Mg , 20 mmol of potassium phosphate on 5/2.  Renal: SCr stable, CrCl ~688mmin. UOP 1.4 ml/kg/hr yesterday. On NS at 4038mr and free water 100 mL every 8 hours  Pulm: RA.  Lung mass, considering w/u while in hospital Cards: BP and HR  ok. Hepatobil: Tbili low at 0.1, LFTs ok.  TG 55 Neuro:  Phenytoin '100mg'$  IV q8 for hx seizures, corrected level 4.8 on 4/29.  Also on hydromorphone prn severe pain ID: On Unasyn for pancreatitis and pancreatic phlegmon. Plan to treat for 3 weeks. Afebrile, WBC down to wnl.  Best Practices: SCDs TPN Access: PICC 4/29 TPN start date: 4/29 >>5/3  Nutritional Goals (per RD recommendation on 4/30): kCal: [phone]: 70-85 g   Current Nutrition:  NPO Clinimix E 5/15 + IV lipid emulsions Vital AF 1.2 Cal @ 50 ml/hr  Plan:  Vital AF 1.2 Cal increased to 50 ml/hr early this morning (goal: 55 ml/hr) Continue Clinimix E 5/15 at 40 ml/hr for today- Will wean off  TPN today Continue  NS  at 40m33m Will D/C TPN labs and orders.  Pharmacy will sign-off. Available as needed.   Give Mg 3g IV X 1  Give sodium phosphate 20 mmol X1 tonight   EmilUvaldo BristlearmD PGY1 Pharmacy Resident Pager [phone] 7:32 AM

## 2016-05-14 NOTE — Progress Notes (Signed)
Occupational Therapy Evaluation Patient Details Name: Marcus Brooks MRN: [id number] DOB: 1960/11/26 Today's Date: 05/14/2016    History of Present Illness Pt is a 56 yo male admitted through the ED with acute on chornic panceatitis, hematemesis, phlegm of the pancrease. PMH significant for chronic pancreatitis, htn, lung malignancy with chronic pain, siezure d/o, chronic pain, and feeding tube placement in 2012.    Clinical Impression   PTA, pt lived at home with his roommate and was modified independent with ADL and mobility using rollator as needed. Pt fatigues easily with ADL.Recommend BSC and tub bench for safe DC home. Will plan to follow up in am to complete education regarding energy conservation, use of DME and AE to reduce risk of falls. Pt reported to this therapist that he has bed bugs at home. Nsg made aware    Follow Up Recommendations  No OT follow up;Supervision - Intermittent    Equipment Recommendations  3 in 1 bedside commode;Tub/shower bench    Recommendations for Other Services       Precautions / Restrictions Precautions Precautions: Fall Precaution Comments: feeding tube Restrictions Weight Bearing Restrictions: No      Mobility Bed Mobility Overal bed mobility: Independent             General bed mobility comments: sitting EOB   Transfers Overall transfer level: Modified independent Equipment used: None             General transfer comment: able to stand and sit from EOB without assistance.     Balance Overall balance assessment: No apparent balance deficits (not formally assessed)                                         ADL either performed or assessed with clinical judgement   ADL Overall ADL's : Needs assistance/impaired     Grooming: Modified independent   Upper Body Bathing: Set up;Supervision/ safety   Lower Body Bathing: Set up;Supervison/ safety;Sit to/from stand   Upper Body Dressing : Set up   Lower  Body Dressing: Set up;Supervision/safety;Sit to/from stand   Toilet Transfer: Min guard;Ambulation   Toileting- Clothing Manipulation and Hygiene: Sit to/from stand;Modified independent       Functional mobility during ADLs: Supervision/safety General ADL Comments: Pt states bathroom is upstairs and it is difficult for him to climb the stairs due to fatigue and pain. Would benefit from reacher, tub bench and BSC. May need long handled sponge  Pt states he has difficulty climbing stairs due to his weakness. States he is scared he will fall when stepping over tub.     Vision         Perception     Praxis      Pertinent Vitals/Pain Pain Assessment: 0-10 Pain Score: 8  Faces Pain Scale: Hurts a little bit Pain Location: abdomen - with movement Pain Descriptors / Indicators: Grimacing;Discomfort     Hand Dominance Right   Extremity/Trunk Assessment Upper Extremity Assessment Upper Extremity Assessment: Generalized weakness   Lower Extremity Assessment Lower Extremity Assessment: Generalized weakness   Cervical / Trunk Assessment Cervical / Trunk Assessment: Normal   Communication Communication Communication: No difficulties   Cognition Arousal/Alertness: Awake/alert Behavior During Therapy: WFL for tasks assessed/performed Overall Cognitive Status: Within Functional Limits for tasks assessed  General Comments       Exercises     Shoulder Instructions      Home Living Family/patient expects to be discharged to:: Private residence Living Arrangements: Spouse/significant other;Other (Comment) (fiance) Available Help at Discharge: Family;Available PRN/intermittently Type of Home: House Home Access: Stairs to enter     Home Layout: Two level;Bed/bath upstairs Alternate Level Stairs-Number of Steps: flight   Bathroom Shower/Tub: Tub/shower unit;Curtain   Biochemist, clinical: Standard Bathroom Accessibility:  Yes How Accessible: Accessible via walker Home Equipment: Walker - 4 wheels          Prior Functioning/Environment Level of Independence: Independent        Comments: reports being independent, moving without an AD.         OT Problem List: Decreased strength;Decreased activity tolerance;Impaired balance (sitting and/or standing);Decreased knowledge of use of DME or AE;Pain      OT Treatment/Interventions: Self-care/ADL training;Energy conservation;DME and/or AE instruction;Therapeutic activities;Patient/family education    OT Goals(Current goals can be found in the care plan section) Acute Rehab OT Goals Patient Stated Goal: to go home OT Goal Formulation: With patient Time For Goal Achievement: 05/28/16 Potential to Achieve Goals: Good  OT Frequency: Min 2X/week   Barriers to D/C: Decreased caregiver support          Co-evaluation              AM-PAC PT "6 Clicks" Daily Activity     Outcome Measure Help from another person eating meals?: None Help from another person taking care of personal grooming?: None Help from another person toileting, which includes using toliet, bedpan, or urinal?: None Help from another person bathing (including washing, rinsing, drying)?: None Help from another person to put on and taking off regular upper body clothing?: None Help from another person to put on and taking off regular lower body clothing?: None 6 Click Score: 24   End of Session Nurse Communication: Other (comment) (pt state his home has bed bugs; DME needs)  Activity Tolerance: Patient tolerated treatment well Patient left: in bed;with call bell/phone within reach  OT Visit Diagnosis: Unsteadiness on feet (R26.81);Pain Pain - part of body:  (abdomen)                Time: 7096-2836 OT Time Calculation (min): 17 min Charges:  OT General Charges $OT Visit: 1 Procedure OT Evaluation $OT Eval Moderate Complexity: 1 Procedure G-Codes:     Alok Minshall, OT/L   [phone] 05/14/2016  Marcus Brooks,Marcus Brooks 05/14/2016, 4:40 PM

## 2016-05-14 NOTE — Care Management Note (Addendum)
Case Management Note  Patient Details  Name: Marcus Brooks MRN: [id number] Date of Birth: 10/10/1960  Subjective/Objective:                 Patient has tube feeds- states it is Ensure, and supplies provided through [facility]. He states he uses a syringe to give himself feeds several times a day. He has his medications in pill packets made by [facility] pharmacy and delivered to his home. Would [facility] to [facility] for [facility] health if he is sent home with IV abx, has PICC to R upper arm. Has a roommate named Marcus Brooks. Patient states that either Marcus Brooks or his fiance would be able to assist him with his IV Abx. Notified Marcus Brooks for potential IV needs. CM notified [facility] of his admission.   Upon verification w [facility] patient has not been active with them since 2016. They stopped services as patient was not ordering supplies. [facility] would be able to resume care. They would need recs from RD, including feed type, amount, and gravity vs pump administration. Need order for home pump if not to gravity as well as supplies.  LM with Marcus Brooks with [facility] to see if patient may be connected with them. [phone].   Requested BSC and Tub Bench to be delivered to room prior to discharge   Action/Plan:   Expected Discharge Date:                  Expected Discharge Plan:  [place]  In-House Referral:  NA  Discharge planning Services  CM Consult  Post Acute Care Choice:  Home Health, Durable Medical Equipment Choice offered to:  Patient  DME Arranged:   (Tube feedings) DME Agency:     HH Arranged:  RN [place] Agency:     Status of Service:  In process, will continue to follow  If discussed at Long Length of Stay Meetings, dates discussed:    Additional Comments:  Marcus Collet, RN 05/14/2016, 2:02 PM

## 2016-05-14 NOTE — Progress Notes (Signed)
PROGRESS NOTE    Marcus Brooks  SNK:[id number] DOB: 01/18/1960 DOA: 05/09/2016 PCP: [name], FNP    Brief Narrative:  Patient is a 56 year old gentleman history of chronic pancreatitis initially secondary to alcohol abuse presented with a one-month history of significant weight loss of 50 pounds, malaise, abdominal pain, poor oral intake worsened over the past month. CT abdomen and pelvis which was done concerning for a walled off fluid collection anterior and superior to the pancreas with large amount of edema. GI and general surgery were consulted. PICC line placed and patient started on TPN. Patient also now started on tube feeds and clear liquids. No surgical intervention at this time. Patient placed empirically on IV antibiotics.   Assessment & Plan:   Principal Problem:   Acute on chronic pancreatitis (HCC) Active Problems:   Hypertension   GERD (gastroesophageal reflux disease)   Hematemesis   Seizure disorder (HCC)   Protein-calorie malnutrition, severe (HCC)   Chronic pain syndrome   Tobacco use disorder   Coronary artery disease   Atherosclerosis of aorta (HCC)   Phlegmon of pancreas   Lymphadenopathy   Malnutrition ([place])  #1 acute on chronic pancreatitis with phlegmon Patient with a prior history of pancreatitis initially secondary to our, with pseudocysts. Patient with clinical improvement. Patient currently afebrile. Patient tolerating clear liquids. Patient getting tube feeds from the G-tube. Patient also on TPN. Patient was on IV Merrem and has been transitioned to IV Unasyn. Per GI curbside with ID recommending that patient likely requires 3 weeks of IV antibiotics/Unasyn. Patient will likely need repeat CT abdomen and pelvis just prior to antibiotics discontinuation approximately 3 weeks. Per nursing patient refused conversion of G-tube to [place] tube. Continue supportive care. Patient has been started on tube feeds which have been titrated to goal. Will wean off  TPN. General surgery and GI following. Patient insistent on being discharged. Once tube feeds at goal and TPN has been discontinued patient be discharged home when he to follow-up with GI and PCP in the outpatient setting.  #2 gastroesophageal reflux disease PPI.  #3 seizure disorder Stable. Continue Dilantin.  #4 severe protein calorie malnutrition Patient receiving tube feeds from the G-tube. Patient also on TPN and will wean TPN off. Dietitian following. Patient insistent on being discharged home. Once tube feeds are at goal and TPN has been discontinued patient may be discharged home.  #5 left lung mass/left hilar adenopathy Concerning for malignancy. CT scan chest of 04/28/2016. Patient scheduled for office visit with oncology Dr. Earlie Server on 05/20/2016.  #6 anemia Stable. Follow H&H.    DVT prophylaxis: SCDs Code Status: Full Family Communication: Updated patient. No family at bedside. Disposition Plan: Home when medically stable when patient receiving adequate nutritional support from tube feeds that would meet greater or equal to 90% of the needs.   Consultants:   General surgery Dr. Alvino Blood 05/10/2016  Gastroenterology: Dr.Hung 05/09/2016  Procedures:   CT abdomen and pelvis 05/09/2016  Chest x-ray 05/09/2016    Antimicrobials:   IV Unasyn 05/11/2016  IV Merrem 05/09/2016>>>>> 05/11/2016.   Subjective: Patient denies any chest pain. No shortness of breath. Patient states improvement with abdominal pain and no longer acute and close to his baseline chronic abdominal pain. Patient states was nauseous with clear liquids. Patient tolerating tube feeds. Patient very insistent on being discharged home today as he states he doesn't go he's gone to lose everything that he's had over the past year. Patient concerned that the tags on his car  has expired and consened that his car will be towed.  Objective: Vitals:   05/14/16 0434 05/14/16 0900 05/14/16 1126  05/14/16 1435  BP: (!) 107/59 (!) 104/48 (!) 99/56 (!) 102/57  Pulse: (!) 56 (!) 54 (!) 53 (!) 55  Resp: 17 12    Temp: 97.8 F (36.6 C) 97.5 F (36.4 C)  97.4 F (36.3 C)  TempSrc: Oral Oral  Oral  SpO2: 99% 100%    Weight:      Height:        Intake/Output Summary (Last 24 hours) at 05/14/16 1525 Last data filed at 05/14/16 1300  Gross per 24 hour  Intake          2346.33 ml  Output             2050 ml  Net           296.33 ml   Filed Weights   05/11/16 0152 05/12/16 2043 05/13/16 2014  Weight: 43.3 kg (95 lb 7.4 oz) 42.2 kg (93 lb) 42.2 kg (93 lb)    Examination:  General exam: Appears calm and comfortable.Cachectic. Chronically ill-looking.  Respiratory system: Clear to auscultation. Respiratory effort normal. Cardiovascular system: S1 & S2 heard, RRR. No JVD, murmurs, rubs, gallops or clicks. No pedal edema. Gastrointestinal system: Abdomen is nondistended, soft and nontender. No organomegaly or masses felt. Normal bowel sounds heard. PEG tube in place. Central nervous system: Alert and oriented. No focal neurological deficits. Extremities: Symmetric 5 x 5 power. Skin: No rashes, lesions or ulcers Psychiatry: Judgement and insight appear normal. Mood & affect appropriate.     Data Reviewed: I have personally reviewed following labs and imaging studies  CBC:  Recent Labs Lab 05/09/16 1152 05/10/16 0443 05/11/16 0430 05/14/16 0508  WBC 12.0* 8.2 7.1 7.9  NEUTROABS 9.4*  --  5.0  --   HGB 12.2* 9.0* 8.9* 8.6*  HCT 36.3* 27.2* 25.5* 25.8*  MCV 94.8 95.8 94.8 93.8  PLT 181 143* 135* 546*   Basic Metabolic Panel:  Recent Labs Lab 05/10/16 0443 05/10/16 0907 05/11/16 0430 05/12/16 0432 05/13/16 0456 05/14/16 0508  NA 140  --  136 134* 135 133*  K 3.5  --  3.5 3.6 3.9 4.2  CL 107  --  106 105 104 100*  CO2 26  --  '25 26 26 29  '$ GLUCOSE 71  --  88 129* 98 124*  BUN 7  --  6 <5* <5* 5*  CREATININE 0.63  --  0.57* 0.46* 0.49* 0.46*  CALCIUM 8.2*  --   7.8* 7.9* 8.4* 8.2*  MG  --  1.7 1.7  --  1.8 1.8  PHOS  --  2.8 1.7* 1.9* 2.4* 2.8   GFR: Estimated Creatinine Clearance: 62.3 mL/min (A) (by C-G formula based on SCr of 0.46 mg/dL (L)). Liver Function Tests:  Recent Labs Lab 05/09/16 1152 05/10/16 0443 05/11/16 0430 05/12/16 0432 05/14/16 0508  AST 24 14* '15 19 17  '$ ALT 13* 11* 10* 10* 11*  ALKPHOS 143* 104 86 86 80  BILITOT 0.7 0.6 0.5 0.5 0.1*  PROT 7.5 5.6* 5.2* 5.6* 5.4*  ALBUMIN 3.6 2.6* 2.4* 2.5* 2.5*    Recent Labs Lab 05/09/16 1152 05/10/16 0443  LIPASE 63* 43   No results for input(s): AMMONIA in the last 168 hours. Coagulation Profile:  Recent Labs Lab 05/10/16 1329  INR 1.12   Cardiac Enzymes: No results for input(s): CKTOTAL, CKMB, CKMBINDEX, TROPONINI in the last 168 hours. BNP (  last 3 results) No results for input(s): PROBNP in the last 8760 hours. HbA1C: No results for input(s): HGBA1C in the last 72 hours. CBG:  Recent Labs Lab 05/13/16 2009 05/14/16 0020 05/14/16 0433 05/14/16 0758 05/14/16 1232  GLUCAP 103* 147* 149* 97 136*   Lipid Profile: No results for input(s): CHOL, HDL, LDLCALC, TRIG, CHOLHDL, LDLDIRECT in the last 72 hours. Thyroid Function Tests: No results for input(s): TSH, T4TOTAL, FREET4, T3FREE, THYROIDAB in the last 72 hours. Anemia Panel: No results for input(s): VITAMINB12, FOLATE, FERRITIN, TIBC, IRON, RETICCTPCT in the last 72 hours. Sepsis Labs:  Recent Labs Lab 05/09/16 1211  LATICACIDVEN 1.86    No results found for this or any previous visit (from the past 240 hour(s)).       Radiology Studies: No results found.      Scheduled Meds: . feeding supplement (VITAL AF 1.2 CAL)  1,000 mL Per Tube Q24H  . free water  100 mL Per Tube Q8H  . insulin aspart  0-9 Units Subcutaneous TID WC  . phenytoin (DILANTIN) IV  100 mg Intravenous Q8H  . sodium chloride flush  3 mL Intravenous Q12H   Continuous Infusions: . sodium chloride 40 mL/hr at 05/13/16  1838  . ampicillin-sulbactam (UNASYN) IV    . sodium phosphate  Dextrose 5% IVPB 20 mmol (05/14/16 1327)  . Marland KitchenTPN (CLINIMIX-E) Adult 40 mL/hr at 05/13/16 1723     LOS: 4 days    Time spent: 68 minutes    THOMPSON,DANIEL, MD [facility] Pager [phone]  If 7PM-7AM, please contact night-coverage [email or web address] Password [email or web address] 05/14/2016, 3:25 PM ]

## 2016-05-14 NOTE — Progress Notes (Signed)
[place] pt for [facility] this hospital admission.  [facility] will provide [facility] services for possible home IV ABX upon DC if needed.  [facility] team will follow pt progress while inpatient to support as needed for home care services.   If patient discharges after hours, please call [phone].   Larry Sierras 05/14/2016, 4:17 PM

## 2016-05-15 ENCOUNTER — Encounter (INDEPENDENT_AMBULATORY_CARE_PROVIDER_SITE_OTHER): Payer: Self-pay | Admitting: *Deleted

## 2016-05-15 LAB — COMPREHENSIVE METABOLIC PANEL
ALBUMIN: 2.5 g/dL — AB (ref 3.5–5.0)
ALK PHOS: 78 U/L (ref 38–126)
ALT: 9 U/L — AB (ref 17–63)
AST: 16 U/L (ref 15–41)
Anion gap: 7 (ref 5–15)
BILIRUBIN TOTAL: 0.2 mg/dL — AB (ref 0.3–1.2)
BUN: 5 mg/dL — ABNORMAL LOW (ref 6–20)
CALCIUM: 8.2 mg/dL — AB (ref 8.9–10.3)
CO2: 29 mmol/L (ref 22–32)
CREATININE: 0.42 mg/dL — AB (ref 0.61–1.24)
Chloride: 100 mmol/L — ABNORMAL LOW (ref 101–111)
GFR calc non Af Amer: >60 mL/min (ref >60)
GLUCOSE: 117 mg/dL — AB (ref 65–99)
Potassium: 4 mmol/L (ref 3.5–5.1)
SODIUM: 136 mmol/L (ref 135–145)
TOTAL PROTEIN: 5.5 g/dL — AB (ref 6.5–8.1)

## 2016-05-15 LAB — CBC WITH DIFFERENTIAL/PLATELET
Basophils Absolute: 0 10*3/uL (ref 0.0–0.1)
Basophils Relative: 1 %
EOS ABS: 0.4 10*3/uL (ref 0.0–0.7)
EOS PCT: 6 %
HCT: 25.2 % — ABNORMAL LOW (ref 39.0–52.0)
Hemoglobin: 8.3 g/dL — ABNORMAL LOW (ref 13.0–17.0)
LYMPHS ABS: 1.4 10*3/uL (ref 0.7–4.0)
LYMPHS PCT: 20 %
MCH: 30.9 pg (ref 26.0–34.0)
MCHC: 32.9 g/dL (ref 30.0–36.0)
MCV: 93.7 fL (ref 78.0–100.0)
MONO ABS: 0.6 10*3/uL (ref 0.1–1.0)
Monocytes Relative: 9 %
Neutro Abs: 4.5 10*3/uL (ref 1.7–7.7)
Neutrophils Relative %: 64 %
PLATELETS: 139 10*3/uL — AB (ref 150–400)
RBC: 2.69 MIL/uL — ABNORMAL LOW (ref 4.22–5.81)
RDW: 14.2 % (ref 11.5–15.5)
WBC: 6.9 10*3/uL (ref 4.0–10.5)

## 2016-05-15 LAB — GLUCOSE, CAPILLARY
Glucose-Capillary: 106 mg/dL — ABNORMAL HIGH (ref 65–99)
Glucose-Capillary: 124 mg/dL — ABNORMAL HIGH (ref 65–99)
Glucose-Capillary: 124 mg/dL — ABNORMAL HIGH (ref 65–99)
Glucose-Capillary: 129 mg/dL — ABNORMAL HIGH (ref 65–99)

## 2016-05-15 LAB — PHENYTOIN LEVEL, TOTAL: Phenytoin Lvl: 7.8 ug/mL — ABNORMAL LOW (ref 10.0–20.0)

## 2016-05-15 LAB — PHOSPHORUS: Phosphorus: 3.4 mg/dL (ref 2.5–4.6)

## 2016-05-15 LAB — MAGNESIUM: Magnesium: 2 mg/dL (ref 1.7–2.4)

## 2016-05-15 MED ORDER — ONDANSETRON HCL 4 MG PO TABS: 4.0000 mg | ORAL_TABLET | Freq: Four times a day (QID) | ORAL | 0 refills | Status: AC | PRN

## 2016-05-15 MED ORDER — OXYCODONE HCL 10 MG PO TABS: 10.0000 mg | ORAL_TABLET | Freq: Four times a day (QID) | ORAL | 0 refills | Status: DC | PRN

## 2016-05-15 MED ORDER — OXYCODONE HCL 5 MG PO TABS
10.0000 mg | ORAL_TABLET | Freq: Four times a day (QID) | ORAL | Status: DC | PRN
  Administered 2016-05-15: 10 mg via ORAL
  Filled 2016-05-15: qty 2

## 2016-05-15 MED ORDER — FREE WATER: 200.0000 mL | Freq: Three times a day (TID) | Status: AC

## 2016-05-15 MED ORDER — VITAL AF 1.2 CAL PO LIQD: 1000.0000 mL | ORAL | Status: AC

## 2016-05-15 MED ORDER — MIRTAZAPINE 15 MG PO TABS: 15.0000 mg | ORAL_TABLET | Freq: Every evening | ORAL | Status: DC

## 2016-05-15 MED ORDER — DULOXETINE HCL 30 MG PO CPEP
30.0000 mg | ORAL_CAPSULE | Freq: Two times a day (BID) | ORAL | Status: DC
Start: 2016-05-15 — End: 2016-05-15
  Administered 2016-05-15: 30 mg via ORAL
  Filled 2016-05-15: qty 1

## 2016-05-15 MED ORDER — AMPICILLIN-SULBACTAM IV (FOR PTA / DISCHARGE USE ONLY)
1.5000 g | Freq: Three times a day (TID) | INTRAVENOUS | 0 refills | Status: DC
Start: 2016-05-15 — End: 2016-07-10

## 2016-05-15 MED ORDER — HEPARIN SOD (PORK) LOCK FLUSH 100 UNIT/ML IV SOLN
250.0000 [IU] | INTRAVENOUS | Status: AC | PRN
  Administered 2016-05-15: 250 [IU]

## 2016-05-15 MED ORDER — FAMOTIDINE 40 MG/5ML PO SUSR
20.0000 mg | Freq: Every day | ORAL | Status: DC
  Filled 2016-05-15: qty 2.5

## 2016-05-15 NOTE — Progress Notes (Signed)
Occupational Therapy Treatment Patient Details Name: Marcus Brooks MRN: [id number] DOB: [birth year] Today's Date: 05/15/2016    History of present illness Pt is a 56 yo male admitted through the ED with acute on chornic panceatitis, hematemesis, phlegm of the pancrease. PMH significant for chronic pancreatitis, htn, lung malignancy with chronic pain, siezure d/o, chronic pain, and feeding tube placement in 2012.    OT comments  Completed education regarding energy conservation, strategies to reduce risk of falls and use of DME and AE to maximize independence and safety with ADL, functional mobility and IADL tasks. Pt needs a 3in1 and tub bench for safe DC home. Pt verbalized understanding. OT signing off.   Follow Up Recommendations  No OT follow up;Supervision - Intermittent    Equipment Recommendations  3 in 1 bedside commode;Tub/shower bench    Recommendations for Other Services      Precautions / Restrictions Precautions Precautions: Fall Precaution Comments: feeding tube       Mobility Bed Mobility Overal bed mobility: Independent                Transfers Overall transfer level: Modified independent                    Balance                                           ADL either performed or assessed with clinical judgement   ADL                                       Functional mobility during ADLs: Modified independent General ADL Comments: completed education regarding energy conservation  and use of reacher and long handled sponge to assist with ADL and IADL tasks. Pt verbalized understanding. Educated on strategies to reduce risk of falls. Pt given written information.      Vision       Perception     Praxis      Cognition Arousal/Alertness: Awake/alert Behavior During Therapy: WFL for tasks assessed/performed Overall Cognitive Status: Within Functional Limits for tasks assessed                                          Exercises Exercises: Other exercises Other Exercises Other Exercises: level 2 theraband exercises for general UB strengthening   Shoulder Instructions       General Comments      Pertinent Vitals/ Pain       Pain Assessment: Faces Faces Pain Scale: Hurts a little bit Pain Location: abdomen - with movement Pain Descriptors / Indicators: Grimacing;Discomfort Pain Intervention(s): Limited activity within patient's tolerance  Home Living                                          Prior Functioning/Environment              Frequency           Progress Toward Goals  OT Goals(current goals can now be found in the care plan section)  Progress towards OT goals: Goals met/education completed, patient  discharged from OT  Acute Rehab OT Goals Patient Stated Goal: to go home OT Goal Formulation: With patient Time For Goal Achievement: 05/28/16 Potential to Achieve Goals: Good ADL Goals Additional ADL Goal #1: Pt will verbalize 3 energy conservation strategies for ADL Additional ADL Goal #2: Pt will independently verbalize 3 strategies to reduce risk of falls.  Plan Discharge plan remains appropriate;All goals met and education completed, patient discharged from OT services    Co-evaluation                 AM-PAC PT "6 Clicks" Daily Activity     Outcome Measure   Help from another person eating meals?: None Help from another person taking care of personal grooming?: None Help from another person toileting, which includes using toliet, bedpan, or urinal?: None Help from another person bathing (including washing, rinsing, drying)?: None Help from another person to put on and taking off regular upper body clothing?: None Help from another person to put on and taking off regular lower body clothing?: None 6 Click Score: 24    End of Session    OT Visit Diagnosis: Unsteadiness on feet (R26.81);Pain Pain - part of  body:  (abdomen)   Activity Tolerance Patient tolerated treatment well   Patient Left in bed;with call bell/phone within reach   Nurse Communication Other (comment) (DC questions)        Time: 3086-5784 OT Time Calculation (min): 19 min  Charges: OT General Charges $OT Visit: 1 Procedure OT Treatments $Self Care/Home Management : 8-22 mins  [facility], OT/L  [phone] 05/15/2016   Marcus Brooks,Marcus Brooks 05/15/2016, 10:17 AM

## 2016-05-15 NOTE — Progress Notes (Addendum)
Nutrition Note:  Pt being discharged to home today. Discussed plan of care with [place].   Recommended Tube Feeding Prescription:  Vital AF 1.2 at rate of 55 ml/hr for 24 hours  Free water flush of 200 mL q 8 hours  Per [facility] protocol, RD may use substitute formula  If pt able to tolerate, can consider switching to rate of 95 ml/hr over 14 hrs/day which can infuse overnight (provides same calories and protein as rate of 55 ml/hr for 24 hours).   Patient to be followed by [place] at home and dietitian will follow and make further TF recommendations and adjustments as needed.   Kerman Passey MS, RD, LDN [phone] Pager  [phone] Weekend/On-Call Pager

## 2016-05-15 NOTE — Care Management Note (Signed)
Case Management Note  Patient Details  Name: Marcus Brooks MRN: [id number] Date of Birth: 1960/10/22  Subjective/Objective:                    Action/Plan: Case manager contacted Melene Muller, [place] Liaison to confirm that [place] is in place for patient. Carolynn Sayers is aware of patient's discharge. CM has contacted MD for signature on orders for Tube feeds and for feeding pump. 3in1 and tub bench to be delivered to patient's room,  Expected Discharge Date:  05/15/16               Expected Discharge Plan:  [place]  In-House Referral:  NA  Discharge planning Services  CM Consult  Post Acute Care Choice:  Home Health, Durable Medical Equipment Choice offered to:  Patient  DME Arranged:   (Tube feedings) DME Agency:     HH Arranged:  RN [place] Agency:   [place]  Status of Service:  Completed If discussed at [place] of Stay Meetings, dates discussed:    Additional Comments:  Ninfa Meeker, RN 05/15/2016, 12:18 PM

## 2016-05-15 NOTE — Discharge Summary (Signed)
Physician Discharge Summary  Marcus Brooks BDZ:[id number] DOB: 04/10/1960 DOA: 05/09/2016  PCP: [name], FNP  Admit date: 05/09/2016 Discharge date: 05/15/2016  Time spent: 65 minutes  Recommendations for Outpatient Follow-up:  1. Follow-up with Dr. [name], GI in 1-2 weeks. On follow-up patient's acute on chronic pancreatitis with phlegmon will need to be reassessed. Patient will need repeat CT abdomen and pelvis just prior to discontinuation of IV antibiotics. Last of IV antibiotics 05/30/2016. 2. Follow-up with [name], FNP in 2 weeks. On follow-up patient in need a basic metabolic profile done to follow-up on electrolytes and renal function. 3. Follow-up with Dr. Lorna Few of oncology as scheduled 05/20/2016.   Discharge Diagnoses:  Principal Problem:   Acute on chronic pancreatitis (HCC) Active Problems:   Hypertension   GERD (gastroesophageal reflux disease)   Hematemesis   Seizure disorder (HCC)   Protein-calorie malnutrition, severe (HCC)   Chronic pain syndrome   Tobacco use disorder   Coronary artery disease   Atherosclerosis of aorta (HCC)   Phlegmon of pancreas   Lymphadenopathy   Malnutrition ([place])   Pancreatic lesion   Discharge Condition: Stable and improved  Diet recommendation: Clear liquids until seen by GI.  Filed Weights   05/12/16 2043 05/13/16 2014 05/14/16 2048  Weight: 42.2 kg (93 lb) 42.2 kg (93 lb) 43.1 kg (95 lb 1.6 oz)    History of present illness:  Per Dr. Izell [place] is an unfortunate 56 y.o. male with medical history significant seizure disorder chronic pain chronic pancreatitis related to ETOH, tobacco use since emergency Department chief complaint of worsening abdominal pain and persistent nausea and vomiting. Initial evaluation reveals acute on chronic pancreatitis and CT significant for phlegmonous mass.  Information was obtained from the patient and the chart as well as the fianc who was at the  bedside. Patient reported chronic abdominal pain for "a long time. Associated with unintentional weight loss persistent nausea and intermittent vomiting. He reported he had a PEG tube inserted after recent acute pancreatitis episode. He has been able to "hold down" water or tube feedings. Moving makes pain worse. He described the pain as a constant ache rated 10 out of 10 located in his abdominal area mostly. He denied any alcohol for over one year. He stated he had a colonoscopy scheduled for May 11 in [place] Chart review indicates patient was seen in March for weight loss to nerve pain. At that time he had a chest x-ray as part of his workup which revealed a left pleural effusion and hazy density over the aortic arch. No chest CT in April which showed nodule along the left lateral margin of the aortic with lymphadenopathy concerning for primary lung malignancy. He does have an appointment with oncology May 9.    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 Patient was admitted initially placed on bowel rest IV fluids pain management supportive care. GI was consulted. GI followed the patient throughout the hospitalization. Patient improved clinically.  Patient remained afebrile. Patient was started on clear liquids which he tolerated. Patient was also placed on TPN as well as started on tube feeds from the G-tube.  Patient was on IV Merrem and transitioned to IV Unasyn. Per GI curbside with ID recommended that patient likely requires 3 weeks of IV antibiotics/Unasyn. Patient will likely need repeat CT abdomen and pelvis just prior to antibiotics discontinuation approximately  3 weeks. Per nursing patient refused conversion of G-tube to [place] tube. Patient was maintained on supportive care. TPN was subsequently weaned off and tube feeds was slowly advanced to goal at 55 mL per hour. Patient was  insistent on being discharged in a such was discharged on tube feeds as well as clear liquids. Patient will follow-up with GI PCP and outpatient setting.  #2 gastroesophageal reflux disease  Patient was maintained on a PPI.  #3 seizure disorder Stable. Continued on home regimen of Dilantin.  #4 severe protein calorie malnutrition Patient received tube feeds from the G-tube. Patient also was initially on TPN. Patient was also seen by dietitian. Patient was started on clear liquids which is somewhat tolerated. TPN was subsequently weaned off.  Patient insistent on being discharged home. Tube feeds were adjusted and were at goal of 55 mL per hour by day of discharge. Patient was discharged home in stable condition and is to follow-up with PCP as outpatient.   #5 left lung mass/left hilar adenopathy Concerning for malignancy. CT scan chest of 04/28/2016. Patient scheduled for office visit with oncology Dr. Earlie Server on 05/20/2016. Outpatient follow-up.  #6 anemia Stable. Follow H&H.   Procedures:  CT abdomen and pelvis 05/09/2016  Chest x-ray 05/09/2016  Consultations:  General surgery Dr. Alvino Blood 05/10/2016  Gastroenterology: Dr.Hung 05/09/2016   Discharge Exam: Vitals:   05/15/16 0411 05/15/16 1000  BP: (!) 110/56 (!) 101/58  Pulse: (!) 54 (!) 59  Resp: 16 16  Temp: 98 F (36.7 C) 98.1 F (36.7 C)    General: NAD. Cachectic. Chronically ill-looking. Frail. Cardiovascular: RRR Respiratory: CTAB  Discharge Instructions   Discharge Instructions    Diet general    Complete by:  As directed    [place] GASTROENTEROLOGIST.   Home infusion instructions [facility] May follow [place] Dosing Protocol; May administer Cathflo as needed to maintain patency of vascular access device.; Flushing of vascular access device: per [facility] Protocol: 0.9% NaCl pre/post medica...    Complete by:  As directed    Instructions:  May follow [place]  Dosing Protocol   Instructions:  May administer Cathflo as needed to maintain patency of vascular access device.   Instructions:  Flushing of vascular access device: per [facility] Protocol: 0.9% NaCl pre/post medication administration and prn patency; Heparin 100 u/ml, 5m for implanted ports and Heparin 10u/ml, 561mfor all other central venous catheters.   Instructions:  May follow [facility] Anaphylaxis Protocol for First Dose Administration in the home: 0.9% NaCl at 25-50 ml/hr to maintain IV access for protocol meds. Epinephrine 0.3 ml IV/IM PRN and Benadryl 25-50 IV/IM PRN s/s of anaphylaxis.   Instructions:  [place] Coord[place] (RN) to assist per patient IV care needs in the home PRN.   Increase activity slowly    Complete by:  As directed      Current Discharge Medication List    START taking these medications   Details  ampicillin-sulbactam (UNASYN) IVPB Inject 1.5 g into the vein every 8 (eight) hours. Indication:  Acute on chronic pancreatitis with phlegmon Last Day of Therapy:  05/30/2016 Labs - Once weekly:  CBC/D and BMP, Labs - Every other week:  ESR and CRP Qty: 49 Units, Refills: 0    Nutritional Supplements (FEEDING SUPPLEMENT, VITAL AF 1.2 CAL,) LIQD Place 1,000 mLs into feeding tube daily. At 558mr    ondansetron (ZOFRAN) 4 MG tablet Take 1 tablet (4 mg total) by mouth every 6 (  six) hours as needed for nausea. Qty: 20 tablet, Refills: 0    Water For Irrigation, Sterile (FREE WATER) SOLN Place 200 mLs into feeding tube every 8 (eight) hours.      CONTINUE these medications which have CHANGED   Details  Oxycodone HCl 10 MG TABS Take 1 tablet (10 mg total) by mouth every 6 (six) hours as needed. Qty: 15 tablet, Refills: 0   Associated Diagnoses: Generalized abdominal pain      CONTINUE these medications which have NOT CHANGED   Details  albuterol (PROVENTIL HFA;VENTOLIN HFA) 108 (90 BASE) MCG/ACT inhaler Inhale 2 puffs into the lungs every 6 (six) hours as  needed for wheezing or shortness of breath.     CREON 24000-76000 units CPEP TAKE TWO CAPSULES BY MOUTH BEFORE MEALS Qty: 168 each, Refills: 3    DULoxetine (CYMBALTA) 30 MG capsule Take 1 capsule (30 mg total) by mouth 2 (two) times daily. Qty: 60 capsule, Refills: 2   Associated Diagnoses: Neuropathy; Depression, unspecified depression type    ferrous sulfate 325 (65 FE) MG tablet TAKE ONE (1) TABLET BY MOUTH EVERY DAY WITH BREAKFAST Qty: 30 tablet, Refills: 3    gabapentin (NEURONTIN) 300 MG capsule Take 1 capsule (300 mg total) by mouth 4 (four) times daily as needed. Qty: 180 capsule, Refills: 3   Associated Diagnoses: Neuropathy    hydrocortisone cream 0.5 % Apply 1 application topically 2 (two) times daily. Qty: 30 g, Refills: 0   Associated Diagnoses: Acute allergic reaction, initial encounter    pantoprazole (PROTONIX) 40 MG tablet TAKE ONE (1) TABLET BY MOUTH EVERY DAY Qty: 30 tablet, Refills: 3    phenytoin (DILANTIN) 100 MG ER capsule TAKE THREE CAPSULES BY MOUTH DAILY Qty: 90 capsule, Refills: 3   Associated Diagnoses: Seizure disorder (HCC)    promethazine (PHENERGAN) 25 MG tablet Take 25 mg by mouth every 6 (six) hours as needed for nausea or vomiting.     ranitidine (ZANTAC) 150 MG capsule TAKE 1 CAPSULE BY MOUTH TWICE DAILY Qty: 60 capsule, Refills: 3    vitamin C (ASCORBIC ACID) 500 MG tablet Take 1 tablet (500 mg total) by mouth daily. Qty: 30 tablet, Refills: 3   Associated Diagnoses: Tobacco use disorder    Adhesive Tape (CLOTH ADHESIVE SURG 1/2"X10YD) TAPE 1 each by Does not apply route 2 (two) times daily as needed. Qty: 1 each, Refills: 2   Associated Diagnoses: Irritation around percutaneous endoscopic gastrostomy (PEG) tube site (HCC)    Gauze Pads & Dressings (BIOGUARD GAUZE SPONGES) 4"X4" PADS 1 each by Does not apply route 2 (two) times daily. Qty: 1 each, Refills: 5   Associated Diagnoses: Irritation around percutaneous endoscopic gastrostomy  (PEG) tube site (HCC)    mirtazapine (REMERON) 15 MG tablet TAKE ONE TABLET BY MOUTH EVERY EVENING Qty: 30 tablet, Refills: 3      STOP taking these medications     amLODipine (NORVASC) 10 MG tablet      potassium chloride SA (K-DUR,KLOR-CON) 20 MEQ tablet      traZODone (DESYREL) 100 MG tablet        Allergies  Allergen Reactions  . Latex Rash   Follow-up Information    [facility] Follow up.   Why:  tub bench and bed side commode to be delivered to room.  Contact information: [address] [place] [address] [phone]        Lionel December, MD. Schedule an appointment as soon as possible for a  visit in 1 week(s).   Specialty:  Gastroenterology Why:  F/U IN 1-2 WEEK. Contact information: [place], [address] [place] [address] [phone]        [name], FNP. Schedule an appointment as soon as possible for a visit in 2 week(s).   Specialty:  Family Medicine Contact information: [place] [place] [address] [phone]            The results of significant diagnostics from this hospitalization (including imaging, microbiology, ancillary and laboratory) are listed below for reference.    Significant Diagnostic Studies: Dg Chest 2 View  Result Date: 05/09/2016 CLINICAL DATA:  Chest pain and weight loss EXAM: CHEST  2 VIEW COMPARISON:  Chest radiograph April 06, 2016 and chest CT April 28, 2016 FINDINGS: There is stable mild blunting of the left costophrenic angle. There is no edema or consolidation. Nodular opacity adjacent to the aortic arch on the left is appreciable by radiography but better seen by CT. Heart size is normal. The pulmonary vascularity is normal. There is left hilar adenopathy, stable. No new adenopathy evident. No bone lesions. Coils are noted in the upper abdomen on the left. There are multiple calcified splenic granulomas. There is aortic atherosclerosis. IMPRESSION:  Persistent left hilar adenopathy. Stable nodular opacity adjacent to the aortic arch on the left. No edema or consolidation. Mild scarring lateral left base. Aortic atherosclerosis. Calcified splenic granulomas present. Electronically Signed   By: Lowella Grip III M.D.   On: 05/09/2016 11:26   Ct Chest Wo Contrast  Result Date: 04/28/2016 CLINICAL DATA:  Smoker for 5 years. Chest pain. Abnormal chest x-ray. EXAM: CT CHEST WITHOUT CONTRAST TECHNIQUE: Multidetector CT imaging of the chest was performed following the standard protocol without IV contrast. COMPARISON:  None. FINDINGS: Cardiovascular: No significant vascular findings. Normal heart size. No pericardial effusion. Coronary artery atherosclerosis in the LAD. Mediastinum/Nodes: Enlarge AP window lymph node measuring 2 cm in short axis. Ill-defined left hilar lymphadenopathy. Thyroid gland, trachea, and esophagus demonstrate no significant findings. Lungs/Pleura: No pleural effusion or pneumothorax. Chronic left basilar pleural thickening. 1.4 x 1.7 cm nodule along the left lateral margin of the aortic arch. No other pulmonary nodule or soft tissue mass. Bilateral mild centrilobular emphysema. No pleural effusion or pneumothorax. Upper Abdomen: Gastrostomy tube in satisfactory position. Calcifications in the pancreas as can be seen with chronic pancreatitis or prior granulomatous disease. Calcifications noted in the liver and spleen likely secondary to prior granulomatous disease. Musculoskeletal: No acute osseous abnormality. No lytic or sclerotic osseous lesion. IMPRESSION: 1. 1.4 x 1.7 cm nodule along the left lateral margin of the aortic large. The nodule is isodense with the aorta. Left hilar and AP window lymphadenopathy. Overall appearance is most concerning for primary lung malignancy. CT of the chest with intravenous contrast is recommended to completely exclude an aneurysm. These results will be called to the ordering clinician or  representative by the Radiologist Assistant, and communication documented in the PACS or zVision Dashboard. Electronically Signed   By: Kathreen Devoid   On: 04/28/2016 15:33   Ct Abdomen Pelvis W Contrast  Result Date: 05/09/2016 CLINICAL DATA:  Hemoptysis and abdominal pain over the last 2 days. Recent weight loss. EXAM: CT ABDOMEN AND PELVIS WITH CONTRAST TECHNIQUE: Multidetector CT imaging of the abdomen and pelvis was performed using the standard protocol following bolus administration of intravenous contrast. CONTRAST:  63m ISOVUE-300 IOPAMIDOL (ISOVUE-300) INJECTION 61% COMPARISON:  11/29/2014. FINDINGS: Lower chest: Lung bases  are clear.  No pleural or pericardial fluid. Hepatobiliary: Multiple scattered hepatic granulomas as seen previously. No acute liver finding. No calcified gallstones. Pancreas: Chronic pancreatic calcifications. There appears to be pronounced active inflammation in the lesser sac with a 6-7 cm phlegmonous inflammatory mass with a central low density measuring about 2 cm that could represent actual abscess. This indents the posterior wall of the stomach. Spleen: Multiple calcified granulomas.  Otherwise unremarkable. Adrenals/Urinary Tract: Adrenal glands are normal. Kidneys are normal. Stomach/Bowel: Gastrostomy tube in the stomach. No evidence of bowel obstruction or primary bowel pathology. Vascular/Lymphatic: Aortic atherosclerosis.  No aneurysm. Reproductive: Negative Other: No free fluid or air. Musculoskeletal: Negative IMPRESSION: Acute inflammation in the lesser sac region, likely secondary to chronic pancreatitis. Phlegmonous inflammatory mass measuring approximately 7 cm in diameter. Central nonenhancing region measuring about 2 cm in diameter that could represent a central abscess. Electronically Signed   By: Nelson Chimes M.D.   On: 05/09/2016 15:36    Microbiology: No results found for this or any previous visit (from the past 240 hour(s)).   Labs: Basic Metabolic  Panel:  Recent Labs Lab 05/10/16 0907 05/11/16 0430 05/12/16 0432 05/13/16 0456 05/14/16 0508 05/15/16 0508  NA  --  136 134* 135 133* 136  K  --  3.5 3.6 3.9 4.2 4.0  CL  --  106 105 104 100* 100*  CO2  --  '25 26 26 29 29  '$ GLUCOSE  --  88 129* 98 124* 117*  BUN  --  6 <5* <5* 5* 5*  CREATININE  --  0.57* 0.46* 0.49* 0.46* 0.42*  CALCIUM  --  7.8* 7.9* 8.4* 8.2* 8.2*  MG 1.7 1.7  --  1.8 1.8 2.0  PHOS 2.8 1.7* 1.9* 2.4* 2.8 3.4   Liver Function Tests:  Recent Labs Lab 05/10/16 0443 05/11/16 0430 05/12/16 0432 05/14/16 0508 05/15/16 0508  AST 14* '15 19 17 16  '$ ALT 11* 10* 10* 11* 9*  ALKPHOS 104 86 86 80 78  BILITOT 0.6 0.5 0.5 0.1* 0.2*  PROT 5.6* 5.2* 5.6* 5.4* 5.5*  ALBUMIN 2.6* 2.4* 2.5* 2.5* 2.5*    Recent Labs Lab 05/09/16 1152 05/10/16 0443  LIPASE 63* 43   No results for input(s): AMMONIA in the last 168 hours. CBC:  Recent Labs Lab 05/09/16 1152 05/10/16 0443 05/11/16 0430 05/14/16 0508 05/15/16 0508  WBC 12.0* 8.2 7.1 7.9 6.9  NEUTROABS 9.4*  --  5.0  --  4.5  HGB 12.2* 9.0* 8.9* 8.6* 8.3*  HCT 36.3* 27.2* 25.5* 25.8* 25.2*  MCV 94.8 95.8 94.8 93.8 93.7  PLT 181 143* 135* 137* 139*   Cardiac Enzymes: No results for input(s): CKTOTAL, CKMB, CKMBINDEX, TROPONINI in the last 168 hours. BNP: BNP (last 3 results) No results for input(s): BNP in the last 8760 hours.  ProBNP (last 3 results) No results for input(s): PROBNP in the last 8760 hours.  CBG:  Recent Labs Lab 05/14/16 1732 05/14/16 2052 05/15/16 0029 05/15/16 0408 05/15/16 0801  GLUCAP 123* 94 124* 129* 124*       Signed:  Donatello Kleve MD.  [facility] 05/15/2016, 11:25 AM

## 2016-05-15 NOTE — Progress Notes (Signed)
Patient discharged to home with all belongings in tow, all scripts and equipment reviewed. All followup appts reviewed. Patient left unit in stable condition.  Sheliah Plane RN

## 2016-05-15 NOTE — Progress Notes (Signed)
Phenytoin information  Patient was on phenytoin '300mg'$  XR qHS PTA, but missed some doses due to N/V prior to admission.  Transitioned to phenytoin '100mg'$  IV q8h here.  *4/29 level 3 (alb 2.6)- corrected 4.84 >> no dose changes made as low level was likely from missed doses at home *5/4 level 7.9 (alb 2.5)- corrected 13.2 which is in therapeutic range    Plan: Recommend continuing phenytoin '100mg'$  IV q8h while here in the hospital, and then transitioning back to phenytoin '300mg'$  XR qHS upon discharge  Fani Rotondo D. Usha Slager, PharmD, BCPS Clinical Pharmacist Pager: [phone] 05/15/2016 9:06 AM

## 2016-05-16 ENCOUNTER — Emergency Department (HOSPITAL_COMMUNITY)
Admission: EM | Admit: 2016-05-16 | Discharge: 2016-05-16 | Disposition: A | Discharge status: Home / Self Care | Payer: Medicaid Other | Attending: Emergency Medicine | Admitting: Emergency Medicine

## 2016-05-16 ENCOUNTER — Emergency Department (HOSPITAL_COMMUNITY): Payer: Medicaid Other

## 2016-05-16 ENCOUNTER — Encounter (HOSPITAL_COMMUNITY): Payer: Self-pay | Admitting: Emergency Medicine

## 2016-05-16 DIAGNOSIS — Z79899 Other long term (current) drug therapy: Secondary | ICD-10-CM | POA: Diagnosis not present

## 2016-05-16 DIAGNOSIS — K9423 Gastrostomy malfunction: Secondary | ICD-10-CM | POA: Diagnosis not present

## 2016-05-16 DIAGNOSIS — F17210 Nicotine dependence, cigarettes, uncomplicated: Secondary | ICD-10-CM | POA: Insufficient documentation

## 2016-05-16 DIAGNOSIS — I2510 Atherosclerotic heart disease of native coronary artery without angina pectoris: Secondary | ICD-10-CM | POA: Insufficient documentation

## 2016-05-16 DIAGNOSIS — J449 Chronic obstructive pulmonary disease, unspecified: Secondary | ICD-10-CM | POA: Insufficient documentation

## 2016-05-16 DIAGNOSIS — Z91040 Latex allergy status: Secondary | ICD-10-CM | POA: Insufficient documentation

## 2016-05-16 MED ORDER — IOPAMIDOL (ISOVUE-300) INJECTION 61%
INTRAVENOUS | Status: AC
  Administered 2016-05-16: 40 mL via GASTROSTOMY
  Filled 2016-05-16: qty 50

## 2016-05-16 NOTE — ED Notes (Signed)
Peg tube as been cut accidentally and leaking noted.  Patient states he is also having upper abdominal pain.  Denies fever, nausea, vomiting.

## 2016-05-16 NOTE — ED Notes (Signed)
Peg tube replaced with a 100F G Tube.  Gauze dressing placed on top of tube and taped with paper tape.

## 2016-05-16 NOTE — ED Triage Notes (Signed)
Patient here requesting peg tube being changed after patient cut tube while trying to remove dressing. NAD. Leaking noted from tube

## 2016-05-16 NOTE — ED Provider Notes (Signed)
[facility] DEPT Provider Note   CSN: [id number] Arrival date & time: 05/16/16  [place]     History   Chief Complaint No chief complaint on file.   HPI Marcus Brooks is a 56 y.o. male.  HPI Patient presents after accidentally cutting his PEG tube. States he was changing the dressing and cut it with some scissors. It has been draining. He supplements his intake with it. Has history of pancreatitis and severe malnutrition. He also does take some food orally. No abdominal pain. He was placed initially closed 3 year ago. Past Medical History:  Diagnosis Date  . Anxiety states 08/04/2010  . Chronic airway obstruction, not elsewhere classified 08/04/2010  . Chronic pain syndrome 08/04/2010  . Chronic pancreatitis ([place]) 08/04/2010  . Coronary artery disease   . GERD (gastroesophageal reflux disease)   . Hiatal hernia    EGD 01/18/13  . Insomnia 08/04/2010  . Intellectual disability   . Other abnormal glucose 08/04/2010  . Pancreatitis chronic 08/04/2010  . Protein calorie malnutrition ([place]) 2010  . Seizure ([place]) 08/04/2010  . Splenic vein thrombosis    Chronic.  . Tobacco use disorder 08/04/2010  . Unspecified essential hypertension 08/04/2010    Patient Active Problem List   Diagnosis Date Noted  . Pancreatic lesion   . Malnutrition ([place])   . Phlegmon of pancreas 05/09/2016  . Acute on chronic pancreatitis ([place]) 05/09/2016  . Lymphadenopathy 05/09/2016  . Hx of colonic polyps 04/23/2016  . Atherosclerosis of aorta ([place]) 04/09/2016  . History of colonic polyps 02/19/2016  . Dizziness 06/10/2015  . Pancreatic pseudocyst 03/13/2013  . Cellulitis 01/24/2013  . Gastritis 01/24/2013  . Pancreatitis 01/23/2013  . Irritation around percutaneous endoscopic gastrostomy (PEG) tube site ([place]) 01/23/2013  . Hyponatremia 01/19/2013  . Hiatal hernia 01/19/2013  . Acute blood loss anemia 01/18/2013  . 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 Coronary artery disease   . Upper GI bleed  01/17/2013  . Chronic pancreatitis ([place]) 01/17/2013  . Protein-calorie malnutrition, severe ([place]) 01/10/2013  . Hematemesis 01/09/2013  . Pancreatitis, acute 01/09/2013  . Seizure disorder ([place]) 01/09/2013  . Syncope 01/09/2013  . Acute pancreatitis 10/03/2012  . Leukocytosis 10/03/2012  . Transaminitis 10/03/2012  . Chest pain 10/03/2012  . Pain around PEG tube site 03/09/2011  . Hypertension 11/05/2010  . GERD (gastroesophageal reflux disease) 11/05/2010  . Chronic pain syndrome 08/04/2010  . Tobacco use disorder 08/04/2010    Past Surgical History:  Procedure Laterality Date  . CORONARY ANGIOPLASTY WITH STENT PLACEMENT    . ESOPHAGOGASTRODUODENOSCOPY N/A 01/18/2013   Procedure: ESOPHAGOGASTRODUODENOSCOPY (EGD);  Surgeon: Rogene Houston, MD;  [facility];  Service: Endoscopy;  Laterality: N/A;  . EUS  03/26/2009   [facility]  . GASTROSTOMY-JEJEUNOSTOMY TUBE CHANGE/PLACEMENT  12/11/2008   [facility]  . I&D EXTREMITY Left 09/12/2012   Procedure: IRRIGATION AND DEBRIDEMENT LEFT FIFTH FINGER;  Surgeon: Roseanne Kaufman, MD;  Location: [place];  Service: Orthopedics;  Laterality: Left;  . JEJUNOSTOMY FEEDING TUBE  07/13/2010  . NERVE AND TENDON REPAIR Left 09/12/2012   Procedure: NERVE AND TENDON REPAIR LEFT FIFTH FINGER;  Surgeon: Roseanne Kaufman, MD;  Location: [place];  Service: Orthopedics;  Laterality: Left;  . OPEN REDUCTION INTERNAL FIXATION (ORIF) METACARPAL Left 09/12/2012   Procedure: OPEN REDUCTION INTERNAL FIXATION LEFT FIFTH FINGER;  Surgeon: Roseanne Kaufman, MD;  Location: [place];  Service: Orthopedics;  Laterality: Left;  . PANCREATIC PSEUDOCYST DRAINAGE  07/13/2010  . PORTACATH PLACEMENT  11/28/2008  [facility]       Home Medications    Prior to Admission medications   Medication Sig Start Date End Date Taking? Authorizing Provider  Adhesive Tape (CLOTH ADHESIVE SURG 1/2"X10YD) TAPE 1 each by Does not apply route 2 (two) times daily as needed. 03/09/16    [name], FNP  albuterol (PROVENTIL HFA;VENTOLIN HFA) 108 (90 BASE) MCG/ACT inhaler Inhale 2 puffs into the lungs every 6 (six) hours as needed for wheezing or shortness of breath.     [provider]  ampicillin-sulbactam (UNASYN) IVPB Inject 1.5 g into the vein every 8 (eight) hours. Indication:  Acute on chronic pancreatitis with phlegmon Last Day of Therapy:  05/30/2016 Labs - Once weekly:  CBC/D and BMP, Labs - Every other week:  ESR and CRP 05/15/16   Eugenie Filler, MD  CREON [phone] units CPEP TAKE TWO CAPSULES BY MOUTH BEFORE MEALS 04/15/16   [name], FNP  DULoxetine (CYMBALTA) 30 MG capsule Take 1 capsule (30 mg total) by mouth 2 (two) times daily. 04/06/16   [name], FNP  ferrous sulfate 325 (65 FE) MG tablet TAKE ONE (1) TABLET BY MOUTH EVERY DAY WITH BREAKFAST Patient taking differently: Take 325 mg by mouth daily with breakfast. TAKE ONE (1) TABLET BY MOUTH EVERY DAY WITH BREAKFAST 02/12/16   Micheline Chapman, NP  gabapentin (NEURONTIN) 300 MG capsule Take 1 capsule (300 mg total) by mouth 4 (four) times daily as needed. Patient taking differently: Take 600 mg by mouth 3 (three) times daily.  01/28/16   [name], FNP  Gauze Pads & Dressings (BIOGUARD GAUZE SPONGES) 4"X4" PADS 1 each by Does not apply route 2 (two) times daily. 03/09/16   [name], FNP  hydrocortisone cream 0.5 % Apply 1 application topically 2 (two) times daily. 02/28/16   [name], FNP  mirtazapine (REMERON) 15 MG tablet TAKE ONE TABLET BY MOUTH EVERY EVENING 05/14/16   [name], FNP  Nutritional Supplements (FEEDING SUPPLEMENT, VITAL AF 1.2 CAL,) LIQD Place 1,000 mLs into feeding tube daily. At 37m/hr 05/16/16   TEugenie Filler MD  ondansetron (ZOFRAN) 4 MG tablet Take 1 tablet (4 mg total) by mouth every 6 (six) hours as needed for nausea. 05/15/16   TEugenie Filler MD  Oxycodone HCl 10 MG TABS Take 1 tablet (10 mg total) by mouth every 6  (six) hours as needed. 05/15/16   TEugenie Filler MD  pantoprazole (PROTONIX) 40 MG tablet TAKE ONE (1) TABLET BY MOUTH EVERY DAY 04/15/16   HDorena Dew FNP  phenytoin (DILANTIN) 100 MG ER capsule TAKE THREE CAPSULES BY MOUTH DAILY 04/15/16   HDorena Dew FNP  promethazine (PHENERGAN) 25 MG tablet Take 25 mg by mouth every 6 (six) hours as needed for nausea or vomiting.     [provider]  ranitidine (ZANTAC) 150 MG capsule TAKE 1 CAPSULE BY MOUTH TWICE DAILY Patient taking differently: TAKE 1 CAPSULE BY MOUTH AT BEDTIME 06/13/15   BMicheline Chapman NP  vitamin C (ASCORBIC ACID) 500 MG tablet Take 1 tablet (500 mg total) by mouth daily. 11/20/15   BMicheline Chapman NP  Water For Irrigation, Sterile (FREE WATER) SOLN Place 200 mLs into feeding tube every 8 (eight) hours. 05/15/16   TEugenie Filler MD    Family History No family history on file.  Social History Social History  Substance Use Topics  . Smoking status: Current Every Day Smoker  Packs/day: 1.00    Years: 20.00    Types: Cigarettes  . Smokeless tobacco: Never Used     Comment: 1 pack a day since age 25  . Alcohol use No     Comment: No etoh since 5-6 yrs     Allergies   Latex   Review of Systems Review of Systems  Constitutional: Negative for appetite change.  Gastrointestinal: Negative for abdominal pain and vomiting.  Hematological: Does not bruise/bleed easily.     Physical Exam Updated Vital Signs BP (!) 114/59   Pulse 71   Temp 98.2 F (36.8 C) (Oral)   Resp 18   SpO2 100%   Physical Exam  Constitutional:  Patient is cachectic  HENT:  Head: Normocephalic.  Pulmonary/Chest: Effort normal.  Abdominal:  PEG tube in place in left abdomen.  Musculoskeletal: He exhibits no edema.  Neurological: He is alert.  Skin: Skin is warm.     ED Treatments / Results  Labs (all labs ordered are listed, but only abnormal results are displayed) Labs Reviewed - No data to  display  EKG  EKG Interpretation None       Radiology Dg Abdomen 1 View  Result Date: 05/16/2016 CLINICAL DATA:  G-tube placement.  Initial encounter. EXAM: ABDOMEN - 1 VIEW COMPARISON:  CT of the abdomen and pelvis performed 05/09/2016 FINDINGS: The patient's G-tube is noted overlying the lesser curvature of the stomach. Lack of contrast about the G-tube is thought to reflect injection of water. Injected contrast is seen filling the stomach as expected. Coils are seen at the left upper quadrant. The visualized bowel gas pattern is grossly unremarkable. No free intra-abdominal air is seen, though evaluation for free air is limited on a single supine view. IMPRESSION: Injected contrast noted filling the stomach as expected. Electronically Signed   By: Garald Balding M.D.   On: 05/16/2016 20:54    Procedures FEEDING TUBE REPLACEMENT Date/Time: 05/16/2016 7:00 PM Performed by: Davonna Belling Authorized by: Davonna Belling  Consent: Verbal consent obtained. Written consent not obtained. Risks and benefits: risks, benefits and alternatives were discussed Consent given by: patient Patient understanding: patient states understanding of the procedure being performed Required items: required blood products, implants, devices, and special equipment available Time out: Immediately prior to procedure a "time out" was called to verify the correct patient, procedure, equipment, support staff and site/side marked as required. Indications: tube cracked Local anesthesia used: no  Anesthesia: Local anesthesia used: no  Sedation: Patient sedated: no Tube type: gastrostomy Patient position: supine Procedure type: replacement Tube size: 20 french. Endoscope used: no Bulb inflation volume: 6 (ml) Bulb inflation fluid: normal saline (air) Placement/position confirmation: x-ray Tube placement difficulty: none Patient tolerance: Patient tolerated the procedure well with no immediate  complications    (including critical care time)  Medications Ordered in ED Medications  iopamidol (ISOVUE-300) 61 % injection (40 mLs PEG Tube Contrast Given 05/16/16 2030)     Initial Impression / Assessment and Plan / ED Course  I have reviewed the triage vital signs and the nursing notes.  Pertinent labs & imaging results that were available during my care of the patient were reviewed by me and considered in my medical decision making (see chart for details).     Patient accidentally cut his PEG tube. Replaced in the ER. Discharge home.  Final Clinical Impressions(s) / ED Diagnoses   Final diagnoses:  PEG tube malfunction [facility])    New Prescriptions Discharge Medication List as of 05/16/2016  8:50 PM       Davonna Belling, MD 05/16/16 2311

## 2016-05-18 ENCOUNTER — Encounter (HOSPITAL_COMMUNITY): Admission: RE | Admit: 2016-05-18 | Payer: Medicaid Other | Source: Ambulatory Visit

## 2016-05-19 ENCOUNTER — Other Ambulatory Visit: Payer: Self-pay | Admitting: Medical Oncology

## 2016-05-19 DIAGNOSIS — R911 Solitary pulmonary nodule: Secondary | ICD-10-CM

## 2016-05-20 ENCOUNTER — Ambulatory Visit (HOSPITAL_BASED_OUTPATIENT_CLINIC_OR_DEPARTMENT_OTHER): Payer: Medicaid Other | Admitting: Internal Medicine

## 2016-05-20 ENCOUNTER — Other Ambulatory Visit: Payer: Self-pay

## 2016-05-20 ENCOUNTER — Inpatient Hospital Stay (HOSPITAL_COMMUNITY)
Admission: EM | Admit: 2016-05-20 | Discharge: 2016-05-21 | DRG: 811 | Discharge status: Against Medical Advice | Payer: Medicaid Other | Attending: Internal Medicine | Admitting: Internal Medicine

## 2016-05-20 ENCOUNTER — Telehealth: Payer: Self-pay | Admitting: Internal Medicine

## 2016-05-20 ENCOUNTER — Other Ambulatory Visit (HOSPITAL_BASED_OUTPATIENT_CLINIC_OR_DEPARTMENT_OTHER): Payer: Medicaid Other

## 2016-05-20 ENCOUNTER — Emergency Department (HOSPITAL_COMMUNITY): Payer: Medicaid Other

## 2016-05-20 ENCOUNTER — Encounter: Payer: Self-pay | Admitting: *Deleted

## 2016-05-20 ENCOUNTER — Encounter: Payer: Self-pay | Admitting: Internal Medicine

## 2016-05-20 ENCOUNTER — Encounter (HOSPITAL_COMMUNITY): Payer: Self-pay | Admitting: Family Medicine

## 2016-05-20 VITALS — BP 96/52 | HR 62 | Temp 97.9°F | Resp 19 | Ht 66.0 in | Wt 97.8 lb

## 2016-05-20 DIAGNOSIS — Z955 Presence of coronary angioplasty implant and graft: Secondary | ICD-10-CM | POA: Diagnosis not present

## 2016-05-20 DIAGNOSIS — K859 Acute pancreatitis without necrosis or infection, unspecified: Secondary | ICD-10-CM | POA: Diagnosis present

## 2016-05-20 DIAGNOSIS — Z79899 Other long term (current) drug therapy: Secondary | ICD-10-CM

## 2016-05-20 DIAGNOSIS — G40909 Epilepsy, unspecified, not intractable, without status epilepticus: Secondary | ICD-10-CM | POA: Diagnosis present

## 2016-05-20 DIAGNOSIS — E43 Unspecified severe protein-calorie malnutrition: Secondary | ICD-10-CM | POA: Diagnosis not present

## 2016-05-20 DIAGNOSIS — Z8659 Personal history of other mental and behavioral disorders: Secondary | ICD-10-CM

## 2016-05-20 DIAGNOSIS — K861 Other chronic pancreatitis: Secondary | ICD-10-CM | POA: Diagnosis present

## 2016-05-20 DIAGNOSIS — R911 Solitary pulmonary nodule: Secondary | ICD-10-CM

## 2016-05-20 DIAGNOSIS — J449 Chronic obstructive pulmonary disease, unspecified: Secondary | ICD-10-CM | POA: Diagnosis present

## 2016-05-20 DIAGNOSIS — Z87898 Personal history of other specified conditions: Secondary | ICD-10-CM

## 2016-05-20 DIAGNOSIS — I10 Essential (primary) hypertension: Secondary | ICD-10-CM | POA: Diagnosis present

## 2016-05-20 DIAGNOSIS — F17210 Nicotine dependence, cigarettes, uncomplicated: Secondary | ICD-10-CM | POA: Diagnosis present

## 2016-05-20 DIAGNOSIS — K219 Gastro-esophageal reflux disease without esophagitis: Secondary | ICD-10-CM | POA: Diagnosis present

## 2016-05-20 DIAGNOSIS — R1012 Left upper quadrant pain: Secondary | ICD-10-CM | POA: Diagnosis present

## 2016-05-20 DIAGNOSIS — E46 Unspecified protein-calorie malnutrition: Secondary | ICD-10-CM | POA: Diagnosis present

## 2016-05-20 DIAGNOSIS — I82891 Chronic embolism and thrombosis of other specified veins: Secondary | ICD-10-CM | POA: Diagnosis present

## 2016-05-20 DIAGNOSIS — R079 Chest pain, unspecified: Secondary | ICD-10-CM | POA: Diagnosis present

## 2016-05-20 DIAGNOSIS — Z5321 Procedure and treatment not carried out due to patient leaving prior to being seen by health care provider: Secondary | ICD-10-CM | POA: Diagnosis not present

## 2016-05-20 DIAGNOSIS — K920 Hematemesis: Secondary | ICD-10-CM

## 2016-05-20 DIAGNOSIS — E876 Hypokalemia: Secondary | ICD-10-CM | POA: Diagnosis present

## 2016-05-20 DIAGNOSIS — I2510 Atherosclerotic heart disease of native coronary artery without angina pectoris: Secondary | ICD-10-CM

## 2016-05-20 DIAGNOSIS — D649 Anemia, unspecified: Principal | ICD-10-CM

## 2016-05-20 DIAGNOSIS — Z91040 Latex allergy status: Secondary | ICD-10-CM

## 2016-05-20 DIAGNOSIS — K860 Alcohol-induced chronic pancreatitis: Secondary | ICD-10-CM | POA: Diagnosis not present

## 2016-05-20 DIAGNOSIS — R64 Cachexia: Secondary | ICD-10-CM | POA: Diagnosis present

## 2016-05-20 DIAGNOSIS — Z959 Presence of cardiac and vascular implant and graft, unspecified: Secondary | ICD-10-CM

## 2016-05-20 DIAGNOSIS — G894 Chronic pain syndrome: Secondary | ICD-10-CM | POA: Diagnosis present

## 2016-05-20 DIAGNOSIS — F79 Unspecified intellectual disabilities: Secondary | ICD-10-CM | POA: Diagnosis present

## 2016-05-20 DIAGNOSIS — Z931 Gastrostomy status: Secondary | ICD-10-CM

## 2016-05-20 DIAGNOSIS — E441 Mild protein-calorie malnutrition: Secondary | ICD-10-CM

## 2016-05-20 DIAGNOSIS — K210 Gastro-esophageal reflux disease with esophagitis: Secondary | ICD-10-CM | POA: Diagnosis present

## 2016-05-20 DIAGNOSIS — K449 Diaphragmatic hernia without obstruction or gangrene: Secondary | ICD-10-CM | POA: Diagnosis present

## 2016-05-20 HISTORY — DX: Anemia, unspecified: D64.9

## 2016-05-20 HISTORY — DX: Solitary pulmonary nodule: R91.1

## 2016-05-20 LAB — CBC WITH DIFFERENTIAL/PLATELET
BASO%: 1.3 % (ref 0.0–2.0)
BASOS ABS: 0.1 10*3/uL (ref 0.0–0.1)
EOS ABS: 0.3 10*3/uL (ref 0.0–0.5)
EOS%: 2.8 % (ref 0.0–7.0)
HEMATOCRIT: 17.4 % — AB (ref 38.4–49.9)
HEMOGLOBIN: 5.8 g/dL — AB (ref 13.0–17.1)
LYMPH#: 2.2 10*3/uL (ref 0.9–3.3)
LYMPH%: 22.9 % (ref 14.0–49.0)
MCH: 32.6 pg (ref 27.2–33.4)
MCHC: 33.5 g/dL (ref 32.0–36.0)
MCV: 97.2 fL (ref 79.3–98.0)
MONO#: 0.9 10*3/uL (ref 0.1–0.9)
MONO%: 9.4 % (ref 0.0–14.0)
NEUT%: 63.6 % (ref 39.0–75.0)
NEUTROS ABS: 6 10*3/uL (ref 1.5–6.5)
Platelets: 293 10*3/uL (ref 140–400)
RBC: 1.79 10*6/uL — ABNORMAL LOW (ref 4.20–5.82)
RDW: 14.4 % (ref 11.0–14.6)
WBC: 9.4 10*3/uL (ref 4.0–10.3)

## 2016-05-20 LAB — CBC
HCT: 24.7 % — ABNORMAL LOW (ref 39.0–52.0)
HCT: 26.5 % — ABNORMAL LOW (ref 39.0–52.0)
Hemoglobin: 8.3 g/dL — ABNORMAL LOW (ref 13.0–17.0)
Hemoglobin: 9.1 g/dL — ABNORMAL LOW (ref 13.0–17.0)
MCH: 32.4 pg (ref 26.0–34.0)
MCH: 32.2 pg (ref 26.0–34.0)
MCHC: 33.6 g/dL (ref 30.0–36.0)
MCHC: 34.3 g/dL (ref 30.0–36.0)
MCV: 96.5 fL (ref 78.0–100.0)
MCV: 93.6 fL (ref 78.0–100.0)
PLATELETS: 266 10*3/uL (ref 150–400)
Platelets: 233 10*3/uL (ref 150–400)
RBC: 2.56 MIL/uL — ABNORMAL LOW (ref 4.22–5.81)
RBC: 2.83 MIL/uL — AB (ref 4.22–5.81)
RDW: 13.9 % (ref 11.5–15.5)
RDW: 14.6 % (ref 11.5–15.5)
WBC: 7.2 10*3/uL (ref 4.0–10.5)
WBC: 6.9 10*3/uL (ref 4.0–10.5)

## 2016-05-20 LAB — COMPREHENSIVE METABOLIC PANEL
ALK PHOS: 81 U/L (ref 40–150)
ALT: 15 U/L (ref 0–55)
AST: 18 U/L (ref 5–34)
Albumin: 2.7 g/dL — ABNORMAL LOW (ref 3.5–5.0)
Anion Gap: 6 mEq/L (ref 3–11)
BUN: 6.9 mg/dL — AB (ref 7.0–26.0)
CALCIUM: 8.7 mg/dL (ref 8.4–10.4)
CO2: 24 mEq/L (ref 22–29)
Chloride: 107 mEq/L (ref 98–109)
Creatinine: 0.6 mg/dL — ABNORMAL LOW (ref 0.7–1.3)
GLUCOSE: 97 mg/dL (ref 70–140)
Potassium: 3.9 mEq/L (ref 3.5–5.1)
SODIUM: 137 meq/L (ref 136–145)
TOTAL PROTEIN: 6.7 g/dL (ref 6.4–8.3)

## 2016-05-20 LAB — BASIC METABOLIC PANEL
Anion gap: 6 (ref 5–15)
BUN: 8 mg/dL (ref 6–20)
CO2: 24 mmol/L (ref 22–32)
CREATININE: 0.41 mg/dL — AB (ref 0.61–1.24)
Calcium: 8.4 mg/dL — ABNORMAL LOW (ref 8.9–10.3)
Chloride: 105 mmol/L (ref 101–111)
GFR calc Af Amer: >60 mL/min (ref >60)
GLUCOSE: 89 mg/dL (ref 65–99)
POTASSIUM: 3.6 mmol/L (ref 3.5–5.1)
SODIUM: 135 mmol/L (ref 135–145)

## 2016-05-20 LAB — LIPASE, BLOOD: Lipase: 25 U/L (ref 11–51)

## 2016-05-20 LAB — I-STAT TROPONIN, ED: Troponin i, poc: 0 ng/mL (ref 0.00–0.08)

## 2016-05-20 LAB — TROPONIN I

## 2016-05-20 LAB — POC OCCULT BLOOD, ED: Fecal Occult Bld: NEGATIVE

## 2016-05-20 LAB — PREPARE RBC (CROSSMATCH)

## 2016-05-20 MED ORDER — HYDROMORPHONE HCL 1 MG/ML IJ SOLN
1.0000 mg | Freq: Once | INTRAMUSCULAR | Status: AC
  Administered 2016-05-20: 1 mg via INTRAVENOUS
  Filled 2016-05-20: qty 1

## 2016-05-20 MED ORDER — ACETAMINOPHEN 325 MG PO TABS: 650.0000 mg | ORAL_TABLET | Freq: Four times a day (QID) | ORAL | Status: DC | PRN

## 2016-05-20 MED ORDER — PANCRELIPASE (LIP-PROT-AMYL) 12000-38000 UNITS PO CPEP
24000.0000 [IU] | ORAL_CAPSULE | Freq: Three times a day (TID) | ORAL | Status: DC
  Administered 2016-05-21: 24000 [IU] via ORAL
  Filled 2016-05-20: qty 2

## 2016-05-20 MED ORDER — SODIUM CHLORIDE 0.9% FLUSH
10.0000 mL | INTRAVENOUS | Status: DC | PRN
  Administered 2016-05-20: 20 mL
  Administered 2016-05-21: 10 mL
  Filled 2016-05-20: qty 40

## 2016-05-20 MED ORDER — AMPICILLIN-SULBACTAM SODIUM 1.5 (1-0.5) G IJ SOLR
1.5000 g | Freq: Three times a day (TID) | INTRAMUSCULAR | Status: DC
  Administered 2016-05-20 – 2016-05-21 (×2): 1.5 g via INTRAVENOUS
  Filled 2016-05-20 (×3): qty 1.5

## 2016-05-20 MED ORDER — ACETAMINOPHEN 650 MG RE SUPP: 650.0000 mg | Freq: Four times a day (QID) | RECTAL | Status: DC | PRN

## 2016-05-20 MED ORDER — OXYCODONE HCL 5 MG PO TABS
10.0000 mg | ORAL_TABLET | Freq: Four times a day (QID) | ORAL | Status: DC | PRN
  Administered 2016-05-20 – 2016-05-21 (×2): 10 mg via ORAL
  Filled 2016-05-20 (×2): qty 2

## 2016-05-20 MED ORDER — SODIUM CHLORIDE 0.9% FLUSH: 3.0000 mL | Freq: Two times a day (BID) | INTRAVENOUS | Status: DC

## 2016-05-20 MED ORDER — PANTOPRAZOLE SODIUM 40 MG IV SOLR: 40.0000 mg | Freq: Two times a day (BID) | INTRAVENOUS | Status: DC

## 2016-05-20 MED ORDER — SODIUM CHLORIDE 0.9 % IV SOLN
80.0000 mg | Freq: Once | INTRAVENOUS | Status: AC
  Administered 2016-05-20: 20:00:00 80 mg via INTRAVENOUS
  Filled 2016-05-20: qty 80

## 2016-05-20 MED ORDER — GABAPENTIN 300 MG PO CAPS
600.0000 mg | ORAL_CAPSULE | Freq: Three times a day (TID) | ORAL | Status: DC
  Administered 2016-05-20: 600 mg via ORAL
  Filled 2016-05-20: qty 2

## 2016-05-20 MED ORDER — FREE WATER
200.0000 mL | Freq: Three times a day (TID) | Status: DC
  Administered 2016-05-20 – 2016-05-21 (×2): 200 mL

## 2016-05-20 MED ORDER — NICOTINE 21 MG/24HR TD PT24
21.0000 mg | MEDICATED_PATCH | Freq: Every day | TRANSDERMAL | Status: DC | PRN
  Administered 2016-05-20: 21 mg via TRANSDERMAL
  Filled 2016-05-20: qty 1

## 2016-05-20 MED ORDER — ALBUTEROL SULFATE (2.5 MG/3ML) 0.083% IN NEBU: 3.0000 mL | INHALATION_SOLUTION | Freq: Four times a day (QID) | RESPIRATORY_TRACT | Status: DC | PRN

## 2016-05-20 MED ORDER — MIRTAZAPINE 30 MG PO TABS
15.0000 mg | ORAL_TABLET | Freq: Every evening | ORAL | Status: DC
  Administered 2016-05-20: 15 mg via ORAL
  Filled 2016-05-20: qty 1
  Filled 2016-05-20: qty 0.5

## 2016-05-20 MED ORDER — SODIUM CHLORIDE 0.9 % IV SOLN
8.0000 mg/h | INTRAVENOUS | Status: DC
  Administered 2016-05-20 – 2016-05-21 (×2): 8 mg/h via INTRAVENOUS
  Filled 2016-05-20 (×4): qty 80

## 2016-05-20 MED ORDER — ONDANSETRON HCL 4 MG/2ML IJ SOLN
4.0000 mg | Freq: Four times a day (QID) | INTRAMUSCULAR | Status: DC | PRN
  Administered 2016-05-21: 4 mg via INTRAVENOUS
  Filled 2016-05-20: qty 2

## 2016-05-20 MED ORDER — SODIUM CHLORIDE 0.9 % IV SOLN
10.0000 mL/h | Freq: Once | INTRAVENOUS | Status: AC
  Administered 2016-05-20: 10 mL/h via INTRAVENOUS

## 2016-05-20 MED ORDER — PHENYTOIN SODIUM EXTENDED 100 MG PO CAPS: 300.0000 mg | ORAL_CAPSULE | Freq: Every day | ORAL | Status: DC

## 2016-05-20 MED ORDER — ONDANSETRON HCL 4 MG PO TABS: 4.0000 mg | ORAL_TABLET | Freq: Four times a day (QID) | ORAL | Status: DC | PRN

## 2016-05-20 MED ORDER — SODIUM CHLORIDE 0.9 % IV SOLN
INTRAVENOUS | Status: AC
  Administered 2016-05-20 – 2016-05-21 (×2): via INTRAVENOUS

## 2016-05-20 MED ORDER — ONDANSETRON HCL 4 MG/2ML IJ SOLN
4.0000 mg | Freq: Once | INTRAMUSCULAR | Status: AC
  Administered 2016-05-20: 4 mg via INTRAVENOUS
  Filled 2016-05-20: qty 2

## 2016-05-20 MED ORDER — AMPICILLIN-SULBACTAM IV (FOR PTA / DISCHARGE USE ONLY): 1.5000 g | Freq: Three times a day (TID) | INTRAVENOUS | Status: DC

## 2016-05-20 MED ORDER — DULOXETINE HCL 30 MG PO CPEP
30.0000 mg | ORAL_CAPSULE | Freq: Two times a day (BID) | ORAL | Status: DC
  Administered 2016-05-20: 30 mg via ORAL
  Filled 2016-05-20: qty 1

## 2016-05-20 NOTE — Progress Notes (Signed)
[place] [phone]   [phone]  CONSULT NOTE  REFERRING PHYSICIAN: Cammie Sickle, FNP  REASON FOR CONSULTATION:  56 years old white male with suspicious lung nodule.  HPI Marcus Brooks is a 56 y.o. male with past medical history significant for multiple medical problems including history of acute on chronic pancreatitis, hypertension, seizure disorder more than year ago, GERD, severe malnutrition, chronic pain syndrome, coronary artery disease as well as TIA. The patient also has a long history of smoking. He has been complaining of chest pain and shortness of breath as well as weight loss of over 50 pounds in the last few months. He was seen by his family nurse practitioner and chest x-ray on 04/06/2016 showed new tiny left pleural effusion with subtle hazy density just lateral to the aortic arch with mild soft tissue prominence in the left hilar region. This was followed by CT scan of the chest without contrast on 04/28/2016 and it showed 1.4 x 1.7 cm nodule along the left lateral margin of the aortic arch. There was also left hilar and AP window lymphadenopathy. The appearance was concerning for primary lung malignancy. The patient was referred to the clinic today for evaluation of this abnormality. He was recently admitted to [facility] with acute pancreatitis. He has a PEG tube placed for nutrition. He also has a PICC line for antibiotics. He is doing it himself at home. He presented today for evaluation of the abnormality in his lung. He continues to have significant abdominal pain but no significant nausea or vomiting. Smaller than 15 pounds in the last few months. He has no headache or visual changes. He has no visible bleeding abnormality. He is followed by Dr. Corbin Ade in [place] for his gastrointestinal problems. Family history significant for father with brain tumor. The patient is single and was accompanied today by his girlfriend Marcus Brooks.  He used to work as truckdriver and currently on disability. He has a history of smoking 1 pack per day for around 40 years and unfortunately continues to smoke. He also has a history of alcohol abuse and no history of drug abuse.   HPI  Past Medical History:  Diagnosis Date  . Anxiety states 08/04/2010  . Chronic airway obstruction, not elsewhere classified 08/04/2010  . Chronic pain syndrome 08/04/2010  . Chronic pancreatitis ([place]) 08/04/2010  . Coronary artery disease   . GERD (gastroesophageal reflux disease)   . Hiatal hernia    EGD 01/18/13  . Insomnia 08/04/2010  . Intellectual disability   . Other abnormal glucose 08/04/2010  . Pancreatitis chronic 08/04/2010  . Protein calorie malnutrition ([place]) 2010  . Seizure ([place]) 08/04/2010  . Splenic vein thrombosis    Chronic.  . Tobacco use disorder 08/04/2010  . Unspecified essential hypertension 08/04/2010    Past Surgical History:  Procedure Laterality Date  . CORONARY ANGIOPLASTY WITH STENT PLACEMENT    . ESOPHAGOGASTRODUODENOSCOPY N/A 01/18/2013   Procedure: ESOPHAGOGASTRODUODENOSCOPY (EGD);  Surgeon: Rogene Houston, MD;  [facility];  Service: Endoscopy;  Laterality: N/A;  . EUS  03/26/2009   [facility]  . GASTROSTOMY-JEJEUNOSTOMY TUBE CHANGE/PLACEMENT  12/11/2008   [facility]  . I&D EXTREMITY Left 09/12/2012   Procedure: IRRIGATION AND DEBRIDEMENT LEFT FIFTH FINGER;  Surgeon: Roseanne Kaufman, MD;  Location: [place];  Service: Orthopedics;  Laterality: Left;  . JEJUNOSTOMY FEEDING TUBE  07/13/2010  . NERVE AND TENDON REPAIR Left 09/12/2012   Procedure: NERVE AND TENDON REPAIR LEFT  FIFTH FINGER;  Surgeon: Roseanne Kaufman, MD;  Location: [place];  Service: Orthopedics;  Laterality: Left;  . OPEN REDUCTION INTERNAL FIXATION (ORIF) METACARPAL Left 09/12/2012   Procedure: OPEN REDUCTION INTERNAL FIXATION LEFT FIFTH FINGER;  Surgeon: Roseanne Kaufman, MD;  Location: [place];  Service: Orthopedics;  Laterality: Left;  .  PANCREATIC PSEUDOCYST DRAINAGE  07/13/2010  . PORTACATH PLACEMENT  11/28/2008   FLEISHMAN    History reviewed. No pertinent family history.  Social History Social History  Substance Use Topics  . Smoking status: Current Every Day Smoker    Packs/day: 1.00    Years: 20.00    Types: Cigarettes  . Smokeless tobacco: Never Used     Comment: 1 pack a day since age 55  . Alcohol use No     Comment: No etoh since 5-6 yrs    Allergies  Allergen Reactions  . Latex Rash    Current Outpatient Prescriptions  Medication Sig Dispense Refill  . Adhesive Tape (CLOTH ADHESIVE SURG 1/2"X10YD) TAPE 1 each by Does not apply route 2 (two) times daily as needed. 1 each 2  . albuterol (PROVENTIL HFA;VENTOLIN HFA) 108 (90 BASE) MCG/ACT inhaler Inhale 2 puffs into the lungs every 6 (six) hours as needed for wheezing or shortness of breath.     Marland Kitchen ampicillin-sulbactam (UNASYN) IVPB Inject 1.5 g into the vein every 8 (eight) hours. Indication:  Acute on chronic pancreatitis with phlegmon Last Day of Therapy:  05/30/2016 Labs - Once weekly:  CBC/D and BMP, Labs - Every other week:  ESR and CRP 49 Units 0  . CREON 24000-76000 units CPEP TAKE TWO CAPSULES BY MOUTH BEFORE MEALS 168 each 3  . DULoxetine (CYMBALTA) 30 MG capsule Take 1 capsule (30 mg total) by mouth 2 (two) times daily. 60 capsule 2  . ferrous sulfate 325 (65 FE) MG tablet TAKE ONE (1) TABLET BY MOUTH EVERY DAY WITH BREAKFAST (Patient taking differently: Take 325 mg by mouth daily with breakfast. TAKE ONE (1) TABLET BY MOUTH EVERY DAY WITH BREAKFAST) 30 tablet 3  . gabapentin (NEURONTIN) 300 MG capsule Take 1 capsule (300 mg total) by mouth 4 (four) times daily as needed. (Patient taking differently: Take 600 mg by mouth 3 (three) times daily. ) 180 capsule 3  . Gauze Pads & Dressings (BIOGUARD GAUZE SPONGES) 4"X4" PADS 1 each by Does not apply route 2 (two) times daily. 1 each 5  . hydrocortisone cream 0.5 % Apply 1 application topically 2 (two)  times daily. 30 g 0  . mirtazapine (REMERON) 15 MG tablet TAKE ONE TABLET BY MOUTH EVERY EVENING 30 tablet 3  . Nutritional Supplements (FEEDING SUPPLEMENT, VITAL AF 1.2 CAL,) LIQD Place 1,000 mLs into feeding tube daily. At 48m/hr    . ondansetron (ZOFRAN) 4 MG tablet Take 1 tablet (4 mg total) by mouth every 6 (six) hours as needed for nausea. 20 tablet 0  . Oxycodone HCl 10 MG TABS Take 1 tablet (10 mg total) by mouth every 6 (six) hours as needed. 15 tablet 0  . pantoprazole (PROTONIX) 40 MG tablet TAKE ONE (1) TABLET BY MOUTH EVERY DAY 30 tablet 3  . phenytoin (DILANTIN) 100 MG ER capsule TAKE THREE CAPSULES BY MOUTH DAILY 90 capsule 3  . promethazine (PHENERGAN) 25 MG tablet Take 25 mg by mouth every 6 (six) hours as needed for nausea or vomiting.     . ranitidine (ZANTAC) 150 MG capsule TAKE 1 CAPSULE BY MOUTH TWICE DAILY (Patient taking differently: TAKE 1  CAPSULE BY MOUTH AT BEDTIME) 60 capsule 3  . vitamin C (ASCORBIC ACID) 500 MG tablet Take 1 tablet (500 mg total) by mouth daily. 30 tablet 3  . Water For Irrigation, Sterile (FREE WATER) SOLN Place 200 mLs into feeding tube every 8 (eight) hours.     No current facility-administered medications for this visit.     Review of Systems  Constitutional: positive for anorexia, fatigue and weight loss Eyes: negative Ears, nose, mouth, throat, and face: negative Respiratory: positive for dyspnea on exertion Cardiovascular: negative Gastrointestinal: positive for abdominal pain Genitourinary:negative Integument/breast: negative Hematologic/lymphatic: positive for pale Musculoskeletal:negative Neurological: negative Behavioral/Psych: negative Endocrine: negative Allergic/Immunologic: negative  Physical Exam  MPN:TIRWE, healthy, no distress, ill looking, malnourished and pale SKIN: Skin pallor HEAD: Normocephalic, No masses, lesions, tenderness or abnormalities EYES: normal, PERRLA EARS: External ears normal, Canals  clear OROPHARYNX:no exudate and no erythema  NECK: supple, no adenopathy, no JVD LYMPH:  no palpable lymphadenopathy, no hepatosplenomegaly LUNGS: clear to auscultation , and palpation HEART: regular rate & rhythm, no murmurs and no gallops ABDOMEN:abdomen soft, non-tender, normal bowel sounds and no masses or organomegaly BACK: Back symmetric, no curvature., No CVA tenderness EXTREMITIES:no joint deformities, effusion, or inflammation, no edema  NEURO: alert & oriented x 3 with fluent speech, no focal motor/sensory deficits  PERFORMANCE STATUS: ECOG 1  LABORATORY DATA: Lab Results  Component Value Date   WBC 9.4 05/20/2016   HGB 5.8 (LL) 05/20/2016   HCT 17.4 (L) 05/20/2016   MCV 97.2 05/20/2016   PLT 293 05/20/2016      Chemistry      Component Value Date/Time   NA 137 05/20/2016 1416   K 3.9 05/20/2016 1416   CL 100 (L) 05/15/2016 0508   CO2 24 05/20/2016 1416   BUN 6.9 (L) 05/20/2016 1416   CREATININE 0.6 (L) 05/20/2016 1416      Component Value Date/Time   CALCIUM 8.7 05/20/2016 1416   ALKPHOS 81 05/20/2016 1416   AST 18 05/20/2016 1416   ALT 15 05/20/2016 1416   BILITOT <0.22 05/20/2016 1416       RADIOGRAPHIC STUDIES: Dg Chest 2 View  Result Date: 05/09/2016 CLINICAL DATA:  Chest pain and weight loss EXAM: CHEST  2 VIEW COMPARISON:  Chest radiograph April 06, 2016 and chest CT April 28, 2016 FINDINGS: There is stable mild blunting of the left costophrenic angle. There is no edema or consolidation. Nodular opacity adjacent to the aortic arch on the left is appreciable by radiography but better seen by CT. Heart size is normal. The pulmonary vascularity is normal. There is left hilar adenopathy, stable. No new adenopathy evident. No bone lesions. Coils are noted in the upper abdomen on the left. There are multiple calcified splenic granulomas. There is aortic atherosclerosis. IMPRESSION: Persistent left hilar adenopathy. Stable nodular opacity adjacent to the aortic  arch on the left. No edema or consolidation. Mild scarring lateral left base. Aortic atherosclerosis. Calcified splenic granulomas present. Electronically Signed   By: Lowella Grip III M.D.   On: 05/09/2016 11:26   Dg Abdomen 1 View  Result Date: 05/16/2016 CLINICAL DATA:  G-tube placement.  Initial encounter. EXAM: ABDOMEN - 1 VIEW COMPARISON:  CT of the abdomen and pelvis performed 05/09/2016 FINDINGS: The patient's G-tube is noted overlying the lesser curvature of the stomach. Lack of contrast about the G-tube is thought to reflect injection of water. Injected contrast is seen filling the stomach as expected. Coils are seen at the left upper quadrant. The  visualized bowel gas pattern is grossly unremarkable. No free intra-abdominal air is seen, though evaluation for free air is limited on a single supine view. IMPRESSION: Injected contrast noted filling the stomach as expected. Electronically Signed   By: Roanna Raider M.D.   On: 05/16/2016 20:54   Ct Chest Wo Contrast  Result Date: 04/28/2016 CLINICAL DATA:  Smoker for 5 years. Chest pain. Abnormal chest x-ray. EXAM: CT CHEST WITHOUT CONTRAST TECHNIQUE: Multidetector CT imaging of the chest was performed following the standard protocol without IV contrast. COMPARISON:  None. FINDINGS: Cardiovascular: No significant vascular findings. Normal heart size. No pericardial effusion. Coronary artery atherosclerosis in the LAD. Mediastinum/Nodes: Enlarge AP window lymph node measuring 2 cm in short axis. Ill-defined left hilar lymphadenopathy. Thyroid gland, trachea, and esophagus demonstrate no significant findings. Lungs/Pleura: No pleural effusion or pneumothorax. Chronic left basilar pleural thickening. 1.4 x 1.7 cm nodule along the left lateral margin of the aortic arch. No other pulmonary nodule or soft tissue mass. Bilateral mild centrilobular emphysema. No pleural effusion or pneumothorax. Upper Abdomen: Gastrostomy tube in satisfactory position.  Calcifications in the pancreas as can be seen with chronic pancreatitis or prior granulomatous disease. Calcifications noted in the liver and spleen likely secondary to prior granulomatous disease. Musculoskeletal: No acute osseous abnormality. No lytic or sclerotic osseous lesion. IMPRESSION: 1. 1.4 x 1.7 cm nodule along the left lateral margin of the aortic large. The nodule is isodense with the aorta. Left hilar and AP window lymphadenopathy. Overall appearance is most concerning for primary lung malignancy. CT of the chest with intravenous contrast is recommended to completely exclude an aneurysm. These results will be called to the ordering clinician or representative by the Radiologist Assistant, and communication documented in the PACS or zVision Dashboard. Electronically Signed   By: Elige Ko   On: 04/28/2016 15:33   Ct Abdomen Pelvis W Contrast  Result Date: 05/09/2016 CLINICAL DATA:  Hemoptysis and abdominal pain over the last 2 days. Recent weight loss. EXAM: CT ABDOMEN AND PELVIS WITH CONTRAST TECHNIQUE: Multidetector CT imaging of the abdomen and pelvis was performed using the standard protocol following bolus administration of intravenous contrast. CONTRAST:  60mL ISOVUE-300 IOPAMIDOL (ISOVUE-300) INJECTION 61% COMPARISON:  11/29/2014. FINDINGS: Lower chest: Lung bases are clear.  No pleural or pericardial fluid. Hepatobiliary: Multiple scattered hepatic granulomas as seen previously. No acute liver finding. No calcified gallstones. Pancreas: Chronic pancreatic calcifications. There appears to be pronounced active inflammation in the lesser sac with a 6-7 cm phlegmonous inflammatory mass with a central low density measuring about 2 cm that could represent actual abscess. This indents the posterior wall of the stomach. Spleen: Multiple calcified granulomas.  Otherwise unremarkable. Adrenals/Urinary Tract: Adrenal glands are normal. Kidneys are normal. Stomach/Bowel: Gastrostomy tube in the  stomach. No evidence of bowel obstruction or primary bowel pathology. Vascular/Lymphatic: Aortic atherosclerosis.  No aneurysm. Reproductive: Negative Other: No free fluid or air. Musculoskeletal: Negative IMPRESSION: Acute inflammation in the lesser sac region, likely secondary to chronic pancreatitis. Phlegmonous inflammatory mass measuring approximately 7 cm in diameter. Central nonenhancing region measuring about 2 cm in diameter that could represent a central abscess. Electronically Signed   By: Paulina Fusi M.D.   On: 05/09/2016 15:36    ASSESSMENT: This is a 56 years old cachectic white male with multiple medical problems including acute on chronic pancreatitis as well as severe calorie malnutrition and several other medical problem who was found on recent imaging studies to have suspicious nodules in the lateral aspect of  the aortic arch with suspicious left hilar and AP window lymphadenopathy concerning for lung cancer. CBC performed earlier today showed severe anemia with hemoglobin of 5.8. The patient denied having any bleeding issues.  PLAN: I had a lengthy discussion with the patient and his girlfriend about his current condition and further investigation to confirm a diagnosis of lung cancer. I would arrange for the patient to have a PET scan performed in the next 2 weeks for further evaluation of the lung lesion. For the critical low hemoglobin of 5.8, and I strongly recommended for the patient to immediately to the emergency department for evaluation and consideration of admission and transfusion. The patient was wheelchaired to go to the emergency department and on his way there, he got off the wheelchair and decided to go to his car to pick up something from the car [employer]. He did not come back to be evaluated. We will call the patient and strongly recommended for him to go immediately to the emergency department for evaluation because of the seriousness of his  condition. I will arrange for him to come back for follow-up visit in 2-3 weeks after the PET scan for more detailed discussion of his condition and treatment options. The patient voices understanding of current disease status and treatment options and is in agreement with the current care plan.  All questions were answered. The patient knows to call the clinic with any problems, questions or concerns. We can certainly see the patient much sooner if necessary.  Thank you so much for allowing me to participate in the care of [place]. I will continue to follow up the patient with you and assist in his care.  I spent 40 minutes counseling the patient face to face. The total time spent in the appointment was 60 minutes.  Disclaimer: This note was dictated with voice recognition software. Similar sounding words can inadvertently be transcribed and may not be corrected upon review.   Enos Muhl K. May 20, 2016, 3:11 PM

## 2016-05-20 NOTE — Progress Notes (Signed)
Per Julien Nordmann pt needs to go to ED for low hgb. I called ED and the ED charge RN recommended Dr Julien Nordmann do direct admit or call hospitalist.  Pt was told a nurse was  going to take him to the hospital for low hgb.He got in the wheelchair and was being transported to the hospital. The nurse returned and stated pt got up out of wheelchair and he and his fiance left the building . Dr Julien Nordmann notified.

## 2016-05-20 NOTE — Progress Notes (Signed)
Called patient significant other, and she states that patient is currently in the ER waiting to be evaluated.

## 2016-05-20 NOTE — ED Triage Notes (Addendum)
Pt sent by PCP for Hgb 5.8, was at PCP for pancreatitis hospital followup. Pt reports dizziness, worsened chest pain. Lost 50 pounds in past month. No blood in stool, no melena. Reports hematemesis x several days, stopped yesterday, blood was bright and dark.

## 2016-05-20 NOTE — ED Provider Notes (Signed)
[facility] DEPT Provider Note   CSN: [id number] Arrival date & time: 05/20/16  1538     History   Chief Complaint Chief Complaint  Patient presents with  . Anemia    Hgb 5.8    HPI Marcus Brooks is a 56 y.o. male.  56 year old male with history of chronic pancreatitis, recent admission for a phlegmon, presents with his doctor's office due to increased weakness as well as worsening chronic abdominal pain. Patient had blood work done today which showed a hemoglobin of 5.8. Patient states that 2 days ago he started having hematemesis which is since subsided. Denies any black stools. Does have a prior history of alcohol abuse but denies any history of varices. Weakness is worse when he stands up. Denies any fever or chills. Was sent to for further management.      Past Medical History:  Diagnosis Date  . Anxiety states 08/04/2010  . Chronic airway obstruction, not elsewhere classified 08/04/2010  . Chronic pain syndrome 08/04/2010  . Chronic pancreatitis ([place]) 08/04/2010  . Coronary artery disease   . GERD (gastroesophageal reflux disease)   . Hiatal hernia    EGD 01/18/13  . Insomnia 08/04/2010  . Intellectual disability   . Lung nodule 05/20/2016  . Other abnormal glucose 08/04/2010  . Pancreatitis chronic 08/04/2010  . Protein calorie malnutrition ([place]) 2010  . Seizure ([place]) 08/04/2010  . Severe anemia 05/20/2016  . Splenic vein thrombosis    Chronic.  . Tobacco use disorder 08/04/2010  . Unspecified essential hypertension 08/04/2010    Patient Active Problem List   Diagnosis Date Noted  . Lung nodule 05/20/2016  . Severe anemia 05/20/2016  . Pancreatic lesion   . Malnutrition ([place])   . Phlegmon of pancreas 05/09/2016  . Acute on chronic pancreatitis ([place]) 05/09/2016  . Lymphadenopathy 05/09/2016  . Hx of colonic polyps 04/23/2016  . Atherosclerosis of aorta ([place]) 04/09/2016  . History of colonic polyps 02/19/2016  . Dizziness 06/10/2015  . Pancreatic  pseudocyst 03/13/2013  . Cellulitis 01/24/2013  . Gastritis 01/24/2013  . Pancreatitis 01/23/2013  . Irritation around percutaneous endoscopic gastrostomy (PEG) tube site ([place]) 01/23/2013  . Hyponatremia 01/19/2013  . Hiatal hernia 01/19/2013  . Acute blood loss anemia 01/18/2013  . 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 Coronary artery disease   . Upper GI bleed 01/17/2013  . Chronic pancreatitis ([place]) 01/17/2013  . Protein-calorie malnutrition, severe ([place]) 01/10/2013  . Hematemesis 01/09/2013  . Pancreatitis, acute 01/09/2013  . Seizure disorder ([place]) 01/09/2013  . Syncope 01/09/2013  . Acute pancreatitis 10/03/2012  . Leukocytosis 10/03/2012  . Transaminitis 10/03/2012  . Chest pain 10/03/2012  . Pain around PEG tube site 03/09/2011  . Hypertension 11/05/2010  . GERD (gastroesophageal reflux disease) 11/05/2010  . Chronic pain syndrome 08/04/2010  . Tobacco use disorder 08/04/2010    Past Surgical History:  Procedure Laterality Date  . CORONARY ANGIOPLASTY WITH STENT PLACEMENT    . ESOPHAGOGASTRODUODENOSCOPY N/A 01/18/2013   Procedure: ESOPHAGOGASTRODUODENOSCOPY (EGD);  Surgeon: Rogene Houston, MD;  [facility];  Service: Endoscopy;  Laterality: N/A;  . EUS  03/26/2009   [facility]  . GASTROSTOMY-JEJEUNOSTOMY TUBE CHANGE/PLACEMENT  12/11/2008   [facility]  . I&D EXTREMITY Left 09/12/2012   Procedure: IRRIGATION AND DEBRIDEMENT LEFT FIFTH FINGER;  Surgeon: Roseanne Kaufman, MD;  Location: [facility];  Service: Orthopedics;  Laterality: Left;  . JEJUNOSTOMY FEEDING TUBE  07/13/2010  . NERVE AND TENDON REPAIR Left 09/12/2012   Procedure: NERVE AND TENDON  REPAIR LEFT FIFTH FINGER;  Surgeon: Roseanne Kaufman, MD;  Location: [facility];  Service: Orthopedics;  Laterality: Left;  . OPEN REDUCTION INTERNAL FIXATION (ORIF) METACARPAL Left 09/12/2012   Procedure: OPEN REDUCTION INTERNAL FIXATION LEFT FIFTH FINGER;  Surgeon: Roseanne Kaufman, MD;  Location: [facility];  Service: Orthopedics;   Laterality: Left;  . PANCREATIC PSEUDOCYST DRAINAGE  07/13/2010  . PORTACATH PLACEMENT  11/28/2008   [facility]MAN       Home Medications    Prior to Admission medications   Medication Sig Start Date End Date Taking? Authorizing Provider  albuterol (PROVENTIL HFA;VENTOLIN HFA) 108 (90 BASE) MCG/ACT inhaler Inhale 2 puffs into the lungs every 6 (six) hours as needed for wheezing or shortness of breath.    Yes [provider]  ampicillin-sulbactam (UNASYN) IVPB Inject 1.5 g into the vein every 8 (eight) hours. Indication:  Acute on chronic pancreatitis with phlegmon Last Day of Therapy:  05/30/2016 Labs - Once weekly:  CBC/D and BMP, Labs - Every other week:  ESR and CRP 05/15/16  Yes Eugenie Filler, MD  CREON [id number] units CPEP TAKE TWO CAPSULES BY MOUTH BEFORE MEALS 04/15/16  Yes [name], FNP  DULoxetine (CYMBALTA) 30 MG capsule Take 1 capsule (30 mg total) by mouth 2 (two) times daily. 04/06/16  Yes [name], FNP  ferrous sulfate 325 (65 FE) MG tablet TAKE ONE (1) TABLET BY MOUTH EVERY DAY WITH BREAKFAST Patient taking differently: Take 325 mg by mouth daily with breakfast. TAKE ONE (1) TABLET BY MOUTH EVERY DAY WITH BREAKFAST 02/12/16  Yes Micheline Chapman, NP  gabapentin (NEURONTIN) 300 MG capsule Take 1 capsule (300 mg total) by mouth 4 (four) times daily as needed. Patient taking differently: Take 600 mg by mouth 3 (three) times daily.  01/28/16  Yes [name], FNP  hydrocortisone cream 0.5 % Apply 1 application topically 2 (two) times daily. 02/28/16  Yes [name], FNP  mirtazapine (REMERON) 15 MG tablet TAKE ONE TABLET BY MOUTH EVERY EVENING 05/14/16  Yes [name], FNP  Nutritional Supplements (FEEDING SUPPLEMENT, VITAL AF 1.2 CAL,) LIQD Place 1,000 mLs into feeding tube daily. At 36m/hr 05/16/16  Yes TEugenie Filler MD  ondansetron (ZOFRAN) 4 MG tablet Take 1 tablet (4 mg total) by mouth every 6 (six) hours as needed for nausea.  05/15/16  Yes TEugenie Filler MD  Oxycodone HCl 10 MG TABS Take 1 tablet (10 mg total) by mouth every 6 (six) hours as needed. 05/15/16  Yes TEugenie Filler MD  pantoprazole (PROTONIX) 40 MG tablet TAKE ONE (1) TABLET BY MOUTH EVERY DAY 04/15/16  Yes HDorena Dew FNP  phenytoin (DILANTIN) 100 MG ER capsule TAKE THREE CAPSULES BY MOUTH DAILY 04/15/16  Yes HDorena Dew FNP  promethazine (PHENERGAN) 25 MG tablet Take 25 mg by mouth every 6 (six) hours as needed for nausea or vomiting.    Yes [provider]  ranitidine (ZANTAC) 150 MG capsule TAKE 1 CAPSULE BY MOUTH TWICE DAILY Patient taking differently: TAKE 1 CAPSULE BY MOUTH AT BEDTIME 06/13/15  Yes BMicheline Chapman NP  vitamin C (ASCORBIC ACID) 500 MG tablet Take 1 tablet (500 mg total) by mouth daily. 11/20/15  Yes BMicheline Chapman NP  Water For Irrigation, Sterile (FREE WATER) SOLN Place 200 mLs into feeding tube every 8 (eight) hours. 05/15/16  Yes TEugenie Filler MD  Adhesive Tape ([facility] LPADHESIVE SURG 1/2"X10YD) TAPE 1 each by Does not apply route  2 (two) times daily as needed. 03/09/16   [name], FNP  Gauze Pads & Dressings (BIOGUARD GAUZE SPONGES) 4"X4" PADS 1 each by Does not apply route 2 (two) times daily. 03/09/16   [name], FNP    Family History No family history on file.  Social History Social History  Substance Use Topics  . Smoking status: Current Every Day Smoker    Packs/day: 1.00    Years: 20.00    Types: Cigarettes  . Smokeless tobacco: Never Used     Comment: 1 pack a day since age 20  . Alcohol use No     Comment: No etoh since 5-6 yrs     Allergies   Latex   Review of Systems Review of Systems  All other systems reviewed and are negative.    Physical Exam Updated Vital Signs BP 109/73   Pulse 63   Temp 98.2 F (36.8 C) (Oral)   Resp 16   Ht _0  (1.702 m)   Wt 45.4 kg   SpO2 100%   BMI 15.66 kg/m   Physical Exam  Constitutional: He is oriented to  person, place, and time. He appears cachectic.  Non-toxic appearance. No distress.  HENT:  Head: Normocephalic and atraumatic.  Eyes: Conjunctivae, EOM and lids are normal. Pupils are equal, round, and reactive to light.  Neck: Normal range of motion. Neck supple. No tracheal deviation present. No thyroid mass present.  Cardiovascular: Normal rate, regular rhythm and normal heart sounds.  Exam reveals no gallop.   No murmur heard. Pulmonary/Chest: Effort normal and breath sounds normal. No stridor. No respiratory distress. He has no decreased breath sounds. He has no wheezes. He has no rhonchi. He has no rales.  Abdominal: Soft. Normal appearance and bowel sounds are normal. He exhibits no distension. There is no tenderness. There is no rebound and no CVA tenderness.  Musculoskeletal: Normal range of motion. He exhibits no edema or tenderness.  Neurological: He is alert and oriented to person, place, and time. He has normal strength. No cranial nerve deficit or sensory deficit. GCS eye subscore is 4. GCS verbal subscore is 5. GCS motor subscore is 6.  Skin: No abrasion and no rash noted. There is pallor.  Psychiatric: He has a normal mood and affect. His speech is normal and behavior is normal.  Nursing note and vitals reviewed.    ED Treatments / Results  Labs (all labs ordered are listed, but only abnormal results are displayed) Labs Reviewed  BASIC METABOLIC PANEL  CBC  LIPASE, BLOOD  I-STAT [place], ED  POC OCCULT BLOOD, ED  POC OCCULT BLOOD, ED  TYPE AND SCREEN  PREPARE RBC (CROSSMATCH)    EKG  EKG Interpretation None       Radiology No results found.  Procedures Procedures (including critical care time)  Medications Ordered in ED Medications  0.9 %  sodium chloride infusion (not administered)  HYDROmorphone (DILAUDID) injection 1 mg (not administered)  ondansetron (ZOFRAN) injection 4 mg (not administered)     Initial Impression / Assessment and Plan / ED  Course  I have reviewed the triage vital signs and the nursing notes.  Pertinent labs & imaging results that were available during my care of the patient were reviewed by me and considered in my medical decision making (see chart for details).     Patient will be transfused 2 units of packed red blood cells here. Will consult GI and admitted to medicine service  Final  Clinical Impressions(s) / ED Diagnoses   Final diagnoses:  None    New Prescriptions New Prescriptions   No medications on file     Lacretia Leigh, MD 05/20/16 1721

## 2016-05-20 NOTE — Telephone Encounter (Signed)
Scheduled appt per 5/9 los. Left message with appt time and date . Central radiology to contact patient with PET schedule.

## 2016-05-20 NOTE — Patient Instructions (Signed)
Steps to Quit Smoking Smoking tobacco can be bad for your health. It can also affect almost every organ in your body. Smoking puts you and people around you at risk for many serious long-lasting (chronic) diseases. Quitting smoking is hard, but it is one of the best things that you can do for your health. It is never too late to quit. What are the benefits of quitting smoking? When you quit smoking, you lower your risk for getting serious diseases and conditions. They can include:  Lung cancer or lung disease.  Heart disease.  Stroke.  Heart attack.  Not being able to have children (infertility).  Weak bones (osteoporosis) and broken bones (fractures). If you have coughing, wheezing, and shortness of breath, those symptoms may get better when you quit. You may also get sick less often. If you are pregnant, quitting smoking can help to lower your chances of having a baby of low birth weight. What can I do to help me quit smoking? Talk with your doctor about what can help you quit smoking. Some things you can do (strategies) include:  Quitting smoking totally, instead of slowly cutting back how much you smoke over a period of time.  Going to in-person counseling. You are more likely to quit if you go to many counseling sessions.  Using resources and support systems, such as:  Online chats with a counselor.  Phone quitlines.  Printed self-help materials.  Support groups or group counseling.  Text messaging programs.  Mobile phone apps or applications.  Taking medicines. Some of these medicines may have nicotine in them. If you are pregnant or breastfeeding, do not take any medicines to quit smoking unless your doctor says it is okay. Talk with your doctor about counseling or other things that can help you. Talk with your doctor about using more than one strategy at the same time, such as taking medicines while you are also going to in-person counseling. This can help make quitting  easier. What things can I do to make it easier to quit? Quitting smoking might feel very hard at first, but there is a lot that you can do to make it easier. Take these steps:  Talk to your family and friends. Ask them to support and encourage you.  Call phone quitlines, reach out to support groups, or work with a counselor.  Ask people who smoke to not smoke around you.  Avoid places that make you want (trigger) to smoke, such as:  Bars.  Parties.  Smoke-break areas at work.  Spend time with people who do not smoke.  Lower the stress in your life. Stress can make you want to smoke. Try these things to help your stress:  Getting regular exercise.  Deep-breathing exercises.  Yoga.  Meditating.  Doing a body scan. To do this, close your eyes, focus on one area of your body at a time from head to toe, and notice which parts of your body are tense. Try to relax the muscles in those areas.  Download or buy apps on your mobile phone or tablet that can help you stick to your quit plan. There are many free apps, such as QuitGuide from the CDC (Centers for Disease Control and Prevention). You can find more support from smokefree.gov and other websites. This information is not intended to replace advice given to you by your health care provider. Make sure you discuss any questions you have with your health care provider. Document Released: 10/25/2008 Document Revised: 08/27/2015 Document   Reviewed: 05/15/2014 [employer]vier Interactive Patient Education  2017 [employer]  

## 2016-05-20 NOTE — H&P (Signed)
History and Physical    Marcus Brooks DGU:[id number] DOB: 14-Jan-1960 DOA: 05/20/2016  PCP: [name], FNP   Patient coming from: Home, by way of cancer center  Chief Complaint: Hematemesis, lightheadedness on standing, fatigue, chest pain   HPI: Marcus Brooks is a 56 y.o. male with medical history significant for remote alcohol abuse, ongoing tobacco abuse, chronic pancreatitis with recent development of phlegmon and currently on antibiotics, protein calorie malnutrition, chronic pain, GERD, CAD, and suspicious lung nodule who presents to the emergency department at the direction of his oncologist for the aforementioned symptoms and a hemoglobin of 5.8. Patient was recently admitted to the hospital, discharged on 05/15/2016 after management of acute on chronic pancreatitis with phlegmon noted on CT. He was started on antibiotics for this, initially was requiring TPN, and was discharged with a PICC to complete a course of Unasyn. He was noted to have a lung nodule on chest x-ray during that admission, this was followed by CT chest which also demonstrated some concerning adenopathy, and he was seen by oncology today for his first consultation. Lab work was obtained and he was noted to have a hemoglobin of 5.8 and directed to the ED for further evaluation of this. Patient reports that he had been experiencing hematemesis for several days with both dark and bright blood, but notes that he has not vomited in more than 24 hours now. There was upper abdominal pain, left of center, associated with this, and the pain continues. Patient reports undergoing EGD remotely, but denies any known ulcer disease or varices. He denies melena or hematochezia and has never experienced similar symptoms previously. Patient also describes some constant, aching chest pain, moderate in intensity, and without any alleviating or exacerbating factors identified. This seems to be chronic, but has worsened. He denies any increase  in his chronic dyspnea or cough.  ED Course: Upon arrival to the ED, patient is found to be afebrile, saturating well on room air, and with vitals otherwise stable. EKG features a sinus rhythm with RSR' in V2. Chest x-ray is negative for acute pulmonary disease. Chemistry panel is notable for serum albumin of 2.7 and CBC features a hemoglobin of 8.3, down from 5.8 earlier in the day, but consistent with hemoglobin of 8.3 on 05/15/2016. Fecal occult blood testing was performed in the ED and negative. 2 units of packed red blood cells have been ordered by the ED physician and transfusion was begun despite the hemoglobin of 8.3 due to the patient's symptoms and pallor. He remained hemodynamically stable in the ED and has not been in any acute respiratory distress. He will be admitted to the telemetry unit for ongoing evaluation and management of hematemesis, possible symptomatic anemia.   Review of Systems:  All other systems reviewed and apart from HPI, are negative.  Past Medical History:  Diagnosis Date  . Anxiety states 08/04/2010  . Chronic airway obstruction, not elsewhere classified 08/04/2010  . Chronic pain syndrome 08/04/2010  . Chronic pancreatitis ([place]) 08/04/2010  . Coronary artery disease   . GERD (gastroesophageal reflux disease)   . Hiatal hernia    EGD 01/18/13  . Insomnia 08/04/2010  . Intellectual disability   . Lung nodule 05/20/2016  . Other abnormal glucose 08/04/2010  . Pancreatitis chronic 08/04/2010  . Protein calorie malnutrition ([place]) 2010  . Seizure ([place]) 08/04/2010  . Severe anemia 05/20/2016  . Splenic vein thrombosis    Chronic.  . Tobacco use disorder 08/04/2010  . Unspecified essential hypertension  08/04/2010    Past Surgical History:  Procedure Laterality Date  . CORONARY ANGIOPLASTY WITH STENT PLACEMENT    . ESOPHAGOGASTRODUODENOSCOPY N/A 01/18/2013   Procedure: ESOPHAGOGASTRODUODENOSCOPY (EGD);  Surgeon: Rogene Houston, MD;  [facility];   Service: Endoscopy;  Laterality: N/A;  . EUS  03/26/2009   [facility]  . GASTROSTOMY-JEJEUNOSTOMY TUBE CHANGE/PLACEMENT  12/11/2008   [facility]  . I&D EXTREMITY Left 09/12/2012   Procedure: IRRIGATION AND DEBRIDEMENT LEFT FIFTH FINGER;  Surgeon: Roseanne Kaufman, MD;  Location: [place];  Service: Orthopedics;  Laterality: Left;  . JEJUNOSTOMY FEEDING TUBE  07/13/2010  . NERVE AND TENDON REPAIR Left 09/12/2012   Procedure: NERVE AND TENDON REPAIR LEFT FIFTH FINGER;  Surgeon: Roseanne Kaufman, MD;  Location: [place];  Service: Orthopedics;  Laterality: Left;  . OPEN REDUCTION INTERNAL FIXATION (ORIF) METACARPAL Left 09/12/2012   Procedure: OPEN REDUCTION INTERNAL FIXATION LEFT FIFTH FINGER;  Surgeon: Roseanne Kaufman, MD;  Location: [place];  Service: Orthopedics;  Laterality: Left;  . PANCREATIC PSEUDOCYST DRAINAGE  07/13/2010  . PORTACATH PLACEMENT  11/28/2008   FLEISHMAN     reports that he has been smoking Cigarettes.  He has a 20.00 pack-year smoking history. He has never used smokeless tobacco. He reports that he does not drink alcohol or use drugs.  Allergies  Allergen Reactions  . Latex Rash    History reviewed. No pertinent family history.   Prior to Admission medications   Medication Sig Start Date End Date Taking? Authorizing Provider  albuterol (PROVENTIL HFA;VENTOLIN HFA) 108 (90 BASE) MCG/ACT inhaler Inhale 2 puffs into the lungs every 6 (six) hours as needed for wheezing or shortness of breath.    Yes [provider]  ampicillin-sulbactam (UNASYN) IVPB Inject 1.5 g into the vein every 8 (eight) hours. Indication:  Acute on chronic pancreatitis with phlegmon Last Day of Therapy:  05/30/2016 Labs - Once weekly:  CBC/D and BMP, Labs - Every other week:  ESR and CRP 05/15/16  Yes Eugenie Filler, MD  CREON [phone] units CPEP TAKE TWO CAPSULES BY MOUTH BEFORE MEALS 04/15/16  Yes [name], FNP  DULoxetine (CYMBALTA) 30 MG capsule Take 1 capsule (30 mg total) by mouth  2 (two) times daily. 04/06/16  Yes [name], FNP  ferrous sulfate 325 (65 FE) MG tablet TAKE ONE (1) TABLET BY MOUTH EVERY DAY WITH BREAKFAST Patient taking differently: Take 325 mg by mouth daily with breakfast. TAKE ONE (1) TABLET BY MOUTH EVERY DAY WITH BREAKFAST 02/12/16  Yes Micheline Chapman, NP  gabapentin (NEURONTIN) 300 MG capsule Take 1 capsule (300 mg total) by mouth 4 (four) times daily as needed. Patient taking differently: Take 600 mg by mouth 3 (three) times daily.  01/28/16  Yes [name], FNP  hydrocortisone cream 0.5 % Apply 1 application topically 2 (two) times daily. 02/28/16  Yes [name], FNP  mirtazapine (REMERON) 15 MG tablet TAKE ONE TABLET BY MOUTH EVERY EVENING 05/14/16  Yes [name], FNP  Nutritional Supplements (FEEDING SUPPLEMENT, VITAL AF 1.2 CAL,) LIQD Place 1,000 mLs into feeding tube daily. At 52m/hr 05/16/16  Yes TEugenie Filler MD  ondansetron (ZOFRAN) 4 MG tablet Take 1 tablet (4 mg total) by mouth every 6 (six) hours as needed for nausea. 05/15/16  Yes TEugenie Filler MD  Oxycodone HCl 10 MG TABS Take 1 tablet (10 mg total) by mouth every 6 (six) hours as needed. 05/15/16  Yes TEugenie Filler MD  pantoprazole (PROTONIX) 40 MG tablet TAKE ONE (1) TABLET BY MOUTH EVERY DAY 04/15/16  Yes [name], FNP  phenytoin (DILANTIN) 100 MG ER capsule TAKE THREE CAPSULES BY MOUTH DAILY 04/15/16  Yes [name], FNP  promethazine (PHENERGAN) 25 MG tablet Take 25 mg by mouth every 6 (six) hours as needed for nausea or vomiting.    Yes [provider]  ranitidine (ZANTAC) 150 MG capsule TAKE 1 CAPSULE BY MOUTH TWICE DAILY Patient taking differently: TAKE 1 CAPSULE BY MOUTH AT BEDTIME 06/13/15  Yes Micheline Chapman, NP  vitamin C (ASCORBIC ACID) 500 MG tablet Take 1 tablet (500 mg total) by mouth daily. 11/20/15  Yes Micheline Chapman, NP  Water For Irrigation, Sterile (FREE WATER) SOLN Place 200 mLs into feeding tube every  8 (eight) hours. 05/15/16  Yes Eugenie Filler, MD  Adhesive Tape [facility] ADHESIVE SURG 1/2"X10YD) TAPE 1 each by Does not apply route 2 (two) times daily as needed. 03/09/16   [name], FNP  Gauze Pads & Dressings (BIOGUARD GAUZE SPONGES) 4"X4" PADS 1 each by Does not apply route 2 (two) times daily. 03/09/16   [name], FNP    Physical Exam: Vitals:   05/20/16 1552 05/20/16 1827 05/20/16 1834 05/20/16 1901  BP: 109/73 114/68  (!) 112/58  Pulse: 63 (!) 57  (!) 55  Resp: '16 12  15  '$ Temp: 98.2 F (36.8 C)  97.9 F (36.6 C)   TempSrc: Oral  Oral   SpO2: 100% 100%  98%  Weight: 45.4 kg (100 lb)     Height: '5\' 7"'$  (1.702 m)         Constitutional: No acute distress. Cachectic, pale.  Eyes: PERTLA, lids and conjunctivae normal ENMT: Mucous membranes are moist. Posterior pharynx clear of any exudate or lesions.   Neck: normal, supple, no masses, no thyromegaly Respiratory: clear to auscultation bilaterally, no wheezing, no crackles. No accessory muscle use.  Cardiovascular: S1 & S2 heard, regular rate and rhythm. No significant JVD. Abdomen: No distension, soft, mild tenderness in epigastrium and LUQ, no rebound pain or guarding. Bowel sounds active. Musculoskeletal:  No joint deformity upper and lower extremities. No red, hot, swollen joint.  Skin: no significant rashes, lesions, ulcers. Warm, dry, well-perfused. Pale, poor turgor. Neurologic: CN 2-12 grossly intact. Sensation intact, DTR normal. Strength 5/5 in all 4 limbs.  Psychiatric: Alert and oriented x 3. Normal mood and affect.     Labs on Admission: I have personally reviewed following labs and imaging studies  CBC:  Recent Labs Lab 05/14/16 0508 05/15/16 0508 05/20/16 1416 05/20/16 1643  WBC 7.9 6.9 9.4 7.2  NEUTROABS  --  4.5 6.0  --   HGB 8.6* 8.3* 5.8* 8.3*  HCT 25.8* 25.2* 17.4* 24.7*  MCV 93.8 93.7 97.2 96.5  PLT 137* 139* 293 053   Basic Metabolic Panel:  Recent Labs Lab 05/14/16 0508  05/15/16 0508 05/20/16 1416 05/20/16 1643  NA 133* 136 137 135  K 4.2 4.0 3.9 3.6  CL 100* 100*  --  105  CO2 '29 29 24 24  '$ GLUCOSE 124* 117* 97 89  BUN 5* 5* 6.9* 8  CREATININE 0.46* 0.42* 0.6* 0.41*  CALCIUM 8.2* 8.2* 8.7 8.4*  MG 1.8 2.0  --   --   PHOS 2.8 3.4  --   --    GFR: Estimated Creatinine Clearance: 67 mL/min (A) (by C-G formula based on SCr of 0.41 mg/dL (L)). Liver Function Tests:  Recent Labs Lab 05/14/16 0508 05/15/16 0508 05/20/16 1416  AST '17 16 18  '$ ALT 11* 9* 15  ALKPHOS 80 78 81  BILITOT 0.1* 0.2* <0.22  PROT 5.4* 5.5* 6.7  ALBUMIN 2.5* 2.5* 2.7*    Recent Labs Lab 05/20/16 1718  LIPASE 25   No results for input(s): AMMONIA in the last 168 hours. Coagulation Profile: No results for input(s): INR, PROTIME in the last 168 hours. Cardiac Enzymes: No results for input(s): CKTOTAL, CKMB, CKMBINDEX, TROPONINI in the last 168 hours. BNP (last 3 results) No results for input(s): PROBNP in the last 8760 hours. HbA1C: No results for input(s): HGBA1C in the last 72 hours. CBG:  Recent Labs Lab 05/14/16 2052 05/15/16 0029 05/15/16 0408 05/15/16 0801 05/15/16 1224  GLUCAP 94 124* 129* 124* 106*   Lipid Profile: No results for input(s): CHOL, HDL, LDLCALC, TRIG, CHOLHDL, LDLDIRECT in the last 72 hours. Thyroid Function Tests: No results for input(s): TSH, T4TOTAL, FREET4, T3FREE, THYROIDAB in the last 72 hours. Anemia Panel: No results for input(s): VITAMINB12, FOLATE, FERRITIN, TIBC, IRON, RETICCTPCT in the last 72 hours. Urine analysis:    Component Value Date/Time   COLORURINE AMBER (A) 05/09/2016 1258   APPEARANCEUR CLEAR 05/09/2016 1258   LABSPEC 1.024 05/09/2016 1258   PHURINE 7.0 05/09/2016 1258   GLUCOSEU NEGATIVE 05/09/2016 1258   HGBUR NEGATIVE 05/09/2016 1258   BILIRUBINUR SMALL (A) 05/09/2016 1258   KETONESUR NEGATIVE 05/09/2016 1258   PROTEINUR 100 (A) 05/09/2016 1258   UROBILINOGEN 2.0 (H) 04/06/2016 1526   NITRITE  NEGATIVE 05/09/2016 1258   LEUKOCYTESUR NEGATIVE 05/09/2016 1258   Sepsis Labs: '@LABRCNTIP'$ (procalcitonin:4,lacticidven:4) )No results found for this or any previous visit (from the past 240 hour(s)).   Radiological Exams on Admission: Dg Chest 2 View  Result Date: 05/20/2016 CLINICAL DATA:  Chest pain EXAM: CHEST  2 VIEW COMPARISON:  05/09/2016 FINDINGS: Right upper extremity PICC with tip at the SVC level. There is no edema, consolidation, effusion, or pneumothorax. Normal heart size and mediastinal contours. Mild hyperinflation in this patient with history of COPD. Embolization coils over the epigastrium. IMPRESSION: No evidence of acute disease. Electronically Signed   By: Monte Fantasia M.D.   On: 05/20/2016 17:32    EKG: Independently reviewed. Sinus rhythm, RSR' in V2.   Assessment/Plan  1. Symptomatic anemia, hematemesis  - Pt presents at direction of oncologist for evaluation of pallor, fatigue, lightheadedness on standing, and Hgb 5.8  - Pt reports several days of hematemesis and upper abd pain with no emesis in at least 24 hrs at time of admit, but persistent abd pain  - Hx of remote EtOH abuse, reports quitting 16 yrs ago; EGD from 2015 with mild esophagitis, no varices or PUD   - Pt takes Protonix and Zantac qD at home - 2 units of pRBCs ordered for immediate transfusion in ED, but CBC returned with Hgb 8.3, consistent with the value from 05/15/16  - Plan to complete the first unit that is infusing, but hold the second pending post-transfusion CBC  - Start Protonix infusion, check post-transfusion CBC    2. Phlegmon of pancreas, chronic pancreatitis  - Pt discharged 05/15/16 after acute management of AoC pancreatitis with phlegmon - He has PICC and has been continued on treatment with Unasyn, end-date 5/19, will continue  - No infectious s/s this admission  - Resume Creon once appropriate for a diet    3. Lung nodule  - Pt noted to have concerning lung nodule and adenopathy  during recent admission  - He saw oncology 05/20/16 for first consultation and PET scan is being arranged   4. Malnutrition  - Pt appears cachectic, reports 50-lb wt loss in last year, albumin 2.7 - He has PEG tube, but reports not currently using, but taking a low-fat diet by mouth  - With reported hematemesis, will keep diet restricted to clear liquids initially, nutritionist consultation requested    5. Current smoker  - Pt reports ongoing 1 ppd habit, was counseled toward cessation  - Nicotine patch provided    6. Seizure disorder  - Stable, continue Dilantin  7. CAD, chest pain  - Pt reports atypical, chronic chest pain, but reports it has worsened in recent days  - Initial troponin 0.00 and EKG with no acute ischemic features  - He is being monitored on telemetry; will continue serial troponin measurements given concern for acute anemia in pt with known CAD    8. Chronic pain  - Continue home regimen with Oxycodone 10 mg q6h prn    DVT prophylaxis: SCD's  Code Status: Full  Family Communication: Discussed with patient Disposition Plan: Admit to telemetry Consults called: Gastroenterology  Admission status: Inpatient    Vianne Bulls, MD [facility] Pager [phone]  If 7PM-7AM, please contact night-coverage [email or web address] Password [email or web address]  05/20/2016, 7:06 PM

## 2016-05-21 DIAGNOSIS — K219 Gastro-esophageal reflux disease without esophagitis: Secondary | ICD-10-CM

## 2016-05-21 LAB — COMPREHENSIVE METABOLIC PANEL
ALT: 13 U/L — AB (ref 17–63)
ANION GAP: 3 — AB (ref 5–15)
AST: 18 U/L (ref 15–41)
Albumin: 2.4 g/dL — ABNORMAL LOW (ref 3.5–5.0)
Alkaline Phosphatase: 71 U/L (ref 38–126)
BUN: 6 mg/dL (ref 6–20)
CO2: 24 mmol/L (ref 22–32)
Calcium: 7.9 mg/dL — ABNORMAL LOW (ref 8.9–10.3)
Chloride: 110 mmol/L (ref 101–111)
Creatinine, Ser: 0.44 mg/dL — ABNORMAL LOW (ref 0.61–1.24)
Glucose, Bld: 151 mg/dL — ABNORMAL HIGH (ref 65–99)
Potassium: 3.3 mmol/L — ABNORMAL LOW (ref 3.5–5.1)
Sodium: 137 mmol/L (ref 135–145)
Total Bilirubin: 0.2 mg/dL — ABNORMAL LOW (ref 0.3–1.2)
Total Protein: 5.8 g/dL — ABNORMAL LOW (ref 6.5–8.1)

## 2016-05-21 LAB — CBC WITH DIFFERENTIAL/PLATELET
BASOS ABS: 0.1 10*3/uL (ref 0.0–0.1)
Basophils Relative: 1 %
EOS PCT: 4 %
Eosinophils Absolute: 0.3 10*3/uL (ref 0.0–0.7)
HCT: 26.7 % — ABNORMAL LOW (ref 39.0–52.0)
HEMOGLOBIN: 9.1 g/dL — AB (ref 13.0–17.0)
LYMPHS PCT: 20 %
Lymphs Abs: 1.4 10*3/uL (ref 0.7–4.0)
MCH: 32 pg (ref 26.0–34.0)
MCHC: 34.1 g/dL (ref 30.0–36.0)
MCV: 94 fL (ref 78.0–100.0)
Monocytes Absolute: 0.7 10*3/uL (ref 0.1–1.0)
Monocytes Relative: 10 %
NEUTROS PCT: 65 %
Neutro Abs: 4.5 10*3/uL (ref 1.7–7.7)
PLATELETS: 221 10*3/uL (ref 150–400)
RBC: 2.84 MIL/uL — AB (ref 4.22–5.81)
RDW: 15 % (ref 11.5–15.5)
WBC: 6.9 10*3/uL (ref 4.0–10.5)

## 2016-05-21 LAB — GLUCOSE, CAPILLARY: GLUCOSE-CAPILLARY: 151 mg/dL — AB (ref 65–99)

## 2016-05-21 LAB — ABO/RH: ABO/RH(D): O POS

## 2016-05-21 LAB — TROPONIN I: Troponin I: <0.03 ng/mL (ref <0.03)

## 2016-05-21 MED ORDER — BOOST / RESOURCE BREEZE PO LIQD: 1.0000 | Freq: Three times a day (TID) | ORAL | Status: DC

## 2016-05-21 MED ORDER — SODIUM CHLORIDE 0.9 % IV BOLUS (SEPSIS)
500.0000 mL | Freq: Once | INTRAVENOUS | Status: AC
  Administered 2016-05-21: 500 mL via INTRAVENOUS

## 2016-05-21 MED ORDER — HEPARIN SOD (PORK) LOCK FLUSH 100 UNIT/ML IV SOLN
250.0000 [IU] | INTRAVENOUS | Status: AC | PRN
  Administered 2016-05-21: 250 [IU]

## 2016-05-21 MED ORDER — ENSURE ENLIVE PO LIQD: 237.0000 mL | Freq: Two times a day (BID) | ORAL | Status: DC

## 2016-05-21 NOTE — Progress Notes (Signed)
[facility]  Active pt with [facility] prior to this readmission for [facility] for IV ABX in the home.  [facility] also providing home enteral therapy. [facility] team will follow pt while in the hospital to support transition home when ordered.  If patient discharges after hours, please call [phone].   Larry Sierras 05/21/2016, 10:03 AM

## 2016-05-21 NOTE — Care Management Note (Signed)
Case Management Note  Patient Details  Name: Marcus Brooks MRN: [id number] Date of Birth: 01-23-1960  Subjective/Objective:Patient left AMA. [facility] rep Pam aware-long term iv abx-has picc.                    Action/Plan:left AMA.   Expected Discharge Date:  05/21/16               Expected Discharge Plan:  Against Medical Advice  In-House Referral:     Discharge planning Services     Post Acute Care Choice:  Home Health [facility] iv abx infusion) Choice offered to:     DME Arranged:    DME Agency:     HH Arranged:    HH Agency:     Status of Service:  Completed, signed off  If discussed at [employer], dates discussed:    Additional Comments:  Dessa Phi, RN 05/21/2016, 1:53 PM

## 2016-05-21 NOTE — Progress Notes (Signed)
Pt wanting to leaving he hospital stating " I have things to do." MD at bedside and explained risk of leaving hospital against medical advise. Pt verbalized understanding and still requested to leave AMA. PICC line flushed with heparin by IV team. Writer explained to pt risk of leaving AMA and that he would not receive any prescriptions for home antibiotics or any other medication. Writer explained to pt that he would have to call [facility] and his primary doctor for any medication and home health needs. Pt verbalized understanding and signed AMA paper.

## 2016-05-21 NOTE — Discharge Summary (Signed)
Physician Discharge Summary  Marcus Brooks SPQ:[id number] DOB: 1960/01/23 DOA: 05/20/2016  PCP: Marcus Dew, FNP  Admit date: 05/20/2016 Discharge date: 05/21/2016  Admitted From: Home way of Oncology Office Disposition:  Home; Left Against Medical Advice  Recommendations for Outpatient Follow-up:  1. Follow up with PCP in 1-2 weeks 2. Please obtain CMP/CBC in one week  Home Health: [facility] Equipment/Devices: PICC Line  Discharge Condition: Guarded; LEFT AMA CODE STATUS: FULL CODE Diet recommendation: Clear Liquid Diet  Brief/Interim Summary: Marcus Brooks is a 56 y.o. male with medical history significant for remote alcohol abuse, ongoing tobacco abuse, chronic pancreatitis with recent development of phlegmon and currently on antibiotics, protein calorie malnutrition, chronic pain, GERD, CAD, and suspicious lung nodule who presents to the emergency department at the direction of his oncologist for the aforementioned symptoms and a hemoglobin of 5.8. Patient was recently admitted to the hospital, discharged on 05/15/2016 after management of acute on chronic pancreatitis with phlegmon noted on CT. He was started on antibiotics for this, initially was requiring TPN, and was discharged with a PICC to complete a course of Unasyn. He was noted to have a lung nodule on chest x-ray during that admission, this was followed by CT chest which also demonstrated some concerning adenopathy, and he was seen by oncology today for his first consultation. Lab work was obtained and he was noted to have a hemoglobin of 5.8 and directed to the ED for further evaluation of this.   Patient reports that he had been experiencing hematemesis for several days with both dark and bright blood, but notes that he has not vomited in more than 24 hours now. There was upper abdominal pain, left of center, associated with this, and the pain continues. Patient reports undergoing EGD remotely, but denies any known  ulcer disease or varices. He denies melena or hematochezia and has never experienced similar symptoms previously. Patient also describes some constant, aching chest pain, moderate in intensity, and without any alleviating or exacerbating factors identified. This seems to be chronic, but has worsened. He denies any increase in his chronic dyspnea or cough.   Upon arrival to the ED, patient was found to be afebrile, saturating well on room air, and with vitals otherwise stable. Chemistry panel was notable for serum albumin of 2.7 and CBC features a hemoglobin of 8.3, down from 5.8 earlier in the day, but consistent with hemoglobin of 8.3 on 05/15/2016. Fecal occult blood testing was performed in the ED and negative. 2 units of packed red blood cells were ordered by the ED physician and patient was transfused 1 unit of pRBCwas begun despite the hemoglobin of 8.3 due to the patient's symptoms and pallor. He remained hemodynamically stable in the ED and has not been in any acute respiratory distress. He was admitted to the telemetry unit for ongoing evaluation and management of hematemesis, possible symptomatic anemia. This AM He states he felt well and Hb/Hct increased to 9.1/26.7. Patient was placed on a Protonix gtt and Gastroenterology was going to be called to evaluate but patient decided he wanted to go home and did not want to wait so patient signed out [name]. Risks of leaving without being fully evaluated were explained and patient verbalized understanding and still requested to leave AMA.   Discharge Diagnoses:  Principal Problem:   Symptomatic anemia Active Problems:   Hypertension   GERD (gastroesophageal reflux disease)   Hematemesis   Seizure disorder (HCC)   Chronic pancreatitis ([place])  Chronic pain syndrome   Coronary artery disease   Phlegmon of pancreas   Malnutrition (HCC)   Lung nodule  1. Symptomatic anemia, hematemesis  - Pt presented at direction of oncologist for evaluation of  pallor, fatigue, lightheadedness on standing, and Hgb 5.8  - Pt reports several days of hematemesis and upper abd pain with no emesis in at least 24 hrs at time of admit, but persistent abd pain  - Hx of remote EtOH abuse, reports quitting 16 yrs ago; EGD from 2015 with mild esophagitis, no varices or PUD   - Pt takes Protonix and Zantac qD at home - 2 units of pRBCs ordered for immediate transfusion in ED, but CBC returned with Hgb 8.3, consistent with the value from 05/15/16 and was only transfused 1 unit of pRBC - Started Protonix infusion, check post-transfusion CBC  - Hb/Hct improved to 9.1/26.7  - Patient signed out AMA before GI could be called in Consultation    2. Phlegmon of pancreas, chronic pancreatitis  - Pt discharged 05/15/16 after acute management of AoC pancreatitis with phlegmon - He has PICC and has been continued on treatment with Unasyn, end-date 5/19, will continue  - No infectious s/s this admission  - Resume Creon once appropriate for a diet   - Patient Decided to leave Against Medical Advice  3. Lung nodule  - Pt noted to have concerning lung nodule and adenopathy during recent admission  - He saw oncology 05/20/16 for first consultation and PET scan is being arranged  - Follow up with Oncology as an outpatient as patient left AMA  4. Malnutrition  - Pt appears cachectic, reports 50-lb wt loss in last year, albumin 2.7 - He has PEG tube, but reports not currently using, but taking a low-fat diet by mouth  - With reported hematemesis, kept diet restricted to clear liquids initially, nutritionist consultation requested   - Patient left AMA  5. Current smoker  - Pt reports ongoing 1 ppd habit, was counseled toward cessation  - Nicotine patch provided during hospitalization  6. Seizure disorder  - Had reported seizure like episode overnight - Continue Dilantin and follow up with Neurology as an outpatient -Patient left AMA  7. CAD, chest pain  - Pt reported  atypical, chronic chest pain, but reports it has worsened in recent days  - Initial troponin 0.00 and EKG with no acute ischemic features; Repeat Troponins <0.03 x2 - He is being monitored on telemetry; Was going to continue serial troponin measurements given concern for acute anemia in pt with known CAD but patient signed out AMA  8. Chronic pain  - Continue home regimen with Oxycodone 10 mg q6h prn but patient signed out AMA  9. Hypokalemia -Patient's K+ was 3.3 this AM -Left AMA prior to it being Replete  Discharge Instructions  Discharge Instructions    Call MD for:  difficulty breathing, headache or visual disturbances    Complete by:  As directed    Call MD for:  extreme fatigue    Complete by:  As directed    Call MD for:  persistant dizziness or light-headedness    Complete by:  As directed    Call MD for:  persistant nausea and vomiting    Complete by:  As directed    Call MD for:  severe uncontrolled pain    Complete by:  As directed    Call MD for:  temperature >100.4    Complete by:  As directed  Increase activity slowly    Complete by:  As directed      Allergies as of 05/21/2016      Reactions   Latex Rash      Medication List    TAKE these medications   albuterol 108 (90 Base) MCG/ACT inhaler Commonly known as:  PROVENTIL HFA;VENTOLIN HFA Inhale 2 puffs into the lungs every 6 (six) hours as needed for wheezing or shortness of breath.   ampicillin-sulbactam IVPB Commonly known as:  UNASYN Inject 1.5 g into the vein every 8 (eight) hours. Indication:  Acute on chronic pancreatitis with phlegmon Last Day of Therapy:  05/30/2016 Labs - Once weekly:  CBC/D and BMP, Labs - Every other week:  ESR and CRP   BIOGUARD GAUZE SPONGES 4"X4" Pads 1 each by Does not apply route 2 (two) times daily.   Cloth Adhesive Surg 1/2"x10yd Tape 1 each by Does not apply route 2 (two) times daily as needed.   CREON 24000-76000 units Cpep Generic drug:  Pancrelipase  (Lip-Prot-Amyl) TAKE TWO CAPSULES BY MOUTH BEFORE MEALS   DULoxetine 30 MG capsule Commonly known as:  CYMBALTA Take 1 capsule (30 mg total) by mouth 2 (two) times daily.   feeding supplement (VITAL AF 1.2 CAL) Liqd Place 1,000 mLs into feeding tube daily. At 31m/hr   ferrous sulfate 325 (65 FE) MG tablet TAKE ONE (1) TABLET BY MOUTH EVERY DAY WITH BREAKFAST What changed:  how much to take  how to take this  when to take this  additional instructions   free water Soln Place 200 mLs into feeding tube every 8 (eight) hours.   gabapentin 300 MG capsule Commonly known as:  NEURONTIN Take 1 capsule (300 mg total) by mouth 4 (four) times daily as needed. What changed:  how much to take  when to take this   hydrocortisone cream 0.5 % Apply 1 application topically 2 (two) times daily.   mirtazapine 15 MG tablet Commonly known as:  REMERON TAKE ONE TABLET BY MOUTH EVERY EVENING   ondansetron 4 MG tablet Commonly known as:  ZOFRAN Take 1 tablet (4 mg total) by mouth every 6 (six) hours as needed for nausea.   Oxycodone HCl 10 MG Tabs Take 1 tablet (10 mg total) by mouth every 6 (six) hours as needed.   pantoprazole 40 MG tablet Commonly known as:  PROTONIX TAKE ONE (1) TABLET BY MOUTH EVERY DAY   phenytoin 100 MG ER capsule Commonly known as:  DILANTIN TAKE THREE CAPSULES BY MOUTH DAILY   promethazine 25 MG tablet Commonly known as:  PHENERGAN Take 25 mg by mouth every 6 (six) hours as needed for nausea or vomiting.   ranitidine 150 MG capsule Commonly known as:  ZANTAC TAKE 1 CAPSULE BY MOUTH TWICE DAILY What changed:  See the new instructions.   vitamin C 500 MG tablet Commonly known as:  ASCORBIC ACID Take 1 tablet (500 mg total) by mouth daily.       Allergies  Allergen Reactions  . Latex Rash   Consultations:  Gastroenterology was going to be called but patient signed out AMA prior to them evaluating.   Procedures/Studies: Dg Chest 2  View  Result Date: 05/20/2016 CLINICAL DATA:  Chest pain EXAM: CHEST  2 VIEW COMPARISON:  05/09/2016 FINDINGS: Right upper extremity PICC with tip at the SVC level. There is no edema, consolidation, effusion, or pneumothorax. Normal heart size and mediastinal contours. Mild hyperinflation in this patient with history of COPD. Embolization coils over  the epigastrium. IMPRESSION: No evidence of acute disease. Electronically Signed   By: Monte Fantasia M.D.   On: 05/20/2016 17:32   Dg Chest 2 View  Result Date: 05/09/2016 CLINICAL DATA:  Chest pain and weight loss EXAM: CHEST  2 VIEW COMPARISON:  Chest radiograph April 06, 2016 and chest CT April 28, 2016 FINDINGS: There is stable mild blunting of the left costophrenic angle. There is no edema or consolidation. Nodular opacity adjacent to the aortic arch on the left is appreciable by radiography but better seen by CT. Heart size is normal. The pulmonary vascularity is normal. There is left hilar adenopathy, stable. No new adenopathy evident. No bone lesions. Coils are noted in the upper abdomen on the left. There are multiple calcified splenic granulomas. There is aortic atherosclerosis. IMPRESSION: Persistent left hilar adenopathy. Stable nodular opacity adjacent to the aortic arch on the left. No edema or consolidation. Mild scarring lateral left base. Aortic atherosclerosis. Calcified splenic granulomas present. Electronically Signed   By: Lowella Grip III M.D.   On: 05/09/2016 11:26   Dg Abdomen 1 View  Result Date: 05/16/2016 CLINICAL DATA:  G-tube placement.  Initial encounter. EXAM: ABDOMEN - 1 VIEW COMPARISON:  CT of the abdomen and pelvis performed 05/09/2016 FINDINGS: The patient's G-tube is noted overlying the lesser curvature of the stomach. Lack of contrast about the G-tube is thought to reflect injection of water. Injected contrast is seen filling the stomach as expected. Coils are seen at the left upper quadrant. The visualized bowel gas  pattern is grossly unremarkable. No free intra-abdominal air is seen, though evaluation for free air is limited on a single supine view. IMPRESSION: Injected contrast noted filling the stomach as expected. Electronically Signed   By: Garald Balding M.D.   On: 05/16/2016 20:54   Ct Chest Wo Contrast  Result Date: 04/28/2016 CLINICAL DATA:  Smoker for 5 years. Chest pain. Abnormal chest x-ray. EXAM: CT CHEST WITHOUT CONTRAST TECHNIQUE: Multidetector CT imaging of the chest was performed following the standard protocol without IV contrast. COMPARISON:  None. FINDINGS: Cardiovascular: No significant vascular findings. Normal heart size. No pericardial effusion. Coronary artery atherosclerosis in the LAD. Mediastinum/Nodes: Enlarge AP window lymph node measuring 2 cm in short axis. Ill-defined left hilar lymphadenopathy. Thyroid gland, trachea, and esophagus demonstrate no significant findings. Lungs/Pleura: No pleural effusion or pneumothorax. Chronic left basilar pleural thickening. 1.4 x 1.7 cm nodule along the left lateral margin of the aortic arch. No other pulmonary nodule or soft tissue mass. Bilateral mild centrilobular emphysema. No pleural effusion or pneumothorax. Upper Abdomen: Gastrostomy tube in satisfactory position. Calcifications in the pancreas as can be seen with chronic pancreatitis or prior granulomatous disease. Calcifications noted in the liver and spleen likely secondary to prior granulomatous disease. Musculoskeletal: No acute osseous abnormality. No lytic or sclerotic osseous lesion. IMPRESSION: 1. 1.4 x 1.7 cm nodule along the left lateral margin of the aortic large. The nodule is isodense with the aorta. Left hilar and AP window lymphadenopathy. Overall appearance is most concerning for primary lung malignancy. CT of the chest with intravenous contrast is recommended to completely exclude an aneurysm. These results will be called to the ordering clinician or representative by the  Radiologist Assistant, and communication documented in the PACS or zVision Dashboard. Electronically Signed   By: Kathreen Devoid   On: 04/28/2016 15:33   Ct Abdomen Pelvis W Contrast  Result Date: 05/09/2016 CLINICAL DATA:  Hemoptysis and abdominal pain over the last 2 days. Recent weight  loss. EXAM: CT ABDOMEN AND PELVIS WITH CONTRAST TECHNIQUE: Multidetector CT imaging of the abdomen and pelvis was performed using the standard protocol following bolus administration of intravenous contrast. CONTRAST:  33m ISOVUE-300 IOPAMIDOL (ISOVUE-300) INJECTION 61% COMPARISON:  11/29/2014. FINDINGS: Lower chest: Lung bases are clear.  No pleural or pericardial fluid. Hepatobiliary: Multiple scattered hepatic granulomas as seen previously. No acute liver finding. No calcified gallstones. Pancreas: Chronic pancreatic calcifications. There appears to be pronounced active inflammation in the lesser sac with a 6-7 cm phlegmonous inflammatory mass with a central low density measuring about 2 cm that could represent actual abscess. This indents the posterior wall of the stomach. Spleen: Multiple calcified granulomas.  Otherwise unremarkable. Adrenals/Urinary Tract: Adrenal glands are normal. Kidneys are normal. Stomach/Bowel: Gastrostomy tube in the stomach. No evidence of bowel obstruction or primary bowel pathology. Vascular/Lymphatic: Aortic atherosclerosis.  No aneurysm. Reproductive: Negative Other: No free fluid or air. Musculoskeletal: Negative IMPRESSION: Acute inflammation in the lesser sac region, likely secondary to chronic pancreatitis. Phlegmonous inflammatory mass measuring approximately 7 cm in diameter. Central nonenhancing region measuring about 2 cm in diameter that could represent a central abscess. Electronically Signed   By: MNelson ChimesM.D.   On: 05/09/2016 15:36    Subjective: Patient Seen and examined at bedside and he stated that he felt fine and wanted to leave. Explained to him that Gastroenterology  needed to come see him prior to leaving. Patient understood risks and verbalized understanding and proceeded to sign out AMA.   Discharge Exam: Vitals:   05/21/16 0145 05/21/16 0430  BP: 113/70 123/70  Pulse: (!) 52 (!) 58  Resp: 16 16  Temp: 97.6 F (36.4 C) 97.6 F (36.4 C)   Vitals:   05/20/16 2125 05/21/16 0015 05/21/16 0145 05/21/16 0430  BP: 116/74 103/61 113/70 123/70  Pulse: (!) 53 (!) 53 (!) 52 (!) 58  Resp: _0 Temp: 97.7 F (36.5 C) 98.2 F (36.8 C) 97.6 F (36.4 C) 97.6 F (36.4 C)  TempSrc: Oral Oral Oral Oral  SpO2: 100% 100% 100% 99%  Weight:    46.1 kg (101 lb 10.1 oz)  Height:       General: Pt is alert, awake, not in acute distress Cardiovascular: RRR, S1/S2 +, no rubs, no gallops Respiratory: CTA bilaterally, no wheezing, no rhonchi Abdominal: Soft, NT, ND, bowel sounds +; PEG tube in place Extremities: no edema, no cyanosis; PICC In place  The results of significant diagnostics from this hospitalization (including imaging, microbiology, ancillary and laboratory) are listed below for reference.    Microbiology: No results found for this or any previous visit (from the past 240 hour(s)).   Labs: BNP (last 3 results) No results for input(s): BNP in the last 8760 hours. Basic Metabolic Panel:  Recent Labs Lab 05/15/16 0508 05/20/16 1416 05/20/16 1643 05/21/16 0405  NA 136 137 135 137  K 4.0 3.9 3.6 3.3*  CL 100*  --  105 110  CO2 _1 GLUCOSE 117* 97 89 151*  BUN 5* 6.9* 8 6  CREATININE 0.42* 0.6* 0.41* 0.44*  CALCIUM 8.2* 8.7 8.4* 7.9*  MG 2.0  --   --   --   PHOS 3.4  --   --   --    Liver Function Tests:  Recent Labs Lab 05/15/16 0508 05/20/16 1416 05/21/16 0405  AST _2 ALT 9* 15 13*  ALKPHOS 78 81 71  BILITOT 0.2* <0.22 0.2*  PROT  5.5* 6.7 5.8*  ALBUMIN 2.5* 2.7* 2.4*    Recent Labs Lab 05/20/16 1718  LIPASE 25   No results for input(s): AMMONIA in the last 168 hours. CBC:  Recent Labs Lab  05/15/16 0508 05/20/16 1416 05/20/16 1643 05/20/16 2251 05/21/16 0937  WBC 6.9 9.4 7.2 6.9 6.9  NEUTROABS 4.5 6.0  --   --  4.5  HGB 8.3* 5.8* 8.3* 9.1* 9.1*  HCT 25.2* 17.4* 24.7* 26.5* 26.7*  MCV 93.7 97.2 96.5 93.6 94.0  PLT 139* 293 266 233 221   Cardiac Enzymes:  Recent Labs Lab 05/20/16 2200 05/21/16 0405  TROPONINI <0.03 <0.03   BNP: Invalid input(s): POCBNP CBG:  Recent Labs Lab 05/15/16 0029 05/15/16 0408 05/15/16 0801 05/15/16 1224 05/21/16 0742  GLUCAP 124* 129* 124* 106* 151*   D-Dimer No results for input(s): DDIMER in the last 72 hours. Hgb A1c No results for input(s): HGBA1C in the last 72 hours. Lipid Profile No results for input(s): CHOL, HDL, LDLCALC, TRIG, CHOLHDL, LDLDIRECT in the last 72 hours. Thyroid function studies No results for input(s): TSH, T4TOTAL, T3FREE, THYROIDAB in the last 72 hours.  Invalid input(s): FREET3 Anemia work up No results for input(s): VITAMINB12, FOLATE, FERRITIN, TIBC, IRON, RETICCTPCT in the last 72 hours. Urinalysis    Component Value Date/Time   COLORURINE AMBER (A) 05/09/2016 1258   APPEARANCEUR CLEAR 05/09/2016 1258   LABSPEC 1.024 05/09/2016 1258   PHURINE 7.0 05/09/2016 1258   GLUCOSEU NEGATIVE 05/09/2016 1258   HGBUR NEGATIVE 05/09/2016 1258   BILIRUBINUR SMALL (A) 05/09/2016 1258   KETONESUR NEGATIVE 05/09/2016 1258   PROTEINUR 100 (A) 05/09/2016 1258   UROBILINOGEN 2.0 (H) 04/06/2016 1526   NITRITE NEGATIVE 05/09/2016 1258   LEUKOCYTESUR NEGATIVE 05/09/2016 1258   Sepsis Labs Invalid input(s): PROCALCITONIN,  WBC,  LACTICIDVEN Microbiology No results found for this or any previous visit (from the past 240 hour(s)).  Time coordinating discharge: 20 minutes  SIGNED:  Kerney Elbe, DO [facility] 05/21/2016, 1:06 PM Pager [phone]  If 7PM-7AM, please contact night-coverage [email or web address] Password [email or web address]

## 2016-05-21 NOTE — Progress Notes (Signed)
Family member witnessed patient with seizure like activity. Per family member patient sat up in the bed and his lower extremities and hands began to jerk. After the jerking movements ended the patient slumped over almost hitting his head on the side rail. This RN performed a neurological assessment. Patient only alert to name and birthdate after the seizure like activity. Pupils equal and reactive. VSS. Patient c/o dizziness and not "feeling right". NP on call notified.  New order placed. After about 10-15 minutes patient started to become more alert and oriented. Per family member the movements resembled seizures patient has had in the past. Will continue to monitor patient closely.

## 2016-05-24 LAB — TYPE AND SCREEN
ABO/RH(D): O POS
ANTIBODY SCREEN: NEGATIVE
UNIT DIVISION: 0
Unit division: 0

## 2016-05-24 LAB — BPAM RBC
BLOOD PRODUCT EXPIRATION DATE: 201805282359
Blood Product Expiration Date: 201806072359
ISSUE DATE / TIME: 201805091839
UNIT TYPE AND RH: 5100
Unit Type and Rh: 5100

## 2016-06-02 ENCOUNTER — Ambulatory Visit (INDEPENDENT_AMBULATORY_CARE_PROVIDER_SITE_OTHER): Payer: Medicaid Other | Admitting: Family Medicine

## 2016-06-02 ENCOUNTER — Encounter: Payer: Self-pay | Admitting: Family Medicine

## 2016-06-02 VITALS — BP 118/76 | HR 87 | Temp 98.3°F | Resp 14 | Ht 66.0 in | Wt 92.0 lb

## 2016-06-02 DIAGNOSIS — R52 Pain, unspecified: Secondary | ICD-10-CM | POA: Diagnosis not present

## 2016-06-02 DIAGNOSIS — E46 Unspecified protein-calorie malnutrition: Secondary | ICD-10-CM

## 2016-06-02 DIAGNOSIS — F17200 Nicotine dependence, unspecified, uncomplicated: Secondary | ICD-10-CM

## 2016-06-02 DIAGNOSIS — Z79891 Long term (current) use of opiate analgesic: Secondary | ICD-10-CM

## 2016-06-02 DIAGNOSIS — R109 Unspecified abdominal pain: Secondary | ICD-10-CM | POA: Diagnosis not present

## 2016-06-02 DIAGNOSIS — R911 Solitary pulmonary nodule: Secondary | ICD-10-CM

## 2016-06-02 DIAGNOSIS — K860 Alcohol-induced chronic pancreatitis: Secondary | ICD-10-CM

## 2016-06-02 LAB — POCT URINALYSIS DIP (DEVICE)
BILIRUBIN URINE: NEGATIVE
Glucose, UA: NEGATIVE mg/dL
HGB URINE DIPSTICK: NEGATIVE
LEUKOCYTES UA: NEGATIVE
Nitrite: NEGATIVE
PH: 6.5 (ref 5.0–8.0)
Protein, ur: 30 mg/dL — AB
SPECIFIC GRAVITY, URINE: 1.02 (ref 1.005–1.030)
Urobilinogen, UA: 2 mg/dL — ABNORMAL HIGH (ref 0.0–1.0)

## 2016-06-02 MED ORDER — OXYCODONE HCL 10 MG PO TABS: 10.0000 mg | ORAL_TABLET | Freq: Four times a day (QID) | ORAL | 0 refills | Status: DC | PRN

## 2016-06-02 NOTE — Patient Instructions (Signed)
Chronic pain:  Oxycodone 10 mg every 6 hours as needed for moderate to severe pain  Lung Nodule:   Follow up with Dr. Mora Appl on 06/11/2016 as scheduled   Pancreatitis:  Continue IV antibiotics through PICC line through [facility]

## 2016-06-02 NOTE — Progress Notes (Signed)
Subjective:    Patient ID: Marcus Brooks, male    DOB: [birth year], 56 y.o.   MRN: [id number]  HPI Mr. Marcus Brooks, a 56 year old male with a history of seizure disorder, chronic pancreatitis, and poor nutrition presents for abdominal pain. He says that abdominal pain is unbearable at times. Current pain intensity is 10/10 and constant. He has not attempted any OTC interventions to alleviate symptoms.  Patient was hospitalized  on 05/20/2016 for anemia. He was evaluated in the cancer center and found to have a hemoglobin of 5.8. Patient was transitioned to inpatient services. He left inpatient services on 05/21/2016 against medical advice. He recently had a chest CT on 04/28/2016, which shows a 1.4 x 1.7 cm nodule along the left lateral margin of the aortic large. The nodule is isodense with the aorta. Left hilar and AP window lymphadenopathy. Overall appearance is most concerning for primary lung malignancy. Mr Zylstra was informed of the findings prior to appointment.  He continues to smoke 1 pack per day and has had a weight loss greater than 50 pounds. He has a follow up with his oncologist on 06/11/2016.   Mr. 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 He states that he has not had alcohol in greater than 1 year. He had a left upper quadrant PEG tube placed during one of his pancreatitis flares.  He continues to have tenderness to the PEG insertion site. He is under the care of gastroenterology in [facility] with Dr. Hildred Laser. He has a colonoscopy scheduled for next month. He was also discharge home with IV antibiotics.     Immunization History  Administered Date(s) Administered  . Influenza,inj,Quad PF,36+ Mos 01/10/2013  . PPD Test 01/27/2013  . Pneumococcal Polysaccharide-23 06/11/2015  . Tdap 09/12/2012   Past Surgical History:  Procedure Laterality Date  . CORONARY ANGIOPLASTY WITH STENT PLACEMENT    . ESOPHAGOGASTRODUODENOSCOPY N/A 01/18/2013    Procedure: ESOPHAGOGASTRODUODENOSCOPY (EGD);  Surgeon: Rogene Houston, MD;  [facility];  Service: Endoscopy;  Laterality: N/A;  . EUS  03/26/2009   [facility]  . GASTROSTOMY-JEJEUNOSTOMY TUBE CHANGE/PLACEMENT  12/11/2008   [facility]  . I&D EXTREMITY Left 09/12/2012   Procedure: IRRIGATION AND DEBRIDEMENT LEFT FIFTH FINGER;  Surgeon: Roseanne Kaufman, MD;  Location: [place];  Service: Orthopedics;  Laterality: Left;  . JEJUNOSTOMY FEEDING TUBE  07/13/2010  . NERVE AND TENDON REPAIR Left 09/12/2012   Procedure: NERVE AND TENDON REPAIR LEFT FIFTH FINGER;  Surgeon: Roseanne Kaufman, MD;  Location: [place];  Service: Orthopedics;  Laterality: Left;  . OPEN REDUCTION INTERNAL FIXATION (ORIF) METACARPAL Left 09/12/2012   Procedure: OPEN REDUCTION INTERNAL FIXATION LEFT FIFTH FINGER;  Surgeon: Roseanne Kaufman, MD;  Location: [place];  Service: Orthopedics;  Laterality: Left;  . PANCREATIC PSEUDOCYST DRAINAGE  07/13/2010  . PORTACATH PLACEMENT  11/28/2008   FLEISHMAN    Review of Systems  Constitutional: Negative.  Negative for fatigue and fever.  HENT: Negative.   Eyes: Negative.  Negative for photophobia.  Respiratory: Negative.  Negative for chest tightness.   Cardiovascular: Negative.  Negative for palpitations and leg swelling.  Gastrointestinal: Positive for abdominal pain.  Endocrine: Negative.  Negative for polydipsia, polyphagia and polyuria.  Genitourinary: Negative.   Musculoskeletal: Negative.   Skin: Negative.   Allergic/Immunologic: Negative.  Negative for immunocompromised state.  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  Hematological: Negative.   Psychiatric/Behavioral: Positive for sleep  disturbance.       Objective:   Physical Exam  Constitutional: He is oriented to person, place, and time. He appears well-developed. He appears cachectic. He has a sickly appearance.  HENT:  Head: Normocephalic and atraumatic.  Right  Ear: External ear normal.  Left Ear: External ear normal.  Nose: Nose normal.  Mouth/Throat: Oropharynx is clear and moist. Abnormal dentition (partially edentulous).  Eyes: Conjunctivae and EOM are normal. Pupils are equal, round, and reactive to light.  Neck: Trachea normal and normal range of motion. Neck supple.  Cardiovascular: Normal rate, regular rhythm, normal heart sounds and intact distal pulses.   Pulmonary/Chest: Effort normal and breath sounds normal.  Abdominal: Soft. Bowel sounds are normal. There is tenderness in the right upper quadrant and left upper quadrant.  LUG PEG tube, pus, or edema, tender to palpation.  Mild erythema  Musculoskeletal: Normal range of motion.  Neurological: He is oriented to person, place, and time. He has normal reflexes. He is unresponsive.  Skin: Skin is warm, dry and intact. No rash noted. No cyanosis. There is pallor. Nails show no clubbing.  Psychiatric: His speech is normal and behavior is normal. Thought content normal. He exhibits a depressed mood.      BP 118/76 (BP Location: Left Arm, Patient Position: Sitting, Cuff Size: Small)   Pulse 87   Temp 98.3 F (36.8 C) (Oral)   Resp 14   Ht '5\' 6"'$  (1.676 m)   Wt 92 lb (41.7 kg)   SpO2 100%   BMI 14.85 kg/m  Assessment & Plan:  1. Abdominal pain, unspecified abdominal location - CBC with Differential - COMPLETE METABOLIC PANEL WITH GFR - POCT urinalysis dip (device)  2. Generalized pain Reviewed [place] Substance Reporting system prior to prescribing opiate medications. No inconsistencies noted.   - Pain Mgmt, Profile 8 w/Conf, U - Oxycodone HCl 10 MG TABS; Take 1 tablet (10 mg total) by mouth every 6 (six) hours as needed.  Dispense: 60 tablet; Refill: 0  -Acute and chronic painful episodes - We agreed on Oxycodone. We discussed that pt is to receive his Schedule II prescriptions here. Pt is also aware that the prescription history is available to [place] online through the [facility] CSRS.  Controlled substance agreement signed 06/02/2016. We reminded [place] that all patients receiving Schedule II narcotics must be seen for follow within one month of prescription being requested. We reviewed the terms of our pain agreement, including the need to keep medicines in a safe locked location away from children or pets, and the need to report excess sedation or constipation, measures to avoid constipation, and policies related to early refills and stolen prescriptions. According to the [place] Chronic Pain Initiative program, we have reviewed details related to analgesia, adverse effects, aberrant behaviors. 3. Chronic prescription opiate use - Pain Mgmt, Profile 8 w/Conf, U - Oxycodone HCl 10 MG TABS; Take 1 tablet (10 mg total) by mouth every 6 (six) hours as needed.  Dispense: 60 tablet; Refill: 0  4. Protein-calorie malnutrition, unspecified severity (HCC) Continue bolus protein shakes through tube.   5. Tobacco use disorder Smoking cessation instruction/counseling given:  counseled patient on the dangers of tobacco use, advised patient to stop smoking, and reviewed strategies to maximize success. He is not interested in quitting smoking at this time   6. Alcohol-induced chronic pancreatitis [facility]) Patient has a right upper arm PICC line, he is flushing the line himself. [facility] is administering [facility]. He was scheduled to have 3  weeks of IV antibiotics and will likely need and repeat Ct of abdomen and pelvis.   7. Lung Nodule Follow up in oncology as scheduled.     Donia Pounds  MSN, FNP-C [facility] [place], [place] [address] [phone]   The patient was given clear instructions to go to ER or return to medical center if symptoms do not improve, worsen or new problems develop. The patient verbalized understanding.

## 2016-06-03 ENCOUNTER — Telehealth: Payer: Self-pay

## 2016-06-03 LAB — PAIN MGMT, PROFILE 8 W/CONF, U: Please note:: 0

## 2016-06-03 NOTE — Telephone Encounter (Signed)
This has been approved and I have left patient a voicemail advising this has been approved. Also, I called the pharmacy and they are getting it ready. Thanks!

## 2016-06-05 ENCOUNTER — Other Ambulatory Visit: Payer: Self-pay | Admitting: Family Medicine

## 2016-06-05 ENCOUNTER — Other Ambulatory Visit: Payer: Medicaid Other

## 2016-06-05 DIAGNOSIS — K863 Pseudocyst of pancreas: Secondary | ICD-10-CM

## 2016-06-05 DIAGNOSIS — G40909 Epilepsy, unspecified, not intractable, without status epilepticus: Secondary | ICD-10-CM

## 2016-06-05 DIAGNOSIS — G894 Chronic pain syndrome: Secondary | ICD-10-CM

## 2016-06-05 LAB — CBC WITH DIFFERENTIAL/PLATELET
BASOS PCT: 2 %
Basophils Absolute: 108 cells/uL (ref 0–200)
EOS ABS: 810 {cells}/uL — AB (ref 15–500)
EOS PCT: 15 %
HCT: 30.2 % — ABNORMAL LOW (ref 38.5–50.0)
Hemoglobin: 10.1 g/dL — ABNORMAL LOW (ref 13.2–17.1)
LYMPHS PCT: 27 %
Lymphs Abs: 1458 cells/uL (ref 850–3900)
MCH: 31.2 pg (ref 27.0–33.0)
MCHC: 33.4 g/dL (ref 32.0–36.0)
MCV: 93.2 fL (ref 80.0–100.0)
MONOS PCT: 15 %
MPV: 8.5 fL (ref 7.5–12.5)
Monocytes Absolute: 810 cells/uL (ref 200–950)
NEUTROS ABS: 2214 {cells}/uL (ref 1500–7800)
Neutrophils Relative %: 41 %
PLATELETS: 152 10*3/uL (ref 140–400)
RBC: 3.24 MIL/uL — ABNORMAL LOW (ref 4.20–5.80)
RDW: 14.5 % (ref 11.0–15.0)
WBC: 5.4 10*3/uL (ref 3.8–10.8)

## 2016-06-06 LAB — COMPLETE METABOLIC PANEL WITH GFR
ALT: 31 U/L (ref 9–46)
AST: 32 U/L (ref 10–35)
Albumin: 3.2 g/dL — ABNORMAL LOW (ref 3.6–5.1)
Alkaline Phosphatase: 124 U/L — ABNORMAL HIGH (ref 40–115)
BILIRUBIN TOTAL: 0.2 mg/dL (ref 0.2–1.2)
BUN: 8 mg/dL (ref 7–25)
CHLORIDE: 107 mmol/L (ref 98–110)
CO2: 18 mmol/L — AB (ref 20–31)
Calcium: 8.1 mg/dL — ABNORMAL LOW (ref 8.6–10.3)
Creat: 0.52 mg/dL — ABNORMAL LOW (ref 0.70–1.33)
GFR, Est African American: >89 mL/min (ref >=60)
GLUCOSE: 167 mg/dL — AB (ref 65–99)
Potassium: 3.9 mmol/L (ref 3.5–5.3)
SODIUM: 136 mmol/L (ref 135–146)
TOTAL PROTEIN: 6.5 g/dL (ref 6.1–8.1)

## 2016-06-06 LAB — PHENYTOIN LEVEL, TOTAL: Phenytoin Lvl: 7 mg/L — ABNORMAL LOW (ref 10.0–20.0)

## 2016-06-09 ENCOUNTER — Other Ambulatory Visit: Payer: Self-pay | Admitting: Family Medicine

## 2016-06-09 ENCOUNTER — Telehealth: Payer: Self-pay

## 2016-06-09 DIAGNOSIS — K859 Acute pancreatitis without necrosis or infection, unspecified: Secondary | ICD-10-CM

## 2016-06-09 DIAGNOSIS — G40909 Epilepsy, unspecified, not intractable, without status epilepticus: Secondary | ICD-10-CM

## 2016-06-09 LAB — PAIN MGMT, PROFILE 8 W/CONF, U
6 Acetylmorphine: NEGATIVE ng/mL (ref <10)
ALPHAHYDROXYMIDAZOLAM: NEGATIVE ng/mL (ref <50)
AMPHETAMINES: NEGATIVE ng/mL (ref <500)
Alcohol Metabolites: NEGATIVE ng/mL (ref <500)
Alphahydroxyalprazolam: NEGATIVE ng/mL (ref <25)
Alphahydroxytriazolam: NEGATIVE ng/mL (ref <50)
Aminoclonazepam: NEGATIVE ng/mL (ref <25)
BENZODIAZEPINES: NEGATIVE ng/mL (ref <100)
Buprenorphine: NEGATIVE ng/mL (ref <5)
COCAINE METABOLITE: NEGATIVE ng/mL (ref <150)
CREATININE: 121.3 mg/dL (ref >=20.0)
Codeine: NEGATIVE ng/mL (ref <50)
Hydrocodone: NEGATIVE ng/mL (ref <50)
Hydromorphone: NEGATIVE ng/mL (ref <50)
Hydroxyethylflurazepam: NEGATIVE ng/mL (ref <50)
LORAZEPAM: NEGATIVE ng/mL (ref <50)
MDMA: NEGATIVE ng/mL (ref <500)
MORPHINE: NEGATIVE ng/mL (ref <50)
Marijuana Metabolite: NEGATIVE ng/mL (ref <20)
NORDIAZEPAM: NEGATIVE ng/mL (ref <50)
Norhydrocodone: 78 ng/mL — ABNORMAL HIGH (ref <50)
Noroxycodone: NEGATIVE ng/mL (ref <50)
OPIATES: POSITIVE ng/mL — AB (ref <100)
OXIDANT: NEGATIVE ug/mL (ref <200)
Oxazepam: NEGATIVE ng/mL (ref <50)
Oxycodone: 829 ng/mL — ABNORMAL HIGH (ref <50)
Oxycodone: POSITIVE ng/mL — AB (ref <100)
Oxymorphone: 254 ng/mL — ABNORMAL HIGH (ref <50)
PH: 6.03 (ref 4.5–9.0)
Please note:: 0
Temazepam: NEGATIVE ng/mL (ref <50)

## 2016-06-09 MED ORDER — PHENYTOIN SODIUM EXTENDED 100 MG PO CAPS: 200.0000 mg | ORAL_CAPSULE | Freq: Two times a day (BID) | ORAL | 0 refills | Status: DC

## 2016-06-09 NOTE — Telephone Encounter (Signed)
I called and spoke with patient, advised that dilantin level is out of range. Asked that patient change the directions to 200mg  twice daily and that new rx has been sent to pharmacy. Patient verbalized understanding and I have scheduled him to come back in 1 month (june 29th) for lab draw.   Also advised patient that He is in need of a Ct before his picc line can be removed. I have submitted this to insurance company for approval and will call him back and schedule this once approved. Thanks!

## 2016-06-09 NOTE — Telephone Encounter (Signed)
-----   Message from [name], [place] sent at 06/09/2016  8:40 AM EDT ----- Regarding: lab results Please inform Marcus Brooks that dilantin level is not therapeutic on current medication regimen. Will change medication to 200 mg twice daily. Will check dilantin level in 1 month.Please schedule a lab appt.   Thanks ----- Message ----- From: Interface, Lab In [facility] Sent: 06/06/2016   1:00 AM To: [name], FNP

## 2016-06-09 NOTE — Telephone Encounter (Signed)
[place], Please advise. Thanks!  

## 2016-06-09 NOTE — Telephone Encounter (Signed)
Called, no answer. Left message for patient to return call. Thanks!  

## 2016-06-09 NOTE — Progress Notes (Signed)
Current dosage of dilantin for seizure disorder is not therapeutic. Will change medication dosage to 200 mg BID. Will check dilantin level in 1 month.   Meds ordered this encounter  Medications  . phenytoin (DILANTIN) 100 MG ER capsule    Sig: Take 2 capsules (200 mg total) by mouth 2 (two) times daily.    Dispense:  120 capsule    Refill:  0    Donia Pounds  MSN, FNP-C [facility] [address] [place], [place] [address] [phone]

## 2016-06-11 ENCOUNTER — Ambulatory Visit: Payer: Medicaid Other | Admitting: Internal Medicine

## 2016-06-11 ENCOUNTER — Telehealth: Payer: Self-pay

## 2016-06-11 ENCOUNTER — Telehealth: Payer: Self-pay | Admitting: Internal Medicine

## 2016-06-11 ENCOUNTER — Telehealth: Payer: Self-pay | Admitting: *Deleted

## 2016-06-11 NOTE — Telephone Encounter (Signed)
Called and left a message for patient to call back regarding CT Scan.  When patient returns call need to advise of CT scheduled for June 5th @2 :00pm at [facility]. Patient needs to arrive 15 minutes early and pick up Oral Contrast at least 24 hours in advance. Also need to schedule an appointment for patient to come in to day hospital on June 6th @1 :00pm for Picc Line removal. Thanks!

## 2016-06-11 NOTE — Telephone Encounter (Signed)
Call received from patient's "fiancee requesting dates and times of scans.  We both have a lot of appointments and trying to work out our schedules."  Provided appointment information for June 5 th and 6 th.  Answered questions.  Myrtie reported she wrote everything down.

## 2016-06-11 NOTE — Telephone Encounter (Signed)
Per 5/30 schedule message move f/u to 6/11 due to pet scan not until 6/6. No availability 6/11. F/u moved from 5/30 to 6/12. Left message for patient and mailed schedule.

## 2016-06-11 NOTE — Telephone Encounter (Signed)
Called and spoke with patient, advised of Ct. Patient verbalized understanding. Thanks!

## 2016-06-11 NOTE — Telephone Encounter (Signed)
Pt has not had PET scan, called pt to inform he does not need to come in today for appt as there are no results until 06/17/16 PET has been completed.

## 2016-06-16 ENCOUNTER — Other Ambulatory Visit: Payer: Self-pay | Admitting: Medical Oncology

## 2016-06-16 ENCOUNTER — Ambulatory Visit (HOSPITAL_COMMUNITY)
Admission: RE | Admit: 2016-06-16 | Discharge: 2016-06-16 | Disposition: A | Discharge status: Home / Self Care | Payer: Medicaid Other | Source: Ambulatory Visit | Attending: Family Medicine | Admitting: Family Medicine

## 2016-06-16 ENCOUNTER — Other Ambulatory Visit: Payer: Self-pay | Admitting: Internal Medicine

## 2016-06-16 DIAGNOSIS — I774 Celiac artery compression syndrome: Secondary | ICD-10-CM | POA: Diagnosis not present

## 2016-06-16 DIAGNOSIS — I701 Atherosclerosis of renal artery: Secondary | ICD-10-CM | POA: Insufficient documentation

## 2016-06-16 DIAGNOSIS — R911 Solitary pulmonary nodule: Secondary | ICD-10-CM

## 2016-06-16 DIAGNOSIS — I708 Atherosclerosis of other arteries: Secondary | ICD-10-CM | POA: Diagnosis not present

## 2016-06-16 DIAGNOSIS — K859 Acute pancreatitis without necrosis or infection, unspecified: Secondary | ICD-10-CM | POA: Insufficient documentation

## 2016-06-16 DIAGNOSIS — K861 Other chronic pancreatitis: Secondary | ICD-10-CM | POA: Diagnosis not present

## 2016-06-16 MED ORDER — IOPAMIDOL (ISOVUE-300) INJECTION 61%
100.0000 mL | Freq: Once | INTRAVENOUS | Status: AC | PRN
  Administered 2016-06-16: 80 mL via INTRAVENOUS

## 2016-06-16 MED ORDER — IOPAMIDOL (ISOVUE-300) INJECTION 61%
INTRAVENOUS | Status: AC
  Filled 2016-06-16: qty 100

## 2016-06-17 ENCOUNTER — Other Ambulatory Visit: Payer: Self-pay | Admitting: Family Medicine

## 2016-06-17 ENCOUNTER — Encounter (HOSPITAL_COMMUNITY)
Admission: RE | Admit: 2016-06-17 | Discharge: 2016-06-17 | Disposition: A | Discharge status: Home / Self Care | Payer: Medicaid Other | Source: Ambulatory Visit | Attending: Internal Medicine | Admitting: Internal Medicine

## 2016-06-17 ENCOUNTER — Encounter (HOSPITAL_COMMUNITY): Admission: RE | Admit: 2016-06-17 | Payer: Medicaid Other | Source: Ambulatory Visit

## 2016-06-17 ENCOUNTER — Encounter (HOSPITAL_COMMUNITY): Payer: Medicaid Other

## 2016-06-17 DIAGNOSIS — R911 Solitary pulmonary nodule: Secondary | ICD-10-CM | POA: Diagnosis not present

## 2016-06-17 DIAGNOSIS — Z792 Long term (current) use of antibiotics: Secondary | ICD-10-CM

## 2016-06-17 LAB — GLUCOSE, CAPILLARY: Glucose-Capillary: 100 mg/dL — ABNORMAL HIGH (ref 65–99)

## 2016-06-17 MED ORDER — FLUDEOXYGLUCOSE F - 18 (FDG) INJECTION: 5.0000 | Freq: Once | INTRAVENOUS | Status: DC | PRN

## 2016-06-19 ENCOUNTER — Other Ambulatory Visit: Payer: Self-pay

## 2016-06-19 MED ORDER — FERROUS SULFATE 325 (65 FE) MG PO TABS: ORAL_TABLET | ORAL | 3 refills | Status: DC

## 2016-06-19 NOTE — Telephone Encounter (Signed)
REFILL FOR FERROUS SULFATE SENT INTO PHARMACY. THANKS!

## 2016-06-22 ENCOUNTER — Encounter (HOSPITAL_COMMUNITY)
Admit: 2016-06-22 | Discharge: 2016-06-22 | Disposition: A | Discharge status: Home / Self Care | Payer: Medicaid Other | Source: Ambulatory Visit | Attending: Internal Medicine | Admitting: Internal Medicine

## 2016-06-22 ENCOUNTER — Encounter (HOSPITAL_COMMUNITY): Payer: Self-pay

## 2016-06-22 ENCOUNTER — Other Ambulatory Visit (INDEPENDENT_AMBULATORY_CARE_PROVIDER_SITE_OTHER): Payer: Self-pay | Admitting: *Deleted

## 2016-06-22 ENCOUNTER — Telehealth: Payer: Self-pay

## 2016-06-22 DIAGNOSIS — Z01818 Encounter for other preprocedural examination: Secondary | ICD-10-CM | POA: Diagnosis not present

## 2016-06-22 DIAGNOSIS — Z86010 Personal history of colonic polyps: Secondary | ICD-10-CM

## 2016-06-22 NOTE — Patient Instructions (Signed)
Marcus Brooks  06/22/2016     @PREFPERIOPPHARMACY @   Your procedure is scheduled on  06/26/2016   Report to [facility] at  800  A.M.  Call this number if you have problems the morning of surgery:  [phone]   Remember:  Do not eat food or drink liquids after midnight.  Take these medicines the morning of surgery with A SIP OF WATER  Cymbalta, neurontin, zofran, oxycodone, protonix or zantac, dilantin. Use your inhaler before you come.   Do not wear jewelry, make-up or nail polish.  Do not wear lotions, powders, or perfumes, or deoderant.  Do not shave 48 hours prior to surgery.  Men may shave face and neck.  Do not bring valuables to the hospital.  [facility] is not responsible for any belongings or valuables.  Contacts, dentures or bridgework may not be worn into surgery.  Leave your suitcase in the car.  After surgery it may be brought to your room.  For patients admitted to the hospital, discharge time will be determined by your treatment team.  Patients discharged the day of surgery will not be allowed to drive home.   Name and phone number of your driver:   family Special instructions:  Follow the diet and prep instructions given you by Dr Olevia Perches office.  Please read over the following fact sheets that you were given. Anesthesia Post-op Instructions and Care and Recovery After Surgery       Colonoscopy, Adult A colonoscopy is an exam to look at the entire large intestine. During the exam, a lubricated, bendable tube is inserted into the anus and then passed into the rectum, colon, and other parts of the large intestine. A colonoscopy is often done as a part of normal colorectal screening or in response to certain symptoms, such as anemia, persistent diarrhea, abdominal pain, and blood in the stool. The exam can help screen for and diagnose medical problems, including:  Tumors.  Polyps.  Inflammation.  Areas of bleeding.  Tell a health care  provider about:  Any allergies you have.  All medicines you are taking, including vitamins, herbs, eye drops, creams, and over-the-counter medicines.  Any problems you or family members have had with anesthetic medicines.  Any blood disorders you have.  Any surgeries you have had.  Any medical conditions you have.  Any problems you have had passing stool. What are the risks? Generally, this is a safe procedure. However, problems may occur, including:  Bleeding.  A tear in the intestine.  A reaction to medicines given during the exam.  Infection (rare).  What happens before the procedure? Eating and drinking restrictions Follow instructions from your health care provider about eating and drinking, which may include:  A few days before the procedure - follow a low-fiber diet. Avoid nuts, seeds, dried fruit, raw fruits, and vegetables.  1-3 days before the procedure - follow a clear liquid diet. Drink only clear liquids, such as clear broth or bouillon, black coffee or tea, clear juice, clear soft drinks or sports drinks, gelatin dessert, and popsicles. Avoid any liquids that contain red or purple dye.  On the day of the procedure - do not eat or drink anything during the 2 hours before the procedure, or within the time period that your health care provider recommends.  Bowel prep If you were prescribed an oral bowel prep to clean out your colon:  Take it as told by  your health care provider. Starting the day before your procedure, you will need to drink a large amount of medicated liquid. The liquid will cause you to have multiple loose stools until your stool is almost clear or light green.  If your skin or anus gets irritated from diarrhea, you may use these to relieve the irritation: ? Medicated wipes, such as adult wet wipes with aloe and vitamin E. ? A skin soothing-product like petroleum jelly.  If you vomit while drinking the bowel prep, take a break for up to 60  minutes and then begin the bowel prep again. If vomiting continues and you cannot take the bowel prep without vomiting, call your health care provider.  General instructions  Ask your health care provider about changing or stopping your regular medicines. This is especially important if you are taking diabetes medicines or blood thinners.  Plan to have someone take you home from the hospital or clinic. What happens during the procedure?  An IV tube may be inserted into one of your veins.  You will be given medicine to help you relax (sedative).  To reduce your risk of infection: ? Your health care team will wash or sanitize their hands. ? Your anal area will be washed with soap.  You will be asked to lie on your side with your knees bent.  Your health care provider will lubricate a long, thin, flexible tube. The tube will have a camera and a light on the end.  The tube will be inserted into your anus.  The tube will be gently eased through your rectum and colon.  Air will be delivered into your colon to keep it open. You may feel some pressure or cramping.  The camera will be used to take images during the procedure.  A small tissue sample may be removed from your body to be examined under a microscope (biopsy). If any potential problems are found, the tissue will be sent to a lab for testing.  If small polyps are found, your health care provider may remove them and have them checked for cancer cells.  The tube that was inserted into your anus will be slowly removed. The procedure may vary among health care providers and hospitals. What happens after the procedure?  Your blood pressure, heart rate, breathing rate, and blood oxygen level will be monitored until the medicines you were given have worn off.  Do not drive for 24 hours after the exam.  You may have a small amount of blood in your stool.  You may pass gas and have mild abdominal cramping or bloating due to the air  that was used to inflate your colon during the exam.  It is up to you to get the results of your procedure. Ask your health care provider, or the department performing the procedure, when your results will be ready. This information is not intended to replace advice given to you by your health care provider. Make sure you discuss any questions you have with your health care provider. Document Released: 12/27/1999 Document Revised: 10/30/2015 Document Reviewed: 03/12/2015 Elsevier Interactive Patient Education  2018 [employer].  Colonoscopy, Adult, Care After This sheet gives you information about how to care for yourself after your procedure. Your health care provider may also give you more specific instructions. If you have problems or questions, contact your health care provider. What can I expect after the procedure? After the procedure, it is common to have:  A small amount of blood  in your stool for 24 hours after the procedure.  Some gas.  Mild abdominal cramping or bloating.  Follow these instructions at home: General instructions   For the first 24 hours after the procedure: ? Do not drive or use machinery. ? Do not sign important documents. ? Do not drink alcohol. ? Do your regular daily activities at a slower pace than normal. ? Eat soft, easy-to-digest foods. ? Rest often.  Take over-the-counter or prescription medicines only as told by your health care provider.  It is up to you to get the results of your procedure. Ask your health care provider, or the department performing the procedure, when your results will be ready. Relieving cramping and bloating  Try walking around when you have cramps or feel bloated.  Apply heat to your abdomen as told by your health care provider. Use a heat source that your health care provider recommends, such as a moist heat pack or a heating pad. ? Place a towel between your skin and the heat source. ? Leave the heat on for 20-30  minutes. ? Remove the heat if your skin turns bright red. This is especially important if you are unable to feel pain, heat, or cold. You may have a greater risk of getting burned. Eating and drinking  Drink enough fluid to keep your urine clear or pale yellow.  Resume your normal diet as instructed by your health care provider. Avoid heavy or fried foods that are hard to digest.  Avoid drinking alcohol for as long as instructed by your health care provider. Contact a health care provider if:  You have blood in your stool 2-3 days after the procedure. Get help right away if:  You have more than a small spotting of blood in your stool.  You pass large blood clots in your stool.  Your abdomen is swollen.  You have nausea or vomiting.  You have a fever.  You have increasing abdominal pain that is not relieved with medicine. This information is not intended to replace advice given to you by your health care provider. Make sure you discuss any questions you have with your health care provider. Document Released: 08/13/2003 Document Revised: 09/23/2015 Document Reviewed: 03/12/2015 Elsevier Interactive Patient Education  2018 [place] Anesthesia is a term that refers to techniques, procedures, and medicines that help a person stay safe and comfortable during a medical procedure. Monitored anesthesia care, or sedation, is one type of anesthesia. Your anesthesia specialist may recommend sedation if you will be having a procedure that does not require you to be unconscious, such as:  Cataract surgery.  A dental procedure.  A biopsy.  A colonoscopy.  During the procedure, you may receive a medicine to help you relax (sedative). There are three levels of sedation:  Mild sedation. At this level, you may feel awake and relaxed. You will be able to follow directions.  Moderate sedation. At this level, you will be sleepy. You may not remember the  procedure.  Deep sedation. At this level, you will be asleep. You will not remember the procedure.  The more medicine you are given, the deeper your level of sedation will be. Depending on how you respond to the procedure, the anesthesia specialist may change your level of sedation or the type of anesthesia to fit your needs. An anesthesia specialist will monitor you closely during the procedure. Let your health care provider know about:  Any allergies you have.  All medicines  you are taking, including vitamins, herbs, eye drops, creams, and over-the-counter medicines.  Any use of steroids (by mouth or as a cream).  Any problems you or family members have had with sedatives and anesthetic medicines.  Any blood disorders you have.  Any surgeries you have had.  Any medical conditions you have, such as sleep apnea.  Whether you are pregnant or may be pregnant.  Any use of cigarettes, alcohol, or street drugs. What are the risks? Generally, this is a safe procedure. However, problems may occur, including:  Getting too much medicine (oversedation).  Nausea.  Allergic reaction to medicines.  Trouble breathing. If this happens, a breathing tube may be used to help with breathing. It will be removed when you are awake and breathing on your own.  Heart trouble.  Lung trouble.  Before the procedure Staying hydrated Follow instructions from your health care provider about hydration, which may include:  Up to 2 hours before the procedure - you may continue to drink clear liquids, such as water, clear fruit juice, black coffee, and plain tea.  Eating and drinking restrictions Follow instructions from your health care provider about eating and drinking, which may include:  8 hours before the procedure - stop eating heavy meals or foods such as meat, fried foods, or fatty foods.  6 hours before the procedure - stop eating light meals or foods, such as toast or cereal.  6 hours  before the procedure - stop drinking milk or drinks that contain milk.  2 hours before the procedure - stop drinking clear liquids.  Medicines Ask your health care provider about:  Changing or stopping your regular medicines. This is especially important if you are taking diabetes medicines or blood thinners.  Taking medicines such as aspirin and ibuprofen. These medicines can thin your blood. Do not take these medicines before your procedure if your health care provider instructs you not to.  Tests and exams  You will have a physical exam.  You may have blood tests done to show: ? How well your kidneys and liver are working. ? How well your blood can clot.  General instructions  Plan to have someone take you home from the hospital or clinic.  If you will be going home right after the procedure, plan to have someone with you for 24 hours.  What happens during the procedure?  Your blood pressure, heart rate, breathing, level of pain and overall condition will be monitored.  An IV tube will be inserted into one of your veins.  Your anesthesia specialist will give you medicines as needed to keep you comfortable during the procedure. This may mean changing the level of sedation.  The procedure will be performed. After the procedure  Your blood pressure, heart rate, breathing rate, and blood oxygen level will be monitored until the medicines you were given have worn off.  Do not drive for 24 hours if you received a sedative.  You may: ? Feel sleepy, clumsy, or nauseous. ? Feel forgetful about what happened after the procedure. ? Have a sore throat if you had a breathing tube during the procedure. ? Vomit. This information is not intended to replace advice given to you by your health care provider. Make sure you discuss any questions you have with your health care provider. Document Released: 09/24/2004 Document Revised: 06/07/2015 Document Reviewed: 04/21/2015 Elsevier  Interactive Patient Education  2018 [place], Care After These instructions provide you with information about caring for  yourself after your procedure. Your health care provider may also give you more specific instructions. Your treatment has been planned according to current medical practices, but problems sometimes occur. Call your health care provider if you have any problems or questions after your procedure. What can I expect after the procedure? After your procedure, it is common to:  Feel sleepy for several hours.  Feel clumsy and have poor balance for several hours.  Feel forgetful about what happened after the procedure.  Have poor judgment for several hours.  Feel nauseous or vomit.  Have a sore throat if you had a breathing tube during the procedure.  Follow these instructions at home: For at least 24 hours after the procedure:   Do not: ? Participate in activities in which you could fall or become injured. ? Drive. ? Use heavy machinery. ? Drink alcohol. ? Take sleeping pills or medicines that cause drowsiness. ? Make important decisions or sign legal documents. ? Take care of children on your own.  Rest. Eating and drinking  Follow the diet that is recommended by your health care provider.  If you vomit, drink water, juice, or soup when you can drink without vomiting.  Make sure you have little or no nausea before eating solid foods. General instructions  Have a responsible adult stay with you until you are awake and alert.  Take over-the-counter and prescription medicines only as told by your health care provider.  If you smoke, do not smoke without supervision.  Keep all follow-up visits as told by your health care provider. This is important. Contact a health care provider if:  You keep feeling nauseous or you keep vomiting.  You feel light-headed.  You develop a rash.  You have a fever. Get help right away  if:  You have trouble breathing. This information is not intended to replace advice given to you by your health care provider. Make sure you discuss any questions you have with your health care provider. Document Released: 04/21/2015 Document Revised: 08/21/2015 Document Reviewed: 04/21/2015 Elsevier Interactive Patient Education  [employer].

## 2016-06-23 ENCOUNTER — Ambulatory Visit: Payer: Medicaid Other | Admitting: Internal Medicine

## 2016-06-24 ENCOUNTER — Other Ambulatory Visit: Payer: Self-pay | Admitting: Family Medicine

## 2016-06-24 ENCOUNTER — Telehealth: Payer: Self-pay

## 2016-06-24 DIAGNOSIS — R52 Pain, unspecified: Secondary | ICD-10-CM

## 2016-06-24 DIAGNOSIS — Z79891 Long term (current) use of opiate analgesic: Secondary | ICD-10-CM

## 2016-06-24 MED ORDER — OXYCODONE HCL 10 MG PO TABS: 10.0000 mg | ORAL_TABLET | Freq: Four times a day (QID) | ORAL | 0 refills | Status: DC | PRN

## 2016-06-24 NOTE — Progress Notes (Signed)
Reviewed [facility] [place] Substance Reporting system prior to prescribing opiate medications. No inconsistencies noted. A referral has been sent to pain management.   Meds ordered this encounter  Medications  . Oxycodone HCl 10 MG TABS    Sig: Take 1 tablet (10 mg total) by mouth every 6 (six) hours as needed.    Dispense:  60 tablet    Refill:  0    Order Specific Question:   Supervising Provider    Answer:   Tresa Garter [[id number]]    Donia Pounds  MSN, FNP-C [place] [address] [place],  [phone]

## 2016-06-24 NOTE — Telephone Encounter (Signed)
Refill request for oxycodone. Please advise. Thanks!  

## 2016-06-26 ENCOUNTER — Ambulatory Visit (HOSPITAL_COMMUNITY): Payer: Medicaid Other | Admitting: Anesthesiology

## 2016-06-26 ENCOUNTER — Ambulatory Visit (HOSPITAL_COMMUNITY)
Admission: RE | Admit: 2016-06-26 | Discharge: 2016-06-26 | Disposition: A | Discharge status: Home / Self Care | Payer: Medicaid Other | Source: Ambulatory Visit | Attending: Internal Medicine | Admitting: Internal Medicine

## 2016-06-26 ENCOUNTER — Encounter (HOSPITAL_COMMUNITY)
Admission: RE | Disposition: A | Discharge status: Home / Self Care | Payer: Self-pay | Source: Ambulatory Visit | Attending: Internal Medicine

## 2016-06-26 ENCOUNTER — Encounter (HOSPITAL_COMMUNITY): Payer: Self-pay

## 2016-06-26 DIAGNOSIS — I2510 Atherosclerotic heart disease of native coronary artery without angina pectoris: Secondary | ICD-10-CM | POA: Insufficient documentation

## 2016-06-26 DIAGNOSIS — F419 Anxiety disorder, unspecified: Secondary | ICD-10-CM | POA: Insufficient documentation

## 2016-06-26 DIAGNOSIS — Z1211 Encounter for screening for malignant neoplasm of colon: Secondary | ICD-10-CM | POA: Diagnosis present

## 2016-06-26 DIAGNOSIS — I10 Essential (primary) hypertension: Secondary | ICD-10-CM | POA: Insufficient documentation

## 2016-06-26 DIAGNOSIS — K219 Gastro-esophageal reflux disease without esophagitis: Secondary | ICD-10-CM | POA: Diagnosis not present

## 2016-06-26 DIAGNOSIS — K861 Other chronic pancreatitis: Secondary | ICD-10-CM | POA: Diagnosis not present

## 2016-06-26 DIAGNOSIS — Z860100 Personal history of colon polyps, unspecified: Secondary | ICD-10-CM | POA: Insufficient documentation

## 2016-06-26 DIAGNOSIS — Z09 Encounter for follow-up examination after completed treatment for conditions other than malignant neoplasm: Secondary | ICD-10-CM | POA: Diagnosis not present

## 2016-06-26 DIAGNOSIS — K644 Residual hemorrhoidal skin tags: Secondary | ICD-10-CM | POA: Diagnosis not present

## 2016-06-26 DIAGNOSIS — K6289 Other specified diseases of anus and rectum: Secondary | ICD-10-CM | POA: Diagnosis not present

## 2016-06-26 DIAGNOSIS — Z79899 Other long term (current) drug therapy: Secondary | ICD-10-CM | POA: Insufficient documentation

## 2016-06-26 DIAGNOSIS — J449 Chronic obstructive pulmonary disease, unspecified: Secondary | ICD-10-CM | POA: Insufficient documentation

## 2016-06-26 DIAGNOSIS — Z792 Long term (current) use of antibiotics: Secondary | ICD-10-CM

## 2016-06-26 DIAGNOSIS — F17210 Nicotine dependence, cigarettes, uncomplicated: Secondary | ICD-10-CM | POA: Insufficient documentation

## 2016-06-26 DIAGNOSIS — I739 Peripheral vascular disease, unspecified: Secondary | ICD-10-CM | POA: Insufficient documentation

## 2016-06-26 DIAGNOSIS — Z931 Gastrostomy status: Secondary | ICD-10-CM | POA: Diagnosis not present

## 2016-06-26 HISTORY — PX: COLONOSCOPY WITH PROPOFOL: SHX5780

## 2016-06-26 SURGERY — COLONOSCOPY WITH PROPOFOL
Anesthesia: Monitor Anesthesia Care

## 2016-06-26 MED ORDER — PROPOFOL 500 MG/50ML IV EMUL
INTRAVENOUS | Status: DC | PRN
  Administered 2016-06-26: 150 ug/kg/min via INTRAVENOUS

## 2016-06-26 MED ORDER — FENTANYL CITRATE (PF) 100 MCG/2ML IJ SOLN
INTRAMUSCULAR | Status: AC
  Filled 2016-06-26: qty 2

## 2016-06-26 MED ORDER — MIDAZOLAM HCL 2 MG/2ML IJ SOLN
INTRAMUSCULAR | Status: AC
  Filled 2016-06-26: qty 2

## 2016-06-26 MED ORDER — LACTATED RINGERS IV SOLN
INTRAVENOUS | Status: DC
  Administered 2016-06-26: 09:00:00 via INTRAVENOUS

## 2016-06-26 MED ORDER — PROPOFOL 10 MG/ML IV BOLUS
INTRAVENOUS | Status: AC
  Filled 2016-06-26: qty 40

## 2016-06-26 MED ORDER — FENTANYL CITRATE (PF) 100 MCG/2ML IJ SOLN
25.0000 ug | Freq: Once | INTRAMUSCULAR | Status: AC
  Administered 2016-06-26: 25 ug via INTRAVENOUS

## 2016-06-26 MED ORDER — MIDAZOLAM HCL 2 MG/2ML IJ SOLN
1.0000 mg | INTRAMUSCULAR | Status: AC
  Administered 2016-06-26: 2 mg via INTRAVENOUS

## 2016-06-26 MED ORDER — CHLORHEXIDINE GLUCONATE CLOTH 2 % EX PADS: 6.0000 | MEDICATED_PAD | Freq: Once | CUTANEOUS | Status: DC

## 2016-06-26 NOTE — Anesthesia Postprocedure Evaluation (Signed)
Anesthesia Post Note  Patient: Marcus Brooks  Procedure(s) Performed: Procedure(s) (LRB): COLONOSCOPY WITH PROPOFOL (N/A)  Patient location during evaluation: PACU Anesthesia Type: MAC Level of consciousness: awake and alert and oriented Pain management: pain level controlled Vital Signs Assessment: post-procedure vital signs reviewed and stable Respiratory status: spontaneous breathing and patient connected to face mask oxygen Cardiovascular status: stable Postop Assessment: no signs of nausea or vomiting Anesthetic complications: no     Last Vitals:  Vitals:   06/26/16 0930 06/26/16 1005  BP:  (!) 89/49  Pulse:  (!) 51  Resp: 16   Temp:  36.4 C    Last Pain:  Vitals:   06/26/16 0934  TempSrc:   PainSc: 5                  ADAMS, AMY A

## 2016-06-26 NOTE — Anesthesia Preprocedure Evaluation (Signed)
Anesthesia Evaluation  Patient identified by MRN, date of birth, ID band Patient awake    Reviewed: Allergy & Precautions, H&P , NPO status , Patient's Chart, lab work & pertinent test results  History of Anesthesia Complications Negative for: history of anesthetic complications  Airway Mallampati: II  TM Distance: >3 FB Neck ROM: full    Dental  (+) Edentulous Lower, Edentulous Upper   Pulmonary COPD,  COPD inhaler, Current Smoker,    breath sounds clear to auscultation       Cardiovascular Exercise Tolerance: Poor hypertension, Pt. on medications + CAD and + Peripheral Vascular Disease   Rhythm:Regular Rate:Normal     Neuro/Psych Seizures -, Well Controlled,  PSYCHIATRIC DISORDERS Anxiety    GI/Hepatic hiatal hernia, GERD  Medicated and Poorly Controlled,Chronic pancreatitis  Chronic pancreatitis  gtube    Endo/Other    Renal/GU      Musculoskeletal   Abdominal   Peds  Hematology  (+) Blood dyscrasia, anemia ,   Anesthesia Other Findings Chronic pain syndrome.  Reproductive/Obstetrics                             Anesthesia Physical Anesthesia Plan  ASA: III  Anesthesia Plan: MAC   Post-op Pain Management:    Induction: Intravenous  PONV Risk Score and Plan:   Airway Management Planned: Simple Face Mask  Additional Equipment:   Intra-op Plan:   Post-operative Plan:   Informed Consent: I have reviewed the patients History and Physical, chart, labs and discussed the procedure including the risks, benefits and alternatives for the proposed anesthesia with the patient or authorized representative who has indicated his/her understanding and acceptance.     Plan Discussed with:   Anesthesia Plan Comments:         Anesthesia Quick Evaluation

## 2016-06-26 NOTE — H&P (Signed)
Marcus Brooks is an 56 y.o. male.   Chief Complaint: Patient is here for colonoscopy. HPI: Patient is 56 year old Caucasian male with multiple medical problems is in for surveillance colonoscopy. His last exam was about 5 years ago. He has chronic abdominal pain due to calcific chronic pancreatitis. He denies diarrhea or rectal bleeding. He states he's been losing weight over several months. About 10 years ago he weighed 70 pounds. He is still on nocturnal supplemental gastric feeding. He eats during the daytime. He has not had any alcohol in over 10 years. He was hospitalized last month for anemia. He was hospitalized 1 month ago for anemia receive transfusion. He was felt to have upper GI bleed but declined undergoing evaluation. Hemoglobin was 10.1 on 06/05/2016.  He continues to smoke cigarettes. Family history is negative for CRC.  Past Medical History:  Diagnosis Date  . Anxiety state 08/04/2010  . Chronic airway obstruction, not elsewhere classified 08/04/2010  . Chronic pain syndrome 08/04/2010  . Chronic pancreatitis ([place]) 08/04/2010  . Coronary artery disease   . GERD (gastroesophageal reflux disease)   . Hiatal hernia    EGD 01/18/13  . Insomnia 08/04/2010  . Intellectual disability   . Lung nodule 05/20/2016  . Other abnormal glucose 08/04/2010  . Pancreatitis chronic 08/04/2010  . Protein calorie malnutrition ([place]) 2010  . Seizure ([place]) 08/04/2010  . Severe anemia 05/20/2016  . Splenic vein thrombosis    Chronic.  . Tobacco use disorder 08/04/2010  . Unspecified essential hypertension 08/04/2010    Past Surgical History:  Procedure Laterality Date  . CORONARY ANGIOPLASTY WITH STENT PLACEMENT    . ESOPHAGOGASTRODUODENOSCOPY N/A 01/18/2013   Procedure: ESOPHAGOGASTRODUODENOSCOPY (EGD);  Surgeon: Rogene Houston, MD;  [facility];  Service: Endoscopy;  Laterality: N/A;  . EUS  03/26/2009   [facility]  . GASTROSTOMY-JEJEUNOSTOMY TUBE CHANGE/PLACEMENT  12/11/2008    [facility]  . I&D EXTREMITY Left 09/12/2012   Procedure: IRRIGATION AND DEBRIDEMENT LEFT FIFTH FINGER;  Surgeon: Roseanne Kaufman, MD;  Location: [facility];  Service: Orthopedics;  Laterality: Left;  . JEJUNOSTOMY FEEDING TUBE  07/13/2010  . NERVE AND TENDON REPAIR Left 09/12/2012   Procedure: NERVE AND TENDON REPAIR LEFT FIFTH FINGER;  Surgeon: Roseanne Kaufman, MD;  Location: [facility];  Service: Orthopedics;  Laterality: Left;  . OPEN REDUCTION INTERNAL FIXATION (ORIF) METACARPAL Left 09/12/2012   Procedure: OPEN REDUCTION INTERNAL FIXATION LEFT FIFTH FINGER;  Surgeon: Roseanne Kaufman, MD;  Location: [facility];  Service: Orthopedics;  Laterality: Left;  . PANCREATIC PSEUDOCYST DRAINAGE  07/13/2010  . PORTACATH PLACEMENT  11/28/2008   [facility]    History reviewed. No pertinent family history. Social History:  reports that he has been smoking Cigarettes.  He has a 20.00 pack-year smoking history. He has never used smokeless tobacco. He reports that he does not drink alcohol or use drugs.  Allergies:  Allergies  Allergen Reactions  . Latex Rash    Medications Prior to Admission  Medication Sig Dispense Refill  . Adhesive Tape (CLOTH ADHESIVE SURG 1/2"X10YD) TAPE 1 each by Does not apply route 2 (two) times daily as needed. 1 each 2  . albuterol (PROVENTIL HFA;VENTOLIN HFA) 108 (90 BASE) MCG/ACT inhaler Inhale 2 puffs into the lungs every 6 (six) hours as needed for wheezing or shortness of breath.     . CREON 24000-76000 units CPEP TAKE TWO CAPSULES BY MOUTH BEFORE MEALS 168 each 3  . DULoxetine (CYMBALTA) 30 MG capsule Take 1 capsule (30 mg  total) by mouth 2 (two) times daily. 60 capsule 2  . ferrous sulfate 325 (65 FE) MG tablet TAKE ONE (1) TABLET BY MOUTH EVERY DAY WITH BREAKFAST 30 tablet 3  . gabapentin (NEURONTIN) 300 MG capsule Take 1 capsule (300 mg total) by mouth 4 (four) times daily as needed. (Patient taking differently: Take 600 mg by mouth 3 (three) times daily. ) 180 capsule 3  . Gauze  Pads & Dressings (BIOGUARD GAUZE SPONGES) 4"X4" PADS 1 each by Does not apply route 2 (two) times daily. 1 each 5  . hydrocortisone cream 0.5 % Apply 1 application topically 2 (two) times daily. 30 g 0  . mirtazapine (REMERON) 15 MG tablet TAKE ONE TABLET BY MOUTH EVERY EVENING 30 tablet 3  . ondansetron (ZOFRAN) 4 MG tablet Take 1 tablet (4 mg total) by mouth every 6 (six) hours as needed for nausea. 20 tablet 0  . Oxycodone HCl 10 MG TABS Take 1 tablet (10 mg total) by mouth every 6 (six) hours as needed. 60 tablet 0  . pantoprazole (PROTONIX) 40 MG tablet TAKE ONE (1) TABLET BY MOUTH EVERY DAY 30 tablet 3  . phenytoin (DILANTIN) 100 MG ER capsule Take 2 capsules (200 mg total) by mouth 2 (two) times daily. 120 capsule 0  . promethazine (PHENERGAN) 25 MG tablet Take 25 mg by mouth every 6 (six) hours as needed for nausea or vomiting.     . ranitidine (ZANTAC) 150 MG capsule TAKE 1 CAPSULE BY MOUTH TWICE DAILY (Patient taking differently: TAKE 1 CAPSULE BY MOUTH AT BEDTIME) 60 capsule 3  . vitamin C (ASCORBIC ACID) 500 MG tablet Take 1 tablet (500 mg total) by mouth daily. 30 tablet 3  . Water For Irrigation, Sterile (FREE WATER) SOLN Place 200 mLs into feeding tube every 8 (eight) hours.    Marland Kitchen ampicillin-sulbactam (UNASYN) IVPB Inject 1.5 g into the vein every 8 (eight) hours. Indication:  Acute on chronic pancreatitis with phlegmon Last Day of Therapy:  05/30/2016 Labs - Once weekly:  CBC/D and BMP, Labs - Every other week:  ESR and CRP 49 Units 0  . Nutritional Supplements (FEEDING SUPPLEMENT, VITAL AF 1.2 CAL,) LIQD Place 1,000 mLs into feeding tube daily. At 40m/hr      No results found for this or any previous visit (from the past 48 hour(s)). No results found.  ROS  Blood pressure 117/67, pulse (!) 56, temperature 97.1 F (36.2 C), temperature source Oral, resp. rate 16, height '5\' 6"'$  (1.676 m), weight 93 lb (42.2 kg), SpO2 96 %. Physical Exam  Constitutional:  Well-developed thin  Caucasian male in NAD.  HENT:  Mouth/Throat: Oropharynx is clear and moist.  He is edentulous. He has dentures but they are at home.  Eyes: Conjunctivae are normal. No scleral icterus.  Neck: No thyromegaly present.  Cardiovascular: Normal rate, regular rhythm and normal heart sounds.   No murmur heard. Respiratory: Effort normal and breath sounds normal.  GI:  Abdomen is flat and gastrostomy tube in place. Abdomen is soft without tenderness organomegaly or masses.  Musculoskeletal: He exhibits no edema.  Extremities are very thin.  Lymphadenopathy:    He has no cervical adenopathy.  Neurological: He is alert.  Skin: Skin is warm.     Assessment/Plan History of colonic adenomas. Surveillance colonoscopy under monitored anesthesia care.  NHildred Laser MD 06/26/2016, 8:42 AM

## 2016-06-26 NOTE — Transfer of Care (Signed)
Immediate Anesthesia Transfer of Care Note  Patient: Marcus Brooks  Procedure(s) Performed: Procedure(s) with comments: COLONOSCOPY WITH PROPOFOL (N/A) - 10:30  Patient Location: PACU  Anesthesia Type:MAC  Level of Consciousness: awake, alert , oriented and patient cooperative  Airway & Oxygen Therapy: Patient Spontanous Breathing and Patient connected to face mask oxygen  Post-op Assessment: Report given to RN and Post -op Vital signs reviewed and stable  Post vital signs: Reviewed and stable  Last Vitals:  Vitals:   06/26/16 0915 06/26/16 0930  BP:    Pulse:    Resp: 11 16  Temp:      Last Pain:  Vitals:   06/26/16 0934  TempSrc:   PainSc: 5          Complications: No apparent anesthesia complications

## 2016-06-26 NOTE — Op Note (Signed)
[facility] Patient Name: Marcus Brooks Procedure Date: 06/26/2016 9:12 AM MRN: [id number] Date of Birth: 06-Nov-1960 Attending MD: Hildred Laser , MD CSN: [id number] Age: 56 Admit Type: Outpatient Procedure:                Colonoscopy Indications:              High risk colon cancer surveillance: Personal                            history of colonic polyps Providers:                Hildred Laser, MD, Otis Peak B. Sharon Seller, RN, Aram Candela Referring MD:             [name], FNP Medicines:                Propofol per Anesthesia Complications:            No immediate complications. Estimated Blood Loss:     Estimated blood loss: none. Procedure:                Pre-Anesthesia Assessment:                           - Prior to the procedure, a History and Physical                            was performed, and patient medications and                            allergies were reviewed. The patient's tolerance of                            previous anesthesia was also reviewed. The risks                            and benefits of the procedure and the sedation                            options and risks were discussed with the patient.                            All questions were answered, and informed consent                            was obtained. Prior Anticoagulants: The patient has                            taken no previous anticoagulant or antiplatelet                            agents. ASA Grade Assessment: III - A patient with  severe systemic disease. After reviewing the risks                            and benefits, the patient was deemed in                            satisfactory condition to undergo the procedure.                           After obtaining informed consent, the colonoscope                            was passed under direct vision. Throughout the                            procedure, the patient's  blood pressure, pulse, and                            oxygen saturations were monitored continuously. The                            EC-[id number] ([id number]) scope was introduced through                            the and advanced to the the cecum, identified by                            appendiceal orifice and ileocecal valve. The                            colonoscopy was performed without difficulty. The                            patient tolerated the procedure well. The quality                            of the bowel preparation was adequate to identify                            polyps 6 mm and larger in size. The ileocecal                            valve, appendiceal orifice, and rectum were                            photographed. Scope In: 9:39:33 AM Scope Out: 10:00:28 AM Scope Withdrawal Time: 0 hours 6 minutes 59 seconds  Total Procedure Duration: 0 hours 20 minutes 55 seconds  Findings:      The perianal and digital rectal examinations were normal.      The colon (entire examined portion) appeared normal.      External hemorrhoids were found during retroflexion. The hemorrhoids       were small.      Anal papilla(e) were hypertrophied. Impression:               -  The entire examined colon is normal.                           - External hemorrhoids.                           - Anal papilla(e) were hypertrophied.                           - No specimens collected. Moderate Sedation:      Per Anesthesia Care Recommendation:           - Patient has a contact number available for                            emergencies. The signs and symptoms of potential                            delayed complications were discussed with the                            patient. Return to normal activities tomorrow.                            Written discharge instructions were provided to the                            patient.                           - Resume previous diet today.                            - Continue present medications.                           - Repeat colonoscopy in 5 years for surveillance. Procedure Code(s):        --- Professional ---                           [phone], Colonoscopy, flexible; diagnostic, including                            collection of specimen(s) by brushing or washing,                            when performed (separate procedure) Diagnosis Code(s):        --- Professional ---                           Z86.010, Personal history of colonic polyps                           K64.4, Residual hemorrhoidal skin tags                           K62.89, Other specified diseases of anus and rectum  CPT copyright 2016 [employer]. All rights reserved. The codes documented in this report are preliminary and upon coder review may  be revised to meet current compliance requirements. Hildred Laser, MD Hildred Laser, MD 06/26/2016 10:06:09 AM This report has been signed electronically. Number of Addenda: 0

## 2016-06-26 NOTE — Discharge Instructions (Signed)
Resume usual medications and diet. No driving for 24 hours. Next colonoscopy in 5 years.    PATIENT INSTRUCTIONS POST-ANESTHESIA  IMMEDIATELY FOLLOWING SURGERY:  Do not drive or operate machinery for the first twenty four hours after surgery.  Do not make any important decisions for twenty four hours after surgery or while taking narcotic pain medications or sedatives.  If you develop intractable nausea and vomiting or a severe headache please notify your doctor immediately.  FOLLOW-UP:  Please make an appointment with your surgeon as instructed. You do not need to follow up with anesthesia unless specifically instructed to do so.  WOUND CARE INSTRUCTIONS (if applicable):  Keep a dry clean dressing on the anesthesia/puncture wound site if there is drainage.  Once the wound has quit draining you may leave it open to air.  Generally you should leave the bandage intact for twenty four hours unless there is drainage.  If the epidural site drains for more than 36-48 hours please call the anesthesia department.  QUESTIONS?:  Please feel free to call your physician or the hospital operator if you have any questions, and they will be happy to assist you.       Colonoscopy, Adult, Care After This sheet gives you information about how to care for yourself after your procedure. Your doctor may also give you more specific instructions. If you have problems or questions, call your doctor. Follow these instructions at home: General instructions   For the first 24 hours after the procedure: ? Do not drive or use machinery. ? Do not sign important documents. ? Do not drink alcohol. ? Do your daily activities more slowly than normal. ? Eat foods that are soft and easy to digest. ? Rest often.  Take over-the-counter or prescription medicines only as told by your doctor.  It is up to you to get the results of your procedure. Ask your doctor, or the department performing the procedure, when your  results will be ready. To help cramping and bloating:  Try walking around.  Put heat on your belly (abdomen) as told by your doctor. Use a heat source that your doctor recommends, such as a moist heat pack or a heating pad. ? Put a towel between your skin and the heat source. ? Leave the heat on for 20-30 minutes. ? Remove the heat if your skin turns bright red. This is especially important if you cannot feel pain, heat, or cold. You can get burned. Eating and drinking  Drink enough fluid to keep your pee (urine) clear or pale yellow.  Return to your normal diet as told by your doctor. Avoid heavy or fried foods that are hard to digest.  Avoid drinking alcohol for as long as told by your doctor. Contact a doctor if:  You have blood in your poop (stool) 2-3 days after the procedure. Get help right away if:  You have more than a small amount of blood in your poop.  You see large clumps of tissue (blood clots) in your poop.  Your belly is swollen.  You feel sick to your stomach (nauseous).  You throw up (vomit).  You have a fever.  You have belly pain that gets worse, and medicine does not help your pain. This information is not intended to replace advice given to you by your health care provider. Make sure you discuss any questions you have with your health care provider. Document Released: 01/31/2010 Document Revised: 09/23/2015 Document Reviewed: 09/23/2015 Elsevier Interactive Patient Education  2017 [employer]   Hemorrhoids Hemorrhoids are swollen veins in and around the rectum or anus. There are two types of hemorrhoids:  Internal hemorrhoids. These occur in the veins that are just inside the rectum. They may poke through to the outside and become irritated and painful.  External hemorrhoids. These occur in the veins that are outside of the anus and can be felt as a painful swelling or hard lump near the anus.  Most hemorrhoids do not cause serious problems, and  they can be managed with home treatments such as diet and lifestyle changes. If home treatments do not help your symptoms, procedures can be done to shrink or remove the hemorrhoids. What are the causes? This condition is caused by increased pressure in the anal area. This pressure may result from various things, including:  Constipation.  Straining to have a bowel movement.  Diarrhea.  Pregnancy.  Obesity.  Sitting for long periods of time.  Heavy lifting or other activity that causes you to strain.  Anal sex.  What are the signs or symptoms? Symptoms of this condition include:  Pain.  Anal itching or irritation.  Rectal bleeding.  Leakage of stool (feces).  Anal swelling.  One or more lumps around the anus.  How is this diagnosed? This condition can often be diagnosed through a visual exam. Other exams or tests may also be done, such as:  Examination of the rectal area with a gloved hand (digital rectal exam).  Examination of the anal canal using a small tube (anoscope).  A blood test, if you have lost a significant amount of blood.  A test to look inside the colon (sigmoidoscopy or colonoscopy).  How is this treated? This condition can usually be treated at home. However, various procedures may be done if dietary changes, lifestyle changes, and other home treatments do not help your symptoms. These procedures can help make the hemorrhoids smaller or remove them completely. Some of these procedures involve surgery, and others do not. Common procedures include:  Rubber band ligation. Rubber bands are placed at the base of the hemorrhoids to cut off the blood supply to them.  Sclerotherapy. Medicine is injected into the hemorrhoids to shrink them.  Infrared coagulation. A type of light energy is used to get rid of the hemorrhoids.  Hemorrhoidectomy surgery. The hemorrhoids are surgically removed, and the veins that supply them are tied off.  Stapled  hemorrhoidopexy surgery. A circular stapling device is used to remove the hemorrhoids and use staples to cut off the blood supply to them.  Follow these instructions at home: Eating and drinking  Eat foods that have a lot of fiber in them, such as whole grains, beans, nuts, fruits, and vegetables. Ask your health care provider about taking products that have added fiber (fiber supplements).  Drink enough fluid to keep your urine clear or pale yellow. Managing pain and swelling  Take warm sitz baths for 20 minutes, 3-4 times a day to ease pain and discomfort.  If directed, apply ice to the affected area. Using ice packs between sitz baths may be helpful. ? Put ice in a plastic bag. ? Place a towel between your skin and the bag. ? Leave the ice on for 20 minutes, 2-3 times a day. General instructions  Take over-the-counter and prescription medicines only as told by your health care provider.  Use medicated creams or suppositories as told.  Exercise regularly.  Go to the bathroom when you have the urge to have  a bowel movement. Do not wait.  Avoid straining to have bowel movements.  Keep the anal area dry and clean. Use wet toilet paper or moist towelettes after a bowel movement.  Do not sit on the toilet for long periods of time. This increases blood pooling and pain. Contact a health care provider if:  You have increasing pain and swelling that are not controlled by treatment or medicine.  You have uncontrolled bleeding.  You have difficulty having a bowel movement, or you are unable to have a bowel movement.  You have pain or inflammation outside the area of the hemorrhoids. This information is not intended to replace advice given to you by your health care provider. Make sure you discuss any questions you have with your health care provider. Document Released: 12/27/1999 Document Revised: 05/29/2015 Document Reviewed: 09/12/2014 Elsevier Interactive Patient Education  2017  [employer].

## 2016-06-26 NOTE — Anesthesia Procedure Notes (Signed)
Procedure Name: MAC Date/Time: 06/26/2016 9:31 AM Performed by: Andree Elk, Reinaldo Helt A Pre-anesthesia Checklist: Patient identified, Emergency Drugs available, Suction available, Patient being monitored and Timeout performed Oxygen Delivery Method: Simple face mask

## 2016-06-29 NOTE — Progress Notes (Signed)
Patient was called for routine post op follow up call. Patient stated that he has been unable to eat or drink anything since having procedure done on Friday. Has been vomiting up everything he takes in orally. Discussed with Dr. [name]. Patient informed that he needs to come to the emergency room to be examined.

## 2016-07-01 ENCOUNTER — Encounter (HOSPITAL_COMMUNITY): Payer: Self-pay | Admitting: Internal Medicine

## 2016-07-03 ENCOUNTER — Ambulatory Visit: Payer: Medicaid Other | Admitting: Family Medicine

## 2016-07-10 ENCOUNTER — Other Ambulatory Visit: Payer: Medicaid Other

## 2016-07-10 ENCOUNTER — Ambulatory Visit (INDEPENDENT_AMBULATORY_CARE_PROVIDER_SITE_OTHER): Payer: Medicaid Other | Admitting: Family Medicine

## 2016-07-10 ENCOUNTER — Encounter: Payer: Self-pay | Admitting: Family Medicine

## 2016-07-10 VITALS — BP 143/76 | HR 93 | Temp 98.6°F | Resp 16 | Ht 66.0 in | Wt 99.0 lb

## 2016-07-10 DIAGNOSIS — R911 Solitary pulmonary nodule: Secondary | ICD-10-CM | POA: Diagnosis not present

## 2016-07-10 DIAGNOSIS — G40909 Epilepsy, unspecified, not intractable, without status epilepticus: Secondary | ICD-10-CM | POA: Diagnosis not present

## 2016-07-10 DIAGNOSIS — F17200 Nicotine dependence, unspecified, uncomplicated: Secondary | ICD-10-CM

## 2016-07-10 DIAGNOSIS — Z79891 Long term (current) use of opiate analgesic: Secondary | ICD-10-CM

## 2016-07-10 DIAGNOSIS — R5383 Other fatigue: Secondary | ICD-10-CM

## 2016-07-10 DIAGNOSIS — R52 Pain, unspecified: Secondary | ICD-10-CM | POA: Diagnosis not present

## 2016-07-10 LAB — CBC WITH DIFFERENTIAL/PLATELET
BASOS ABS: 106 {cells}/uL (ref 0–200)
Basophils Relative: 1 %
Eosinophils Absolute: 530 cells/uL — ABNORMAL HIGH (ref 15–500)
Eosinophils Relative: 5 %
HEMATOCRIT: 32.3 % — AB (ref 38.5–50.0)
Hemoglobin: 10.9 g/dL — ABNORMAL LOW (ref 13.2–17.1)
LYMPHS PCT: 31 %
Lymphs Abs: 3286 cells/uL (ref 850–3900)
MCH: 31.6 pg (ref 27.0–33.0)
MCHC: 33.7 g/dL (ref 32.0–36.0)
MCV: 93.6 fL (ref 80.0–100.0)
MONO ABS: 636 {cells}/uL (ref 200–950)
MPV: 8.1 fL (ref 7.5–12.5)
Monocytes Relative: 6 %
NEUTROS PCT: 57 %
Neutro Abs: 6042 cells/uL (ref 1500–7800)
Platelets: 312 10*3/uL (ref 140–400)
RBC: 3.45 MIL/uL — AB (ref 4.20–5.80)
RDW: 13.8 % (ref 11.0–15.0)
WBC: 10.6 10*3/uL (ref 3.8–10.8)

## 2016-07-10 LAB — COMPLETE METABOLIC PANEL WITH GFR
ALBUMIN: 3.7 g/dL (ref 3.6–5.1)
ALK PHOS: 116 U/L — AB (ref 40–115)
ALT: 11 U/L (ref 9–46)
AST: 18 U/L (ref 10–35)
BUN: 9 mg/dL (ref 7–25)
CALCIUM: 8.8 mg/dL (ref 8.6–10.3)
CHLORIDE: 107 mmol/L (ref 98–110)
CO2: 23 mmol/L (ref 20–31)
Creat: 0.56 mg/dL — ABNORMAL LOW (ref 0.70–1.33)
GFR, Est Non African American: >89 mL/min (ref >=60)
Glucose, Bld: 80 mg/dL (ref 65–99)
POTASSIUM: 4.5 mmol/L (ref 3.5–5.3)
Sodium: 136 mmol/L (ref 135–146)
Total Bilirubin: 0.2 mg/dL (ref 0.2–1.2)
Total Protein: 6.9 g/dL (ref 6.1–8.1)

## 2016-07-10 LAB — PHENYTOIN LEVEL, TOTAL: Phenytoin Lvl: 22.9 mg/L — ABNORMAL HIGH (ref 10.0–20.0)

## 2016-07-10 MED ORDER — OXYCODONE HCL 10 MG PO TABS: 10.0000 mg | ORAL_TABLET | Freq: Four times a day (QID) | ORAL | 0 refills | Status: DC | PRN

## 2016-07-10 NOTE — Progress Notes (Signed)
Subjective:    Patient ID: Marcus Brooks, male    DOB: 1960/04/04, 56 y.o.   MRN: [id number]  HPI Mr. Marcus Brooks, a 56 year old male with a history of seizure disorder, chronic pancreatitis, and poor nutrition and complains of generalized pain. Current pain intensity is 8/10 and constant. He has been taking Oxycodone 10 mg every 6 hours as needed for moderate to severe pain. He has also been referred to pain management. Mr. Marcus Brooks has had sustained relief on pain medication regimen. He has also been referred to pain management. Mr. Marcus Brooks  had a chest CT on 04/28/2016, which shows a 1.4 x 1.7 cm nodule along the left lateral margin of the aortic large. The nodule is isodense with the aorta. Left hilar and AP window lymphadenopathy. Overall appearance is most concerning for primary lung malignancy. A referral was sent to Dr. Julien Brooks at [facility] for further work up and evaluation. A PET scan was performed on 06/18/2016, he had a follow up scheduled on 06/23/2016 to discuss finding. He did not show up for follow up appointment. He says that he did not know about appointment. He continues to smoke 1 pack per day and has had a weight loss greater than 50 pounds.   Immunization History  Administered Date(s) Administered  . Influenza,inj,Quad PF,36+ Mos 01/10/2013  . PPD Test 01/27/2013  . Pneumococcal Polysaccharide-23 06/11/2015  . Tdap 09/12/2012   Past Surgical History:  Procedure Laterality Date  . COLONOSCOPY WITH PROPOFOL N/A 06/26/2016   Procedure: COLONOSCOPY WITH PROPOFOL;  Surgeon: Marcus Houston, MD;  [facility];  Service: Endoscopy;  Laterality: N/A;  10:30  . CORONARY ANGIOPLASTY WITH STENT PLACEMENT    . ESOPHAGOGASTRODUODENOSCOPY N/A 01/18/2013   Procedure: ESOPHAGOGASTRODUODENOSCOPY (EGD);  Surgeon: Marcus Houston, MD;  [facility];  Service: Endoscopy;  Laterality: N/A;  . EUS  03/26/2009   [facility]  . GASTROSTOMY-JEJEUNOSTOMY  TUBE CHANGE/PLACEMENT  12/11/2008   Marcus Brooks  . I&D EXTREMITY Left 09/12/2012   Procedure: IRRIGATION AND DEBRIDEMENT LEFT FIFTH FINGER;  Surgeon: Marcus Kaufman, MD;  Location: [place];  Service: Orthopedics;  Laterality: Left;  . JEJUNOSTOMY FEEDING TUBE  07/13/2010  . NERVE AND TENDON REPAIR Left 09/12/2012   Procedure: NERVE AND TENDON REPAIR LEFT FIFTH FINGER;  Surgeon: Marcus Kaufman, MD;  Location: [place];  Service: Orthopedics;  Laterality: Left;  . OPEN REDUCTION INTERNAL FIXATION (ORIF) METACARPAL Left 09/12/2012   Procedure: OPEN REDUCTION INTERNAL FIXATION LEFT FIFTH FINGER;  Surgeon: Marcus Kaufman, MD;  Location: [place];  Service: Orthopedics;  Laterality: Left;  . PANCREATIC PSEUDOCYST DRAINAGE  07/13/2010  . PORTACATH PLACEMENT  11/28/2008   Marcus Brooks    Review of Systems  Constitutional: Negative.  Negative for fatigue and fever.  HENT: Negative.   Eyes: Negative.  Negative for photophobia.  Respiratory: Negative.  Negative for chest tightness.   Cardiovascular: Negative.  Negative for palpitations and leg swelling.  Gastrointestinal: Positive for abdominal pain.  Endocrine: Negative.  Negative for polydipsia, polyphagia and polyuria.  Genitourinary: Negative.   Musculoskeletal: Negative.   Skin: Negative.   Allergic/Immunologic: Negative.  Negative for immunocompromised state.  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  Hematological: Negative.   Psychiatric/Behavioral: Positive for sleep disturbance.       Objective:   Physical Exam  Constitutional: He is oriented to person, place, and time. He appears well-developed. He appears cachectic. He has a sickly  appearance.  HENT:  Head: Normocephalic and atraumatic.  Right Ear: External ear normal.  Left Ear: External ear normal.  Nose: Nose normal.  Mouth/Throat: Oropharynx is clear and moist. Abnormal dentition (partially edentulous).  Eyes: Conjunctivae and EOM are  normal. Pupils are equal, round, and reactive to light.  Neck: Trachea normal and normal range of motion. Neck supple.  Cardiovascular: Normal rate, regular rhythm, normal heart sounds and intact distal pulses.   Pulmonary/Chest: Effort normal and breath sounds normal.  Abdominal: Soft. Bowel sounds are normal. There is tenderness in the right upper quadrant and left upper quadrant.  LUG PEG tube, pus, or edema, tender to palpation.  Mild erythema  Musculoskeletal: Normal range of motion.  Neurological: He is oriented to person, place, and time. He has normal reflexes. He is unresponsive.  Skin: Skin is warm, dry and intact. No rash noted. No cyanosis. There is pallor. Nails show no clubbing.  Psychiatric: His speech is normal and behavior is normal. Thought content normal. He exhibits a depressed mood.      BP (!) 143/76 (BP Location: Left Arm, Patient Position: Sitting, Cuff Size: Small)   Pulse 93   Temp 98.6 F (37 C) (Oral)   Resp 16   Ht 5\' 6"  (1.676 m)   Wt 99 lb (44.9 kg)   SpO2 98%   BMI 15.98 kg/m  Assessment & Plan:    2. Generalized pain Reviewed [place] Substance Reporting system prior to prescribing opiate medications. No inconsistencies noted.  -Acute and chronic painful episodes - We agreed on Oxycodone. We discussed that pt is to receive his Schedule II prescriptions here. Pt is also aware that the prescription history is available to [place] online through the [facility] CSRS. Controlled substance agreement signed 06/02/2016. We reminded Marcus Brooks that all patients receiving Schedule II narcotics must be seen for follow-up within one month of prescription being requested. We reviewed the terms of our pain agreement, including the need to keep medicines in a safe locked location away from children or pets, and the need to report excess sedation or constipation, measures to avoid constipation, and policies related to early refills and stolen prescriptions. According to the [place] Chronic Pain  Initiative program, we have reviewed details related to analgesia, adverse effects, aberrant behaviors. - Oxycodone HCl 10 MG TABS; Take 1 tablet (10 mg total) by mouth every 6 (six) hours as needed.  Dispense: 60 tablet; Refill: 0  2. Chronic prescription opiate use - Oxycodone HCl 10 MG TABS; Take 1 tablet (10 mg total) by mouth every 6 (six) hours as needed.  Dispense: 60 tablet; Refill: 0  3. Seizure disorder (HCC) - Phenytoin level, total  4. Lung nodule I called Dr. Worthy Flank office to schedule a follow up appointment to discuss PET scan. Appointment was scheduled for July 2 at 9:15 a.m. Discussed the importance of following up with Oncology and scheduled patient expressed understanding    5. Fatigue, unspecified type  - CBC with Differential - COMPLETE METABOLIC PANEL WITH GFR    6. Tobacco use disorder Smoking cessation instruction/counseling given:  counseled patient on the dangers of tobacco use, advised patient to stop smoking, and reviewed strategies to maximize success   Donia Pounds  MSN, FNP-C [address], [place] [address] [phone]

## 2016-07-12 NOTE — Patient Instructions (Signed)
Will continue medications as previously prescribed. Will follow up by phone with any abnormal laboratory findings. Patient will follow up on Monday July 2 with Dr. Julien Nordmann to discuss scan results. Patient will follow up with me in one month for medication management.

## 2016-07-13 ENCOUNTER — Ambulatory Visit (HOSPITAL_BASED_OUTPATIENT_CLINIC_OR_DEPARTMENT_OTHER): Payer: Medicaid Other | Admitting: Internal Medicine

## 2016-07-13 ENCOUNTER — Encounter: Payer: Self-pay | Admitting: *Deleted

## 2016-07-13 ENCOUNTER — Encounter: Payer: Self-pay | Admitting: Internal Medicine

## 2016-07-13 VITALS — BP 116/77 | HR 77 | Temp 97.8°F | Resp 20 | Ht 66.0 in | Wt 98.3 lb

## 2016-07-13 DIAGNOSIS — E46 Unspecified protein-calorie malnutrition: Secondary | ICD-10-CM | POA: Diagnosis not present

## 2016-07-13 DIAGNOSIS — R52 Pain, unspecified: Secondary | ICD-10-CM

## 2016-07-13 DIAGNOSIS — E440 Moderate protein-calorie malnutrition: Secondary | ICD-10-CM

## 2016-07-13 DIAGNOSIS — R911 Solitary pulmonary nodule: Secondary | ICD-10-CM

## 2016-07-13 DIAGNOSIS — G894 Chronic pain syndrome: Secondary | ICD-10-CM

## 2016-07-13 DIAGNOSIS — J449 Chronic obstructive pulmonary disease, unspecified: Secondary | ICD-10-CM

## 2016-07-13 NOTE — Progress Notes (Signed)
Oncology Nurse Navigator Documentation  Oncology Nurse Navigator Flowsheets 07/13/2016  Navigator Location [facility]  Navigator Encounter Type Clinic/MDC/per Dr. Julien Nordmann, I made referral to [facility] for patient to be seen for possible bronchoscopy for tissue DX.  I also updated scheduler at [place].    Patient Visit Type MedOnc  Treatment Phase Abnormal Scans  Barriers/Navigation Needs Coordination of Care  Interventions Coordination of Care  Coordination of Care Appts  Acuity Level 2  Acuity Level 2 Assistance expediting appointments  Time Spent with Patient 30

## 2016-07-13 NOTE — Progress Notes (Signed)
Marcus [phone]   [phone]  OFFICE PROGRESS NOTE  [name], FNP [address] [name] [address]  DIAGNOSIS: Suspicious stage IIIA (T1b, N2, M0) lung cancer, pending tissue diagnosis.  PRIOR THERAPY: None  CURRENT THERAPY: None  INTERVAL HISTORY: Marcus Brooks 56 y.o. male returns to the clinic today for follow-up visit accompanied by a family member. The patient is feeling fine today with no specific complaints. The patient denied having any current chest pain but has shortness breath with exertion with mild cough and no hemoptysis. He denied having any fever or chills. He has no nausea, vomiting, diarrhea or constipation. He continues to have malnutrition. He denied having any bleeding issues. He has occasional headache. He had a PET scan performed recently and he is here today for evaluation and discussion of his scan results and treatment options.  MEDICAL HISTORY: Past Medical History:  Diagnosis Date  . Anxiety states 08/04/2010  . Chronic airway obstruction, not elsewhere classified 08/04/2010  . Chronic pain syndrome 08/04/2010  . Chronic pancreatitis ([place]) 08/04/2010  . Coronary artery disease   . GERD (gastroesophageal reflux disease)   . Hiatal hernia    EGD 01/18/13  . Insomnia 08/04/2010  . Intellectual disability   . Lung nodule 05/20/2016  . Other abnormal glucose 08/04/2010  . Pancreatitis chronic 08/04/2010  . Protein calorie malnutrition ([place]) 2010  . Seizure ([place]) 08/04/2010  . Severe anemia 05/20/2016  . Splenic vein thrombosis    Chronic.  . Tobacco use disorder 08/04/2010  . Unspecified essential hypertension 08/04/2010    ALLERGIES:  is allergic to latex.  MEDICATIONS:  Current Outpatient Prescriptions  Medication Sig Dispense Refill  . Adhesive Tape (CLOTH ADHESIVE SURG 1/2"X10YD) TAPE 1 each by Does not apply route 2 (two) times daily as needed. 1 each 2  . albuterol (PROVENTIL  HFA;VENTOLIN HFA) 108 (90 BASE) MCG/ACT inhaler Inhale 2 puffs into the lungs every 6 (six) hours as needed for wheezing or shortness of breath.     . CREON 24000-76000 units CPEP TAKE TWO CAPSULES BY MOUTH BEFORE MEALS 168 each 3  . DULoxetine (CYMBALTA) 30 MG capsule Take 1 capsule (30 mg total) by mouth 2 (two) times daily. 60 capsule 2  . ferrous sulfate 325 (65 FE) MG tablet TAKE ONE (1) TABLET BY MOUTH EVERY DAY WITH BREAKFAST 30 tablet 3  . gabapentin (NEURONTIN) 300 MG capsule Take 1 capsule (300 mg total) by mouth 4 (four) times daily as needed. (Patient taking differently: Take 600 mg by mouth 3 (three) times daily. ) 180 capsule 3  . Gauze Pads & Dressings (BIOGUARD GAUZE SPONGES) 4"X4" PADS 1 each by Does not apply route 2 (two) times daily. 1 each 5  . hydrocortisone cream 0.5 % Apply 1 application topically 2 (two) times daily. 30 g 0  . mirtazapine (REMERON) 15 MG tablet TAKE ONE TABLET BY MOUTH EVERY EVENING 30 tablet 3  . Nutritional Supplements (FEEDING SUPPLEMENT, VITAL AF 1.2 CAL,) LIQD Place 1,000 mLs into feeding tube daily. At 47ml/hr    . ondansetron (ZOFRAN) 4 MG tablet Take 1 tablet (4 mg total) by mouth every 6 (six) hours as needed for nausea. 20 tablet 0  . Oxycodone HCl 10 MG TABS Take 1 tablet (10 mg total) by mouth every 6 (six) hours as needed. 60 tablet 0  . pantoprazole (PROTONIX) 40 MG tablet TAKE ONE (1) TABLET BY MOUTH EVERY DAY 30  tablet 3  . phenytoin (DILANTIN) 100 MG ER capsule Take 2 capsules (200 mg total) by mouth 2 (two) times daily. 120 capsule 0  . promethazine (PHENERGAN) 25 MG tablet Take 25 mg by mouth every 6 (six) hours as needed for nausea or vomiting.     . ranitidine (ZANTAC) 150 MG capsule TAKE 1 CAPSULE BY MOUTH TWICE DAILY (Patient taking differently: TAKE 1 CAPSULE BY MOUTH AT BEDTIME) 60 capsule 3  . vitamin C (ASCORBIC ACID) 500 MG tablet Take 1 tablet (500 mg total) by mouth daily. 30 tablet 3  . Water For Irrigation, Sterile (FREE WATER)  SOLN Place 200 mLs into feeding tube every 8 (eight) hours.     No current facility-administered medications for this visit.     SURGICAL HISTORY:  Past Surgical History:  Procedure Laterality Date  . COLONOSCOPY WITH PROPOFOL N/A 06/26/2016   Procedure: COLONOSCOPY WITH PROPOFOL;  Surgeon: Rogene Houston, MD;  [facility];  Service: Endoscopy;  Laterality: N/A;  10:30  . CORONARY ANGIOPLASTY WITH STENT PLACEMENT    . ESOPHAGOGASTRODUODENOSCOPY N/A 01/18/2013   Procedure: ESOPHAGOGASTRODUODENOSCOPY (EGD);  Surgeon: Rogene Houston, MD;  [facility];  Service: Endoscopy;  Laterality: N/A;  . EUS  03/26/2009   [facility]  . GASTROSTOMY-JEJEUNOSTOMY TUBE CHANGE/PLACEMENT  12/11/2008   [facility]  . I&D EXTREMITY Left 09/12/2012   Procedure: IRRIGATION AND DEBRIDEMENT LEFT FIFTH FINGER;  Surgeon: Roseanne Kaufman, MD;  Location: [place];  Service: Orthopedics;  Laterality: Left;  . JEJUNOSTOMY FEEDING TUBE  07/13/2010  . NERVE AND TENDON REPAIR Left 09/12/2012   Procedure: NERVE AND TENDON REPAIR LEFT FIFTH FINGER;  Surgeon: Roseanne Kaufman, MD;  Location: [place];  Service: Orthopedics;  Laterality: Left;  . OPEN REDUCTION INTERNAL FIXATION (ORIF) METACARPAL Left 09/12/2012   Procedure: OPEN REDUCTION INTERNAL FIXATION LEFT FIFTH FINGER;  Surgeon: Roseanne Kaufman, MD;  Location: [place];  Service: Orthopedics;  Laterality: Left;  . PANCREATIC PSEUDOCYST DRAINAGE  07/13/2010  . PORTACATH PLACEMENT  11/28/2008   FLEISHMAN    REVIEW OF SYSTEMS:  Constitutional: positive for fatigue Eyes: negative Ears, nose, mouth, throat, and face: negative Respiratory: positive for cough and dyspnea on exertion Cardiovascular: negative Gastrointestinal: negative Genitourinary:negative Integument/breast: negative Hematologic/lymphatic: negative Musculoskeletal:negative Neurological: negative Behavioral/Psych: negative Endocrine: negative Allergic/Immunologic: negative   PHYSICAL  EXAMINATION: General appearance: alert, cooperative, fatigued and no distress Head: Normocephalic, without obvious abnormality, atraumatic Neck: no adenopathy, no JVD, supple, symmetrical, trachea midline and thyroid not enlarged, symmetric, no tenderness/mass/nodules Lymph nodes: Cervical, supraclavicular, and axillary nodes normal. Resp: clear to auscultation bilaterally Back: symmetric, no curvature. ROM normal. No CVA tenderness. Cardio: regular rate and rhythm, S1, S2 normal, no murmur, click, rub or gallop GI: soft, non-tender; bowel sounds normal; no masses,  no organomegaly Extremities: extremities normal, atraumatic, no cyanosis or edema Neurologic: Alert and oriented X 3, normal strength and tone. Normal symmetric reflexes. Normal coordination and gait  ECOG PERFORMANCE STATUS: 1 - Symptomatic but completely ambulatory  Blood pressure 116/77, pulse 77, temperature 97.8 F (36.6 C), temperature source Oral, resp. rate 20, height 5\' 6"  (1.676 m), weight 98 lb 4.8 oz (44.6 kg), SpO2 96 %.  LABORATORY DATA: Lab Results  Component Value Date   WBC 10.6 07/10/2016   HGB 10.9 (L) 07/10/2016   HCT 32.3 (L) 07/10/2016   MCV 93.6 07/10/2016   PLT 312 07/10/2016      Chemistry      Component Value Date/Time   NA  136 07/10/2016 1420   NA 137 05/20/2016 1416   K 4.5 07/10/2016 1420   K 3.9 05/20/2016 1416   CL 107 07/10/2016 1420   CO2 23 07/10/2016 1420   CO2 24 05/20/2016 1416   BUN 9 07/10/2016 1420   BUN 6.9 (L) 05/20/2016 1416   CREATININE 0.56 (L) 07/10/2016 1420   CREATININE 0.6 (L) 05/20/2016 1416      Component Value Date/Time   CALCIUM 8.8 07/10/2016 1420   CALCIUM 8.7 05/20/2016 1416   ALKPHOS 116 (H) 07/10/2016 1420   ALKPHOS 81 05/20/2016 1416   AST 18 07/10/2016 1420   AST 18 05/20/2016 1416   ALT 11 07/10/2016 1420   ALT 15 05/20/2016 1416   BILITOT 0.2 07/10/2016 1420   BILITOT <0.22 05/20/2016 1416       RADIOGRAPHIC STUDIES: Ct Abdomen Pelvis W  Contrast  Result Date: 06/16/2016 CLINICAL DATA:  Phlegmon of pancreas, chronic pancreatitis, EXAM: CT ABDOMEN AND PELVIS WITH CONTRAST TECHNIQUE: Multidetector CT imaging of the abdomen and pelvis was performed using the standard protocol following bolus administration of intravenous contrast. Sagittal and coronal MPR images reconstructed from axial data set. CONTRAST:  44mL ISOVUE-300 IOPAMIDOL (ISOVUE-300) INJECTION 61% IV. Dilute oral contrast. COMPARISON:  05/09/2016 FINDINGS: Lower chest: Lung bases appear hyperinflated but clear. Hepatobiliary: Minimal central intrahepatic biliary dilatation. No CBD dilatation. Calcified granulomata within liver. Liver and gallbladder otherwise normal appearance. Pancreas: Numerous pancreatic parenchymal calcifications consistent with chronic calcific pancreatitis. Poor definition of pancreatic margins and celiac axis planes consistent with acute on chronic pancreatitis. No discrete pancreatic pseudocyst or mass. No ductal dilatation. Spleen: Numerous calcified splenic granulomata.  No splenic mass Adrenals/Urinary Tract: Adrenal glands unremarkable. Kidneys, ureters and bladder normal appearance Stomach/Bowel: Gastrostomy tube. Proximal stomach appears decompressed, with suboptimal assessment of gastric wall thickness. Suspect mild thickening of pylorus and duodenal bulb. No gastric outlet obstruction. Large and small bowel loops unremarkable. Appendix not visualized. Vascular/Lymphatic: Atherosclerotic calcifications aorta with high-grade stenosis versus occlusion of the RIGHT common iliac artery. Additional significant narrowing at the celiac and LEFT renal artery origins. Aorta normal caliber. Few pelvic phleboliths. No adenopathy. Splenic, superior mesenteric and portal veins appear patent. Reproductive: Minimal prostatic enlargement. Other: No free air free fluid.  No hernia. Musculoskeletal: Osseous structures unremarkable. IMPRESSION: Acute on chronic calcific  pancreatitis. No evidence of pseudocyst or identified vascular complication. Significant atherosclerotic disease with high-grade stenosis versus occlusion of the RIGHT common iliac artery and appearance stenoses at the celiac and LEFT renal artery origins. Electronically Signed   By: Lavonia Dana M.D.   On: 06/16/2016 15:33   Nm Pet Image Initial (pi) Skull Base To Thigh  Result Date: 06/18/2016 CLINICAL DATA:  Initial treatment strategy for solitary pulmonary nodule. EXAM: NUCLEAR MEDICINE PET SKULL BASE TO THIGH TECHNIQUE: 5.0 mCi F-18 FDG was injected intravenously. Full-ring PET imaging was performed from the skull base to thigh after the radiotracer. CT data was obtained and used for attenuation correction and anatomic localization. FASTING BLOOD GLUCOSE:  Value: 100 mg/dl COMPARISON:  CT chest dated 04/28/2016. CT abdomen/ pelvis dated 06/16/2016. FINDINGS: NECK No hypermetabolic lymph nodes in the neck. CHEST 2.2 x 2.0 cm nodule in the medial left upper lobe adjacent to the aortic arch (series 8/ image 22), max SUV 10.1, compatible with primary bronchogenic neoplasm. This previously measured 1.8 x 1.6 cm when measured in a similar fashion. 2.1 cm AP window nodal metastasis (series 4/image 67), max SUV 8.9. This previously measured 1.8 cm when measured in  a similar fashion. Hypermetabolism in the left perihilar region, max SUV 5.6. The heart is normal in size. No pericardial effusion. Coronary atherosclerosis of the LAD. ABDOMEN/PELVIS No abnormal hypermetabolic activity within the liver, pancreas, adrenal glands, or spleen. No hypermetabolic lymph nodes in the abdomen or pelvis. Additional findings in the abdomen/ pelvis are better evaluated on recent enhanced CT abdomen pelvis dated 06/16/2016. Chronic calcific pancreatitis. Splenic and hepatic granulomata. Gastrostomy tube. Atherosclerotic calcifications of the abdominal aorta and branch vessels. SKELETON No focal hypermetabolic activity to suggest  skeletal metastasis. IMPRESSION: Hypermetabolic 2.2 cm medial left upper lobe nodule, increased from prior CT, compatible with primary bronchogenic neoplasm. Associated AP window nodal metastasis, progressed from recent CT. Suspected left perihilar nodal metastasis. No evidence of metastatic disease in the neck, abdomen, or pelvis. Electronically Signed   By: Julian Hy M.D.   On: 06/18/2016 07:21    ASSESSMENT AND PLAN: This is a very pleasant 56 years old white male with highly suspicious of stage IIIa (T1b, N2, M0) lung cancer presented with left upper lobe lung nodule in addition to mediastinal lymphadenopathy. The patient has not tissue diagnosis so far. I personally and independently reviewed the PET scan images and discussed the results with the patient and his family member today. I recommended for the patient to see cardiothoracic surgery for consideration of video bronchoscopy with endobronchial ultrasound and biopsy of the left upper lobe lung mass as well as the mediastinal lymphadenopathy. I would see the patient back for follow-up visit after his biopsy for reevaluation and discussion of his treatment options which likely be a course of concurrent chemoradiation. For COPD, the patient will continue with his current treatment with inhalers. For pain management he is currently on oxycodone. For the malnutrition, I strongly encouraged the patient to increase his oral intake and I will consider referring him to the dietitian at the [facility] for reevaluation. The patient was advised to call immediately if he has any concerning symptoms in the interval. The patient voices understanding of current disease status and treatment options and is in agreement with the current care plan. All questions were answered. The patient knows to call the clinic with any problems, questions or concerns. We can certainly see the patient much sooner if necessary.  Disclaimer: This note was dictated  with voice recognition software. Similar sounding words can inadvertently be transcribed and may not be corrected upon review.

## 2016-07-14 ENCOUNTER — Telehealth: Payer: Self-pay | Admitting: Internal Medicine

## 2016-07-14 NOTE — Telephone Encounter (Signed)
No additional appts added per 7/2 los - We will arrange the patient follow-up visit after his biopsy.

## 2016-07-15 ENCOUNTER — Telehealth: Payer: Self-pay | Admitting: Family Medicine

## 2016-07-15 DIAGNOSIS — G40909 Epilepsy, unspecified, not intractable, without status epilepticus: Secondary | ICD-10-CM

## 2016-07-15 MED ORDER — PHENYTOIN SODIUM EXTENDED 100 MG PO CAPS: 200.0000 mg | ORAL_CAPSULE | Freq: Every day | ORAL | 5 refills | Status: DC

## 2016-07-15 NOTE — Telephone Encounter (Signed)
Mr. Marcus Brooks, a 56 year old male with a history of seizure disorder has an elevated dilantin level. I will change Dilantin to 200 mg HS. Will re-check dilantin level in 1 month.   Meds ordered this encounter  Medications  . phenytoin (DILANTIN) 100 MG ER capsule    Sig: Take 2 capsules (200 mg total) by mouth at bedtime.    Dispense:  60 capsule    Refill:  [place]  MSN, FNP-C [facility] [address] [place], [place] [address] [phone]

## 2016-07-16 NOTE — Telephone Encounter (Signed)
Called again @ 3:45pm. No answer.

## 2016-07-16 NOTE — Telephone Encounter (Signed)
Called, no answer. Left message to return call. Will try later.

## 2016-07-17 NOTE — Telephone Encounter (Signed)
Called home phone no answer and voicemail was full. Called cell phone number and no answer. Will try later. Thanks!

## 2016-07-20 NOTE — Telephone Encounter (Signed)
Called, phone went stright to voicemail. I have tried to call several times and several days with no success. I will mail letter informing patient of [place]'s instructions.

## 2016-07-23 ENCOUNTER — Other Ambulatory Visit: Payer: Self-pay | Admitting: *Deleted

## 2016-07-23 ENCOUNTER — Encounter (HOSPITAL_COMMUNITY): Payer: Self-pay | Admitting: *Deleted

## 2016-07-23 ENCOUNTER — Ambulatory Visit
Admission: RE | Admit: 2016-07-23 | Discharge: 2016-07-23 | Disposition: A | Discharge status: Home / Self Care | Payer: Medicaid Other | Source: Ambulatory Visit | Attending: Cardiothoracic Surgery | Admitting: Cardiothoracic Surgery

## 2016-07-23 ENCOUNTER — Institutional Professional Consult (permissible substitution) (INDEPENDENT_AMBULATORY_CARE_PROVIDER_SITE_OTHER): Payer: Medicaid Other | Admitting: Cardiothoracic Surgery

## 2016-07-23 ENCOUNTER — Encounter: Payer: Self-pay | Admitting: Cardiothoracic Surgery

## 2016-07-23 VITALS — BP 118/69 | HR 65 | Resp 20 | Ht 66.0 in | Wt 99.0 lb

## 2016-07-23 DIAGNOSIS — R911 Solitary pulmonary nodule: Secondary | ICD-10-CM

## 2016-07-23 DIAGNOSIS — R918 Other nonspecific abnormal finding of lung field: Secondary | ICD-10-CM

## 2016-07-23 NOTE — Progress Notes (Addendum)
[address]       [place] [address]             [phone]                    Callan L Brooks [facility] Medical Record #[id number] Date of Birth: October 04, 1960  Referring: Curt Bears, MD Primary Care: [name], FNP  Chief Complaint:    Chief Complaint  Patient presents with  . Lung Lesion    Surgical eval, PET Scan 06/17/16, CT ABD/Pelvis 06/16/16  Suspicious stage IIIA (T1b, N2, M0) lung cancer, pending tissue diagnosis.   History of Present Illness:    Marcus Brooks 56 y.o. male is seen in the office  today for evaluation of abnormal CT of the chest and PET scan. The patient has a history of chronic pancreatitis and has had a jejunostomy tube and for feedings for several years. Because of persistent weight loss of more generalized evaluation was undertaken including a CT scan of the chest. A left upper lobe lung mass with mediastinal adenopathy suggested possible lung malignancy, this study was done in April. In June the patient had a PET scan that shows hypermetabolic activity in a mass in the left upper lobe and in the mediastinum suggestive of at least stage IIIa carcinoma the lung. He has no tissue diagnosis and is referred to thoracic surgery by oncology for tissue diagnosis.    Current Activity/ Functional Status:  Patient is independent with mobility/ambulation, transfers, ADL's, IADL's.   Zubrod Score: At the time of surgery this patient's most appropriate activity status/level should be described as: []     0    Normal activity, no symptoms []     1    Restricted in physical strenuous activity but ambulatory, able to do out light work [x]     2    Ambulatory and capable of self care, unable to do work activities, up and about               >50 % of waking hours                              []     3    Only limited self care, in bed greater than 50% of waking hours []     4    Completely disabled, no self care, confined to bed or chair []      5    Moribund   Past Medical History:  Diagnosis Date  . Anxiety states 08/04/2010  . Chronic airway obstruction, not elsewhere classified 08/04/2010  . Chronic pain syndrome 08/04/2010  . Chronic pancreatitis ([place]) 08/04/2010  . Coronary artery disease   . GERD (gastroesophageal reflux disease)   . Hiatal hernia    EGD 01/18/13  . Insomnia 08/04/2010  . Intellectual disability   . Lung nodule 05/20/2016  . Other abnormal glucose 08/04/2010  . Pancreatitis chronic 08/04/2010  . Protein calorie malnutrition ([facility]) 2010  . Seizure ([facility]) 08/04/2010  . Severe anemia 05/20/2016  . Splenic vein thrombosis    Chronic.  . Tobacco use disorder 08/04/2010  . Unspecified essential hypertension 08/04/2010    Past Surgical History:  Procedure Laterality Date  . COLONOSCOPY WITH PROPOFOL N/A 06/26/2016   Procedure: COLONOSCOPY WITH PROPOFOL;  Surgeon: Rogene Houston, MD;  [facility];  Service: Endoscopy;  Laterality: N/A;  10:30  . CORONARY  ANGIOPLASTY WITH STENT PLACEMENT    . ESOPHAGOGASTRODUODENOSCOPY N/A 01/18/2013   Procedure: ESOPHAGOGASTRODUODENOSCOPY (EGD);  Surgeon: Rogene Houston, MD;  [facility];  Service: Endoscopy;  Laterality: N/A;  . EUS  03/26/2009   [facility]  . GASTROSTOMY-JEJEUNOSTOMY TUBE CHANGE/PLACEMENT  12/11/2008   [facility]  . I&D EXTREMITY Left 09/12/2012   Procedure: IRRIGATION AND DEBRIDEMENT LEFT FIFTH FINGER;  Surgeon: Roseanne Kaufman, MD;  Location: [facility];  Service: Orthopedics;  Laterality: Left;  . JEJUNOSTOMY FEEDING TUBE  07/13/2010  . NERVE AND TENDON REPAIR Left 09/12/2012   Procedure: NERVE AND TENDON REPAIR LEFT FIFTH FINGER;  Surgeon: Roseanne Kaufman, MD;  Location: [facility];  Service: Orthopedics;  Laterality: Left;  . OPEN REDUCTION INTERNAL FIXATION (ORIF) METACARPAL Left 09/12/2012   Procedure: OPEN REDUCTION INTERNAL FIXATION LEFT FIFTH FINGER;  Surgeon: Roseanne Kaufman, MD;  Location: [facility];  Service: Orthopedics;  Laterality:  Left;  . PANCREATIC PSEUDOCYST DRAINAGE  07/13/2010  . PORTACATH PLACEMENT  11/28/2008   F[facility]ISHMAN    No family history on file.  Social History   Social History  . Marital status: Married    Spouse name: N/A  . Number of children: N/A  . Years of education: N/A   Occupational History  . Not on file.   Social History Main Topics  . Smoking status: Current Every Day Smoker    Packs/day: 1.00    Years: 20.00    Types: Cigarettes  . Smokeless tobacco: Never Used     Comment: 1 pack a day since age 17  . Alcohol use No     Comment: No etoh since 5-6 yrs  . Drug use: No  . Sexual activity: No   Other Topics Concern  . Not on file   Social History Narrative   Intellectual impairment, illiterate.     History  Smoking Status  . Current Every Day Smoker  . Packs/day: 1.00  . Years: 20.00  . Types: Cigarettes  Smokeless Tobacco  . Never Used    Comment: 1 pack a day since age 53    History  Alcohol Use No    Comment: No etoh since 5-6 yrs     Allergies  Allergen Reactions  . Latex Rash    Current Outpatient Prescriptions  Medication Sig Dispense Refill  . Adhesive Tape (CLOTH ADHESIVE SURG 1/2"X10YD) TAPE 1 each by Does not apply route 2 (two) times daily as needed. 1 each 2  . albuterol (PROVENTIL HFA;VENTOLIN HFA) 108 (90 BASE) MCG/ACT inhaler Inhale 2 puffs into the lungs every 6 (six) hours as needed for wheezing or shortness of breath.     . CREON 24000-76000 units CPEP TAKE TWO CAPSULES BY MOUTH BEFORE MEALS 168 each 3  . DULoxetine (CYMBALTA) 30 MG capsule Take 1 capsule (30 mg total) by mouth 2 (two) times daily. 60 capsule 2  . ferrous sulfate 325 (65 FE) MG tablet TAKE ONE (1) TABLET BY MOUTH EVERY DAY WITH BREAKFAST 30 tablet 3  . gabapentin (NEURONTIN) 300 MG capsule Take 1 capsule (300 mg total) by mouth 4 (four) times daily as needed. (Patient taking differently: Take 600 mg by mouth 3 (three) times daily. ) 180 capsule 3  . Gauze Pads &  Dressings (BIOGUARD GAUZE SPONGES) 4"X4" PADS 1 each by Does not apply route 2 (two) times daily. 1 each 5  . hydrocortisone cream 0.5 % Apply 1 application topically 2 (two) times daily. 30 g 0  . mirtazapine (REMERON) 15  MG tablet TAKE ONE TABLET BY MOUTH EVERY EVENING 30 tablet 3  . Nutritional Supplements (FEEDING SUPPLEMENT, VITAL AF 1.2 CAL,) LIQD Place 1,000 mLs into feeding tube daily. At 84ml/hr    . ondansetron (ZOFRAN) 4 MG tablet Take 1 tablet (4 mg total) by mouth every 6 (six) hours as needed for nausea. 20 tablet 0  . Oxycodone HCl 10 MG TABS Take 1 tablet (10 mg total) by mouth every 6 (six) hours as needed. 60 tablet 0  . pantoprazole (PROTONIX) 40 MG tablet TAKE ONE (1) TABLET BY MOUTH EVERY DAY 30 tablet 3  . phenytoin (DILANTIN) 100 MG ER capsule Take 2 capsules (200 mg total) by mouth at bedtime. 60 capsule 5  . promethazine (PHENERGAN) 25 MG tablet Take 25 mg by mouth every 6 (six) hours as needed for nausea or vomiting.     . ranitidine (ZANTAC) 150 MG capsule TAKE 1 CAPSULE BY MOUTH TWICE DAILY (Patient taking differently: TAKE 1 CAPSULE BY MOUTH AT BEDTIME) 60 capsule 3  . vitamin C (ASCORBIC ACID) 500 MG tablet Take 1 tablet (500 mg total) by mouth daily. 30 tablet 3  . Water For Irrigation, Sterile (FREE WATER) SOLN Place 200 mLs into feeding tube every 8 (eight) hours.     No current facility-administered medications for this visit.     Pertinent items are noted in HPI.   Review of Systems:     Cardiac Review of Systems: Y or N  Chest Pain [ n   ]  Resting SOB [  n ] Exertional SOB  Blue.Reese  ]  Orthopnea Florencio.Farrier  ]   Pedal Edema Florencio.Farrier   ]    Palpitations [ n ] Syncope  Florencio.Farrier  ]   Presyncope [ n  ]  General Review of Systems: [Y] = yes [  ]=no Constitional: recent weight change Blue.Reese  ];  Wt loss over the last 3 months [   ] anorexia [  ]; fatigue [  ]; nausea [  ]; night sweats [  ]; fever [  ]; or chills [  ];          Dental: poor dentition[  ]; Last Dentist visit:   Eye : blurred  vision [  ]; diplopia [   ]; vision changes [  ];  Amaurosis fugax[  ]; Resp: cough [ y ];  wheezing[ y ];  hemoptysis[ n ]; shortness of breath[  y]; paroxysmal nocturnal dyspnea[  ]; dyspnea on exertion[ y ]; or orthopnea[  ];  GI:  gallstones[  ], vomiting[  ];  dysphagia[  ]; melena[  ];  hematochezia [  ]; heartburn[  ];   Hx of  Colonoscopy[  ]; GU: kidney stones [  ]; hematuria[  ];   dysuria [  ];  nocturia[  ];  history of     obstruction [  ]; urinary frequency [  ]             Skin: rash, swelling[  ];, hair loss[  ];  peripheral edema[  ];  or itching[  ]; Musculosketetal: myalgias[  ];  joint swelling[  ];  joint erythema[  ];  joint pain[  ];  back pain[  ];  Heme/Lymph: bruising[  ];  bleeding[  ];  anemia[  ];  Neuro: TIA[ n ];  headaches[  ];  stroke[  ];  vertigo[  ];  seizures[ n ];   paresthesias[  ];  difficulty walking[n ];  Psych:depression[y ]; anxiety[ y ];  Endocrine: diabetes[ n ];  thyroid dysfunction[ n ];  Immunizations: Flu up to date [n  ]; Pneumococcal up to date [ n ];  Other:  Physical Exam: BP 118/69   Pulse 65   Resp 20   Ht 5\' 6"  (1.676 m)   Wt 99 lb (44.9 kg)   SpO2 98% Comment: RA  BMI 15.98 kg/m   PHYSICAL EXAMINATION: General appearance: alert, cooperative, appears older than stated age, cachectic and no distress Head: Normocephalic, without obvious abnormality, atraumatic Neck: no adenopathy, no carotid bruit, no JVD, supple, symmetrical, trachea midline and thyroid not enlarged, symmetric, no tenderness/mass/nodules Lymph nodes: Cervical, supraclavicular, and axillary nodes normal. Resp: clear to auscultation bilaterally Back: symmetric, no curvature. ROM normal. No CVA tenderness. Cardio: regular rate and rhythm, S1, S2 normal, no murmur, click, rub or gallop GI: Thin scaphoid abdomen with a left sided jejunostomy tube Extremities: extremities normal, atraumatic, no cyanosis or edema and Homans sign is negative, no sign of DVT Neurologic:  Grossly normal  Diagnostic Studies & Laboratory data:     Recent Radiology Findings:  Study Result   CLINICAL DATA:  Initial treatment strategy for solitary pulmonary nodule.  EXAM: NUCLEAR MEDICINE PET SKULL BASE TO THIGH  TECHNIQUE: 5.0 mCi F-18 FDG was injected intravenously. Full-ring PET imaging was performed from the skull base to thigh after the radiotracer. CT data was obtained and used for attenuation correction and anatomic localization.  FASTING BLOOD GLUCOSE:  Value: 100 mg/dl  COMPARISON:  CT chest dated 04/28/2016. CT abdomen/ pelvis dated 06/16/2016.  FINDINGS: NECK  No hypermetabolic lymph nodes in the neck.  CHEST  2.2 x 2.0 cm nodule in the medial left upper lobe adjacent to the aortic arch (series 8/ image 22), max SUV 10.1, compatible with primary bronchogenic neoplasm. This previously measured 1.8 x 1.6 cm when measured in a similar fashion.  2.1 cm AP window nodal metastasis (series 4/image 67), max SUV 8.9. This previously measured 1.8 cm when measured in a similar fashion.  Hypermetabolism in the left perihilar region, max SUV 5.6.  The heart is normal in size. No pericardial effusion. Coronary atherosclerosis of the LAD.  ABDOMEN/PELVIS  No abnormal hypermetabolic activity within the liver, pancreas, adrenal glands, or spleen.  No hypermetabolic lymph nodes in the abdomen or pelvis.  Additional findings in the abdomen/ pelvis are better evaluated on recent enhanced CT abdomen pelvis dated 06/16/2016. Chronic calcific pancreatitis. Splenic and hepatic granulomata. Gastrostomy tube. Atherosclerotic calcifications of the abdominal aorta and branch vessels.  SKELETON  No focal hypermetabolic activity to suggest skeletal metastasis.  IMPRESSION: Hypermetabolic 2.2 cm medial left upper lobe nodule, increased from prior CT, compatible with primary bronchogenic neoplasm.  Associated AP window nodal metastasis,  progressed from recent CT. Suspected left perihilar nodal metastasis.  No evidence of metastatic disease in the neck, abdomen, or pelvis.   Electronically Signed   By: Julian Hy M.D.   On: 06/18/2016 07:21   Study Result   CLINICAL DATA:  Smoker for 5 years. Chest pain. Abnormal chest x-ray.  EXAM: CT CHEST WITHOUT CONTRAST  TECHNIQUE: Multidetector CT imaging of the chest was performed following the standard protocol without IV contrast.  COMPARISON:  None.  FINDINGS: Cardiovascular: No significant vascular findings. Normal heart size. No pericardial effusion. Coronary artery atherosclerosis in the LAD.  Mediastinum/Nodes: Enlarge AP window lymph node measuring 2 cm in short axis. Ill-defined left hilar lymphadenopathy. Thyroid gland, trachea, and esophagus demonstrate no significant findings.  Lungs/Pleura: No pleural effusion or pneumothorax. Chronic left basilar pleural thickening. 1.4 x 1.7 cm nodule along the left lateral margin of the aortic arch. No other pulmonary nodule or soft tissue mass. Bilateral mild centrilobular emphysema. No pleural effusion or pneumothorax.  Upper Abdomen: Gastrostomy tube in satisfactory position. Calcifications in the pancreas as can be seen with chronic pancreatitis or prior granulomatous disease. Calcifications noted in the liver and spleen likely secondary to prior granulomatous disease.  Musculoskeletal: No acute osseous abnormality. No lytic or sclerotic osseous lesion.  IMPRESSION: 1. 1.4 x 1.7 cm nodule along the left lateral margin of the aortic large. The nodule is isodense with the aorta. Left hilar and AP window lymphadenopathy. Overall appearance is most concerning for primary lung malignancy. CT of the chest with intravenous contrast is recommended to completely exclude an aneurysm. These results will be called to the ordering clinician or representative by the Radiologist Assistant, and  communication documented in the PACS or zVision Dashboard.   Electronically Signed   By: Kathreen Devoid   On: 04/28/2016 15:33    I have independently reviewed the above radiologic studies.  Recent Lab Findings: Lab Results  Component Value Date   WBC 10.6 07/10/2016   HGB 10.9 (L) 07/10/2016   HCT 32.3 (L) 07/10/2016   PLT 312 07/10/2016   GLUCOSE 80 07/10/2016   CHOL 125 06/11/2015   TRIG 55 05/11/2016   HDL 31 (L) 06/11/2015   LDLCALC 76 06/11/2015   ALT 11 07/10/2016   AST 18 07/10/2016   NA 136 07/10/2016   K 4.5 07/10/2016   CL 107 07/10/2016   CREATININE 0.56 (L) 07/10/2016   BUN 9 07/10/2016   CO2 23 07/10/2016   TSH 3.349 10/04/2012   INR 1.12 05/10/2016   HGBA1C 4.8 09/27/2015      Assessment / Plan:      I discussed with patient the probable diagnosis of carcinoma the lung clinically stage IIIa, but to date with no tissue diagnosis. I reviewed the CT and PET scan with the patient and his family and have recommended that we proceed with bronchoscopy ebus and possible navigation bronchoscopy to the left upper lobe mass to obtain a tissue diagnosis. Risks and options of this procedure were discussed with the patient in detail and he is agreeable. Tentative plan for diagnostic procedure July 13, tomorrow. Patient has severe protein malnourishment  I  spent 40 minutes counseling the patient face to face and 50% or more the  time was spent in counseling and coordination of care. The total time spent in the appointment was 60 minutes.  Grace Isaac MD      [address] [place] [address] Office [phone]   Beeper [phone]  07/23/2016 2:04 PM

## 2016-07-23 NOTE — Progress Notes (Signed)
Pt denies any acute cardiopulmonary issues. Pt denies being under the care of a cardiologist. Pt denies having a cardiac cath and stress test. Pt made aware to stop taking vitamins, fish oil and herbal medications. Do not take any NSAIDs ie: Ibuprofen, Advil, Naproxen (Aleve), Motrin, BC and Goody Powder or any medication containing Aspirin. Pt verbalized understanding of all pre-op instructions.

## 2016-07-24 ENCOUNTER — Encounter (HOSPITAL_COMMUNITY)
Admission: RE | Disposition: A | Discharge status: Home / Self Care | Payer: Self-pay | Source: Ambulatory Visit | Attending: Cardiothoracic Surgery

## 2016-07-24 ENCOUNTER — Ambulatory Visit (HOSPITAL_COMMUNITY): Payer: Medicaid Other

## 2016-07-24 ENCOUNTER — Encounter (HOSPITAL_COMMUNITY): Payer: Self-pay | Admitting: *Deleted

## 2016-07-24 ENCOUNTER — Ambulatory Visit (HOSPITAL_COMMUNITY): Payer: Medicaid Other | Admitting: Certified Registered Nurse Anesthetist

## 2016-07-24 ENCOUNTER — Ambulatory Visit (HOSPITAL_COMMUNITY)
Admission: RE | Admit: 2016-07-24 | Discharge: 2016-07-24 | Disposition: A | Discharge status: Home / Self Care | Payer: Medicaid Other | Source: Ambulatory Visit | Attending: Cardiothoracic Surgery | Admitting: Cardiothoracic Surgery

## 2016-07-24 DIAGNOSIS — Z79899 Other long term (current) drug therapy: Secondary | ICD-10-CM | POA: Diagnosis not present

## 2016-07-24 DIAGNOSIS — R918 Other nonspecific abnormal finding of lung field: Secondary | ICD-10-CM | POA: Diagnosis present

## 2016-07-24 DIAGNOSIS — Z934 Other artificial openings of gastrointestinal tract status: Secondary | ICD-10-CM | POA: Diagnosis not present

## 2016-07-24 DIAGNOSIS — F79 Unspecified intellectual disabilities: Secondary | ICD-10-CM | POA: Insufficient documentation

## 2016-07-24 DIAGNOSIS — E43 Unspecified severe protein-calorie malnutrition: Secondary | ICD-10-CM | POA: Diagnosis not present

## 2016-07-24 DIAGNOSIS — J449 Chronic obstructive pulmonary disease, unspecified: Secondary | ICD-10-CM | POA: Insufficient documentation

## 2016-07-24 DIAGNOSIS — G4700 Insomnia, unspecified: Secondary | ICD-10-CM | POA: Insufficient documentation

## 2016-07-24 DIAGNOSIS — Z955 Presence of coronary angioplasty implant and graft: Secondary | ICD-10-CM | POA: Insufficient documentation

## 2016-07-24 DIAGNOSIS — C3412 Malignant neoplasm of upper lobe, left bronchus or lung: Secondary | ICD-10-CM | POA: Diagnosis not present

## 2016-07-24 DIAGNOSIS — F419 Anxiety disorder, unspecified: Secondary | ICD-10-CM | POA: Insufficient documentation

## 2016-07-24 DIAGNOSIS — I700 Atherosclerosis of aorta: Secondary | ICD-10-CM | POA: Insufficient documentation

## 2016-07-24 DIAGNOSIS — K219 Gastro-esophageal reflux disease without esophagitis: Secondary | ICD-10-CM | POA: Diagnosis not present

## 2016-07-24 DIAGNOSIS — R911 Solitary pulmonary nodule: Secondary | ICD-10-CM | POA: Diagnosis not present

## 2016-07-24 DIAGNOSIS — I10 Essential (primary) hypertension: Secondary | ICD-10-CM | POA: Diagnosis not present

## 2016-07-24 DIAGNOSIS — Z09 Encounter for follow-up examination after completed treatment for conditions other than malignant neoplasm: Secondary | ICD-10-CM

## 2016-07-24 DIAGNOSIS — K861 Other chronic pancreatitis: Secondary | ICD-10-CM | POA: Diagnosis not present

## 2016-07-24 DIAGNOSIS — I2510 Atherosclerotic heart disease of native coronary artery without angina pectoris: Secondary | ICD-10-CM | POA: Diagnosis not present

## 2016-07-24 DIAGNOSIS — Z86718 Personal history of other venous thrombosis and embolism: Secondary | ICD-10-CM | POA: Diagnosis not present

## 2016-07-24 DIAGNOSIS — F17210 Nicotine dependence, cigarettes, uncomplicated: Secondary | ICD-10-CM | POA: Diagnosis not present

## 2016-07-24 DIAGNOSIS — G894 Chronic pain syndrome: Secondary | ICD-10-CM | POA: Diagnosis not present

## 2016-07-24 HISTORY — PX: VIDEO BRONCHOSCOPY WITH ENDOBRONCHIAL ULTRASOUND: SHX6177

## 2016-07-24 LAB — COMPREHENSIVE METABOLIC PANEL
ALT: 13 U/L — ABNORMAL LOW (ref 17–63)
AST: 16 U/L (ref 15–41)
Albumin: 3.5 g/dL (ref 3.5–5.0)
Alkaline Phosphatase: 120 U/L (ref 38–126)
Anion gap: 9 (ref 5–15)
BUN: 5 mg/dL — ABNORMAL LOW (ref 6–20)
CO2: 22 mmol/L (ref 22–32)
Calcium: 8.9 mg/dL (ref 8.9–10.3)
Chloride: 110 mmol/L (ref 101–111)
Creatinine, Ser: 0.56 mg/dL — ABNORMAL LOW (ref 0.61–1.24)
GFR calc Af Amer: >60 mL/min (ref >60)
GFR calc non Af Amer: >60 mL/min (ref >60)
Glucose, Bld: 81 mg/dL (ref 65–99)
Potassium: 3.6 mmol/L (ref 3.5–5.1)
Sodium: 141 mmol/L (ref 135–145)
Total Bilirubin: 0.4 mg/dL (ref 0.3–1.2)
Total Protein: 7.4 g/dL (ref 6.5–8.1)

## 2016-07-24 LAB — CBC
HCT: 37.7 % — ABNORMAL LOW (ref 39.0–52.0)
Hemoglobin: 12.6 g/dL — ABNORMAL LOW (ref 13.0–17.0)
MCH: 31.3 pg (ref 26.0–34.0)
MCHC: 33.4 g/dL (ref 30.0–36.0)
MCV: 93.8 fL (ref 78.0–100.0)
Platelets: 183 10*3/uL (ref 150–400)
RBC: 4.02 MIL/uL — ABNORMAL LOW (ref 4.22–5.81)
RDW: 13.5 % (ref 11.5–15.5)
WBC: 9.3 10*3/uL (ref 4.0–10.5)

## 2016-07-24 LAB — APTT: aPTT: 34 seconds (ref 24–36)

## 2016-07-24 LAB — PROTIME-INR
INR: 1.03
Prothrombin Time: 13.6 seconds (ref 11.4–15.2)

## 2016-07-24 SURGERY — BRONCHOSCOPY, WITH EBUS
Anesthesia: General

## 2016-07-24 MED ORDER — FENTANYL CITRATE (PF) 250 MCG/5ML IJ SOLN
INTRAMUSCULAR | Status: AC
  Filled 2016-07-24: qty 5

## 2016-07-24 MED ORDER — MIDAZOLAM HCL 2 MG/2ML IJ SOLN
INTRAMUSCULAR | Status: AC
  Filled 2016-07-24: qty 2

## 2016-07-24 MED ORDER — ONDANSETRON HCL 4 MG/2ML IJ SOLN: 4.0000 mg | Freq: Once | INTRAMUSCULAR | Status: DC | PRN

## 2016-07-24 MED ORDER — ONDANSETRON HCL 4 MG/2ML IJ SOLN
INTRAMUSCULAR | Status: DC | PRN
  Administered 2016-07-24: 4 mg via INTRAVENOUS

## 2016-07-24 MED ORDER — SUGAMMADEX SODIUM 200 MG/2ML IV SOLN
INTRAVENOUS | Status: DC | PRN
Start: 2016-07-24 — End: 2016-07-24
  Administered 2016-07-24: 100 mg via INTRAVENOUS

## 2016-07-24 MED ORDER — FENTANYL CITRATE (PF) 100 MCG/2ML IJ SOLN
INTRAMUSCULAR | Status: DC | PRN
  Administered 2016-07-24: 75 ug via INTRAVENOUS

## 2016-07-24 MED ORDER — PHENYLEPHRINE HCL 10 MG/ML IJ SOLN
INTRAMUSCULAR | Status: DC | PRN
  Administered 2016-07-24: 120 ug via INTRAVENOUS

## 2016-07-24 MED ORDER — ROCURONIUM BROMIDE 100 MG/10ML IV SOLN
INTRAVENOUS | Status: DC | PRN
  Administered 2016-07-24: 20 mg via INTRAVENOUS
  Administered 2016-07-24: 50 mg via INTRAVENOUS

## 2016-07-24 MED ORDER — 0.9 % SODIUM CHLORIDE (POUR BTL) OPTIME
TOPICAL | Status: DC | PRN
  Administered 2016-07-24: 1000 mL

## 2016-07-24 MED ORDER — MIDAZOLAM HCL 2 MG/2ML IJ SOLN
INTRAMUSCULAR | Status: AC
Start: 2016-07-24 — End: ?
  Filled 2016-07-24: qty 2

## 2016-07-24 MED ORDER — LIDOCAINE HCL (PF) 1 % IJ SOLN
INTRAMUSCULAR | Status: AC
  Filled 2016-07-24: qty 30

## 2016-07-24 MED ORDER — EPINEPHRINE PF 1 MG/ML IJ SOLN
INTRAMUSCULAR | Status: AC
  Filled 2016-07-24: qty 1

## 2016-07-24 MED ORDER — DEXAMETHASONE SODIUM PHOSPHATE 10 MG/ML IJ SOLN
INTRAMUSCULAR | Status: DC | PRN
  Administered 2016-07-24: 5 mg via INTRAVENOUS

## 2016-07-24 MED ORDER — EPINEPHRINE PF 1 MG/ML IJ SOLN
INTRAMUSCULAR | Status: DC | PRN
  Administered 2016-07-24: 1 mg

## 2016-07-24 MED ORDER — LACTATED RINGERS IV SOLN
INTRAVENOUS | Status: DC
  Administered 2016-07-24: 08:00:00 via INTRAVENOUS

## 2016-07-24 MED ORDER — FENTANYL CITRATE (PF) 100 MCG/2ML IJ SOLN: 25.0000 ug | INTRAMUSCULAR | Status: DC | PRN

## 2016-07-24 MED ORDER — PROPOFOL 10 MG/ML IV BOLUS
INTRAVENOUS | Status: DC | PRN
  Administered 2016-07-24: 110 mg via INTRAVENOUS

## 2016-07-24 MED ORDER — LIDOCAINE HCL (CARDIAC) 20 MG/ML IV SOLN
INTRAVENOUS | Status: DC | PRN
  Administered 2016-07-24: 60 mg via INTRAVENOUS

## 2016-07-24 MED ORDER — PROPOFOL 10 MG/ML IV BOLUS
INTRAVENOUS | Status: AC
  Filled 2016-07-24: qty 20

## 2016-07-24 MED ORDER — MIDAZOLAM HCL 5 MG/5ML IJ SOLN
INTRAMUSCULAR | Status: DC | PRN
  Administered 2016-07-24: 2 mg via INTRAVENOUS

## 2016-07-24 MED ORDER — MEPERIDINE HCL 25 MG/ML IJ SOLN: 6.2500 mg | INTRAMUSCULAR | Status: DC | PRN

## 2016-07-24 MED ORDER — ROCURONIUM BROMIDE 50 MG/5ML IV SOLN
INTRAVENOUS | Status: AC
  Filled 2016-07-24: qty 1

## 2016-07-24 SURGICAL SUPPLY — 43 items
ADAPTER BRONCH F/PENTAX (ADAPTER) ×2 IMPLANT
ADPR BSCP EDG PNTX (ADAPTER) ×1
BRUSH BIOPSY BRONCH 10 SDTNB (MISCELLANEOUS) IMPLANT
BRUSH CYTOL CELLEBRITY 1.5X140 (MISCELLANEOUS) IMPLANT
BRUSH SUPERTRAX BIOPSY (INSTRUMENTS) ×1 IMPLANT
BRUSH SUPERTRAX NDL-TIP CYTO (INSTRUMENTS) IMPLANT
CANISTER SUCT 3000ML PPV (MISCELLANEOUS) ×3 IMPLANT
CHANNEL WORK EXTEND EDGE 180 (KITS) IMPLANT
CHANNEL WORK EXTEND EDGE 45 (KITS) IMPLANT
CHANNEL WORK EXTEND EDGE 90 (KITS) IMPLANT
CONT SPEC 4OZ CLIKSEAL STRL BL (MISCELLANEOUS) ×6 IMPLANT
COVER BACK TABLE 60X90IN (DRAPES) ×4 IMPLANT
COVER DOME SNAP 22 D (MISCELLANEOUS) ×1 IMPLANT
FILTER STRAW FLUID ASPIR (MISCELLANEOUS) IMPLANT
FORCEPS BIOP RJ4 1.8 (CUTTING FORCEPS) IMPLANT
FORCEPS BIOP SUPERTRX PREMAR (INSTRUMENTS) ×1 IMPLANT
GAUZE SPONGE 4X4 12PLY STRL (GAUZE/BANDAGES/DRESSINGS) ×4 IMPLANT
GLOVE BIO SURGEON STRL SZ 6.5 (GLOVE) ×3 IMPLANT
GLOVE SURG SS PI 6.5 STRL IVOR (GLOVE) ×1 IMPLANT
KIT CLEAN ENDO COMPLIANCE (KITS) ×6 IMPLANT
KIT PROCEDURE EDGE 180 (KITS) IMPLANT
KIT PROCEDURE EDGE 45 (KITS) IMPLANT
KIT PROCEDURE EDGE 90 (KITS) IMPLANT
KIT ROOM TURNOVER OR (KITS) ×4 IMPLANT
MARKER SKIN DUAL TIP RULER LAB (MISCELLANEOUS) ×3 IMPLANT
NDL BLUNT 18X1 FOR OR ONLY (NEEDLE) IMPLANT
NDL EBUS SONO TIP PENTAX (NEEDLE) ×1 IMPLANT
NDL SUPERTRX PREMARK BIOPSY (NEEDLE) IMPLANT
NEEDLE BLUNT 18X1 FOR OR ONLY (NEEDLE) IMPLANT
NEEDLE EBUS SONO TIP PENTAX (NEEDLE) ×2 IMPLANT
NEEDLE SUPERTRX PREMARK BIOPSY (NEEDLE) ×2 IMPLANT
NS IRRIG 1000ML POUR BTL (IV SOLUTION) ×3 IMPLANT
OIL SILICONE PENTAX (PARTS (SERVICE/REPAIRS)) ×3 IMPLANT
PAD ARMBOARD 7.5X6 YLW CONV (MISCELLANEOUS) ×8 IMPLANT
PATCHES PATIENT (LABEL) ×6 IMPLANT
SYR 20CC LL (SYRINGE) ×2 IMPLANT
SYR 20ML ECCENTRIC (SYRINGE) ×5 IMPLANT
SYR 3ML LL SCALE MARK (SYRINGE) ×2 IMPLANT
TOWEL OR 17X24 6PK STRL BLUE (TOWEL DISPOSABLE) ×2 IMPLANT
TRAP SPECIMEN MUCOUS 40CC (MISCELLANEOUS) ×4 IMPLANT
TUBE CONNECTING 20X1/4 (TUBING) ×4 IMPLANT
UNDERPAD 30X30 (UNDERPADS AND DIAPERS) ×2 IMPLANT
WATER STERILE IRR 1000ML POUR (IV SOLUTION) ×4 IMPLANT

## 2016-07-24 NOTE — Anesthesia Procedure Notes (Signed)
Procedure Name: Intubation Date/Time: 07/24/2016 11:25 AM Performed by: Myna Bright Pre-anesthesia Checklist: Patient identified, Emergency Drugs available, Suction available and Patient being monitored Patient Re-evaluated:Patient Re-evaluated prior to induction Oxygen Delivery Method: Circle System Utilized Preoxygenation: Pre-oxygenation with 100% oxygen Induction Type: IV induction Ventilation: Mask ventilation without difficulty Laryngoscope Size: Mac and 4 Grade View: Grade I Tube type: Oral Tube size: 8.5 mm Number of attempts: 1 Airway Equipment and Method: Stylet Placement Confirmation: ETT inserted through vocal cords under direct vision,  positive ETCO2 and breath sounds checked- equal and bilateral Secured at: 22 cm Tube secured with: Tape Dental Injury: Teeth and Oropharynx as per pre-operative assessment

## 2016-07-24 NOTE — Anesthesia Postprocedure Evaluation (Signed)
Anesthesia Post Note  Patient: Safal L Batrez  Procedure(s) Performed: Procedure(s) (LRB): VIDEO BRONCHOSCOPY WITH ENDOBRONCHIAL ULTRASOUND (N/A)     Patient location during evaluation: PACU Anesthesia Type: General Level of consciousness: awake and alert Pain management: pain level controlled Vital Signs Assessment: post-procedure vital signs reviewed and stable Respiratory status: spontaneous breathing, nonlabored ventilation, respiratory function stable and patient connected to nasal cannula oxygen Cardiovascular status: blood pressure returned to baseline and stable Postop Assessment: no signs of nausea or vomiting Anesthetic complications: no    Last Vitals:  Vitals:   07/24/16 0723 07/24/16 1251  BP: (!) 113/52 135/63  Pulse: (!) 53   Resp: 20   Temp: 36.6 C 36.4 C    Last Pain:  Vitals:   07/24/16 0723  TempSrc: Oral  PainSc:                  Giara Mcgaughey

## 2016-07-24 NOTE — Discharge Instructions (Signed)
General Anesthesia, Adult, Care After These instructions provide you with information about caring for yourself after your procedure. Your health care provider may also give you more specific instructions. Your treatment has been planned according to current medical practices, but problems sometimes occur. Call your health care provider if you have any problems or questions after your procedure. What can I expect after the procedure? After the procedure, it is common to have:  Vomiting.  A sore throat.  Mental slowness.  It is common to feel:  Nauseous.  Cold or shivery.  Sleepy.  Tired.  Sore or achy, even in parts of your body where you did not have surgery.  Follow these instructions at home: For at least 24 hours after the procedure:  Do not: ? Participate in activities where you could fall or become injured. ? Drive. ? Use heavy machinery. ? Drink alcohol. ? Take sleeping pills or medicines that cause drowsiness. ? Make important decisions or sign legal documents. ? Take care of children on your own.  Rest. Eating and drinking  If you vomit, drink water, juice, or soup when you can drink without vomiting.  Drink enough fluid to keep your urine clear or pale yellow.  Make sure you have little or no nausea before eating solid foods.  Follow the diet recommended by your health care provider. General instructions  Have a responsible adult stay with you until you are awake and alert.  Return to your normal activities as told by your health care provider. Ask your health care provider what activities are safe for you.  Take over-the-counter and prescription medicines only as told by your health care provider.  If you smoke, do not smoke without supervision.  Keep all follow-up visits as told by your health care provider. This is important. Contact a health care provider if:  You continue to have nausea or vomiting at home, and medicines are not  helpful.  You cannot drink fluids or start eating again.  You cannot urinate after 8-12 hours.  You develop a skin rash.  You have fever.  You have increasing redness at the site of your procedure. Get help right away if:  You have difficulty breathing.  You have chest pain.  You have unexpected bleeding.  You feel that you are having a life-threatening or urgent problem. This information is not intended to replace advice given to you by your health care provider. Make sure you discuss any questions you have with your health care provider. Document Released: 04/06/2000 Document Revised: 06/03/2015 Document Reviewed: 12/13/2014 Elsevier Interactive Patient Education  2018 [employer]. Flexible Bronchoscopy, Care After This sheet gives you information about how to care for yourself after your procedure. Your health care provider may also give you more specific instructions. If you have problems or questions, contact your health care provider. What can I expect after the procedure? After the procedure, it is common to have the following symptoms for 24-48 hours:  A cough that is worse than it was before the procedure.  A low-grade fever.  A sore throat or hoarse voice.  Small streaks of blood in the mucus from your lungs (sputum), if tissue samples were removed (biopsy).  Follow these instructions at home: Eating and drinking  Do not eat or drink anything (including water) for 2 hours after your procedure, or until your numbing medicine (local anesthetic) has worn off. Having a numb throat increases your risk of burning yourself or choking.  After your numbness is gone  and your cough and gag reflexes have returned, you may start eating only soft foods and slowly drinking liquids.  The day after the procedure, return to your normal diet. Driving  Do not drive for 24 hours if you were given a medicine to help you relax (sedative).  Do not drive or use heavy machinery  while taking prescription pain medicine. General instructions  Take over-the-counter and prescription medicines only as told by your health care provider.  Return to your normal activities as told by your health care provider. Ask your health care provider what activities are safe for you.  Do not use any products that contain nicotine or tobacco, such as cigarettes and e-cigarettes. If you need help quitting, ask your health care provider.  Keep all follow-up visits as told by your health care provider. This is important, especially if you had a biopsy taken. Get help right away if:  You have shortness of breath that gets worse.  You become light-headed or feel like you might faint.  You have chest pain.  You cough up more than a small amount of blood.  The amount of blood you cough up increases. Summary  Common symptoms in the 24-48 hours following a flexible bronchoscopy include cough, low-grade fever, sore throat or hoarse voice, and blood-streaked mucus from the lungs (if you had a biopsy).  Do not eat or drink anything (including water) for 2 hours after your procedure, or until your local anesthetic has worn off. You can return to your normal diet the day after the procedure.  Get help right away if you develop worsening shortness of breath, have chest pain, become light-headed, or cough up more than a small amount of blood. This information is not intended to replace advice given to you by your health care provider. Make sure you discuss any questions you have with your health care provider. Document Released: 07/18/2004 Document Revised: 01/17/2016 Document Reviewed: 01/17/2016 Elsevier Interactive Patient Education  2017 [employer].

## 2016-07-24 NOTE — OR Nursing (Signed)
Upon NT (Amy) taking pt to car for discharge home, pt's fiance was in the driver seat but changed places with pt and pt drove away from hospital. Pt and fiance had been instructed that pt was not to drive for the next 24 hours. Both voiced understanding of this and fiance signed discharge instructions.

## 2016-07-24 NOTE — H&P (Signed)
[address]       [place] [address]             [phone]                    Marcus Brooks Medical Record #[id number] Date of Birth: 10-Sep-1960  Referring: Dr Julien Nordmann  Primary Care: [name], FNP  Chief Complaint:     Suspicious stage IIIA (T1b, N2, M0) lung cancer, pending tissue diagnosis.   History of Present Illness:    Marcus Brooks 56 y.o. male was  seen in the office for evaluation of abnormal CT of the chest and PET scan. The patient has a history of chronic pancreatitis and has had a jejunostomy tube and for feedings for several years. Because of persistent weight loss of more generalized evaluation was undertaken including a CT scan of the chest. A left upper lobe lung mass with mediastinal adenopathy suggested possible lung malignancy, this study was done in April. In June the patient had a PET scan that shows hypermetabolic activity in a mass in the left upper lobe and in the mediastinum suggestive of at least stage IIIa carcinoma the lung. He has no tissue diagnosis and is referred to thoracic surgery by oncology for tissue diagnosis.    Current Activity/ Functional Status:  Patient is independent with mobility/ambulation, transfers, ADL's, IADL's.   Zubrod Score: At the time of surgery this patient's most appropriate activity status/level should be described as: []     0    Normal activity, no symptoms []     1    Restricted in physical strenuous activity but ambulatory, able to do out light work [x]     2    Ambulatory and capable of self care, unable to do work activities, up and about               >50 % of waking hours                              []     3    Only limited self care, in bed greater than 50% of waking hours []     4    Completely disabled, no self care, confined to bed or chair []     5    Moribund   Past Medical History:  Diagnosis Date  . Anxiety states 08/04/2010  . Chronic airway obstruction, not  elsewhere classified 08/04/2010  . Chronic pain syndrome 08/04/2010  . Chronic pancreatitis ([place]) 08/04/2010  . Coronary artery disease   . GERD (gastroesophageal reflux disease)   . Hiatal hernia    EGD 01/18/13  . Insomnia 08/04/2010  . Intellectual disability   . Lung nodule 05/20/2016  . Other abnormal glucose 08/04/2010  . Pancreatitis chronic 08/04/2010  . Protein calorie malnutrition ([place]) 2010  . Seizure ([place]) 08/04/2010  . Severe anemia 05/20/2016  . Splenic vein thrombosis    Chronic.  . Tobacco use disorder 08/04/2010  . Unspecified essential hypertension 08/04/2010    Past Surgical History:  Procedure Laterality Date  . COLONOSCOPY WITH PROPOFOL N/A 06/26/2016   Procedure: COLONOSCOPY WITH PROPOFOL;  Surgeon: Rogene Houston, MD;  [facility];  Service: Endoscopy;  Laterality: N/A;  10:30  . CORONARY ANGIOPLASTY WITH STENT PLACEMENT    . ESOPHAGOGASTRODUODENOSCOPY N/A 01/18/2013   Procedure: ESOPHAGOGASTRODUODENOSCOPY (EGD);  Surgeon: Rogene Houston, MD;  [facility];  Service: Endoscopy;  Laterality: N/A;  . EUS  03/26/2009   [facility]  . GASTROSTOMY-JEJEUNOSTOMY TUBE CHANGE/PLACEMENT  12/11/2008   [facility]  . I&D EXTREMITY Left 09/12/2012   Procedure: IRRIGATION AND DEBRIDEMENT LEFT FIFTH FINGER;  Surgeon: Roseanne Kaufman, MD;  Location: [facility];  Service: Orthopedics;  Laterality: Left;  . JEJUNOSTOMY FEEDING TUBE  07/13/2010  . NERVE AND TENDON REPAIR Left 09/12/2012   Procedure: NERVE AND TENDON REPAIR LEFT FIFTH FINGER;  Surgeon: Roseanne Kaufman, MD;  Location: [facility];  Service: Orthopedics;  Laterality: Left;  . OPEN REDUCTION INTERNAL FIXATION (ORIF) METACARPAL Left 09/12/2012   Procedure: OPEN REDUCTION INTERNAL FIXATION LEFT FIFTH FINGER;  Surgeon: Roseanne Kaufman, MD;  Location: [facility];  Service: Orthopedics;  Laterality: Left;  . PANCREATIC PSEUDOCYST DRAINAGE  07/13/2010  . PORTACATH PLACEMENT  11/28/2008   [facility]    History reviewed.  No pertinent family history.  Social History   Social History  . Marital status: Married    Spouse name: N/A  . Number of children: N/A  . Years of education: N/A   Occupational History  . Not on file.   Social History Main Topics  . Smoking status: Current Every Day Smoker    Packs/day: 1.00    Years: 20.00    Types: Cigarettes  . Smokeless tobacco: Never Used     Comment: 1 pack a day since age 38  . Alcohol use No     Comment: No etoh since 5-6 yrs  . Drug use: No  . Sexual activity: No   Other Topics Concern  . Not on file   Social History Narrative   Intellectual impairment, illiterate.     History  Smoking Status  . Current Every Day Smoker  . Packs/day: 1.00  . Years: 20.00  . Types: Cigarettes  Smokeless Tobacco  . Never Used    Comment: 1 pack a day since age 54    History  Alcohol Use No    Comment: No etoh since 5-6 yrs     Allergies  Allergen Reactions  . Latex Rash    Current Facility-Administered Medications  Medication Dose Route Frequency Provider Last Rate Last Dose  . lactated ringers infusion   Intravenous Continuous Albertha Ghee, MD 10 mL/hr at 07/24/16 0730      Pertinent items are noted in HPI.   Review of Systems:     Cardiac Review of Systems: Y or N  Chest Pain [ n   ]  Resting SOB [  n ] Exertional SOB  Blue.Reese  ]  Orthopnea Florencio.Farrier  ]   Pedal Edema Florencio.Farrier   ]    Palpitations [ n ] Syncope  Florencio.Farrier  ]   Presyncope [ n  ]  General Review of Systems: [Y] = yes [  ]=no Constitional: recent weight change Blue.Reese  ];  Wt loss over the last 3 months [   ] anorexia [  ]; fatigue [  ]; nausea [  ]; night sweats [  ]; fever [  ]; or chills [  ];          Dental: poor dentition[  ]; Last Dentist visit:   Eye : blurred vision [  ]; diplopia [   ]; vision changes [  ];  Amaurosis fugax[  ]; Resp: cough [ y ];  wheezing[ y ];  hemoptysis[ n ]; shortness of breath[  y]; paroxysmal nocturnal dyspnea[  ]; dyspnea on  exertion[ y ]; or orthopnea[  ];  GI:   gallstones[  ], vomiting[  ];  dysphagia[  ]; melena[  ];  hematochezia [  ]; heartburn[  ];   Hx of  Colonoscopy[  ]; GU: kidney stones [  ]; hematuria[  ];   dysuria [  ];  nocturia[  ];  history of     obstruction [  ]; urinary frequency [  ]             Skin: rash, swelling[  ];, hair loss[  ];  peripheral edema[  ];  or itching[  ]; Musculosketetal: myalgias[  ];  joint swelling[  ];  joint erythema[  ];  joint pain[  ];  back pain[  ];  Heme/Lymph: bruising[  ];  bleeding[  ];  anemia[  ];  Neuro: TIA[ n ];  headaches[  ];  stroke[  ];  vertigo[  ];  seizures[ n ];   paresthesias[  ];  difficulty walking[n ];  Psych:depression[y ]; anxiety[ y ];  Endocrine: diabetes[ n ];  thyroid dysfunction[ n ];  Immunizations: Flu up to date Florencio.Farrier  ]; Pneumococcal up to date [ n ];  Other:  Physical Exam: BP (!) 113/52   Pulse (!) 53   Temp 97.8 F (36.6 C) (Oral)   Resp 20   Ht 5\' 6"  (1.676 m)   Wt 99 lb (44.9 kg)   SpO2 100%   BMI 15.98 kg/m   PHYSICAL EXAMINATION: General appearance: alert, cooperative, appears older than stated age, cachectic and no distress Head: Normocephalic, without obvious abnormality, atraumatic Neck: no adenopathy, no carotid bruit, no JVD, supple, symmetrical, trachea midline and thyroid not enlarged, symmetric, no tenderness/mass/nodules Lymph nodes: Cervical, supraclavicular, and axillary nodes normal. Resp: clear to auscultation bilaterally Back: symmetric, no curvature. ROM normal. No CVA tenderness. Cardio: regular rate and rhythm, S1, S2 normal, no murmur, click, rub or gallop GI: Thin scaphoid abdomen with a left sided jejunostomy tube Extremities: extremities normal, atraumatic, no cyanosis or edema and Homans sign is negative, no sign of DVT Neurologic: Grossly normal  Diagnostic Studies & Laboratory data:     Recent Radiology Findings:  Study Result  Ct Super D Chest Wo Contrast  Result Date: 07/24/2016 CLINICAL DATA:  56 year old male with  history of left lung mass and shortness of breath. Evaluate for potential malignancy. EXAM: CT CHEST WITHOUT CONTRAST TECHNIQUE: Multidetector CT imaging of the chest was performed using thin slice collimation for electromagnetic bronchoscopy planning purposes, without intravenous contrast. COMPARISON:  Chest CT 04/28/2016.  PET-CT 06/17/2016. FINDINGS: Cardiovascular: Heart size is normal. There is no significant pericardial fluid, thickening or pericardial calcification. There is aortic atherosclerosis, as well as atherosclerosis of the great vessels of the mediastinum and the coronary arteries, including calcified atherosclerotic plaque in the left main, left anterior descending and right coronary arteries. Mediastinum/Nodes: Enlarged AP window lymph node measuring 2.1 cm in short axis, similar to the prior study. Soft tissue prominence in the anterior aspect of the left hilum superiorly, suspicious for underlying lymphadenopathy (poorly evaluated on today's noncontrast CT examination). Small hiatal hernia. No axillary lymphadenopathy. Lungs/Pleura: Previously noted left upper lobe pulmonary nodule is stable in size measuring 2.2 x 2.0 cm (axial image 44 of series 5). This again makes broad contact with the pleura along its medial margin. Several other scattered pulmonary nodules are noted throughout both lungs, measuring 4 mm or less in size, stable in size, number and distribution compared to prior examinations,  favored to be benign. Mild diffuse bronchial wall thickening with mild centrilobular and paraseptal emphysema. No acute consolidative airspace disease. No pleural effusions. Upper Abdomen: Percutaneous gastrostomy tube with retention balloon in the gastric lumen. Numerous calcified granulomas noted throughout the liver and spleen. Aortic atherosclerosis. Extensive parenchymal calcifications throughout the pancreas, presumably sequela of chronic pancreatitis. Musculoskeletal: There are no aggressive  appearing lytic or blastic lesions noted in the visualized portions of the skeleton. IMPRESSION: 1. Left upper lobe pulmonary nodule measuring 2.2 x 2.0 cm with 2.1 cm short axis AP window lymph node and prominent soft tissue in the left hilar region) suspicious for additional lymphadenopathy), stable compared to prior PET-CT 06/17/2016. 2. Other tiny pulmonary nodules scattered throughout both lungs measuring 4 mm or less in size are also stable compared to the prior examination and nonspecific but favored to be benign. 3. Aortic atherosclerosis, in addition to left main and 2 vessel coronary artery disease. Please note that although the presence of coronary artery calcium documents the presence of coronary artery disease, the severity of this disease and any potential stenosis cannot be assessed on this non-gated CT examination. Assessment for potential risk factor modification, dietary therapy or pharmacologic therapy may be warranted, if clinically indicated. 4. Diffuse bronchial wall thickening with mild centrilobular and paraseptal emphysema; imaging findings suggestive of underlying COPD. 5. Additional incidental findings, as above. Aortic Atherosclerosis (ICD10-I70.0) and Emphysema (ICD10-J43.9). Electronically Signed   By: Vinnie Langton M.D.   On: 07/24/2016 08:53   Ct done last night, no report   CLINICAL DATA:  Initial treatment strategy for solitary pulmonary nodule.  EXAM: NUCLEAR MEDICINE PET SKULL BASE TO THIGH  TECHNIQUE: 5.0 mCi F-18 FDG was injected intravenously. Full-ring PET imaging was performed from the skull base to thigh after the radiotracer. CT data was obtained and used for attenuation correction and anatomic localization.  FASTING BLOOD GLUCOSE:  Value: 100 mg/dl  COMPARISON:  CT chest dated 04/28/2016. CT abdomen/ pelvis dated 06/16/2016.  FINDINGS: NECK  No hypermetabolic lymph nodes in the neck.  CHEST  2.2 x 2.0 cm nodule in the medial left  upper lobe adjacent to the aortic arch (series 8/ image 22), max SUV 10.1, compatible with primary bronchogenic neoplasm. This previously measured 1.8 x 1.6 cm when measured in a similar fashion.  2.1 cm AP window nodal metastasis (series 4/image 67), max SUV 8.9. This previously measured 1.8 cm when measured in a similar fashion.  Hypermetabolism in the left perihilar region, max SUV 5.6.  The heart is normal in size. No pericardial effusion. Coronary atherosclerosis of the LAD.  ABDOMEN/PELVIS  No abnormal hypermetabolic activity within the liver, pancreas, adrenal glands, or spleen.  No hypermetabolic lymph nodes in the abdomen or pelvis.  Additional findings in the abdomen/ pelvis are better evaluated on recent enhanced CT abdomen pelvis dated 06/16/2016. Chronic calcific pancreatitis. Splenic and hepatic granulomata. Gastrostomy tube. Atherosclerotic calcifications of the abdominal aorta and branch vessels.  SKELETON  No focal hypermetabolic activity to suggest skeletal metastasis.  IMPRESSION: Hypermetabolic 2.2 cm medial left upper lobe nodule, increased from prior CT, compatible with primary bronchogenic neoplasm.  Associated AP window nodal metastasis, progressed from recent CT. Suspected left perihilar nodal metastasis.  No evidence of metastatic disease in the neck, abdomen, or pelvis.   Electronically Signed   By: Julian Hy M.D.   On: 06/18/2016 07:21   Study Result   CLINICAL DATA:  Smoker for 5 years. Chest pain. Abnormal chest x-ray.  EXAM: CT CHEST  WITHOUT CONTRAST  TECHNIQUE: Multidetector CT imaging of the chest was performed following the standard protocol without IV contrast.  COMPARISON:  None.  FINDINGS: Cardiovascular: No significant vascular findings. Normal heart size. No pericardial effusion. Coronary artery atherosclerosis in the LAD.  Mediastinum/Nodes: Enlarge AP window lymph node measuring 2 cm  in short axis. Ill-defined left hilar lymphadenopathy. Thyroid gland, trachea, and esophagus demonstrate no significant findings.  Lungs/Pleura: No pleural effusion or pneumothorax. Chronic left basilar pleural thickening. 1.4 x 1.7 cm nodule along the left lateral margin of the aortic arch. No other pulmonary nodule or soft tissue mass. Bilateral mild centrilobular emphysema. No pleural effusion or pneumothorax.  Upper Abdomen: Gastrostomy tube in satisfactory position. Calcifications in the pancreas as can be seen with chronic pancreatitis or prior granulomatous disease. Calcifications noted in the liver and spleen likely secondary to prior granulomatous disease.  Musculoskeletal: No acute osseous abnormality. No lytic or sclerotic osseous lesion.  IMPRESSION: 1. 1.4 x 1.7 cm nodule along the left lateral margin of the aortic large. The nodule is isodense with the aorta. Left hilar and AP window lymphadenopathy. Overall appearance is most concerning for primary lung malignancy. CT of the chest with intravenous contrast is recommended to completely exclude an aneurysm. These results will be called to the ordering clinician or representative by the Radiologist Assistant, and communication documented in the PACS or zVision Dashboard.   Electronically Signed   By: Kathreen Devoid   On: 04/28/2016 15:33    I have independently reviewed the above radiologic studies.  Recent Lab Findings: Lab Results  Component Value Date   WBC 9.3 07/24/2016   HGB 12.6 (L) 07/24/2016   HCT 37.7 (L) 07/24/2016   PLT 183 07/24/2016   GLUCOSE 81 07/24/2016   CHOL 125 06/11/2015   TRIG 55 05/11/2016   HDL 31 (L) 06/11/2015   LDLCALC 76 06/11/2015   ALT 13 (L) 07/24/2016   AST 16 07/24/2016   NA 141 07/24/2016   K 3.6 07/24/2016   CL 110 07/24/2016   CREATININE 0.56 (L) 07/24/2016   BUN 5 (L) 07/24/2016   CO2 22 07/24/2016   TSH 3.349 10/04/2012   INR 1.03 07/24/2016   HGBA1C 4.8  09/27/2015      Assessment / Plan:      I discussed with patient the probable diagnosis of carcinoma the lung clinically stage IIIa, but to date with no tissue diagnosis. I reviewed the CT and PET scan with the patient and his family and have recommended that we proceed with bronchoscopy ebus and possible navigation bronchoscopy to the left upper lobe mass to obtain a tissue diagnosis. Risks and options of this procedure were discussed with the patient in detail and he is agreeable. Tentative plan for diagnostic procedure July 13, tomorrow. Patient has severe protein malnourishment  The goals risks and alternatives of the planned surgical procedure Procedure(s): VIDEO BRONCHOSCOPY WITH ENDOBRONCHIAL NAVIGATION (N/A) VIDEO BRONCHOSCOPY WITH ENDOBRONCHIAL ULTRASOUND (N/A)  have been discussed with the patient in detail. The risks of the procedure including death, infection, stroke, myocardial infarction, bleeding, pneumothorax ,  blood transfusion have all been discussed specifically.  I have quoted Marcus Brooks a 1% of perioperative mortality and a complication rate as high as 20%. The patient's questions have been answered.Marcus Brooks is willing  to proceed with the planned procedure.  Grace Isaac MD      [address] [place] [address] Office [phone]   Beeper [phone]  07/24/2016 11:03 AM

## 2016-07-24 NOTE — Transfer of Care (Signed)
Immediate Anesthesia Transfer of Care Note  Patient: Marcus Brooks  Procedure(s) Performed: Procedure(s): VIDEO BRONCHOSCOPY WITH ENDOBRONCHIAL ULTRASOUND (N/A)  Patient Location: PACU  Anesthesia Type:General  Level of Consciousness: awake, alert  and patient cooperative  Airway & Oxygen Therapy: Patient Spontanous Breathing  Post-op Assessment: Report given to RN and Post -op Vital signs reviewed and stable  Post vital signs: Reviewed and stable  Last Vitals:  Vitals:   07/24/16 0723 07/24/16 1251  BP: (!) 113/52 135/63  Pulse: (!) 53   Resp: 20   Temp: 36.6 C 36.4 C    Last Pain:  Vitals:   07/24/16 0723  TempSrc: Oral  PainSc:          Complications: No apparent anesthesia complications

## 2016-07-24 NOTE — Brief Op Note (Signed)
      [address]       [place] [address]             [phone]      07/24/2016  12:52 PM  PATIENT:  Marcus Brooks  56 y.o. male  PRE-OPERATIVE DIAGNOSIS:  LEFT LUNG MASS  POST-OPERATIVE DIAGNOSIS:  LEFT LUNG MASS -  non small cell lung cancer , positive left upper lobe and ap window node   PROCEDURE:  Procedure(s): VIDEO BRONCHOSCOPY WITH ENDOBRONCHIAL NAVIGATION (N/A)   SURGEON:  Surgeon(s) and Role:    * Grace Isaac, MD - Primary    ASSISTANTS: none   ANESTHESIA:   general  EBL:  Total I/O In: 1000 [I.V.:1000] Out: 10 [Blood:10]  BLOOD ADMINISTERED:none  DRAINS: none   LOCAL MEDICATIONS USED:  NONE  SPECIMEN:  Source of Specimen:  left upper lobe and ap window node   DISPOSITION OF SPECIMEN:  PATHOLOGY  COUNTS:  YES  DICTATION: .Dragon Dictation  PLAN OF CARE: Discharge to home after PACU  PATIENT DISPOSITION:  PACU - hemodynamically stable.   Delay start of Pharmacological VTE agent (>24hrs) due to surgical blood loss or risk of bleeding: yes

## 2016-07-24 NOTE — Anesthesia Preprocedure Evaluation (Addendum)
Anesthesia Evaluation  Patient identified by MRN, date of birth, ID band Patient awake    Reviewed: Allergy & Precautions, H&P , NPO status , Patient's Chart, lab work & pertinent test results  History of Anesthesia Complications Negative for: history of anesthetic complications  Airway Mallampati: II   Neck ROM: full    Dental   Pulmonary COPD,  COPD inhaler, Current Smoker,    Pulmonary exam normal breath sounds clear to auscultation       Cardiovascular Exercise Tolerance: Poor hypertension, Pt. on medications Normal cardiovascular exam Rhythm:Regular Rate:Normal     Neuro/Psych Seizures -, Well Controlled,  PSYCHIATRIC DISORDERS Anxiety    GI/Hepatic GERD  Medicated and Poorly Controlled,Chronic pancreatitis  gtube    Endo/Other    Renal/GU      Musculoskeletal   Abdominal   Peds  Hematology  (+) Blood dyscrasia, anemia ,   Anesthesia Other Findings Chronic pain and daily benzodiazepines   ekg 5/18  Sinus rhythm RSR' in V1 or V2, probably normal variant  Echo 5/17 - Left ventricle: The cavity size was normal. Systolic function was   normal. The estimated ejection fraction was in the range of 60%   to 65%. Wall motion was normal; there were no regional wall   motion abnormalities. Left ventricular diastolic function   parameters were normal. - Left atrium: The atrium was normal in size. - Right ventricle: Systolic function was normal.  Reproductive/Obstetrics                          Anesthesia Physical  Anesthesia Plan  ASA: III  Anesthesia Plan: General   Post-op Pain Management:    Induction: Intravenous  PONV Risk Score and Plan: 2 and Ondansetron, Dexamethasone and Treatment may vary due to age or medical condition  Airway Management Planned: Oral ETT  Additional Equipment:   Intra-op Plan:   Post-operative Plan: Extubation in OR  Informed Consent: I have  reviewed the patients History and Physical, chart, labs and discussed the procedure including the risks, benefits and alternatives for the proposed anesthesia with the patient or authorized representative who has indicated his/her understanding and acceptance.   Dental advisory given  Plan Discussed with:   Anesthesia Plan Comments:        Anesthesia Quick Evaluation

## 2016-07-25 ENCOUNTER — Encounter (HOSPITAL_COMMUNITY): Payer: Self-pay | Admitting: Cardiothoracic Surgery

## 2016-07-25 NOTE — Op Note (Signed)
NAMEKWAN, SHELLHAMMER NO.:  [id number]  MEDICAL RECORD NO.:  [id number]  LOCATION:  [facility]                         FACILITY:  [facility]  PHYSICIAN:  Lanelle Bal, MD    DATE OF BIRTH:  08/11/1960  DATE OF PROCEDURE:  07/24/2016 DATE OF DISCHARGE:                              OPERATIVE REPORT   PREOPERATIVE DIAGNOSIS:  Left upper lobe lung mass and enlarged mediastinal lymph nodes suggestive of clinical stage IIIA carcinoma of the lung.  POSTOPERATIVE DIAGNOSIS:  Non-small cell cancer on pulmonary smear.  PROCEDURE PERFORMED:  Bronchoscopy with biopsy and EBUS with biopsy.  SURGEON:  Lanelle Bal, MD.  BRIEF HISTORY:  The patient is a 56 year old male with significant medical problems including chronic pancreatitis, who presented to the office the day prior to biopsy with a CT scan done in April showing a left upper lobe lung mass and mediastinal adenopathy highly suggestive of advanced stage malignancy.  The patient was referred by Dr. Julien Nordmann for biopsy and obtaining a tissue diagnosis.  The risks and options were discussed with the patient who agreed to proceed and signed informed consent.  DESCRIPTION OF PROCEDURE:  The patient underwent general endotracheal anesthesia without incident.  A single-lumen endotracheal tube was placed.  Appropriate time-out was performed and then we proceeded with the bronchoscopy with 2.8 mm video bronchoscope.  The right tracheobronchial tree was free of any disease.  The left lower lobe was free of disease.  The left upper lobe in the anterior segment, there was an area partially narrowing the bronchus that was suspicious for malignancy.  We then removed the visual scope, placed an EBUS scope and easily identified lodged AP window node.  Multiple biopsies of this were obtained.  The initial quick smears confirmed malignancy.  Additional tissue from this area was taken and submitted in a cell block.  We then replaced the  visual scope and went to the anterior segment of the left upper lobe and obtained both brushes, needle aspirations, and biopsies of the left upper lobe mass.  Pulmonary smear on the brushings confirmed malignancy this side also.  The remaining tissue was preserved for permanent sections and marker testing as appropriate.  The tracheobronchial tree was suctioned clear.  Blood loss was very minimal.  The patient tolerated the procedure without obvious complication, was extubated in the operating room, and transferred to the recovery room for further postoperative care.     Lanelle Bal, MD     EG/MEDQ  D:  07/25/2016  T:  07/25/2016  Job:  [id number]  cc:   Eilleen Kempf, M.D.

## 2016-07-27 ENCOUNTER — Encounter: Payer: Self-pay | Admitting: *Deleted

## 2016-07-27 ENCOUNTER — Telehealth: Payer: Self-pay | Admitting: *Deleted

## 2016-07-27 ENCOUNTER — Other Ambulatory Visit: Payer: Self-pay | Admitting: Family Medicine

## 2016-07-27 DIAGNOSIS — G40909 Epilepsy, unspecified, not intractable, without status epilepticus: Secondary | ICD-10-CM

## 2016-07-27 DIAGNOSIS — F329 Major depressive disorder, single episode, unspecified: Secondary | ICD-10-CM

## 2016-07-27 DIAGNOSIS — R911 Solitary pulmonary nodule: Secondary | ICD-10-CM

## 2016-07-27 DIAGNOSIS — F32A Depression, unspecified: Secondary | ICD-10-CM

## 2016-07-27 DIAGNOSIS — G629 Polyneuropathy, unspecified: Secondary | ICD-10-CM

## 2016-07-27 NOTE — Telephone Encounter (Signed)
Oncology Nurse Navigator Documentation  Oncology Nurse Navigator Flowsheets 07/27/2016  Navigator Location [facility]  Navigator Encounter Type Telephone  Telephone Outgoing Call/I received an updated from Dr. Servando Snare and Dr. Julien Nordmann.  I called today to schedule Mr. Spieker to be seen this week.  I was unable to reach and left vm message to call with my name and phone number.  Treatment Phase Pre-Tx/Tx Discussion  Barriers/Navigation Needs Coordination of Care  Interventions Coordination of Care  Coordination of Care Appts  Acuity Level 1  Time Spent with Patient 15

## 2016-07-27 NOTE — Progress Notes (Signed)
Oncology Nurse Navigator Documentation  Oncology Nurse Navigator Flowsheets 07/27/2016  Navigator Location [facility]  Navigator Encounter Type Telephone/I called Marcus Brooks again today.  I gave him an appt to see Dr. Julien Nordmann and Rad Onc at [facility] this week.  He verbalized understanding of appt time and place.   Telephone Outgoing Call  Treatment Phase Pre-Tx/Tx Discussion  Barriers/Navigation Needs Coordination of Care  Interventions Coordination of Care  Coordination of Care Appts  Acuity Level 1  Time Spent with Patient 15

## 2016-07-28 ENCOUNTER — Other Ambulatory Visit: Payer: Self-pay | Admitting: Family Medicine

## 2016-07-28 DIAGNOSIS — F32A Depression, unspecified: Secondary | ICD-10-CM

## 2016-07-28 DIAGNOSIS — G40909 Epilepsy, unspecified, not intractable, without status epilepticus: Secondary | ICD-10-CM

## 2016-07-28 DIAGNOSIS — G629 Polyneuropathy, unspecified: Secondary | ICD-10-CM

## 2016-07-28 DIAGNOSIS — F329 Major depressive disorder, single episode, unspecified: Secondary | ICD-10-CM

## 2016-07-28 MED ORDER — DULOXETINE HCL 30 MG PO CPEP: 30.0000 mg | ORAL_CAPSULE | Freq: Two times a day (BID) | ORAL | 5 refills | Status: AC

## 2016-07-28 MED ORDER — PHENYTOIN SODIUM EXTENDED 100 MG PO CAPS: 200.0000 mg | ORAL_CAPSULE | Freq: Every day | ORAL | 5 refills | Status: DC

## 2016-07-29 ENCOUNTER — Inpatient Hospital Stay: Admission: RE | Admit: 2016-07-29 | Payer: Medicaid Other | Source: Ambulatory Visit | Admitting: Radiation Oncology

## 2016-07-30 ENCOUNTER — Encounter: Payer: Self-pay | Admitting: Internal Medicine

## 2016-07-30 ENCOUNTER — Encounter: Payer: Self-pay | Admitting: *Deleted

## 2016-07-30 ENCOUNTER — Ambulatory Visit
Admission: RE | Admit: 2016-07-30 | Discharge: 2016-07-30 | Disposition: A | Discharge status: Home / Self Care | Payer: Medicaid Other | Source: Ambulatory Visit | Attending: Radiation Oncology | Admitting: Radiation Oncology

## 2016-07-30 ENCOUNTER — Ambulatory Visit (HOSPITAL_BASED_OUTPATIENT_CLINIC_OR_DEPARTMENT_OTHER): Payer: Medicaid Other | Admitting: Internal Medicine

## 2016-07-30 ENCOUNTER — Other Ambulatory Visit (HOSPITAL_BASED_OUTPATIENT_CLINIC_OR_DEPARTMENT_OTHER): Payer: Medicaid Other

## 2016-07-30 ENCOUNTER — Other Ambulatory Visit: Payer: Self-pay | Admitting: *Deleted

## 2016-07-30 ENCOUNTER — Telehealth: Payer: Self-pay | Admitting: Internal Medicine

## 2016-07-30 ENCOUNTER — Ambulatory Visit: Payer: Medicaid Other | Attending: Internal Medicine | Admitting: Physical Therapy

## 2016-07-30 VITALS — BP 132/75 | HR 75 | Temp 98.6°F | Resp 17 | Ht 66.0 in | Wt 100.0 lb

## 2016-07-30 DIAGNOSIS — G894 Chronic pain syndrome: Secondary | ICD-10-CM

## 2016-07-30 DIAGNOSIS — C3412 Malignant neoplasm of upper lobe, left bronchus or lung: Secondary | ICD-10-CM

## 2016-07-30 DIAGNOSIS — Z5111 Encounter for antineoplastic chemotherapy: Secondary | ICD-10-CM

## 2016-07-30 DIAGNOSIS — M6281 Muscle weakness (generalized): Secondary | ICD-10-CM | POA: Diagnosis present

## 2016-07-30 DIAGNOSIS — R911 Solitary pulmonary nodule: Secondary | ICD-10-CM

## 2016-07-30 DIAGNOSIS — E46 Unspecified protein-calorie malnutrition: Secondary | ICD-10-CM

## 2016-07-30 DIAGNOSIS — J449 Chronic obstructive pulmonary disease, unspecified: Secondary | ICD-10-CM | POA: Diagnosis not present

## 2016-07-30 DIAGNOSIS — Z7189 Other specified counseling: Secondary | ICD-10-CM

## 2016-07-30 DIAGNOSIS — Z510 Encounter for antineoplastic radiation therapy: Secondary | ICD-10-CM | POA: Insufficient documentation

## 2016-07-30 DIAGNOSIS — E43 Unspecified severe protein-calorie malnutrition: Secondary | ICD-10-CM

## 2016-07-30 DIAGNOSIS — R293 Abnormal posture: Secondary | ICD-10-CM | POA: Diagnosis present

## 2016-07-30 HISTORY — DX: Malignant neoplasm of upper lobe, left bronchus or lung: C34.12

## 2016-07-30 LAB — COMPREHENSIVE METABOLIC PANEL
ALT: 10 U/L (ref 0–55)
AST: 14 U/L (ref 5–34)
Albumin: 3.4 g/dL — ABNORMAL LOW (ref 3.5–5.0)
Alkaline Phosphatase: 139 U/L (ref 40–150)
Anion Gap: 8 mEq/L (ref 3–11)
BUN: 7.2 mg/dL (ref 7.0–26.0)
CHLORIDE: 107 meq/L (ref 98–109)
CO2: 23 meq/L (ref 22–29)
Calcium: 9.1 mg/dL (ref 8.4–10.4)
Creatinine: 0.7 mg/dL (ref 0.7–1.3)
GLUCOSE: 97 mg/dL (ref 70–140)
POTASSIUM: 3.9 meq/L (ref 3.5–5.1)
Sodium: 139 mEq/L (ref 136–145)
TOTAL PROTEIN: 7.2 g/dL (ref 6.4–8.3)
Total Bilirubin: <0.22 mg/dL (ref 0.20–1.20)

## 2016-07-30 LAB — CBC WITH DIFFERENTIAL/PLATELET
BASO%: 1.4 % (ref 0.0–2.0)
Basophils Absolute: 0.2 10*3/uL — ABNORMAL HIGH (ref 0.0–0.1)
EOS ABS: 0.4 10*3/uL (ref 0.0–0.5)
EOS%: 4.1 % (ref 0.0–7.0)
HEMATOCRIT: 35.5 % — AB (ref 38.4–49.9)
HGB: 12.1 g/dL — ABNORMAL LOW (ref 13.0–17.1)
LYMPH#: 3.4 10*3/uL — AB (ref 0.9–3.3)
LYMPH%: 30.9 % (ref 14.0–49.0)
MCH: 32.1 pg (ref 27.2–33.4)
MCHC: 34.1 g/dL (ref 32.0–36.0)
MCV: 93.9 fL (ref 79.3–98.0)
MONO#: 0.8 10*3/uL (ref 0.1–0.9)
MONO%: 7.4 % (ref 0.0–14.0)
NEUT%: 56.2 % (ref 39.0–75.0)
NEUTROS ABS: 6.1 10*3/uL (ref 1.5–6.5)
PLATELETS: 233 10*3/uL (ref 140–400)
RBC: 3.78 10*6/uL — ABNORMAL LOW (ref 4.20–5.82)
RDW: 14 % (ref 11.0–14.6)
WBC: 10.9 10*3/uL — ABNORMAL HIGH (ref 4.0–10.3)

## 2016-07-30 MED ORDER — PROCHLORPERAZINE MALEATE 10 MG PO TABS: 10.0000 mg | ORAL_TABLET | Freq: Four times a day (QID) | ORAL | 0 refills | Status: DC | PRN

## 2016-07-30 NOTE — Progress Notes (Signed)
Oncology Nurse Navigator Documentation  Oncology Nurse Navigator Flowsheets 07/30/2016  Navigator Location [facility]  Navigator Encounter Type Clinic/MDC/I spoke with patient and his fiance today at thoracic clinic.  I gave and explained information on lung cancer, treatments, resources and support at [facility] and next steps.   Abnormal Finding Date 04/28/2016  Confirmed Diagnosis Date 07/24/2016  Multidisiplinary Clinic Date 07/30/2016  Patient Visit Type MedOnc  Treatment Phase Pre-Tx/Tx Discussion  Barriers/Navigation Needs Education  Education Understanding Cancer/ Treatment Options;Newly Diagnosed Cancer Education;Other  Interventions Education  Education Method Verbal;Written  Acuity Level 2  Acuity Level 2 Educational needs  Time Spent with Patient 30

## 2016-07-30 NOTE — Progress Notes (Signed)
[place] [phone]   Fax:([phone] Multidisciplinary thoracic oncology clinic  OFFICE PROGRESS NOTE  [name], FNP 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  PRIOR THERAPY: None  CURRENT THERAPY: Systemic chemotherapy with cisplatin 60 MG/M2 on day 1 and etoposide 120 MG/M2 on days 1, 2 and 3 every 3 weeks. First dose 08/11/2016.  INTERVAL HISTORY: Marcus Brooks 56 y.o. male returns to the clinic today for follow-up visit accompanied by family member. The patient is feeling much better today and he gained several pounds since his last visit. He denied having any current chest pain but continues to have shortness of breath at baseline and increased with exertion with mild cough with no hemoptysis. He denied having any nausea, vomiting, diarrhea or constipation. He has no fever or chills. Unfortunately he continues to smoke. He was referred to Dr. Servando Snare for consideration of tissue biopsy and he underwent bronchoscopy with endobronchial ultrasound and biopsy of the left upper lobe lung mass and enlarged mediastinal lymph nodes. The final pathology was consistent with a small cell carcinoma. The patient is here today for evaluation and discussion of his treatment options.   MEDICAL HISTORY: Past Medical History:  Diagnosis Date  . Anxiety states 08/04/2010  . Chronic airway obstruction, not elsewhere classified 08/04/2010  . Chronic pain syndrome 08/04/2010  . Chronic pancreatitis ([place]) 08/04/2010  . Coronary artery disease   . GERD (gastroesophageal reflux disease)   . Hiatal hernia    EGD 01/18/13  . Insomnia 08/04/2010  . Intellectual disability   . Lung nodule 05/20/2016  . Other abnormal glucose 08/04/2010  . Pancreatitis chronic 08/04/2010  . Protein calorie malnutrition ([place]) 2010  . Seizure ([place]) 08/04/2010  . Severe anemia 05/20/2016  .  Splenic vein thrombosis    Chronic.  . Tobacco use disorder 08/04/2010  . Unspecified essential hypertension 08/04/2010    ALLERGIES:  is allergic to latex.  MEDICATIONS:  Current Outpatient Prescriptions  Medication Sig Dispense Refill  . albuterol (PROVENTIL HFA;VENTOLIN HFA) 108 (90 BASE) MCG/ACT inhaler Inhale 2 puffs into the lungs every 6 (six) hours as needed for wheezing or shortness of breath.     . CREON 24000-76000 units CPEP TAKE TWO CAPSULES BY MOUTH BEFORE MEALS (Patient taking differently: TAKE 60109 UNITS BY MOUTH BEFORE MEALS) 168 each 3  . DULoxetine (CYMBALTA) 30 MG capsule Take 1 capsule (30 mg total) by mouth 2 (two) times daily. 60 capsule 5  . ferrous sulfate 325 (65 FE) MG tablet TAKE ONE (1) TABLET BY MOUTH EVERY DAY WITH BREAKFAST (Patient taking differently: Take 325 mg by mouth daily with breakfast. ) 30 tablet 3  . gabapentin (NEURONTIN) 300 MG capsule Take 1 capsule (300 mg total) by mouth 4 (four) times daily as needed. (Patient taking differently: Take 600 mg by mouth 3 (three) times daily. ) 180 capsule 3  . hydrocortisone cream 0.5 % Apply 1 application topically 2 (two) times daily. 30 g 0  . mirtazapine (REMERON) 15 MG tablet TAKE ONE TABLET BY MOUTH EVERY EVENING (Patient taking differently: TAKE 15 mg BY MOUTH EVERY EVENING) 30 tablet 3  . Nutritional Supplements (FEEDING SUPPLEMENT, VITAL AF 1.2 CAL,) LIQD Place 1,000 mLs into feeding tube daily. At 61ml/hr    . ondansetron (ZOFRAN) 4 MG tablet Take 1 tablet (4 mg total) by mouth every 6 (six) hours as  needed for nausea. 20 tablet 0  . pantoprazole (PROTONIX) 40 MG tablet TAKE ONE (1) TABLET BY MOUTH EVERY DAY (Patient taking differently: TAKE 40 MG BY MOUTH EVERY DAY) 30 tablet 3  . phenytoin (DILANTIN) 100 MG ER capsule Take 2 capsules (200 mg total) by mouth at bedtime. 60 capsule 5  . promethazine (PHENERGAN) 25 MG tablet Take 25 mg by mouth every 6 (six) hours as needed for nausea or vomiting.     .  ranitidine (ZANTAC) 150 MG capsule TAKE 1 CAPSULE BY MOUTH TWICE DAILY (Patient taking differently: Take 150 mg by mouth twice daily) 60 capsule 3  . vitamin C (ASCORBIC ACID) 500 MG tablet Take 1 tablet (500 mg total) by mouth daily. 30 tablet 3  . Water For Irrigation, Sterile (FREE WATER) SOLN Place 200 mLs into feeding tube every 8 (eight) hours.    . Adhesive Tape (CLOTH ADHESIVE SURG 1/2"X10YD) TAPE 1 each by Does not apply route 2 (two) times daily as needed. (Patient not taking: Reported on 07/24/2016) 1 each 2  . Gauze Pads & Dressings (BIOGUARD GAUZE SPONGES) 4"X4" PADS 1 each by Does not apply route 2 (two) times daily. (Patient not taking: Reported on 07/24/2016) 1 each 5  . Oxycodone HCl 10 MG TABS Take 1 tablet (10 mg total) by mouth every 6 (six) hours as needed. (Patient not taking: Reported on 07/30/2016) 60 tablet 0   No current facility-administered medications for this visit.     SURGICAL HISTORY:  Past Surgical History:  Procedure Laterality Date  . COLONOSCOPY WITH PROPOFOL N/A 06/26/2016   Procedure: COLONOSCOPY WITH PROPOFOL;  Surgeon: Rogene Houston, MD;  [facility];  Service: Endoscopy;  Laterality: N/A;  10:30  . CORONARY ANGIOPLASTY WITH STENT PLACEMENT    . ESOPHAGOGASTRODUODENOSCOPY N/A 01/18/2013   Procedure: ESOPHAGOGASTRODUODENOSCOPY (EGD);  Surgeon: Rogene Houston, MD;  [facility];  Service: Endoscopy;  Laterality: N/A;  . EUS  03/26/2009   [facility]  . GASTROSTOMY-JEJEUNOSTOMY TUBE CHANGE/PLACEMENT  12/11/2008   [facility]  . I&D EXTREMITY Left 09/12/2012   Procedure: IRRIGATION AND DEBRIDEMENT LEFT FIFTH FINGER;  Surgeon: Roseanne Kaufman, MD;  Location: [facility];  Service: Orthopedics;  Laterality: Left;  . JEJUNOSTOMY FEEDING TUBE  07/13/2010  . NERVE AND TENDON REPAIR Left 09/12/2012   Procedure: NERVE AND TENDON REPAIR LEFT FIFTH FINGER;  Surgeon: Roseanne Kaufman, MD;  Location: [facility];  Service: Orthopedics;  Laterality: Left;  .  OPEN REDUCTION INTERNAL FIXATION (ORIF) METACARPAL Left 09/12/2012   Procedure: OPEN REDUCTION INTERNAL FIXATION LEFT FIFTH FINGER;  Surgeon: Roseanne Kaufman, MD;  Location: [facility];  Service: Orthopedics;  Laterality: Left;  . PANCREATIC PSEUDOCYST DRAINAGE  07/13/2010  . PORTACATH PLACEMENT  11/28/2008   [facility]  . VIDEO BRONCHOSCOPY WITH ENDOBRONCHIAL ULTRASOUND N/A 07/24/2016   Procedure: VIDEO BRONCHOSCOPY WITH ENDOBRONCHIAL ULTRASOUND;  Surgeon: Grace Isaac, MD;  Location: [facility];  Service: Thoracic;  Laterality: N/A;    REVIEW OF SYSTEMS:  Constitutional: positive for fatigue Eyes: negative Ears, nose, mouth, throat, and face: negative Respiratory: positive for cough and dyspnea on exertion Cardiovascular: negative Gastrointestinal: negative Genitourinary:negative Integument/breast: negative Hematologic/lymphatic: negative Musculoskeletal:negative Neurological: negative Behavioral/Psych: negative Endocrine: negative Allergic/Immunologic: negative   PHYSICAL EXAMINATION: General appearance: alert, cooperative, fatigued and no distress Head: Normocephalic, without obvious abnormality, atraumatic Neck: no adenopathy, no JVD, supple, symmetrical, trachea midline and thyroid not enlarged, symmetric, no tenderness/mass/nodules Lymph nodes: Cervical, supraclavicular, and axillary nodes normal. Resp: clear to auscultation  bilaterally Back: symmetric, no curvature. ROM normal. No CVA tenderness. Cardio: regular rate and rhythm, S1, S2 normal, no murmur, click, rub or gallop GI: soft, non-tender; bowel sounds normal; no masses,  no organomegaly Extremities: extremities normal, atraumatic, no cyanosis or edema Neurologic: Alert and oriented X 3, normal strength and tone. Normal symmetric reflexes. Normal coordination and gait  ECOG PERFORMANCE STATUS: 1 - Symptomatic but completely ambulatory  Blood pressure 132/75, pulse 75, temperature 98.6 F (37 C), temperature source Oral,  resp. rate 17, height 5\' 6"  (1.676 m), weight 100 lb (45.4 kg), SpO2 98 %.  LABORATORY DATA: Lab Results  Component Value Date   WBC 10.9 (H) 07/30/2016   HGB 12.1 (L) 07/30/2016   HCT 35.5 (L) 07/30/2016   MCV 93.9 07/30/2016   PLT 233 07/30/2016      Chemistry      Component Value Date/Time   NA 139 07/30/2016 1435   K 3.9 07/30/2016 1435   CL 110 07/24/2016 0717   CO2 23 07/30/2016 1435   BUN 7.2 07/30/2016 1435   CREATININE 0.7 07/30/2016 1435      Component Value Date/Time   CALCIUM 9.1 07/30/2016 1435   ALKPHOS 139 07/30/2016 1435   AST 14 07/30/2016 1435   ALT 10 07/30/2016 1435   BILITOT <0.22 07/30/2016 1435       RADIOGRAPHIC STUDIES: Dg Chest Port 1 View  Result Date: 07/24/2016 CLINICAL DATA:  Left lung mass. Non-small cell lung cancer. Status post bronchoscopy EXAM: PORTABLE CHEST 1 VIEW COMPARISON:  07/23/2016 CT FINDINGS: Known left upper lobe mass and neighboring mediastinal adenopathy. No postprocedural collapse, edema, or pneumothorax. Normal heart size. Epigastric will embolization clips. IMPRESSION: 1. No acute finding after bronchoscopy. 2. Left upper lobe mass and adenopathy. Electronically Signed   By: Monte Fantasia M.D.   On: 07/24/2016 14:03   Ct Super D Chest Wo Contrast  Result Date: 07/24/2016 CLINICAL DATA:  56 year old male with history of left lung mass and shortness of breath. Evaluate for potential malignancy. EXAM: CT CHEST WITHOUT CONTRAST TECHNIQUE: Multidetector CT imaging of the chest was performed using thin slice collimation for electromagnetic bronchoscopy planning purposes, without intravenous contrast. COMPARISON:  Chest CT 04/28/2016.  PET-CT 06/17/2016. FINDINGS: Cardiovascular: Heart size is normal. There is no significant pericardial fluid, thickening or pericardial calcification. There is aortic atherosclerosis, as well as atherosclerosis of the great vessels of the mediastinum and the coronary arteries, including calcified  atherosclerotic plaque in the left main, left anterior descending and right coronary arteries. Mediastinum/Nodes: Enlarged AP window lymph node measuring 2.1 cm in short axis, similar to the prior study. Soft tissue prominence in the anterior aspect of the left hilum superiorly, suspicious for underlying lymphadenopathy (poorly evaluated on today's noncontrast CT examination). Small hiatal hernia. No axillary lymphadenopathy. Lungs/Pleura: Previously noted left upper lobe pulmonary nodule is stable in size measuring 2.2 x 2.0 cm (axial image 44 of series 5). This again makes broad contact with the pleura along its medial margin. Several other scattered pulmonary nodules are noted throughout both lungs, measuring 4 mm or less in size, stable in size, number and distribution compared to prior examinations, favored to be benign. Mild diffuse bronchial wall thickening with mild centrilobular and paraseptal emphysema. No acute consolidative airspace disease. No pleural effusions. Upper Abdomen: Percutaneous gastrostomy tube with retention balloon in the gastric lumen. Numerous calcified granulomas noted throughout the liver and spleen. Aortic atherosclerosis. Extensive parenchymal calcifications throughout the pancreas, presumably sequela of chronic pancreatitis. Musculoskeletal: There are  no aggressive appearing lytic or blastic lesions noted in the visualized portions of the skeleton. IMPRESSION: 1. Left upper lobe pulmonary nodule measuring 2.2 x 2.0 cm with 2.1 cm short axis AP window lymph node and prominent soft tissue in the left hilar region) suspicious for additional lymphadenopathy), stable compared to prior PET-CT 06/17/2016. 2. Other tiny pulmonary nodules scattered throughout both lungs measuring 4 mm or less in size are also stable compared to the prior examination and nonspecific but favored to be benign. 3. Aortic atherosclerosis, in addition to left main and 2 vessel coronary artery disease. Please note  that although the presence of coronary artery calcium documents the presence of coronary artery disease, the severity of this disease and any potential stenosis cannot be assessed on this non-gated CT examination. Assessment for potential risk factor modification, dietary therapy or pharmacologic therapy may be warranted, if clinically indicated. 4. Diffuse bronchial wall thickening with mild centrilobular and paraseptal emphysema; imaging findings suggestive of underlying COPD. 5. Additional incidental findings, as above. Aortic Atherosclerosis (ICD10-I70.0) and Emphysema (ICD10-J43.9). Electronically Signed   By: Vinnie Langton M.D.   On: 07/24/2016 08:53    ASSESSMENT AND PLAN:  This is a very pleasant 56 years old white male with recently diagnosed limited stage small cell lung cancer (T1b, N2, M0) presented with left upper lobe lung nodule in addition to mediastinal lymphadenopathy diagnosed in July 2018. I had a lengthy discussion with the patient and his family member about his current disease stage, prognosis and treatment options. I recommended for the patient to complete the staging workup by ordering a MRI of the brain to rule out brain metastasis. I discussed with the patient his treatment options and goals of care. I recommended for him treatment with systemic chemotherapy with cisplatin 60 MG/M2 on day 1 and etoposide at 120 MG/M2 on days 1, 2 and 3 every 3 weeks. This will be concurrent with radiotherapy. I discussed with the patient adverse effect of the chemotherapy including but not limited to alopecia, myelosuppression, nausea and vomiting, peripheral neuropathy, liver or renal dysfunction. I arranged for the patient to see Dr. Sondra Come later today for evaluation and discussion of the radiotherapy option. The patient is expected to start the first cycle of this treatment on 08/11/2016. For COPD, he will continue his treatment with the inhaler. For pain management the patient is  currently on oxycodone. For malnutrition he is able to eat more by mouth and he also continues supplemental nutrition via the PEG tube. I will see the patient back for follow-up visit in 2 weeks for evaluation and management of any adverse effect of his treatment. He was advised to call immediately if he has any concerning symptoms in the interval. The patient was seen during the multiple seborrheic thoracic oncology clinic by medical oncology, radiation oncology, thoracic navigator, social worker and physical therapist. The patient voices understanding of current disease status and treatment options and is in agreement with the current care plan. All questions were answered. The patient knows to call the clinic with any problems, questions or concerns. We can certainly see the patient much sooner if necessary. I spent 30 minutes counseling the patient face to face. The total time spent in the appointment was 40 minutes.  Disclaimer: This note was dictated with voice recognition software. Similar sounding words can inadvertently be transcribed and may not be corrected upon review.

## 2016-07-30 NOTE — Patient Instructions (Signed)
Steps to Quit Smoking Smoking tobacco can be bad for your health. It can also affect almost every organ in your body. Smoking puts you and people around you at risk for many serious long-lasting (chronic) diseases. Quitting smoking is hard, but it is one of the best things that you can do for your health. It is never too late to quit. What are the benefits of quitting smoking? When you quit smoking, you lower your risk for getting serious diseases and conditions. They can include:  Lung cancer or lung disease.  Heart disease.  Stroke.  Heart attack.  Not being able to have children (infertility).  Weak bones (osteoporosis) and broken bones (fractures).  If you have coughing, wheezing, and shortness of breath, those symptoms may get better when you quit. You may also get sick less often. If you are pregnant, quitting smoking can help to lower your chances of having a baby of low birth weight. What can I do to help me quit smoking? Talk with your doctor about what can help you quit smoking. Some things you can do (strategies) include:  Quitting smoking totally, instead of slowly cutting back how much you smoke over a period of time.  Going to in-person counseling. You are more likely to quit if you go to many counseling sessions.  Using resources and support systems, such as: ? Online chats with a counselor. ? Phone quitlines. ? Printed self-help materials. ? Support groups or group counseling. ? Text messaging programs. ? Mobile phone apps or applications.  Taking medicines. Some of these medicines may have nicotine in them. If you are pregnant or breastfeeding, do not take any medicines to quit smoking unless your doctor says it is okay. Talk with your doctor about counseling or other things that can help you.  Talk with your doctor about using more than one strategy at the same time, such as taking medicines while you are also going to in-person counseling. This can help make  quitting easier. What things can I do to make it easier to quit? Quitting smoking might feel very hard at first, but there is a lot that you can do to make it easier. Take these steps:  Talk to your family and friends. Ask them to support and encourage you.  Call phone quitlines, reach out to support groups, or work with a counselor.  Ask people who smoke to not smoke around you.  Avoid places that make you want (trigger) to smoke, such as: ? Bars. ? Parties. ? Smoke-break areas at work.  Spend time with people who do not smoke.  Lower the stress in your life. Stress can make you want to smoke. Try these things to help your stress: ? Getting regular exercise. ? Deep-breathing exercises. ? Yoga. ? Meditating. ? Doing a body scan. To do this, close your eyes, focus on one area of your body at a time from head to toe, and notice which parts of your body are tense. Try to relax the muscles in those areas.  Download or buy apps on your mobile phone or tablet that can help you stick to your quit plan. There are many free apps, such as QuitGuide from the CDC (Centers for Disease Control and Prevention). You can find more support from smokefree.gov and other websites.  This information is not intended to replace advice given to you by your health care provider. Make sure you discuss any questions you have with your health care provider. Document Released: 10/25/2008 Document   Revised: 08/27/2015 Document Reviewed: 05/15/2014 Elsevier Interactive Patient Education  2018 [employer]vier Inc.  

## 2016-07-30 NOTE — Progress Notes (Signed)
START ON PATHWAY REGIMEN - Small Cell Lung     A cycle is every 21 days:     Etoposide      Cisplatin   **Always confirm dose/schedule in your pharmacy ordering system**    Patient Characteristics: Limited Stage, First Line Stage Grouping: Limited AJCC T Category: T1b AJCC N Category: N2 AJCC M Category: M0 AJCC 8 Stage Grouping: IIIA Line of therapy: First Line Would you be surprised if this patient died  in the next year? I would be surprised if this patient died in the next year Intent of Therapy: Curative Intent, Discussed with Patient

## 2016-07-30 NOTE — Progress Notes (Signed)
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    ICD-10-CM   1. 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 male who was recently diagnosed with small cell carcinoma of the left upper lobe of lung.  The patient underwent biopsy of the left upper lobe of lung and fine needle aspiration on 07/24/16, which revealed malignant cells consistent with small cell carcinoma, with rare atypical cells present.   CT chest without contrast, also on 07/24/16, revealed left upper lobe pulmonary nodule measuring 2.2 x 2.0 cm with 2.1 cm short axis AP window lymph node and prominent soft tissue in the left hilar region) suspicious for additional lymphadenopathy), stable compared to prior PET-CT 06/17/16. Other tiny pulmonary nodules scattered throughout both lungs measuring 4 mm or less in size are also stable compared to the prior examination and nonspecific but favored to be benign.   The patient was referred today for presentation in the multidisciplinary conference.  Radiology studies and pathology slides were presented there for review and discussion of treatment options.  A consensus was discussed regarding potential next steps.  On review of systems, the patient denies headaches or vision changes. He denies extremity weakness. He denies hemoptysis. He reports some back and abdominal pain due to pancreatitis. The patient reports he is craving sweet foods, and is trying to keep up his  nutrition. He is able to swallow, but occasional has episodes of vomiting. He does have a feeding tube in place and is instilling approximately 4 nutritional supplement shakes daily; he has had a PEG tube for approximately 3 years.  The patient is accompanied by his fiancee today.  PREVIOUS RADIATION THERAPY: No  PAST MEDICAL HISTORY:  has a past medical history of Anxiety states (08/04/2010); Chronic airway obstruction, not elsewhere classified (08/04/2010); Chronic pain syndrome (08/04/2010); Chronic pancreatitis ([place]) (08/04/2010); Coronary artery disease; GERD (gastroesophageal reflux disease); Hiatal hernia; Insomnia (08/04/2010); Intellectual disability; Lung nodule (05/20/2016); Other abnormal glucose (08/04/2010); Pancreatitis chronic (08/04/2010); Protein calorie malnutrition ([place]) (2010); Seizure ([place]) (08/04/2010); Severe anemia (05/20/2016); 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    PAST SURGICAL HISTORY: Past Surgical History:  Procedure Laterality Date  . COLONOSCOPY WITH PROPOFOL N/A 06/26/2016   Procedure: COLONOSCOPY WITH PROPOFOL;  Surgeon: Rogene Houston, MD;  [facility];  Service: Endoscopy;  Laterality: N/A;  10:30  . CORONARY ANGIOPLASTY WITH STENT PLACEMENT    . ESOPHAGOGASTRODUODENOSCOPY N/A 01/18/2013   Procedure: ESOPHAGOGASTRODUODENOSCOPY (EGD);  Surgeon: Rogene Houston, MD;  [facility];  Service: Endoscopy;  Laterality: N/A;  . EUS  03/26/2009   [facility]  . GASTROSTOMY-JEJEUNOSTOMY TUBE CHANGE/PLACEMENT  12/11/2008   [facility]  . I&D EXTREMITY Left 09/12/2012   Procedure: IRRIGATION AND DEBRIDEMENT LEFT FIFTH FINGER;  Surgeon: Roseanne Kaufman, MD;  Location: [place];  Service: Orthopedics;  Laterality: Left;  . JEJUNOSTOMY FEEDING TUBE  07/13/2010  .  NERVE AND TENDON REPAIR Left 09/12/2012   Procedure:  NERVE AND TENDON REPAIR LEFT FIFTH FINGER;  Surgeon: Roseanne Kaufman, MD;  Location: [place];  Service: Orthopedics;  Laterality: Left;  . OPEN REDUCTION INTERNAL FIXATION (ORIF) METACARPAL Left 09/12/2012   Procedure: OPEN REDUCTION INTERNAL FIXATION LEFT FIFTH FINGER;  Surgeon: Roseanne Kaufman, MD;  Location: [place];  Service: Orthopedics;  Laterality: Left;  . PANCREATIC PSEUDOCYST DRAINAGE  07/13/2010  . PORTACATH PLACEMENT  11/28/2008   [facility]  . VIDEO BRONCHOSCOPY WITH ENDOBRONCHIAL ULTRASOUND N/A 07/24/2016   Procedure: VIDEO BRONCHOSCOPY WITH ENDOBRONCHIAL ULTRASOUND;  Surgeon: Grace Isaac, MD;  Location: [place];  Service: Thoracic;  Laterality: N/A;    FAMILY HISTORY: family history is not on file.  SOCIAL HISTORY:  reports that he has been smoking Cigarettes.  He has a 20.00 pack-year smoking history. He has never used smokeless tobacco. He reports that he does not drink alcohol or use drugs. The patient lives in [place], [name].  ALLERGIES: Latex  MEDICATIONS:  Current Outpatient Prescriptions  Medication Sig Dispense Refill  . Adhesive Tape (CLOTH ADHESIVE SURG 1/2"X10YD) TAPE 1 each by Does not apply route 2 (two) times daily as needed. (Patient not taking: Reported on 07/24/2016) 1 each 2  . albuterol (PROVENTIL HFA;VENTOLIN HFA) 108 (90 BASE) MCG/ACT inhaler Inhale 2 puffs into the lungs every 6 (six) hours as needed for wheezing or shortness of breath.     . CREON 24000-76000 units CPEP TAKE TWO CAPSULES BY MOUTH BEFORE MEALS (Patient taking differently: TAKE 19509 UNITS BY MOUTH BEFORE MEALS) 168 each 3  . DULoxetine (CYMBALTA) 30 MG capsule Take 1 capsule (30 mg total) by mouth 2 (two) times daily. 60 capsule 5  . ferrous sulfate 325 (65 FE) MG tablet TAKE ONE (1) TABLET BY MOUTH EVERY DAY WITH BREAKFAST (Patient taking differently: Take 325 mg by mouth daily with breakfast. ) 30 tablet 3  . gabapentin (NEURONTIN) 300 MG capsule Take 1 capsule (300 mg total) by mouth 4 (four)  times daily as needed. (Patient taking differently: Take 600 mg by mouth 3 (three) times daily. ) 180 capsule 3  . Gauze Pads & Dressings (BIOGUARD GAUZE SPONGES) 4"X4" PADS 1 each by Does not apply route 2 (two) times daily. (Patient not taking: Reported on 07/24/2016) 1 each 5  . hydrocortisone cream 0.5 % Apply 1 application topically 2 (two) times daily. 30 g 0  . mirtazapine (REMERON) 15 MG tablet TAKE ONE TABLET BY MOUTH EVERY EVENING (Patient taking differently: TAKE 15 mg BY MOUTH EVERY EVENING) 30 tablet 3  . Nutritional Supplements (FEEDING SUPPLEMENT, VITAL AF 1.2 CAL,) LIQD Place 1,000 mLs into feeding tube daily. At 7ml/hr    . ondansetron (ZOFRAN) 4 MG tablet Take 1 tablet (4 mg total) by mouth every 6 (six) hours as needed for nausea. 20 tablet 0  . Oxycodone HCl 10 MG TABS Take 1 tablet (10 mg total) by mouth every 6 (six) hours as needed. (Patient not taking: Reported on 07/30/2016) 60 tablet 0  . pantoprazole (PROTONIX) 40 MG tablet TAKE ONE (1) TABLET BY MOUTH EVERY DAY (Patient taking differently: TAKE 40 MG BY MOUTH EVERY DAY) 30 tablet 3  . phenytoin (DILANTIN) 100 MG ER capsule Take 2 capsules (200 mg total) by mouth at bedtime. 60 capsule 5  . prochlorperazine (COMPAZINE) 10 MG tablet Take 1 tablet (10 mg total) by mouth every 6 (six) hours as needed for nausea or vomiting. 30 tablet 0  . promethazine (PHENERGAN)  25 MG tablet Take 25 mg by mouth every 6 (six) hours as needed for nausea or vomiting.     . ranitidine (ZANTAC) 150 MG capsule TAKE 1 CAPSULE BY MOUTH TWICE DAILY (Patient taking differently: Take 150 mg by mouth twice daily) 60 capsule 3  . vitamin C (ASCORBIC ACID) 500 MG tablet Take 1 tablet (500 mg total) by mouth daily. 30 tablet 3  . Water For Irrigation, Sterile (FREE WATER) SOLN Place 200 mLs into feeding tube every 8 (eight) hours.     No current facility-administered medications for this encounter.     REVIEW OF SYSTEMS:  A 10+ POINT REVIEW OF SYSTEMS WAS  OBTAINED including neurology, dermatology, psychiatry, cardiac, respiratory, lymph, extremities, GI, GU, musculoskeletal, constitutional, reproductive, HEENT. All pertinent positives are noted in the HPI. All others are negative.   PHYSICAL EXAM:  Vitals with BMI 07/30/2016  Height 5\' 6"   Weight 100 lbs  BMI 25.9  Systolic 563  Diastolic 75  Pulse 75  Respirations 17  General: Alert and oriented, in no acute distress. The patient is very thin appearing. HEENT: Head is normocephalic. Oropharynx is clear. Patient is edentulous. Neck: Neck is supple, no palpable cervical or supraclavicular lymphadenopathy. Heart: Regular in rate and rhythm with no murmurs. Chest: Clear to auscultation bilaterally. Abdomen: Soft, non tender, non distended. Feeding tube in place. Extremities: No edema. Lymphatics: see Neck Exam Skin: No concerning lesions. Musculoskeletal: Patient is weaker on left side and reports this is due to prior stroke. Neurologic:  Speech is fluent. Coordination is intact. Psychiatric: Judgment and insight are intact. Affect is appropriate.   ECOG = 2    LABORATORY DATA:  Lab Results  Component Value Date   WBC 10.9 (H) 07/30/2016   HGB 12.1 (L) 07/30/2016   HCT 35.5 (L) 07/30/2016   MCV 93.9 07/30/2016   PLT 233 07/30/2016   Lab Results  Component Value Date   NA 139 07/30/2016   K 3.9 07/30/2016   CL 110 07/24/2016   CO2 23 07/30/2016   Lab Results  Component Value Date   ALT 10 07/30/2016   AST 14 07/30/2016   ALKPHOS 139 07/30/2016   BILITOT <0.22 07/30/2016    PULMONARY FUNCTION TEST:   Recent Review Flowsheet Data    There is no flowsheet data to display.      RADIOGRAPHY: Dg Chest Port 1 View  Result Date: 07/24/2016 CLINICAL DATA:  Left lung mass. Non-small cell lung cancer. Status post bronchoscopy EXAM: PORTABLE CHEST 1 VIEW COMPARISON:  07/23/2016 CT FINDINGS: Known left upper lobe mass and neighboring mediastinal adenopathy. No postprocedural  collapse, edema, or pneumothorax. Normal heart size. Epigastric will embolization clips. IMPRESSION: 1. No acute finding after bronchoscopy. 2. Left upper lobe mass and adenopathy. Electronically Signed   By: Monte Fantasia M.D.   On: 07/24/2016 14:03   Ct Super D Chest Wo Contrast  Result Date: 07/24/2016 CLINICAL DATA:  56 year old male with history of left lung mass and shortness of breath. Evaluate for potential malignancy. EXAM: CT CHEST WITHOUT CONTRAST TECHNIQUE: Multidetector CT imaging of the chest was performed using thin slice collimation for electromagnetic bronchoscopy planning purposes, without intravenous contrast. COMPARISON:  Chest CT 04/28/2016.  PET-CT 06/17/2016. FINDINGS: Cardiovascular: Heart size is normal. There is no significant pericardial fluid, thickening or pericardial calcification. There is aortic atherosclerosis, as well as atherosclerosis of the great vessels of the mediastinum and the coronary arteries, including calcified atherosclerotic plaque in the left main, left anterior descending and  right coronary arteries. Mediastinum/Nodes: Enlarged AP window lymph node measuring 2.1 cm in short axis, similar to the prior study. Soft tissue prominence in the anterior aspect of the left hilum superiorly, suspicious for underlying lymphadenopathy (poorly evaluated on today's noncontrast CT examination). Small hiatal hernia. No axillary lymphadenopathy. Lungs/Pleura: Previously noted left upper lobe pulmonary nodule is stable in size measuring 2.2 x 2.0 cm (axial image 44 of series 5). This again makes broad contact with the pleura along its medial margin. Several other scattered pulmonary nodules are noted throughout both lungs, measuring 4 mm or less in size, stable in size, number and distribution compared to prior examinations, favored to be benign. Mild diffuse bronchial wall thickening with mild centrilobular and paraseptal emphysema. No acute consolidative airspace disease. No  pleural effusions. Upper Abdomen: Percutaneous gastrostomy tube with retention balloon in the gastric lumen. Numerous calcified granulomas noted throughout the liver and spleen. Aortic atherosclerosis. Extensive parenchymal calcifications throughout the pancreas, presumably sequela of chronic pancreatitis. Musculoskeletal: There are no aggressive appearing lytic or blastic lesions noted in the visualized portions of the skeleton. IMPRESSION: 1. Left upper lobe pulmonary nodule measuring 2.2 x 2.0 cm with 2.1 cm short axis AP window lymph node and prominent soft tissue in the left hilar region) suspicious for additional lymphadenopathy), stable compared to prior PET-CT 06/17/2016. 2. Other tiny pulmonary nodules scattered throughout both lungs measuring 4 mm or less in size are also stable compared to the prior examination and nonspecific but favored to be benign. 3. Aortic atherosclerosis, in addition to left main and 2 vessel coronary artery disease. Please note that although the presence of coronary artery calcium documents the presence of coronary artery disease, the severity of this disease and any potential stenosis cannot be assessed on this non-gated CT examination. Assessment for potential risk factor modification, dietary therapy or pharmacologic therapy may be warranted, if clinically indicated. 4. Diffuse bronchial wall thickening with mild centrilobular and paraseptal emphysema; imaging findings suggestive of underlying COPD. 5. Additional incidental findings, as above. Aortic Atherosclerosis (ICD10-I70.0) and Emphysema (ICD10-J43.9). Electronically Signed   By: Vinnie Langton M.D.   On: 07/24/2016 08:53    IMPRESSION:Limited stage, presumptive stage IIIA (T1b N2 M0) small cell lung cancer.  Today, I talked to the patient and family about the findings and work-up thus far.  We discussed the natural history of lung cancer and general treatment, highlighting the role of radiotherapy in the management.   We discussed the available radiation techniques, and focused on the details of logistics and delivery.  We reviewed the anticipated acute and late sequelae associated with radiation in this setting.  The patient would be a good candidate for 6 weeks of radiation therapy. The patient was encouraged to ask questions that I answered to the best of my ability. The patient would like to proceed with radiation and will be scheduled for CT simulation. A consent form was discussed and signed.  PLAN: The patient will proceed with MRI. Next week the patient will proceed with simulation and treatment planning, with plans to begin radiation therapy soon thereafter. The patient is scheduled for simulation on Monday 7/23 at 11 am.     ------------------------------------------------  Blair Promise, PhD, MD  This document serves as a record of services personally performed by Gery Pray, MD. It was created on his behalf by Maryla Morrow, a trained medical scribe. The creation of this record is based on the scribe's personal observations and the provider's statements to them. This document has  been checked and approved by the attending provider.

## 2016-07-30 NOTE — Therapy (Signed)
[name], [name], [address] Phone: [phone]   Fax:  [phone]  Physical Therapy Evaluation  Patient Details  Name: Marcus Brooks MRN: [id number] Date of Birth: 04/03/1960 Referring Provider: Dr. Curt Bears  Encounter Date: 07/30/2016      PT End of Session - 07/30/16 1551    Visit Number 1   Number of Visits 1   PT Start Time 1505   PT Stop Time 1525   PT Time Calculation (min) 20 min   Activity Tolerance Patient tolerated treatment well   Behavior During Therapy [facility] for tasks assessed/performed      Past Medical History:  Diagnosis Date  . Anxiety states 08/04/2010  . Chronic airway obstruction, not elsewhere classified 08/04/2010  . Chronic pain syndrome 08/04/2010  . Chronic pancreatitis ([place]) 08/04/2010  . Coronary artery disease   . GERD (gastroesophageal reflux disease)   . Hiatal hernia    EGD 01/18/13  . Insomnia 08/04/2010  . Intellectual disability   . Lung nodule 05/20/2016  . Other abnormal glucose 08/04/2010  . Pancreatitis chronic 08/04/2010  . Protein calorie malnutrition ([place]) 2010  . Seizure ([place]) 08/04/2010  . Severe anemia 05/20/2016  . 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 Splenic vein thrombosis    Chronic.  . Tobacco use disorder 08/04/2010  . Unspecified essential hypertension 08/04/2010    Past Surgical History:  Procedure Laterality Date  . COLONOSCOPY WITH PROPOFOL N/A 06/26/2016   Procedure: COLONOSCOPY WITH PROPOFOL;  Surgeon: Rogene Houston, MD;  [facility];  Service: Endoscopy;  Laterality: N/A;  10:30  . CORONARY ANGIOPLASTY WITH STENT PLACEMENT    . ESOPHAGOGASTRODUODENOSCOPY N/A 01/18/2013   Procedure: ESOPHAGOGASTRODUODENOSCOPY (EGD);  Surgeon: Rogene Houston, MD;  [facility];  Service: Endoscopy;  Laterality: N/A;  . EUS  03/26/2009   [facility]  . GASTROSTOMY-JEJEUNOSTOMY TUBE  CHANGE/PLACEMENT  12/11/2008   [facility] SHICK  . I&D EXTREMITY Left 09/12/2012   Procedure: IRRIGATION AND DEBRIDEMENT LEFT FIFTH FINGER;  Surgeon: Roseanne Kaufman, MD;  Location: [facility];  Service: Orthopedics;  Laterality: Left;  . JEJUNOSTOMY FEEDING TUBE  07/13/2010  . NERVE AND TENDON REPAIR Left 09/12/2012   Procedure: NERVE AND TENDON REPAIR LEFT FIFTH FINGER;  Surgeon: Roseanne Kaufman, MD;  Location: [facility];  Service: Orthopedics;  Laterality: Left;  . OPEN REDUCTION INTERNAL FIXATION (ORIF) METACARPAL Left 09/12/2012   Procedure: OPEN REDUCTION INTERNAL FIXATION LEFT FIFTH FINGER;  Surgeon: Roseanne Kaufman, MD;  Location: [facility];  Service: Orthopedics;  Laterality: Left;  . PANCREATIC PSEUDOCYST DRAINAGE  07/13/2010  . PORTACATH PLACEMENT  11/28/2008   FLEISHMAN  . VIDEO BRONCHOSCOPY WITH ENDOBRONCHIAL ULTRASOUND N/A 07/24/2016   Procedure: VIDEO BRONCHOSCOPY WITH ENDOBRONCHIAL ULTRASOUND;  Surgeon: Grace Isaac, MD;  Location: [facility];  Service: Thoracic;  Laterality: N/A;    There were no vitals filed for this visit.       Subjective Assessment - 07/30/16 1536    Subjective Reports constant stomach pain. Says he has permanent leg damage that causes tingling in left leg; says he was told he had mini-strokes, but he does not remember it, about a year ago.    Patient is accompained by: --  fiancee   Pertinent History Incidental finding of left upper lobe nodule when being worked up for weight loss. Scans show ipsilateral adenopathy; biopsy indicated small cell carcinoma, considered limited stage. Expected to have chemoradiation. Pt.  is malnourished from chronic pancreatitis; has a G-tube.  Current smoker.   Patient Stated Goals get info from all lung clinic providers   Currently in Pain? Yes   Pain Score 7    Pain Location Abdomen   Pain Descriptors / Indicators Constant;Squeezing;Tightness   Pain Type Chronic pain   Aggravating Factors  lying down   Pain Relieving Factors medicine             [facility] PT Assessment - 07/30/16 0001      Assessment   Medical Diagnosis small cell lung cancer   Referring Provider Dr. Curt Bears   Onset Date/Surgical Date 04/28/16   Hand Dominance Right   Prior Therapy none     Precautions   Precautions Other (comment)   Precaution Comments cancer precautions     Restrictions   Weight Bearing Restrictions No     Balance Screen   Has the patient fallen in the past 6 months No   Has the patient had a decrease in activity level because of a fear of falling?  No   Is the patient reluctant to leave their home because of a fear of falling?  No     Home Ecologist residence   Living Arrangements Non-relatives/Friends   Type of Home Apartment   Home Layout Two level   Alternate Level Stairs-Number of Steps 13   Alternate Level Stairs-Rails --  yes     Prior Function   Level of Independence Independent   Leisure no regular exercise other than climbing the stairs in his apartment, occasional walks at [employer]   Overall Cognitive Status Within Functional Limits for tasks assessed     Observation/Other Assessments   Observations very thin gentleman     Coordination   Gross Motor Movements are Fluid and Coordinated Yes     Functional Tests   Functional tests Sit to Stand     Sit to Stand   Comments 9 times in 30 seconds, below average for age  legs quite fatigued with this     Posture/Postural Control   Posture/Postural Control Postural limitations   Postural Limitations Rounded Shoulders;Forward head  significant     ROM / Strength   AROM / PROM / Strength AROM     AROM   Overall AROM Comments standing trunk AROM: flexion WFL, extension 50% loss due to stomach pain; sidebend 20% loss; rotation WFL bilat.     Ambulation/Gait   Ambulation/Gait Yes   Ambulation/Gait Assistance 7: Independent     Balance   Balance Assessed Yes     Dynamic Standing Balance   Dynamic  Standing - Comments reaches forward 12 inches in standing, below average for age     Functional Gait  Assessment   Gait assessed  No            Objective measurements completed on examination: See above findings.                  PT Education - 07/30/16 1551    Education provided Yes   Education Details energy conservation, walking, CURE article on staying active, posture, breathing, PT info   Person(s) Educated Patient;Other (comment)  fiancee   Methods Explanation;Handout   Comprehension Verbalized understanding               Lung Clinic Goals - 07/30/16 1556      Patient will be able to verbalize understanding of the  benefit of exercise to decrease fatigue.   Status Achieved     Patient will be able to verbalize the importance of posture.   Status Achieved     Patient will be able to demonstrate diaphragmatic breathing for improved lung function.   Status Achieved     Patient will be able to verbalize understanding of the role of physical therapy to prevent functional decline and who to contact if physical therapy is needed.   Status Achieved              Plan - 07/30/16 1552    Clinical Impression Statement Pt. with new diagnosis of small cell lung cancer and expected to be treated with chemoradiation.  He is very thin as a result of malnutrition from chronic pancreatitis. This is painful and also limits his trunk ROM.  He has poor posture, decreased reach in standing and decreased 30 second sit to stand for his age.   History and Personal Factors relevant to plan of care: malnutrition, stomach pain, left leg injury   Clinical Presentation Evolving   Clinical Presentation due to: just diagnosed and about to begin treatment for small cell lung cancer   Clinical Decision Making Moderate   Rehab Potential Fair   PT Frequency One time visit   PT Treatment/Interventions Patient/family education   PT Next Visit Plan None at this time.   PT  Home Exercise Plan walking, breathing exercise   Consulted and Agree with Plan of Care Patient      Patient will benefit from skilled therapeutic intervention in order to improve the following deficits and impairments:  Postural dysfunction, Pain, Decreased range of motion, Decreased strength  Visit Diagnosis: Abnormal posture - Plan: PT plan of care cert/re-cert  Muscle weakness (generalized) - Plan: PT plan of care cert/re-cert     Problem List Patient Active Problem List   Diagnosis Date Noted  . 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 Encounter for antineoplastic chemotherapy 07/30/2016  . Goals of care, counseling/discussion 07/30/2016  . Lung nodule 05/20/2016  . Symptomatic anemia 05/20/2016  . Pancreatic lesion   . Malnutrition ([place])   . Phlegmon of pancreas 05/09/2016  . Acute on chronic pancreatitis ([place]) 05/09/2016  . Lymphadenopathy 05/09/2016  . Hx of colonic polyps 04/23/2016  . Atherosclerosis of aorta ([place]) 04/09/2016  . History of colonic polyps 02/19/2016  . Dizziness 06/10/2015  . Pancreatic pseudocyst 03/13/2013  . Cellulitis 01/24/2013  . Gastritis 01/24/2013  . Pancreatitis 01/23/2013  . Irritation around percutaneous endoscopic gastrostomy (PEG) tube site ([place]) 01/23/2013  . Hyponatremia 01/19/2013  . Hiatal hernia 01/19/2013  . Acute blood loss anemia 01/18/2013  . 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 Coronary artery disease   . Upper GI bleed 01/17/2013  . Chronic pancreatitis ([place]) 01/17/2013  . Protein-calorie malnutrition, severe ([place]) 01/10/2013  . Hematemesis 01/09/2013  . Pancreatitis, acute 01/09/2013  . Seizure disorder ([place]) 01/09/2013  . Syncope 01/09/2013  . Acute pancreatitis 10/03/2012  . Leukocytosis 10/03/2012  . Transaminitis 10/03/2012  . Chest pain 10/03/2012  . Pain around PEG tube site 03/09/2011  . Hypertension 11/05/2010  . GERD (gastroesophageal reflux disease) 11/05/2010  . Chronic  pain syndrome 08/04/2010  . Tobacco use disorder 08/04/2010    Felita Bump 07/30/2016, 3:58 PM  [place], [name], [address] Phone: [phone]   Fax:  [phone]  Name: Marcus Brooks MRN: [id number] Date of Birth: 1960-11-02  Serafina Royals, PT 07/30/16 3:59 PM

## 2016-07-30 NOTE — Telephone Encounter (Signed)
Scheduled appt per 7/19 los - Gave patient AVS and calender per los. . New start on 8/1 instead of 7/31 due to capped day - okay per Jim Like . [facility] to contact patient with MRI schedule.

## 2016-07-31 ENCOUNTER — Encounter: Payer: Self-pay | Admitting: *Deleted

## 2016-07-31 NOTE — Progress Notes (Signed)
[facility] Clinical Social Work  Clinical Social Work met with patient/family and Futures trader at [facility] appointment to offer support and assess for psychosocial needs.  Medical oncologist reviewed patient's diagnosis and recommended treatment plan with patient/family. Marcus Brooks was accompanied by his fiance, Marcus Brooks.  Marcus Brooks lives in [place]. The patient shared he has experienced significant anxiety regarding his new cancer diagnosis and has limited positive coping skills to utilize.  He expressed the desire to quit smoking, but having difficulty fully quitting.  The patient is prescribed anxiety and depression medications by his PCP.  He was not interested in counseling at this time, but plans to follow up with CSW as needed.  ONCBCN DISTRESS SCREENING 07/31/2016  Screening Type Initial Screening  Distress experienced in past week (1-10) 8  Practical problem type Housing  Family Problem type Other (comment)  Emotional problem type Depression;Nervousness/Anxiety;Adjusting to illness;Isolation/feeling alone;Feeling hopeless  Information Concerns Type Lack of info about maintaining fitness  Physical Problem type Pain;Nausea/vomiting;Sleep/insomnia;Breathing;Loss of appetitie;Tingling hands/feet;Sexual problems  Referral to clinical social work Yes  Referral to dietition Yes      Clinical Social Work briefly discussed Clinical Social Work role and [employer] support programs/services.  Clinical Social Work encouraged patient to call with any additional questions or concerns.   Maryjean Morn, MSW, LCSW, OSW-C Clinical Social Worker [facility] [phone]

## 2016-08-03 ENCOUNTER — Ambulatory Visit
Admission: RE | Admit: 2016-08-03 | Discharge: 2016-08-03 | Disposition: A | Discharge status: Home / Self Care | Payer: Medicaid Other | Source: Ambulatory Visit | Attending: Radiation Oncology | Admitting: Radiation Oncology

## 2016-08-03 ENCOUNTER — Encounter: Payer: Self-pay | Admitting: *Deleted

## 2016-08-03 DIAGNOSIS — C3412 Malignant neoplasm of upper lobe, left bronchus or lung: Secondary | ICD-10-CM

## 2016-08-03 DIAGNOSIS — Z510 Encounter for antineoplastic radiation therapy: Secondary | ICD-10-CM | POA: Diagnosis present

## 2016-08-03 NOTE — Progress Notes (Signed)
  Radiation Oncology         [phone] ________________________________  Name: LIBAN GUEDES MRN: [id number]  Date: 08/03/2016  DOB: 10/27/1960  SIMULATION AND TREATMENT PLANNING NOTE    ICD-10-CM   1. 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  NARRATIVE:  The patient was brought to the [facility].  Identity was confirmed.  All relevant records and images related to the planned course of therapy were reviewed.  The patient freely provided informed written consent to proceed with treatment after reviewing the details related to the planned course of therapy. The consent form was witnessed and verified by the simulation staff.  Then, the patient was set-up in a stable reproducible  supine position for radiation therapy.  CT images were obtained.  Surface markings were placed.  The CT images were loaded into the planning software.  Then the target and avoidance structures were contoured.  Treatment planning then occurred.  The radiation prescription was entered and confirmed.  Then, I designed and supervised the construction of a total of 5 medically necessary complex treatment devices.  I have requested : 3D Simulation  I have requested a DVH of the following structures: Heart lungs, spinal cord,  esophagus, PTV, GTV.  I have ordered:dose calc.  PLAN:  The patient will receive 60 Gy in 30 fractions along with radiosensitizing chemotherapy.  Special Treatment Procedure Note: The patient will be receiving radiosensitizing chemotherapy. Given the potential of increased toxicities related to combined therapy and the necessity for close monitoring of the patient and blood work, this constitutes a special treatment procedure.  -----------------------------------  Blair Promise, PhD, MD

## 2016-08-03 NOTE — Progress Notes (Signed)
Oncology Nurse Navigator Documentation  Oncology Nurse Navigator Flowsheets 08/03/2016  Navigator Location [facility]  Navigator Encounter Type Clinic/MDC/I spoke with Mr. Marcus Brooks today.  I helped him get to rad onc for appt today.  I also update him to call central scheduling for an appt MRI brain.  I gave him the phone number and asked that he call me if he needed more assistance. He verbalized understanding to call for appt  Patient Visit Type RadOnc  Treatment Phase CT [facility]  Barriers/Navigation Needs Education  Education Other  Interventions Education  Education Method Verbal;Written  Acuity Level 2  Time Spent with Patient 30

## 2016-08-05 ENCOUNTER — Other Ambulatory Visit: Payer: Medicaid Other

## 2016-08-05 ENCOUNTER — Telehealth: Payer: Self-pay | Admitting: *Deleted

## 2016-08-05 DIAGNOSIS — Z510 Encounter for antineoplastic radiation therapy: Secondary | ICD-10-CM | POA: Diagnosis not present

## 2016-08-06 ENCOUNTER — Other Ambulatory Visit: Payer: Medicaid Other

## 2016-08-06 ENCOUNTER — Telehealth: Payer: Self-pay | Admitting: *Deleted

## 2016-08-06 ENCOUNTER — Encounter: Payer: Self-pay | Admitting: *Deleted

## 2016-08-06 NOTE — Telephone Encounter (Signed)
Marcus Brooks confirmed MRI appt tomorrow.

## 2016-08-06 NOTE — Telephone Encounter (Signed)
Asking for pain med.  Dr. Julien Nordmann not here.   Dr. Worthy Flank nurse suggested he get in touch with the FNP who gave him script last month or contact the pain clinic.  Verbalized understanding.

## 2016-08-07 ENCOUNTER — Ambulatory Visit (HOSPITAL_COMMUNITY): Admission: RE | Admit: 2016-08-07 | Payer: Medicaid Other | Source: Ambulatory Visit

## 2016-08-10 ENCOUNTER — Ambulatory Visit
Admission: RE | Admit: 2016-08-10 | Discharge: 2016-08-10 | Disposition: A | Discharge status: Home / Self Care | Payer: Medicaid Other | Source: Ambulatory Visit | Attending: Radiation Oncology | Admitting: Radiation Oncology

## 2016-08-10 ENCOUNTER — Telehealth: Payer: Self-pay | Admitting: *Deleted

## 2016-08-10 DIAGNOSIS — C3412 Malignant neoplasm of upper lobe, left bronchus or lung: Secondary | ICD-10-CM

## 2016-08-10 DIAGNOSIS — Z510 Encounter for antineoplastic radiation therapy: Secondary | ICD-10-CM | POA: Diagnosis not present

## 2016-08-10 NOTE — Telephone Encounter (Signed)
"  Fiance Myrtie Lovena Le [phone]) calling for pain medicine for Marcus Brooks.  Called last week.  What should I tell him about his pain?  Has pancreatitis.  Continuous pain rates a level 7 to 8 to his stomach and back.  No swelling, bloating, n/v, diarrhea or constipation.  Urinating well.  The oxycodone 10 mg, I pill every 6 hours was helping.  He ran out about four to six days ago."  Shared information from 08-06-2016 to consult ordering prescriber     "He was told on the last visit (07-10-2016) he cannot receive any more pain medicine.  A pain management  class was to be ordered but we want everything in [place].  Also limited to what can be prescribed.  See's regular provider tomorrow at 1:30 pm.  Will ask about refill for oxycodone 10 mg refill and if referral is still needed for pain clinic.   When are his radiation, chemotherapy and appointments to see Dr. Julien Nordmann?  Need Radiation appointments changed on September 4th and 5th. September 4th we need 7:45 am lab and 10:45 am radiation closer together.  [place] driving four hours on 09[place]15-2016 [place]  for traffic court the morning of September 5th.  Cancel September 5th or schedule a late evening appointment. "  Will notify Med Onc, Radiation Onc and PCP about these requests.  Possible controlled substance agreement.  Pain referral noted 06-24-2016 to [facility] [place].  First chemotherapy scheduled 08-12-2016 through 08-14-2016.

## 2016-08-10 NOTE — Progress Notes (Signed)
  Radiation Oncology         [phone] ________________________________  Name: Marcus Brooks MRN: [id number]  Date: 08/10/2016  DOB: April 30, 1960  Simulation Verification Note    ICD-10-CM   1. 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 The clinical setup was verified. Then port films were obtained and uploaded to the radiation oncology medical record software.  The treatment beams were carefully compared against the planned radiation fields. The position location and shape of the radiation fields was reviewed. They targeted volume of tissue appears to be appropriately covered by the radiation beams. Organs at risk appear to be excluded as planned.  Based on my personal review, I approved the simulation verification. The patient's treatment will proceed as planned.  -----------------------------------  Blair Promise, PhD, MD

## 2016-08-11 ENCOUNTER — Ambulatory Visit
Admission: RE | Admit: 2016-08-11 | Discharge: 2016-08-11 | Disposition: A | Discharge status: Home / Self Care | Payer: Medicaid Other | Source: Ambulatory Visit | Attending: Radiation Oncology | Admitting: Radiation Oncology

## 2016-08-11 ENCOUNTER — Ambulatory Visit (INDEPENDENT_AMBULATORY_CARE_PROVIDER_SITE_OTHER): Payer: Medicaid Other | Admitting: Family Medicine

## 2016-08-11 ENCOUNTER — Encounter: Payer: Self-pay | Admitting: Family Medicine

## 2016-08-11 VITALS — BP 132/57 | HR 59 | Temp 97.6°F | Resp 16 | Ht 66.0 in | Wt 103.0 lb

## 2016-08-11 DIAGNOSIS — Z79891 Long term (current) use of opiate analgesic: Secondary | ICD-10-CM

## 2016-08-11 DIAGNOSIS — G40909 Epilepsy, unspecified, not intractable, without status epilepticus: Secondary | ICD-10-CM | POA: Diagnosis not present

## 2016-08-11 DIAGNOSIS — Z510 Encounter for antineoplastic radiation therapy: Secondary | ICD-10-CM | POA: Diagnosis not present

## 2016-08-11 DIAGNOSIS — F17200 Nicotine dependence, unspecified, uncomplicated: Secondary | ICD-10-CM | POA: Diagnosis not present

## 2016-08-11 DIAGNOSIS — C3412 Malignant neoplasm of upper lobe, left bronchus or lung: Secondary | ICD-10-CM

## 2016-08-11 DIAGNOSIS — R52 Pain, unspecified: Secondary | ICD-10-CM

## 2016-08-11 MED ORDER — SONAFINE EX EMUL
1.0000 "application " | Freq: Once | CUTANEOUS | Status: AC
  Administered 2016-08-11: 1 via TOPICAL

## 2016-08-11 MED ORDER — OXYCODONE HCL 10 MG PO TABS: 10.0000 mg | ORAL_TABLET | Freq: Four times a day (QID) | ORAL | 0 refills | Status: DC | PRN

## 2016-08-11 NOTE — Progress Notes (Signed)
Subjective:    Patient ID: Marcus Brooks, male    DOB: 01-17-1960, 56 y.o.   MRN: [id number]  HPI Mr. Marcus Brooks, a 56 year old male with a history of seizure disorder, small cell lung cancer, and chronic pain presents for follow of chronic pain. Current pain intensity is 8/10 and constant. He has been taking Oxycodone 10 mg every 6 hours as needed for moderate to severe pain. He has also been referred to pain management. Mr. Marcus Brooks has had sustained relief on pain medication regimen. He has also been referred to pain management. Mr. Marcus Brooks  had a chest CT on 04/28/2016, which shows a 1.4 x 1.7 cm nodule along the left lateral margin of the aortic large. The nodule is isodense with the aorta. Left hilar and AP window lymphadenopathy. Overall appearance is most concerning for primary lung malignancy. A referral was sent to Dr. Julien Brooks at [facility] for further work up and evaluation. A PET scan was performed on 06/18/2016, he is scheduled to start chemotherapy on 08/12/2016.  Immunization History  Administered Date(s) Administered  . Influenza,inj,Quad PF,36+ Mos 01/10/2013  . PPD Test 01/27/2013  . Pneumococcal Polysaccharide-23 06/11/2015  . Tdap 09/12/2012   Past Surgical History:  Procedure Laterality Date  . COLONOSCOPY WITH PROPOFOL N/A 06/26/2016   Procedure: COLONOSCOPY WITH PROPOFOL;  Surgeon: Rogene Houston, MD;  [facility];  Service: Endoscopy;  Laterality: N/A;  10:30  . CORONARY ANGIOPLASTY WITH STENT PLACEMENT    . ESOPHAGOGASTRODUODENOSCOPY N/A 01/18/2013   Procedure: ESOPHAGOGASTRODUODENOSCOPY (EGD);  Surgeon: Rogene Houston, MD;  [facility];  Service: Endoscopy;  Laterality: N/A;  . EUS  03/26/2009   [facility]  . GASTROSTOMY-JEJEUNOSTOMY TUBE CHANGE/PLACEMENT  12/11/2008   Marcus Brooks  . I&D EXTREMITY Left 09/12/2012   Procedure: IRRIGATION AND DEBRIDEMENT LEFT FIFTH FINGER;  Surgeon: Roseanne Kaufman, MD;  Location: [place];   Service: Orthopedics;  Laterality: Left;  . JEJUNOSTOMY FEEDING TUBE  07/13/2010  . NERVE AND TENDON REPAIR Left 09/12/2012   Procedure: NERVE AND TENDON REPAIR LEFT FIFTH FINGER;  Surgeon: Roseanne Kaufman, MD;  Location: [place];  Service: Orthopedics;  Laterality: Left;  . OPEN REDUCTION INTERNAL FIXATION (ORIF) METACARPAL Left 09/12/2012   Procedure: OPEN REDUCTION INTERNAL FIXATION LEFT FIFTH FINGER;  Surgeon: Roseanne Kaufman, MD;  Location: [place];  Service: Orthopedics;  Laterality: Left;  . PANCREATIC PSEUDOCYST DRAINAGE  07/13/2010  . PORTACATH PLACEMENT  11/28/2008   [facility]  . VIDEO BRONCHOSCOPY WITH ENDOBRONCHIAL ULTRASOUND N/A 07/24/2016   Procedure: VIDEO BRONCHOSCOPY WITH ENDOBRONCHIAL ULTRASOUND;  Surgeon: Grace Isaac, MD;  Location: [place];  Service: Thoracic;  Laterality: N/A;    Review of Systems  Constitutional: Negative.  Negative for fatigue and fever.  HENT: Negative.   Eyes: Negative.  Negative for photophobia.  Respiratory: Negative.  Negative for chest tightness.   Cardiovascular: Negative.  Negative for palpitations and leg swelling.  Gastrointestinal: Positive for abdominal pain.  Endocrine: Negative.  Negative for polydipsia, polyphagia and polyuria.  Genitourinary: Negative.   Musculoskeletal: Positive for back pain.  Skin: Negative.   Allergic/Immunologic: Negative.  Negative for immunocompromised state.  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  Hematological: Negative.   Psychiatric/Behavioral: Positive for sleep disturbance.       Objective:   Physical Exam  Constitutional: He is oriented to person, place, and time. He appears well-developed. He appears cachectic. He has a sickly appearance.  HENT:  Head: Normocephalic and atraumatic.  Right Ear: External ear normal.  Left Ear: External ear normal.  Nose: Nose normal.  Mouth/Throat: Oropharynx is clear and moist. Abnormal dentition (partially  edentulous).  Eyes: Pupils are equal, round, and reactive to light. Conjunctivae and EOM are normal.  Neck: Trachea normal and normal range of motion. Neck supple.  Cardiovascular: Normal rate, regular rhythm, normal heart sounds and intact distal pulses.   Pulmonary/Chest: Effort normal and breath sounds normal.  Abdominal: Soft. Bowel sounds are normal. There is tenderness in the right upper quadrant and left upper quadrant.  LUG PEG tube, pus, or edema, tender to palpation.  Mild erythema  Musculoskeletal: Normal range of motion.  Neurological: He is oriented to person, place, and time. He has normal reflexes. He is unresponsive.  Skin: Skin is warm, dry and intact. No rash noted. No cyanosis. There is pallor. Nails show no clubbing.  Psychiatric: His speech is normal and behavior is normal. Thought content normal. He exhibits a depressed mood.      BP (!) 132/57 (BP Location: Left Arm, Patient Position: Sitting, Cuff Size: Small)   Pulse (!) 59   Temp 97.6 F (36.4 C) (Oral)   Resp 16   Ht 5\' 6"  (1.676 m)   Wt 103 lb (46.7 kg)   SpO2 99%   BMI 16.62 kg/m  Assessment & Plan:  1. Chronic prescription opiate use Reviewed [place] Substance Reporting system prior to prescribing opiate medications. No inconsistencies noted.   - Oxycodone HCl 10 MG TABS; Take 1 tablet (10 mg total) by mouth every 6 (six) hours as needed.  Dispense: 60 tablet; Refill: 0 - Pain Mgmt, Profile 8 w/Conf, U  2. Generalized pain Acute and chronic painful episodes - We agreed on Oxycodone. We discussed that pt is to receive his Schedule II prescriptions here. Pt is also aware that the prescription history is available to [place] online through the [facility] CSRS. Controlled substance agreement signed 06/02/2016. We reminded Kerri that all patients receiving Schedule II narcotics must be seen for follow-up within one month of prescription being requested. We reviewed the terms of our pain agreement, including the need to keep  medicines in a safe locked location away from children or pets, and the need to report excess sedation or constipation, measures to avoid constipation, and policies related to early refills and stolen prescriptions. According to the [place] [facility] program, we have reviewed details related to analgesia, adverse effects, aberrant behaviors. - Oxycodone HCl 10 MG TABS; Take 1 tablet (10 mg total) by mouth every 6 (six) hours as needed.  Dispense: 60 tablet; Refill: 0 - Pain Mgmt, Profile 8 w/Conf, U  3. Seizure disorder ([place]) Patient will begin Cysplatin on 08/12/2016 that will possible lower seizure thresh hold.  Will send a referral to neurology for further work up and evaluation  - Ambulatory referral to Neurology - Phenytoin level, total  4. Tobacco use disorder - nicotine (NICODERM CQ) 14 mg/24hr patch; Place 1 patch (14 mg total) onto the skin daily.  Dispense: 28 patch; Refill: 2   RTC: 1 month for chronic conditions   Donia Pounds  MSN, FNP-C [place] [address] [place],  [address] [phone]

## 2016-08-11 NOTE — Progress Notes (Signed)
Pt here for patient teaching.  Pt given Radiation and You booklet and Sonafine.  Reviewed areas of pertinence such as fatigue, skin changes and throat changes . Pt able to give teach back of to pat skin and use unscented/gentle soap,apply Sonafine bid and avoid applying anything to skin within 4 hours of treatment. Pt demonstrated understanding and verbalizes understanding of information given and will contact nursing with any questions or concerns.

## 2016-08-11 NOTE — Patient Instructions (Signed)
Will fax referral to [facility]   Seizure, Adult When you have a seizure:  Parts of your body may move.  How aware or awake (conscious) you are may change.  You may shake (convulse).  Some people have symptoms right before a seizure happens. These symptoms may include:  Fear.  Worry (anxiety).  Feeling like you are going to throw up (nausea).  Feeling like the room is spinning (vertigo).  Feeling like you saw or heard something before (deja vu).  Odd tastes or smells.  Changes in vision, such as seeing flashing lights or spots.  Seizures usually last from 30 seconds to 2 minutes. Usually, they are not harmful unless they last a long time. Follow these instructions at home: Medicines  Take over-the-counter and prescription medicines only as told by your doctor.  Avoid anything that may keep your medicine from working, such as alcohol. Activity  Do not do any activities that would be dangerous if you had another seizure, like driving or swimming. Wait until your doctor approves.  If you live in the [place] ask your local DMV (department of motor vehicles) when you can drive.  Rest. Teaching others  Teach friends and family what to do when you have a seizure. They should: ? Lay you on the ground. ? Protect your head and body. ? Loosen any tight clothing around your neck. ? Turn you on your side. ? Stay with you until you are better. ? Not hold you down. ? Not put anything in your mouth. ? Know whether or not you need emergency care. General instructions  Contact your doctor each time you have a seizure.  Avoid anything that gives you seizures.  Keep a seizure diary. Write down: ? What you think caused each seizure. ? What you remember about each seizure.  Keep all follow-up visits as told by your doctor. This is important. Contact a doctor if:  You have another seizure.  You have seizures more often.  There is any change in what happens  during your seizures.  You continue to have seizures with treatment.  You have symptoms of being sick or having an infection. Get help right away if:  You have a seizure: ? That lasts longer than 5 minutes. ? That is different than seizures you had before. ? That makes it harder to breathe. ? After you hurt your head.  After a seizure, you cannot speak or use a part of your body.  After a seizure, you are confused or have a bad headache.  You have two or more seizures in a row.  You are having seizures more often.  You do not wake up right after a seizure.  You get hurt during a seizure. In an emergency:  These symptoms may be an emergency. Do not wait to see if the symptoms will go away. Get medical help right away. Call your local emergency services (911 in the [place] Do not drive yourself to the hospital. This information is not intended to replace advice given to you by your health care provider. Make sure you discuss any questions you have with your health care provider. Document Released: 06/17/2007 Document Revised: 09/11/2015 Document Reviewed: 09/11/2015 Elsevier Interactive Patient Education  2017 [employer].

## 2016-08-12 ENCOUNTER — Ambulatory Visit
Admission: RE | Admit: 2016-08-12 | Discharge: 2016-08-12 | Disposition: A | Discharge status: Home / Self Care | Payer: Medicaid Other | Source: Ambulatory Visit | Attending: Radiation Oncology | Admitting: Radiation Oncology

## 2016-08-12 ENCOUNTER — Ambulatory Visit (HOSPITAL_BASED_OUTPATIENT_CLINIC_OR_DEPARTMENT_OTHER): Payer: Medicaid Other

## 2016-08-12 ENCOUNTER — Other Ambulatory Visit (HOSPITAL_BASED_OUTPATIENT_CLINIC_OR_DEPARTMENT_OTHER): Payer: Medicaid Other

## 2016-08-12 ENCOUNTER — Encounter: Payer: Self-pay | Admitting: Neurology

## 2016-08-12 VITALS — BP 146/73 | HR 83 | Temp 98.1°F | Resp 16

## 2016-08-12 DIAGNOSIS — Z5111 Encounter for antineoplastic chemotherapy: Secondary | ICD-10-CM

## 2016-08-12 DIAGNOSIS — C3412 Malignant neoplasm of upper lobe, left bronchus or lung: Secondary | ICD-10-CM | POA: Diagnosis not present

## 2016-08-12 DIAGNOSIS — Z7189 Other specified counseling: Secondary | ICD-10-CM

## 2016-08-12 DIAGNOSIS — Z510 Encounter for antineoplastic radiation therapy: Secondary | ICD-10-CM | POA: Diagnosis not present

## 2016-08-12 LAB — COMPREHENSIVE METABOLIC PANEL
ALBUMIN: 3.3 g/dL — AB (ref 3.5–5.0)
ALK PHOS: 142 U/L (ref 40–150)
ALT: 9 U/L (ref 0–55)
ANION GAP: 9 meq/L (ref 3–11)
AST: 12 U/L (ref 5–34)
BUN: 7.5 mg/dL (ref 7.0–26.0)
CALCIUM: 9.2 mg/dL (ref 8.4–10.4)
CO2: 23 mEq/L (ref 22–29)
Chloride: 106 mEq/L (ref 98–109)
Creatinine: 0.7 mg/dL (ref 0.7–1.3)
EGFR: >90 mL/min/{1.73_m2} (ref >90)
Glucose: 108 mg/dl (ref 70–140)
POTASSIUM: 3.6 meq/L (ref 3.5–5.1)
Sodium: 139 mEq/L (ref 136–145)
Total Bilirubin: 0.27 mg/dL (ref 0.20–1.20)
Total Protein: 7 g/dL (ref 6.4–8.3)

## 2016-08-12 LAB — CBC WITH DIFFERENTIAL/PLATELET
BASO%: 0.6 % (ref 0.0–2.0)
Basophils Absolute: 0.1 10*3/uL (ref 0.0–0.1)
EOS%: 3.2 % (ref 0.0–7.0)
Eosinophils Absolute: 0.3 10*3/uL (ref 0.0–0.5)
HEMATOCRIT: 33.3 % — AB (ref 38.4–49.9)
HEMOGLOBIN: 11.3 g/dL — AB (ref 13.0–17.1)
LYMPH#: 2.4 10*3/uL (ref 0.9–3.3)
LYMPH%: 23.1 % (ref 14.0–49.0)
MCH: 31.9 pg (ref 27.2–33.4)
MCHC: 33.9 g/dL (ref 32.0–36.0)
MCV: 94.1 fL (ref 79.3–98.0)
MONO#: 0.7 10*3/uL (ref 0.1–0.9)
MONO%: 6.3 % (ref 0.0–14.0)
NEUT#: 7 10*3/uL — ABNORMAL HIGH (ref 1.5–6.5)
NEUT%: 66.8 % (ref 39.0–75.0)
Platelets: 184 10*3/uL (ref 140–400)
RBC: 3.54 10*6/uL — ABNORMAL LOW (ref 4.20–5.82)
RDW: 13.9 % (ref 11.0–14.6)
WBC: 10.5 10*3/uL — ABNORMAL HIGH (ref 4.0–10.3)

## 2016-08-12 LAB — PHENYTOIN LEVEL, TOTAL: PHENYTOIN LVL: 14.1 mg/L (ref 10.0–20.0)

## 2016-08-12 LAB — MAGNESIUM: MAGNESIUM: 1.8 mg/dL (ref 1.5–2.5)

## 2016-08-12 MED ORDER — ACETAMINOPHEN 500 MG PO TABS
ORAL_TABLET | ORAL | Status: AC
  Filled 2016-08-12: qty 2

## 2016-08-12 MED ORDER — SODIUM CHLORIDE 0.9 % IV SOLN
Freq: Once | INTRAVENOUS | Status: AC
  Administered 2016-08-12: 10:00:00 via INTRAVENOUS

## 2016-08-12 MED ORDER — LORAZEPAM 2 MG/ML IJ SOLN
INTRAMUSCULAR | Status: AC
  Filled 2016-08-12: qty 1

## 2016-08-12 MED ORDER — POTASSIUM CHLORIDE 2 MEQ/ML IV SOLN
Freq: Once | INTRAVENOUS | Status: AC
  Administered 2016-08-12: 10:00:00 via INTRAVENOUS
  Filled 2016-08-12: qty 10

## 2016-08-12 MED ORDER — LORAZEPAM 2 MG/ML IJ SOLN
0.5000 mg | Freq: Once | INTRAMUSCULAR | Status: AC
  Administered 2016-08-12: 0.5 mg via INTRAVENOUS

## 2016-08-12 MED ORDER — PALONOSETRON HCL INJECTION 0.25 MG/5ML
0.2500 mg | Freq: Once | INTRAVENOUS | Status: AC
  Administered 2016-08-12: 0.25 mg via INTRAVENOUS

## 2016-08-12 MED ORDER — SODIUM CHLORIDE 0.9 % IV SOLN
120.0000 mg/m2 | Freq: Once | INTRAVENOUS | Status: AC
  Administered 2016-08-12: 170 mg via INTRAVENOUS
  Filled 2016-08-12: qty 8.5

## 2016-08-12 MED ORDER — SODIUM CHLORIDE 0.9 % IV SOLN
60.0000 mg/m2 | Freq: Once | INTRAVENOUS | Status: AC
  Administered 2016-08-12: 87 mg via INTRAVENOUS
  Filled 2016-08-12: qty 87

## 2016-08-12 MED ORDER — ACETAMINOPHEN 500 MG PO TABS
1000.0000 mg | ORAL_TABLET | Freq: Once | ORAL | Status: AC
  Administered 2016-08-12: 1000 mg via ORAL

## 2016-08-12 MED ORDER — PALONOSETRON HCL INJECTION 0.25 MG/5ML
INTRAVENOUS | Status: AC
  Filled 2016-08-12: qty 5

## 2016-08-12 MED ORDER — NICOTINE 14 MG/24HR TD PT24: 14.0000 mg | MEDICATED_PATCH | Freq: Every day | TRANSDERMAL | 2 refills | Status: DC

## 2016-08-12 MED ORDER — FOSAPREPITANT DIMEGLUMINE INJECTION 150 MG
Freq: Once | INTRAVENOUS | Status: AC
  Administered 2016-08-12: 13:00:00 via INTRAVENOUS
  Filled 2016-08-12: qty 5

## 2016-08-12 NOTE — Patient Instructions (Signed)
[facility] Brooks Discharge Instructions for Patients Receiving Chemotherapy  Today you received the following chemotherapy agents:  Etoposide and Cisplatin.  To help prevent nausea and vomiting after your treatment, we encourage you to take your nausea medication as directed.   If you develop nausea and vomiting that is not controlled by your nausea medication, call the clinic.   BELOW ARE SYMPTOMS THAT SHOULD BE REPORTED IMMEDIATELY:  *FEVER GREATER THAN 100.5 F  *CHILLS WITH OR WITHOUT FEVER  NAUSEA AND VOMITING THAT IS NOT CONTROLLED WITH YOUR NAUSEA MEDICATION  *UNUSUAL SHORTNESS OF BREATH  *UNUSUAL BRUISING OR BLEEDING  TENDERNESS IN MOUTH AND THROAT WITH OR WITHOUT PRESENCE OF ULCERS  *URINARY PROBLEMS  *BOWEL PROBLEMS  UNUSUAL RASH Items with * indicate a potential emergency and should be followed up as soon as possible.  Feel free to call the clinic you have any questions or concerns. The clinic phone number is [phone].  Please show the [facility] at check-in to the Emergency Department and triage nurse.  Etoposide, VP-16 injection What is this medicine? ETOPOSIDE, VP-16 (e toe POE side) is a chemotherapy drug. It is used to treat testicular cancer, lung cancer, and other cancers. This medicine may be used for other purposes; ask your health care provider or pharmacist if you have questions. COMMON BRAND NAME(S): Etopophos, Toposar, VePesid What should I tell my health care provider before I take this medicine? They need to know if you have any of these conditions: -infection -kidney disease -liver disease -low blood counts, like low white cell, platelet, or red cell counts -an unusual or allergic reaction to etoposide, other medicines, foods, dyes, or preservatives -pregnant or trying to get pregnant -breast-feeding How should I use this medicine? This medicine is for infusion into a vein. It is administered in a hospital or clinic by a  specially trained health care professional. Talk to your pediatrician regarding the use of this medicine in children. Special care may be needed. Overdosage: If you think you have taken too much of this medicine contact a poison control center or emergency room at once. NOTE: This medicine is only for you. Do not share this medicine with others. What if I miss a dose? It is important not to miss your dose. Call your doctor or health care professional if you are unable to keep an appointment. What may interact with this medicine? -aspirin -certain medications for seizures like carbamazepine, phenobarbital, phenytoin, valproic acid -cyclosporine -levamisole -warfarin This list may not describe all possible interactions. Give your health care provider a list of all the medicines, herbs, non-prescription drugs, or dietary supplements you use. Also tell them if you smoke, drink alcohol, or use illegal drugs. Some items may interact with your medicine. What should I watch for while using this medicine? Visit your doctor for checks on your progress. This drug may make you feel generally unwell. This is not uncommon, as chemotherapy can affect healthy cells as well as cancer cells. Report any side effects. Continue your course of treatment even though you feel ill unless your doctor tells you to stop. In some cases, you may be given additional medicines to help with side effects. Follow all directions for their use. Call your doctor or health care professional for advice if you get a fever, chills or sore throat, or other symptoms of a cold or flu. Do not treat yourself. This drug decreases your body's ability to fight infections. Try to avoid being around people who are  sick. This medicine may increase your risk to bruise or bleed. Call your doctor or health care professional if you notice any unusual bleeding. Talk to your doctor about your risk of cancer. You may be more at risk for certain types of  cancers if you take this medicine. Do not become pregnant while taking this medicine or for at least 6 months after stopping it. Women should inform their doctor if they wish to become pregnant or think they might be pregnant. Women of child-bearing potential will need to have a negative pregnancy test before starting this medicine. There is a potential for serious side effects to an unborn child. Talk to your health care professional or pharmacist for more information. Do not breast-feed an infant while taking this medicine. Men must use a latex condom during sexual contact with a woman while taking this medicine and for at least 4 months after stopping it. A latex condom is needed even if you have had a vasectomy. Contact your doctor right away if your partner becomes pregnant. Do not donate sperm while taking this medicine and for at least 4 months after you stop taking this medicine. Men should inform their doctors if they wish to father a child. This medicine may lower sperm counts. What side effects may I notice from receiving this medicine? Side effects that you should report to your doctor or health care professional as soon as possible: -allergic reactions like skin rash, itching or hives, swelling of the face, lips, or tongue -low blood counts - this medicine may decrease the number of white blood cells, red blood cells and platelets. You may be at increased risk for infections and bleeding. -signs of infection - fever or chills, cough, sore throat, pain or difficulty passing urine -signs of decreased platelets or bleeding - bruising, pinpoint red spots on the skin, black, tarry stools, blood in the urine -signs of decreased red blood cells - unusually weak or tired, fainting spells, lightheadedness -breathing problems -changes in vision -mouth or throat sores or ulcers -pain, redness, swelling or irritation at the injection site -pain, tingling, numbness in the hands or feet -redness,  blistering, peeling or loosening of the skin, including inside the mouth -seizures -vomiting Side effects that usually do not require medical attention (report to your doctor or health care professional if they continue or are bothersome): -diarrhea -hair loss -loss of appetite -nausea -stomach pain This list may not describe all possible side effects. Call your doctor for medical advice about side effects. You may report side effects to FDA at 1-800-FDA-1088. Where should I keep my medicine? This drug is given in a hospital or clinic and will not be stored at home. NOTE: This sheet is a summary. It may not cover all possible information. If you have questions about this medicine, talk to your doctor, pharmacist, or health care provider.  2018 Elsevier/Gold Standard (2014-12-21 11:53:23)  Cisplatin injection What is this medicine? CISPLATIN (SIS pla tin) is a chemotherapy drug. It targets fast dividing cells, like cancer cells, and causes these cells to die. This medicine is used to treat many types of cancer like bladder, ovarian, and testicular cancers. This medicine may be used for other purposes; ask your health care provider or pharmacist if you have questions. COMMON BRAND NAME(S): Platinol, Platinol -AQ What should I tell my health care provider before I take this medicine? They need to know if you have any of these conditions: -blood disorders -hearing problems -kidney disease -recent or ongoing  radiation therapy -an unusual or allergic reaction to cisplatin, carboplatin, other chemotherapy, other medicines, foods, dyes, or preservatives -pregnant or trying to get pregnant -breast-feeding How should I use this medicine? This drug is given as an infusion into a vein. It is administered in a hospital or clinic by a specially trained health care professional. Talk to your pediatrician regarding the use of this medicine in children. Special care may be needed. Overdosage: If you  think you have taken too much of this medicine contact a poison control center or emergency room at once. NOTE: This medicine is only for you. Do not share this medicine with others. What if I miss a dose? It is important not to miss a dose. Call your doctor or health care professional if you are unable to keep an appointment. What may interact with this medicine? -dofetilide -foscarnet -medicines for seizures -medicines to increase blood counts like filgrastim, pegfilgrastim, sargramostim -probenecid -pyridoxine used with altretamine -rituximab -some antibiotics like amikacin, gentamicin, neomycin, polymyxin B, streptomycin, tobramycin -sulfinpyrazone -vaccines -zalcitabine Talk to your doctor or health care professional before taking any of these medicines: -acetaminophen -aspirin -ibuprofen -ketoprofen -naproxen This list may not describe all possible interactions. Give your health care provider a list of all the medicines, herbs, non-prescription drugs, or dietary supplements you use. Also tell them if you smoke, drink alcohol, or use illegal drugs. Some items may interact with your medicine. What should I watch for while using this medicine? Your condition will be monitored carefully while you are receiving this medicine. You will need important blood work done while you are taking this medicine. This drug may make you feel generally unwell. This is not uncommon, as chemotherapy can affect healthy cells as well as cancer cells. Report any side effects. Continue your course of treatment even though you feel ill unless your doctor tells you to stop. In some cases, you may be given additional medicines to help with side effects. Follow all directions for their use. Call your doctor or health care professional for advice if you get a fever, chills or sore throat, or other symptoms of a cold or flu. Do not treat yourself. This drug decreases your body's ability to fight infections. Try to  avoid being around people who are sick. This medicine may increase your risk to bruise or bleed. Call your doctor or health care professional if you notice any unusual bleeding. Be careful brushing and flossing your teeth or using a toothpick because you may get an infection or bleed more easily. If you have any dental work done, tell your dentist you are receiving this medicine. Avoid taking products that contain aspirin, acetaminophen, ibuprofen, naproxen, or ketoprofen unless instructed by your doctor. These medicines may hide a fever. Do not become pregnant while taking this medicine. Women should inform their doctor if they wish to become pregnant or think they might be pregnant. There is a potential for serious side effects to an unborn child. Talk to your health care professional or pharmacist for more information. Do not breast-feed an infant while taking this medicine. Drink fluids as directed while you are taking this medicine. This will help protect your kidneys. Call your doctor or health care professional if you get diarrhea. Do not treat yourself. What side effects may I notice from receiving this medicine? Side effects that you should report to your doctor or health care professional as soon as possible: -allergic reactions like skin rash, itching or hives, swelling of the face, lips, or  tongue -signs of infection - fever or chills, cough, sore throat, pain or difficulty passing urine -signs of decreased platelets or bleeding - bruising, pinpoint red spots on the skin, black, tarry stools, nosebleeds -signs of decreased red blood cells - unusually weak or tired, fainting spells, lightheadedness -breathing problems -changes in hearing -gout pain -low blood counts - This drug may decrease the number of white blood cells, red blood cells and platelets. You may be at increased risk for infections and bleeding. -nausea and vomiting -pain, swelling, redness or irritation at the injection  site -pain, tingling, numbness in the hands or feet -problems with balance, movement -trouble passing urine or change in the amount of urine Side effects that usually do not require medical attention (report to your doctor or health care professional if they continue or are bothersome): -changes in vision -loss of appetite -metallic taste in the mouth or changes in taste This list may not describe all possible side effects. Call your doctor for medical advice about side effects. You may report side effects to FDA at 1-800-FDA-1088. Where should I keep my medicine? This drug is given in a hospital or clinic and will not be stored at home. NOTE: This sheet is a summary. It may not cover all possible information. If you have questions about this medicine, talk to your doctor, pharmacist, or health care provider.  2018 Elsevier/Gold Standard (2007-04-05 14:40:54)

## 2016-08-12 NOTE — Progress Notes (Signed)
1600 pt vomited ~ 250 mls mucous with bright red blood. States his stomach is burning. Dr Julien Nordmann notified and Ativan 0.5mg  given.

## 2016-08-12 NOTE — Progress Notes (Signed)
Dr. Julien Nordmann advised ok to proceed with chemo with current urine output of 150 mL.

## 2016-08-12 NOTE — Progress Notes (Signed)
Patient given ativan 0.5mg  at 1615. At completion of cisplatin post hydration fluids patient still appeared to be drowsy. Informed patient to stay here until ativan has worn off and he is able to safely drive home. Pt verbalized understanding.  1805 patient alert and oriented and vital signs stable. Pt states he no longer feels drowsy and feels comfortable to drive home. Pt educated that if drowsiness occurs while driving to pull over until drowsiness subsides. Pt verbalized understanding. Pt stable upon discharge.

## 2016-08-13 ENCOUNTER — Ambulatory Visit: Payer: Medicaid Other | Admitting: Nutrition

## 2016-08-13 ENCOUNTER — Ambulatory Visit (HOSPITAL_COMMUNITY)
Admission: RE | Admit: 2016-08-13 | Discharge: 2016-08-13 | Disposition: A | Discharge status: Home / Self Care | Payer: Medicaid Other | Source: Ambulatory Visit | Attending: Internal Medicine | Admitting: Internal Medicine

## 2016-08-13 ENCOUNTER — Telehealth: Payer: Self-pay | Admitting: Pharmacist

## 2016-08-13 ENCOUNTER — Ambulatory Visit (HOSPITAL_BASED_OUTPATIENT_CLINIC_OR_DEPARTMENT_OTHER): Payer: Medicaid Other

## 2016-08-13 ENCOUNTER — Telehealth: Payer: Self-pay

## 2016-08-13 ENCOUNTER — Ambulatory Visit
Admission: RE | Admit: 2016-08-13 | Discharge: 2016-08-13 | Disposition: A | Discharge status: Home / Self Care | Payer: Medicaid Other | Source: Ambulatory Visit | Attending: Radiation Oncology | Admitting: Radiation Oncology

## 2016-08-13 VITALS — BP 129/75 | HR 64 | Temp 97.9°F | Resp 17

## 2016-08-13 DIAGNOSIS — Z5111 Encounter for antineoplastic chemotherapy: Secondary | ICD-10-CM | POA: Diagnosis not present

## 2016-08-13 DIAGNOSIS — C3412 Malignant neoplasm of upper lobe, left bronchus or lung: Secondary | ICD-10-CM | POA: Diagnosis not present

## 2016-08-13 DIAGNOSIS — Z8673 Personal history of transient ischemic attack (TIA), and cerebral infarction without residual deficits: Secondary | ICD-10-CM | POA: Insufficient documentation

## 2016-08-13 DIAGNOSIS — Z7189 Other specified counseling: Secondary | ICD-10-CM | POA: Diagnosis not present

## 2016-08-13 DIAGNOSIS — Z510 Encounter for antineoplastic radiation therapy: Secondary | ICD-10-CM | POA: Diagnosis not present

## 2016-08-13 MED ORDER — SODIUM CHLORIDE 0.9 % IV SOLN
Freq: Once | INTRAVENOUS | Status: AC
  Administered 2016-08-13: 11:00:00 via INTRAVENOUS

## 2016-08-13 MED ORDER — DEXAMETHASONE SODIUM PHOSPHATE 10 MG/ML IJ SOLN
10.0000 mg | Freq: Once | INTRAMUSCULAR | Status: AC
  Administered 2016-08-13: 10 mg via INTRAVENOUS

## 2016-08-13 MED ORDER — ETOPOSIDE CHEMO INJECTION 1 GM/50ML
120.0000 mg/m2 | Freq: Once | INTRAVENOUS | Status: AC
  Administered 2016-08-13: 170 mg via INTRAVENOUS
  Filled 2016-08-13: qty 8.5

## 2016-08-13 MED ORDER — DEXAMETHASONE SODIUM PHOSPHATE 10 MG/ML IJ SOLN
INTRAMUSCULAR | Status: AC
  Filled 2016-08-13: qty 1

## 2016-08-13 MED ORDER — GADOBENATE DIMEGLUMINE 529 MG/ML IV SOLN
10.0000 mL | Freq: Once | INTRAVENOUS | Status: AC | PRN
  Administered 2016-08-13: 9 mL via INTRAVENOUS

## 2016-08-13 NOTE — Patient Instructions (Signed)
[facility] Discharge Instructions for Patients Receiving Chemotherapy  Today you received the following chemotherapy agents:  Etoposide  To help prevent nausea and vomiting after your treatment, we encourage you to take your nausea medication as prescribed.   If you develop nausea and vomiting that is not controlled by your nausea medication, call the clinic.   BELOW ARE SYMPTOMS THAT SHOULD BE REPORTED IMMEDIATELY:  *FEVER GREATER THAN 100.5 F  *CHILLS WITH OR WITHOUT FEVER  NAUSEA AND VOMITING THAT IS NOT CONTROLLED WITH YOUR NAUSEA MEDICATION  *UNUSUAL SHORTNESS OF BREATH  *UNUSUAL BRUISING OR BLEEDING  TENDERNESS IN MOUTH AND THROAT WITH OR WITHOUT PRESENCE OF ULCERS  *URINARY PROBLEMS  *BOWEL PROBLEMS  UNUSUAL RASH Items with * indicate a potential emergency and should be followed up as soon as possible.  Feel free to call the clinic you have any questions or concerns. The clinic phone number is [phone].  Please show the [place] at check-in to the Emergency Department and triage nurse.

## 2016-08-13 NOTE — Telephone Encounter (Signed)
Pt scheduled for 0900 infusion.  This RN has called listed number for pt but no answer or voice mail to leave msg.  Pt called for out in lobby but does not appear to be in lobby as of [phone]

## 2016-08-13 NOTE — Telephone Encounter (Signed)
The following drug interactions were noted with patient's chemotherapy regimen (etoposide and cisplatin) and phenytoin:  - Cisplatin may decrease phenytoin levels - Phenytoin may decrease etoposide and Emend levels  Patient's primary provider is aware of interaction and plans to monitor phenytoin levels more frequently as indicated. Patient's medical oncologist was also made aware. Pharmacy educated patient about interactions and to call his provider immediately if he has any dizziness or changes suggestive of seizures.   Demetrius Charity, PharmD PGY2 Oncology Pharmacy Resident  Pharmacy Phone: [phone] 08/13/2016

## 2016-08-13 NOTE — Progress Notes (Signed)
56 year old male diagnosed with small cell lung cancer in July 2018 and is a patient of Dr. Julien Nordmann.  Past medical history includes seizure disorder, chronic pancreatitis, anxiety, CAD, GERD, hiatal hernia, protein calorie malnutrition, tobacco, and hypertension.  Medications include Creon, Cymbalta, ferrous sulfate, Remeron, Protonix, Compazine, Phenergan and Zantac.  Labs include albumin 3.3.  Height: 66 inches. Weight: 103 pounds on July 31. Usual body weight: 106 pounds in January 2018.  Patient weighed 148 pounds in 2013. BMI: 16.62. (underweight)  I spoke with patient during his chemotherapy. He reports he had discontinued his tube feeding secondary to a pest situation in his home.  He now has a new supply of tube feedings and has resumed vital 1.2 at 85 mL an hour for 12 hours daily. Patient denies problems with tolerance. He is eating some throughout the day. Patient meets criteria for severe malnutrition in the context of chronic illness.  Estimated nutrition needs: 1600-1800 calories, 80-95 grams protein, 1.8 L fluid.  Nutrition diagnosis:  Severe malnutrition related to 30% weight loss from usual body weight, severe depletion of body fat and muscle mass on physical exam.  Intervention: I educated patient to continue oral intake during the day as tolerated. Educated patient to increase vital 1.2-95 mL an hour over 14 hours to provide 1562 cal, 98 g protein, 1056 mL free water. I will evaluate need to decrease tube feeding as oral intake is able to increase. Encouraged patient to contact me with questions or concerns.  Monitoring, evaluation, goals: Patient will tolerate oral intake plus tube feeding to meet greater than 90% of estimated nutrition needs.  Next visit: Tuesday, August 21 during infusion.  **Disclaimer: This note was dictated with voice recognition software. Similar sounding words can inadvertently be transcribed and this note may contain transcription errors  which may not have been corrected upon publication of note.**

## 2016-08-13 NOTE — Progress Notes (Signed)
While asking pt how he was feeling from his episodes of vomiting yesterday. Pt stated:  "think I know why I was throwing up yesterday, my sister came up here and had an open can of coke with liquor in it, and told me to take a big swallow. I didn't know what was in it, until after I drank it. " Pt was educated on the effects of alcohol and his safety. Pt was also educated on the infusion room policies.

## 2016-08-13 NOTE — Progress Notes (Signed)
Pt c/o suddenly feeling cold.  No shivering or rigors noted.  VSS.  Will continue to monitor.

## 2016-08-13 NOTE — Progress Notes (Signed)
RN reports pt had 9 venipunctures for MRI today . Pt requests port .

## 2016-08-14 ENCOUNTER — Ambulatory Visit: Payer: Medicaid Other

## 2016-08-14 ENCOUNTER — Telehealth: Payer: Self-pay

## 2016-08-14 NOTE — Telephone Encounter (Signed)
Called patient to determine reason for no-show. States, "I just didn't feel like I could make it today." Patient would not elaborate. Educated patient on the importance of coming to all scheduled treatments. Verbalized understanding.

## 2016-08-14 NOTE — Telephone Encounter (Signed)
Called patient to determine

## 2016-08-16 LAB — PAIN MGMT, PROFILE 8 W/CONF, U
6 Acetylmorphine: NEGATIVE ng/mL (ref <10)
Alcohol Metabolites: NEGATIVE ng/mL (ref <500)
Amphetamines: NEGATIVE ng/mL (ref <500)
BENZODIAZEPINES: NEGATIVE ng/mL (ref <100)
BUPRENORPHINE: NEGATIVE ng/mL (ref <5)
COCAINE METABOLITE: NEGATIVE ng/mL (ref <150)
CREATININE: 99.5 mg/dL (ref >=20.0)
Codeine: NEGATIVE ng/mL (ref <50)
Hydrocodone: 1358 ng/mL — ABNORMAL HIGH (ref <50)
Hydromorphone: 286 ng/mL — ABNORMAL HIGH (ref <50)
MARIJUANA METABOLITE: NEGATIVE ng/mL (ref <20)
MDMA: NEGATIVE ng/mL (ref <500)
MORPHINE: NEGATIVE ng/mL (ref <50)
NORHYDROCODONE: 3463 ng/mL — AB (ref <50)
Opiates: POSITIVE ng/mL — AB (ref <100)
Oxidant: NEGATIVE ug/mL (ref <200)
Oxycodone: NEGATIVE ng/mL (ref <100)
Please note:: 0
pH: 5.96 (ref 4.5–9.0)

## 2016-08-17 ENCOUNTER — Ambulatory Visit: Payer: Medicaid Other

## 2016-08-18 ENCOUNTER — Other Ambulatory Visit: Payer: Medicaid Other

## 2016-08-18 ENCOUNTER — Ambulatory Visit
Admission: RE | Admit: 2016-08-18 | Discharge: 2016-08-18 | Disposition: A | Discharge status: Home / Self Care | Payer: Medicaid Other | Source: Ambulatory Visit | Attending: Radiation Oncology | Admitting: Radiation Oncology

## 2016-08-18 ENCOUNTER — Ambulatory Visit: Payer: Medicaid Other | Admitting: Internal Medicine

## 2016-08-18 ENCOUNTER — Other Ambulatory Visit: Payer: Self-pay | Admitting: Medical Oncology

## 2016-08-18 DIAGNOSIS — I878 Other specified disorders of veins: Secondary | ICD-10-CM

## 2016-08-18 DIAGNOSIS — Z510 Encounter for antineoplastic radiation therapy: Secondary | ICD-10-CM | POA: Diagnosis not present

## 2016-08-19 ENCOUNTER — Ambulatory Visit
Admission: RE | Admit: 2016-08-19 | Discharge: 2016-08-19 | Disposition: A | Discharge status: Home / Self Care | Payer: Medicaid Other | Source: Ambulatory Visit | Attending: Radiation Oncology | Admitting: Radiation Oncology

## 2016-08-19 DIAGNOSIS — Z510 Encounter for antineoplastic radiation therapy: Secondary | ICD-10-CM | POA: Diagnosis not present

## 2016-08-20 ENCOUNTER — Telehealth: Payer: Self-pay | Admitting: Oncology

## 2016-08-20 ENCOUNTER — Other Ambulatory Visit: Payer: Self-pay | Admitting: Family Medicine

## 2016-08-20 ENCOUNTER — Telehealth: Payer: Self-pay

## 2016-08-20 ENCOUNTER — Ambulatory Visit
Admission: RE | Admit: 2016-08-20 | Discharge: 2016-08-20 | Disposition: A | Discharge status: Home / Self Care | Payer: Medicaid Other | Source: Ambulatory Visit | Attending: Radiation Oncology | Admitting: Radiation Oncology

## 2016-08-20 DIAGNOSIS — Z510 Encounter for antineoplastic radiation therapy: Secondary | ICD-10-CM | POA: Diagnosis not present

## 2016-08-20 DIAGNOSIS — G629 Polyneuropathy, unspecified: Secondary | ICD-10-CM

## 2016-08-20 NOTE — Telephone Encounter (Signed)
Pt called for results of Brain MRI done 8/2

## 2016-08-20 NOTE — Telephone Encounter (Signed)
Called patient and advised him of the good results of his brain MRI per Dr. Sondra Come.  He verbalized understanding.

## 2016-08-21 ENCOUNTER — Ambulatory Visit
Admission: RE | Admit: 2016-08-21 | Discharge: 2016-08-21 | Disposition: A | Discharge status: Home / Self Care | Payer: Medicaid Other | Source: Ambulatory Visit | Attending: Radiation Oncology | Admitting: Radiation Oncology

## 2016-08-21 DIAGNOSIS — Z510 Encounter for antineoplastic radiation therapy: Secondary | ICD-10-CM | POA: Diagnosis not present

## 2016-08-21 MED ORDER — GADOBENATE DIMEGLUMINE 529 MG/ML IV SOLN
10.0000 mL | Freq: Once | INTRAVENOUS | Status: AC | PRN
  Administered 2016-08-13: 10 mL via INTRAVENOUS

## 2016-08-24 ENCOUNTER — Ambulatory Visit
Admission: RE | Admit: 2016-08-24 | Discharge: 2016-08-24 | Disposition: A | Discharge status: Home / Self Care | Payer: Medicaid Other | Source: Ambulatory Visit | Attending: Radiation Oncology | Admitting: Radiation Oncology

## 2016-08-24 ENCOUNTER — Telehealth: Payer: Self-pay

## 2016-08-24 DIAGNOSIS — Z510 Encounter for antineoplastic radiation therapy: Secondary | ICD-10-CM | POA: Diagnosis not present

## 2016-08-25 ENCOUNTER — Other Ambulatory Visit (HOSPITAL_BASED_OUTPATIENT_CLINIC_OR_DEPARTMENT_OTHER): Payer: Medicaid Other

## 2016-08-25 ENCOUNTER — Ambulatory Visit
Admission: RE | Admit: 2016-08-25 | Discharge: 2016-08-25 | Disposition: A | Discharge status: Home / Self Care | Payer: Medicaid Other | Source: Ambulatory Visit | Attending: Radiation Oncology | Admitting: Radiation Oncology

## 2016-08-25 ENCOUNTER — Telehealth: Payer: Self-pay

## 2016-08-25 ENCOUNTER — Other Ambulatory Visit: Payer: Self-pay | Admitting: Internal Medicine

## 2016-08-25 ENCOUNTER — Telehealth: Payer: Self-pay | Admitting: Oncology

## 2016-08-25 ENCOUNTER — Other Ambulatory Visit: Payer: Self-pay | Admitting: Radiation Oncology

## 2016-08-25 DIAGNOSIS — C3412 Malignant neoplasm of upper lobe, left bronchus or lung: Secondary | ICD-10-CM | POA: Diagnosis present

## 2016-08-25 DIAGNOSIS — R52 Pain, unspecified: Secondary | ICD-10-CM

## 2016-08-25 DIAGNOSIS — Z510 Encounter for antineoplastic radiation therapy: Secondary | ICD-10-CM | POA: Diagnosis not present

## 2016-08-25 DIAGNOSIS — Z5111 Encounter for antineoplastic chemotherapy: Secondary | ICD-10-CM

## 2016-08-25 DIAGNOSIS — Z7189 Other specified counseling: Secondary | ICD-10-CM

## 2016-08-25 DIAGNOSIS — Z79891 Long term (current) use of opiate analgesic: Secondary | ICD-10-CM

## 2016-08-25 LAB — CBC WITH DIFFERENTIAL/PLATELET
BASO%: 0.8 % (ref 0.0–2.0)
BASOS ABS: 0 10*3/uL (ref 0.0–0.1)
EOS%: 2.9 % (ref 0.0–7.0)
Eosinophils Absolute: 0.2 10*3/uL (ref 0.0–0.5)
HEMATOCRIT: 32.4 % — AB (ref 38.4–49.9)
HEMOGLOBIN: 11.1 g/dL — AB (ref 13.0–17.1)
LYMPH#: 1 10*3/uL (ref 0.9–3.3)
LYMPH%: 18.7 % (ref 14.0–49.0)
MCH: 32.4 pg (ref 27.2–33.4)
MCHC: 34.3 g/dL (ref 32.0–36.0)
MCV: 94.6 fL (ref 79.3–98.0)
MONO#: 0.5 10*3/uL (ref 0.1–0.9)
MONO%: 8.6 % (ref 0.0–14.0)
NEUT#: 3.8 10*3/uL (ref 1.5–6.5)
NEUT%: 69 % (ref 39.0–75.0)
PLATELETS: 138 10*3/uL — AB (ref 140–400)
RBC: 3.43 10*6/uL — ABNORMAL LOW (ref 4.20–5.82)
RDW: 14.4 % (ref 11.0–14.6)
WBC: 5.5 10*3/uL (ref 4.0–10.3)

## 2016-08-25 LAB — COMPREHENSIVE METABOLIC PANEL
ALBUMIN: 3.3 g/dL — AB (ref 3.5–5.0)
ALK PHOS: 124 U/L (ref 40–150)
ALT: 13 U/L (ref 0–55)
ANION GAP: 8 meq/L (ref 3–11)
AST: 16 U/L (ref 5–34)
BUN: 9.6 mg/dL (ref 7.0–26.0)
CALCIUM: 8.9 mg/dL (ref 8.4–10.4)
CHLORIDE: 108 meq/L (ref 98–109)
CO2: 25 mEq/L (ref 22–29)
CREATININE: 0.7 mg/dL (ref 0.7–1.3)
EGFR: >90 mL/min/{1.73_m2} (ref >90)
Glucose: 89 mg/dl (ref 70–140)
POTASSIUM: 3.6 meq/L (ref 3.5–5.1)
Sodium: 141 mEq/L (ref 136–145)
Total Bilirubin: 0.24 mg/dL (ref 0.20–1.20)
Total Protein: 6.8 g/dL (ref 6.4–8.3)

## 2016-08-25 LAB — MAGNESIUM: Magnesium: 1.8 mg/dl (ref 1.5–2.5)

## 2016-08-25 MED ORDER — SUCRALFATE 1 GM/10ML PO SUSP: 1.0000 g | Freq: Three times a day (TID) | ORAL | 0 refills | Status: AC

## 2016-08-25 MED ORDER — OXYCODONE HCL 10 MG PO TABS: 10.0000 mg | ORAL_TABLET | Freq: Four times a day (QID) | ORAL | 0 refills | Status: DC | PRN

## 2016-08-25 NOTE — Telephone Encounter (Addendum)
Received a call from [place] at [facility].  Per patient's request, prescription for Carafate has been transferred to [facility] pharmacy.

## 2016-08-25 NOTE — Telephone Encounter (Signed)
Left a message for patient advising him that a prescription for nicotine patches was sent to his pharmacy by his PCP on 08/12/16.

## 2016-08-26 ENCOUNTER — Ambulatory Visit
Admission: RE | Admit: 2016-08-26 | Discharge: 2016-08-26 | Disposition: A | Discharge status: Home / Self Care | Payer: Medicaid Other | Source: Ambulatory Visit | Attending: Radiation Oncology | Admitting: Radiation Oncology

## 2016-08-26 DIAGNOSIS — Z510 Encounter for antineoplastic radiation therapy: Secondary | ICD-10-CM | POA: Diagnosis not present

## 2016-08-26 NOTE — Telephone Encounter (Addendum)
Per Doren Custard at [facility], Dr. Sondra Come is not currently a provider for medicaid.  Called back and changed the prescription to Shona Simpson, [place].

## 2016-08-27 ENCOUNTER — Other Ambulatory Visit: Payer: Self-pay | Admitting: Radiology

## 2016-08-27 ENCOUNTER — Other Ambulatory Visit: Payer: Self-pay | Admitting: Family Medicine

## 2016-08-27 ENCOUNTER — Ambulatory Visit
Admission: RE | Admit: 2016-08-27 | Discharge: 2016-08-27 | Disposition: A | Discharge status: Home / Self Care | Payer: Medicaid Other | Source: Ambulatory Visit | Attending: Radiation Oncology | Admitting: Radiation Oncology

## 2016-08-27 DIAGNOSIS — F17200 Nicotine dependence, unspecified, uncomplicated: Secondary | ICD-10-CM

## 2016-08-27 DIAGNOSIS — Z510 Encounter for antineoplastic radiation therapy: Secondary | ICD-10-CM | POA: Diagnosis not present

## 2016-08-27 MED ORDER — NICOTINE 14 MG/24HR TD PT24: 14.0000 mg | MEDICATED_PATCH | Freq: Every day | TRANSDERMAL | 2 refills | Status: AC

## 2016-08-28 ENCOUNTER — Other Ambulatory Visit: Payer: Self-pay | Admitting: Radiology

## 2016-08-28 ENCOUNTER — Ambulatory Visit: Payer: Medicaid Other

## 2016-08-31 ENCOUNTER — Other Ambulatory Visit: Payer: Self-pay | Admitting: Internal Medicine

## 2016-08-31 ENCOUNTER — Encounter (HOSPITAL_COMMUNITY): Payer: Self-pay

## 2016-08-31 ENCOUNTER — Ambulatory Visit
Admission: RE | Admit: 2016-08-31 | Discharge: 2016-08-31 | Disposition: A | Discharge status: Home / Self Care | Payer: Medicaid Other | Source: Ambulatory Visit | Attending: Radiation Oncology | Admitting: Radiation Oncology

## 2016-08-31 ENCOUNTER — Ambulatory Visit (HOSPITAL_COMMUNITY)
Admission: RE | Admit: 2016-08-31 | Discharge: 2016-08-31 | Disposition: A | Discharge status: Home / Self Care | Payer: Medicaid Other | Source: Ambulatory Visit | Attending: Internal Medicine | Admitting: Internal Medicine

## 2016-08-31 DIAGNOSIS — I10 Essential (primary) hypertension: Secondary | ICD-10-CM | POA: Diagnosis not present

## 2016-08-31 DIAGNOSIS — J449 Chronic obstructive pulmonary disease, unspecified: Secondary | ICD-10-CM | POA: Insufficient documentation

## 2016-08-31 DIAGNOSIS — F17210 Nicotine dependence, cigarettes, uncomplicated: Secondary | ICD-10-CM | POA: Diagnosis not present

## 2016-08-31 DIAGNOSIS — G894 Chronic pain syndrome: Secondary | ICD-10-CM | POA: Diagnosis not present

## 2016-08-31 DIAGNOSIS — G40909 Epilepsy, unspecified, not intractable, without status epilepticus: Secondary | ICD-10-CM | POA: Diagnosis not present

## 2016-08-31 DIAGNOSIS — K219 Gastro-esophageal reflux disease without esophagitis: Secondary | ICD-10-CM | POA: Diagnosis not present

## 2016-08-31 DIAGNOSIS — I878 Other specified disorders of veins: Secondary | ICD-10-CM

## 2016-08-31 DIAGNOSIS — Z955 Presence of coronary angioplasty implant and graft: Secondary | ICD-10-CM | POA: Diagnosis not present

## 2016-08-31 DIAGNOSIS — I2510 Atherosclerotic heart disease of native coronary artery without angina pectoris: Secondary | ICD-10-CM | POA: Insufficient documentation

## 2016-08-31 DIAGNOSIS — F79 Unspecified intellectual disabilities: Secondary | ICD-10-CM | POA: Diagnosis not present

## 2016-08-31 DIAGNOSIS — F419 Anxiety disorder, unspecified: Secondary | ICD-10-CM | POA: Insufficient documentation

## 2016-08-31 DIAGNOSIS — Z79899 Other long term (current) drug therapy: Secondary | ICD-10-CM | POA: Insufficient documentation

## 2016-08-31 DIAGNOSIS — C3412 Malignant neoplasm of upper lobe, left bronchus or lung: Secondary | ICD-10-CM | POA: Diagnosis not present

## 2016-08-31 DIAGNOSIS — Z510 Encounter for antineoplastic radiation therapy: Secondary | ICD-10-CM | POA: Diagnosis not present

## 2016-08-31 DIAGNOSIS — Z86718 Personal history of other venous thrombosis and embolism: Secondary | ICD-10-CM | POA: Diagnosis not present

## 2016-08-31 HISTORY — PX: IR FLUORO GUIDE PORT INSERTION RIGHT: IMG5741

## 2016-08-31 HISTORY — PX: IR US GUIDE VASC ACCESS RIGHT: IMG2390

## 2016-08-31 LAB — CBC WITH DIFFERENTIAL/PLATELET
BASOS ABS: 0 10*3/uL (ref 0.0–0.1)
BASOS PCT: 1 %
EOS ABS: 0.1 10*3/uL (ref 0.0–0.7)
Eosinophils Relative: 4 %
HEMATOCRIT: 30.5 % — AB (ref 39.0–52.0)
HEMOGLOBIN: 10.5 g/dL — AB (ref 13.0–17.0)
Lymphocytes Relative: 31 %
Lymphs Abs: 1.1 10*3/uL (ref 0.7–4.0)
MCH: 31.3 pg (ref 26.0–34.0)
MCHC: 34.4 g/dL (ref 30.0–36.0)
MCV: 90.8 fL (ref 78.0–100.0)
Monocytes Absolute: 0.6 10*3/uL (ref 0.1–1.0)
Monocytes Relative: 17 %
NEUTROS ABS: 1.6 10*3/uL — AB (ref 1.7–7.7)
NEUTROS PCT: 47 %
Platelets: 175 10*3/uL (ref 150–400)
RBC: 3.36 MIL/uL — ABNORMAL LOW (ref 4.22–5.81)
RDW: 14.7 % (ref 11.5–15.5)
WBC: 3.5 10*3/uL — AB (ref 4.0–10.5)

## 2016-08-31 LAB — PROTIME-INR
INR: 0.99
PROTHROMBIN TIME: 13.1 s (ref 11.4–15.2)

## 2016-08-31 MED ORDER — HEPARIN SOD (PORK) LOCK FLUSH 100 UNIT/ML IV SOLN
INTRAVENOUS | Status: AC
  Filled 2016-08-31: qty 5

## 2016-08-31 MED ORDER — HEPARIN SOD (PORK) LOCK FLUSH 100 UNIT/ML IV SOLN
INTRAVENOUS | Status: AC | PRN
  Administered 2016-08-31: 500 [IU] via INTRAVENOUS

## 2016-08-31 MED ORDER — LIDOCAINE-EPINEPHRINE (PF) 2 %-1:200000 IJ SOLN
INTRAMUSCULAR | Status: AC | PRN
  Administered 2016-08-31: 20 mL via INTRADERMAL

## 2016-08-31 MED ORDER — LIDOCAINE-EPINEPHRINE (PF) 2 %-1:200000 IJ SOLN
INTRAMUSCULAR | Status: AC
  Filled 2016-08-31: qty 20

## 2016-08-31 MED ORDER — MIDAZOLAM HCL 2 MG/2ML IJ SOLN
INTRAMUSCULAR | Status: AC | PRN
  Administered 2016-08-31 (×2): 1 mg via INTRAVENOUS

## 2016-08-31 MED ORDER — FENTANYL CITRATE (PF) 100 MCG/2ML IJ SOLN
INTRAMUSCULAR | Status: AC
  Filled 2016-08-31: qty 4

## 2016-08-31 MED ORDER — MIDAZOLAM HCL 2 MG/2ML IJ SOLN
INTRAMUSCULAR | Status: AC
  Filled 2016-08-31: qty 4

## 2016-08-31 MED ORDER — CEFAZOLIN SODIUM-DEXTROSE 2-4 GM/100ML-% IV SOLN
2.0000 g | INTRAVENOUS | Status: AC
  Administered 2016-08-31: 2 g via INTRAVENOUS

## 2016-08-31 MED ORDER — SODIUM CHLORIDE 0.9 % IV SOLN
INTRAVENOUS | Status: DC
  Administered 2016-08-31: 08:00:00 via INTRAVENOUS

## 2016-08-31 MED ORDER — CEFAZOLIN SODIUM-DEXTROSE 2-4 GM/100ML-% IV SOLN
INTRAVENOUS | Status: AC
  Administered 2016-08-31: 2 g via INTRAVENOUS
  Filled 2016-08-31: qty 100

## 2016-08-31 MED ORDER — FENTANYL CITRATE (PF) 100 MCG/2ML IJ SOLN
INTRAMUSCULAR | Status: AC | PRN
  Administered 2016-08-31 (×2): 50 ug via INTRAVENOUS

## 2016-08-31 NOTE — H&P (Signed)
Chief Complaint: Patient was seen in consultation today for small cell lung cancer  Referring Physician(s): Mohamed,Mohamed  Supervising Physician: Markus Daft  Patient Status: [facility] - Out-pt  History of Present Illness: [facility] is a 56 y.o. male with past medical history of seizure disorder and chronic pain who presents with recent diagnosis of small cell lung cancer.   PET 08/14/62: Hypermetabolic 2.2 cm medial left upper lobe nodule, increased from prior CT, compatible with primary bronchogenic neoplasm. Associated AP window nodal metastasis, progressed from recent CT. Suspected left perihilar nodal metastasis. No evidence of metastatic disease in the neck, abdomen, or pelvis.  IR consulted for Port-A-Cath placement at the request of Dr. Julien Nordmann.  Patient states he has had prior Ports placed on the right side.  His most recent Port was removed sometime in 2017 per his report.   He presents today in his usual state of health.  He has been NPO.  He does not take blood thinners.   Past Medical History:  Diagnosis Date  . Anxiety states 08/04/2010  . Chronic airway obstruction, not elsewhere classified 08/04/2010  . Chronic pain syndrome 08/04/2010  . Chronic pancreatitis ([place]) 08/04/2010  . Coronary artery disease   . GERD (gastroesophageal reflux disease)   . Hiatal hernia    EGD 01/18/13  . Insomnia 08/04/2010  . Intellectual disability   . Lung nodule 05/20/2016  . Other abnormal glucose 08/04/2010  . Pancreatitis chronic 08/04/2010  . Protein calorie malnutrition ([place]) 2010  . Seizure ([place]) 08/04/2010  . Severe anemia 05/20/2016  . 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 Splenic vein thrombosis    Chronic.  . Tobacco use disorder 08/04/2010  . Unspecified essential hypertension 08/04/2010    Past Surgical History:  Procedure Laterality Date  . COLONOSCOPY WITH PROPOFOL N/A 06/26/2016   Procedure: COLONOSCOPY WITH PROPOFOL;  Surgeon:  Rogene Houston, MD;  Location[facility];  Service: Endoscopy;  Laterality: N/A;  10:30  . CORONARY ANGIOPLASTY WITH STENT PLACEMENT    . ESOPHAGOGASTRODUODENOSCOPY N/A 01/18/2013   Procedure: ESOPHAGOGASTRODUODENOSCOPY (EGD);  Surgeon: Rogene Houston, MD;  Location: [facility];  Service: Endoscopy;  Laterality: N/A;  . EUS  03/26/2009   [facility]  . GASTROSTOMY-JEJEUNOSTOMY TUBE CHANGE/PLACEMENT  12/11/2008   [facility]  . I&D EXTREMITY Left 09/12/2012   Procedure: IRRIGATION AND DEBRIDEMENT LEFT FIFTH FINGER;  Surgeon: Roseanne Kaufman, MD;  Location: [facility];  Service: Orthopedics;  Laterality: Left;  . JEJUNOSTOMY FEEDING TUBE  07/13/2010  . NERVE AND TENDON REPAIR Left 09/12/2012   Procedure: NERVE AND TENDON REPAIR LEFT FIFTH FINGER;  Surgeon: Roseanne Kaufman, MD;  Location: [facility];  Service: Orthopedics;  Laterality: Left;  . OPEN REDUCTION INTERNAL FIXATION (ORIF) METACARPAL Left 09/12/2012   Procedure: OPEN REDUCTION INTERNAL FIXATION LEFT FIFTH FINGER;  Surgeon: Roseanne Kaufman, MD;  Location: [facility];  Service: Orthopedics;  Laterality: Left;  . PANCREATIC PSEUDOCYST DRAINAGE  07/13/2010  . PORTACATH PLACEMENT  11/28/2008   [facility]  . VIDEO BRONCHOSCOPY WITH ENDOBRONCHIAL ULTRASOUND N/A 07/24/2016   Procedure: VIDEO BRONCHOSCOPY WITH ENDOBRONCHIAL ULTRASOUND;  Surgeon: Grace Isaac, MD;  Location: [facility] OR;  Service: Thoracic;  Laterality: N/A;    Allergies: Latex  Medications: Prior to Admission medications   Medication Sig Start Date End Date Taking? Authorizing Provider  albuterol (PROVENTIL HFA;VENTOLIN HFA) 108 (90 BASE) MCG/ACT inhaler Inhale 2 puffs into the lungs every 6 (six) hours as needed for  wheezing or shortness of breath.    Yes [provider]  amLODipine (NORVASC) 10 MG tablet TAKE ONE (1) TABLET BY MOUTH EVERY DAY 08/20/16  Yes [name], FNP  CREON [phone] units CPEP TAKE TWO CAPSULES BY MOUTH BEFORE MEALS 08/20/16  Yes [name],  FNP  DULoxetine (CYMBALTA) 30 MG capsule Take 1 capsule (30 mg total) by mouth 2 (two) times daily. 07/28/16  Yes [name], FNP  ferrous sulfate 325 (65 FE) MG tablet TAKE ONE (1) TABLET BY MOUTH EVERY DAY WITH BREAKFAST Patient taking differently: Take 325 mg by mouth daily with breakfast.  06/19/16  Yes [name], FNP  gabapentin (NEURONTIN) 300 MG capsule TAKE ONE (1) CAPSULE BY MOUTH 4 TIMES DAILY AS NEEDED 08/20/16  Yes [name], FNP  hydrocortisone cream 0.5 % Apply 1 application topically 2 (two) times daily. 02/28/16  Yes [name], FNP  mirtazapine (REMERON) 15 MG tablet TAKE ONE TABLET BY MOUTH EVERY EVENING Patient taking differently: TAKE 15 mg BY MOUTH EVERY EVENING 05/14/16  Yes [name], FNP  Nutritional Supplements (FEEDING SUPPLEMENT, VITAL AF 1.2 CAL,) LIQD Place 1,000 mLs into feeding tube daily. At 70ml/hr 05/16/16  Yes Eugenie Filler, MD  ondansetron (ZOFRAN) 4 MG tablet Take 1 tablet (4 mg total) by mouth every 6 (six) hours as needed for nausea. 05/15/16  Yes Eugenie Filler, MD  Oxycodone HCl 10 MG TABS Take 1 tablet (10 mg total) by mouth every 6 (six) hours as needed. 08/25/16  Yes Tresa Garter, MD  pantoprazole (PROTONIX) 40 MG tablet TAKE ONE (1) TABLET BY MOUTH EVERY DAY 08/20/16  Yes [name], FNP  phenytoin (DILANTIN) 100 MG ER capsule Take 2 capsules (200 mg total) by mouth at bedtime. 07/28/16  Yes [name], FNP  prochlorperazine (COMPAZINE) 10 MG tablet Take 1 tablet (10 mg total) by mouth every 6 (six) hours as needed for nausea or vomiting. 07/30/16  Yes Curt Bears, MD  promethazine (PHENERGAN) 25 MG tablet Take 25 mg by mouth every 6 (six) hours as needed for nausea or vomiting.    Yes [provider]  sucralfate (CARAFATE) 1 GM/10ML suspension Take 10 mLs (1 g total) by mouth 4 (four) times daily -  with meals and at bedtime. 08/25/16  Yes Gery Pray, MD  vitamin C (ASCORBIC ACID) 500 MG  tablet Take 1 tablet (500 mg total) by mouth daily. 11/20/15  Yes Micheline Chapman, NP  Adhesive Tape (CLOTH ADHESIVE SURG 1/2"X10YD) TAPE 1 each by Does not apply route 2 (two) times daily as needed. 03/09/16   [name], FNP  Gauze Pads & Dressings (BIOGUARD GAUZE SPONGES) 4"X4" PADS 1 each by Does not apply route 2 (two) times daily. 03/09/16   [name], FNP  nicotine (NICODERM CQ) 14 mg/24hr patch Place 1 patch (14 mg total) onto the skin daily. 08/27/16   [name], FNP  ranitidine (ZANTAC) 150 MG capsule TAKE 1 CAPSULE BY MOUTH TWICE DAILY Patient not taking: Reported on 08/11/2016 06/13/15   Micheline Chapman, NP  Water For Irrigation, Sterile (FREE WATER) SOLN Place 200 mLs into feeding tube every 8 (eight) hours. 05/15/16   Eugenie Filler, MD  Wound Dressings (SONAFINE EX) Apply topically.    [provider]     History reviewed. No pertinent family history.  Social History   Social History  . Marital status: Married    Spouse  name: N/A  . Number of children: N/A  . Years of education: N/A   Social History Main Topics  . Smoking status: Current Every Day Smoker    Packs/day: 0.25    Years: 20.00    Types: Cigarettes  . Smokeless tobacco: Never Used     Comment: 1 pack a day since age 43  . Alcohol use No     Comment: No etoh since 5-6 yrs  . Drug use: No  . Sexual activity: No   Other Topics Concern  . None   Social History Narrative   Intellectual impairment, illiterate.      Review of Systems  Constitutional: Negative for fatigue and fever.  Respiratory: Positive for cough (for the past few months). Negative for shortness of breath.   Cardiovascular: Negative for chest pain.  Gastrointestinal: Negative for abdominal pain.  Genitourinary: Negative for frequency and urgency.  Psychiatric/Behavioral: Negative for behavioral problems and confusion.    Vital Signs: BP 113/66 (BP Location: Right Arm)   Pulse 64   Temp (!) 97.4  F (36.3 C) (Oral)   Resp 18   SpO2 99%   Physical Exam  Constitutional: He is oriented to person, place, and time. He appears well-developed.  Mild subcutaneous fat loss, evidence of muscle wasting   Cardiovascular: Normal rate, regular rhythm and normal heart sounds.   Pulmonary/Chest: Effort normal and breath sounds normal.  Neurological: He is alert and oriented to person, place, and time.  Skin: Skin is warm and dry.  Psychiatric: He has a normal mood and affect. His behavior is normal. Judgment and thought content normal.  Nursing note and vitals reviewed.   Mallampati Score:  MD Evaluation Airway: WNL Heart: WNL Abdomen: WNL Chest/ Lungs: WNL ASA  Classification: 3 Mallampati/Airway Score: Two  Imaging: Mr Jeri Cos TK Contrast  Result Date: 08/13/2016 CLINICAL DATA:  Lung cancer staging. EXAM: MRI HEAD WITHOUT AND WITH CONTRAST TECHNIQUE: Multiplanar, multiecho pulse sequences of the brain and surrounding structures were obtained without and with intravenous contrast. CONTRAST:  9 cc MultiHance intravenous COMPARISON:  06/11/2015 FINDINGS: Brain:  No enhancement or mass effect to suggest metastatic disease. No acute infarction, hemorrhage, hydrocephalus, extra-axial collection or mass lesion. Advanced ischemic injury for age, with small to moderate remote infarcts affecting the right occipital lobe, right frontal parietal cortex, left inferior cerebellum and white matter along the frontal horn of the right lateral ventricle. Chronic microvascular ischemic gliosis in the cerebral white matter. Vascular: Major flow voids are preserved. Skull and upper cervical spine: Negative for marrow lesion Sinuses/Orbits: Negative for mass. Small fluid level in the left maxillary sinus, incidental to the history. IMPRESSION: 1. Negative for metastatic disease. 2. Remote ischemic injury, age advanced. Electronically Signed   By: Monte Fantasia M.D.   On: 08/13/2016 09:06     Labs:  CBC:  Recent Labs  07/30/16 1435 08/12/16 0841 08/25/16 0802 08/31/16 0748  WBC 10.9* 10.5* 5.5 3.5*  HGB 12.1* 11.3* 11.1* 10.5*  HCT 35.5* 33.3* 32.4* 30.5*  PLT 233 184 138* 175    COAGS:  Recent Labs  05/10/16 1329 07/24/16 0735 08/31/16 0748  INR 1.12 1.03 0.99  APTT 34 34  --     BMP:  Recent Labs  05/21/16 0405 06/05/16 0942 07/10/16 1420 07/24/16 0717 07/30/16 1435 08/12/16 0841 08/25/16 0802  NA 137 136 136 141 139 139 141  K 3.3* 3.9 4.5 3.6 3.9 3.6 3.6  CL 110 107 107 110  --   --   --  CO2 24 18* 23 22 23 23 25   GLUCOSE 151* 167* 80 81 97 108 89  BUN 6 8 9  5* 7.2 7.5 9.6  CALCIUM 7.9* 8.1* 8.8 8.9 9.1 9.2 8.9  CREATININE 0.44* 0.52* 0.56* 0.56* 0.7 0.7 0.7  GFRNONAA >60 >89 >89 >60  --   --   --   GFRAA >60 >89 >89 >60  --   --   --     LIVER FUNCTION TESTS:  Recent Labs  07/24/16 0717 07/30/16 1435 08/12/16 0841 08/25/16 0802  BILITOT 0.4 <0.22 0.27 0.24  AST 16 14 12 16   ALT 13* 10 9 13   ALKPHOS 120 139 142 124  PROT 7.4 7.2 7.0 6.8  ALBUMIN 3.5 3.4* 3.3* 3.3*    TUMOR MARKERS: No results for input(s): AFPTM, CEA, CA199, CHROMGRNA in the last 8760 hours.  Assessment and Plan: Patient with past medical history of chronic pancreatitis and chronic pain presents with complaint of cough, small lung cell cancer.  IR consulted for Port-A-Cath placement at the request of Dr. Julien Nordmann. Case reviewed by Dr. Anselm Pancoast who approves patient for procedure.  Patient presents today in their usual state of health.  He has been NPO and is not currently on blood thinners.  Risks and benefits discussed with the patient including, but not limited to bleeding, infection, pneumothorax, or fibrin sheath development and need for additional procedures. All of the patient's questions were answered, patient is agreeable to proceed. Consent signed and in chart.   Thank you for this interesting consult.  I greatly enjoyed meeting [place]  and look forward to participating in their care.  A copy of this report was sent to the requesting provider on this date.  Electronically Signed: Docia Barrier, PA 08/31/2016, 9:48 AM   I spent a total of    25 Minutes in face to face in clinical consultation, greater than 50% of which was counseling/coordinating care for small cell lung cancer.

## 2016-08-31 NOTE — Progress Notes (Signed)
Patient transported from Short Stay to Radiation oncology via wheelchair with no issues noted.   Patients fiance by his side.

## 2016-08-31 NOTE — Procedures (Signed)
Placement of right jugular portacath.  Tip at SVC/RA junction.  Minimal blood loss and no immediate complication.

## 2016-08-31 NOTE — Discharge Instructions (Signed)
Implanted Port Home Guide °An implanted port is a type of central line that is placed under the skin. Central lines are used to provide IV access when treatment or nutrition needs to be given through a person’s veins. Implanted ports are used for long-term IV access. An implanted port may be placed because: °· You need IV medicine that would be irritating to the small veins in your hands or arms. °· You need long-term IV medicines, such as antibiotics. °· You need IV nutrition for a long period. °· You need frequent blood draws for lab tests. °· You need dialysis. ° °Implanted ports are usually placed in the chest area, but they can also be placed in the upper arm, the abdomen, or the leg. An implanted port has two main parts: °· Reservoir. The reservoir is round and will appear as a small, raised area under your skin. The reservoir is the part where a needle is inserted to give medicines or draw blood. °· Catheter. The catheter is a thin, flexible tube that extends from the reservoir. The catheter is placed into a large vein. Medicine that is inserted into the reservoir goes into the catheter and then into the vein. ° °How will I care for my incision site? °Do not get the incision site wet. Bathe or shower as directed by your health care provider. °How is my port accessed? °Special steps must be taken to access the port: °· Before the port is accessed, a numbing cream can be placed on the skin. This helps numb the skin over the port site. °· Your health care provider uses a sterile technique to access the port. °? Your health care provider must put on a mask and sterile gloves. °? The skin over your port is cleaned carefully with an antiseptic and allowed to dry. °? The port is gently pinched between sterile gloves, and a needle is inserted into the port. °· Only "non-coring" port needles should be used to access the port. Once the port is accessed, a blood return should be checked. This helps ensure that the port  is in the vein and is not clogged. °· If your port needs to remain accessed for a constant infusion, a clear (transparent) bandage will be placed over the needle site. The bandage and needle will need to be changed every week, or as directed by your health care provider. °· Keep the bandage covering the needle clean and dry. Do not get it wet. Follow your health care provider’s instructions on how to take a shower or bath while the port is accessed. °· If your port does not need to stay accessed, no bandage is needed over the port. ° °What is flushing? °Flushing helps keep the port from getting clogged. Follow your health care provider’s instructions on how and when to flush the port. Ports are usually flushed with saline solution or a medicine called heparin. The need for flushing will depend on how the port is used. °· If the port is used for intermittent medicines or blood draws, the port will need to be flushed: °? After medicines have been given. °? After blood has been drawn. °? As part of routine maintenance. °· If a constant infusion is running, the port may not need to be flushed. ° °How long will my port stay implanted? °The port can stay in for as long as your health care provider thinks it is needed. When it is time for the port to come out, surgery will be   done to remove it. The procedure is similar to the one performed when the port was put in. When should I seek immediate medical care? When you have an implanted port, you should seek immediate medical care if:  You notice a bad smell coming from the incision site.  You have swelling, redness, or drainage at the incision site.  You have more swelling or pain at the port site or the surrounding area.  You have a fever that is not controlled with medicine.  This information is not intended to replace advice given to you by your health care provider. Make sure you discuss any questions you have with your health care provider. Document  Released: 12/29/2004 Document Revised: 06/06/2015 Document Reviewed: 09/05/2012 Elsevier Interactive Patient Education  2017 [place] Insertion, Care After This sheet gives you information about how to care for yourself after your procedure. Your health care provider may also give you more specific instructions. If you have problems or questions, contact your health care provider. What can I expect after the procedure? After your procedure, it is common to have:  Discomfort at the port insertion site.  Bruising on the skin over the port. This should improve over 3-4 days.  Follow these instructions at home: [facility] care  After your port is placed, you will get a manufacturer's information card. The card has information about your port. Keep this card with you at all times.  Take care of the port as told by your health care provider. Ask your health care provider if you or a family member can get training for taking care of the port at home. A home health care nurse may also take care of the port.  Make sure to remember what type of port you have. Incision care  Follow instructions from your health care provider about how to take care of your port insertion site. Make sure you: ? Wash your hands with soap and water before you change your bandage (dressing). If soap and water are not available, use hand sanitizer. ? Change your dressing as told by your health care provider. ? Leave stitches (sutures), skin glue, or adhesive strips in place. These skin closures may need to stay in place for 2 weeks or longer. If adhesive strip edges start to loosen and curl up, you may trim the loose edges. Do not remove adhesive strips completely unless your health care provider tells you to do that.  Check your port insertion site every day for signs of infection. Check for: ? More redness, swelling, or pain. ? More fluid or blood. ? Warmth. ? Pus or a bad smell. General  instructions  Do not take baths, swim, or use a hot tub until your health care provider approves.  Do not lift anything that is heavier than 10 lb (4.5 kg) for a week, or as told by your health care provider.  Ask your health care provider when it is okay to: ? Return to work or school. ? Resume usual physical activities or sports.  Do not drive for 24 hours if you were given a medicine to help you relax (sedative).  Take over-the-counter and prescription medicines only as told by your health care provider.  Wear a medical alert bracelet in case of an emergency. This will tell any health care providers that you have a port.  Keep all follow-up visits as told by your health care provider. This is important. Contact a health care provider if:  You cannot flush your port with saline as directed, or you cannot draw blood from the port.  You have a fever or chills.  You have more redness, swelling, or pain around your port insertion site.  You have more fluid or blood coming from your port insertion site.  Your port insertion site feels warm to the touch.  You have pus or a bad smell coming from the port insertion site. Get help right away if:  You have chest pain or shortness of breath.  You have bleeding from your port that you cannot control. Summary  Take care of the port as told by your health care provider.  Change your dressing as told by your health care provider.  Keep all follow-up visits as told by your health care provider. This information is not intended to replace advice given to you by your health care provider. Make sure you discuss any questions you have with your health care provider. Document Released: 10/19/2012 Document Revised: 11/20/2015 Document Reviewed: 11/20/2015 Elsevier Interactive Patient Education  2017 [place].   Moderate Conscious Sedation, Adult, Care After These instructions provide you with information about caring for yourself  after your procedure. Your health care provider may also give you more specific instructions. Your treatment has been planned according to current medical practices, but problems sometimes occur. Call your health care provider if you have any problems or questions after your procedure. What can I expect after the procedure? After your procedure, it is common:  To feel sleepy for several hours.  To feel clumsy and have poor balance for several hours.  To have poor judgment for several hours.  To vomit if you eat too soon.  Follow these instructions at home: For at least 24 hours after the procedure:   Do not: ? Participate in activities where you could fall or become injured. ? Drive. ? Use heavy machinery. ? Drink alcohol. ? Take sleeping pills or medicines that cause drowsiness. ? Make important decisions or sign legal documents. ? Take care of children on your own.  Rest. Eating and drinking  Follow the diet recommended by your health care provider.  If you vomit: ? Drink water, juice, or soup when you can drink without vomiting. ? Make sure you have little or no nausea before eating solid foods. General instructions  Have a responsible adult stay with you until you are awake and alert.  Take over-the-counter and prescription medicines only as told by your health care provider.  If you smoke, do not smoke without supervision.  Keep all follow-up visits as told by your health care provider. This is important. Contact a health care provider if:  You keep feeling nauseous or you keep vomiting.  You feel light-headed.  You develop a rash.  You have a fever. Get help right away if:  You have trouble breathing. This information is not intended to replace advice given to you by your health care provider. Make sure you discuss any questions you have with your health care provider. Document Released: 10/19/2012 Document Revised: 06/03/2015 Document Reviewed:  04/20/2015 Elsevier Interactive Patient Education  [employer].

## 2016-09-01 ENCOUNTER — Ambulatory Visit: Payer: Medicaid Other | Admitting: Nutrition

## 2016-09-01 ENCOUNTER — Ambulatory Visit (HOSPITAL_BASED_OUTPATIENT_CLINIC_OR_DEPARTMENT_OTHER): Payer: Medicaid Other

## 2016-09-01 ENCOUNTER — Encounter: Payer: Self-pay | Admitting: Oncology

## 2016-09-01 ENCOUNTER — Other Ambulatory Visit (HOSPITAL_BASED_OUTPATIENT_CLINIC_OR_DEPARTMENT_OTHER): Payer: Medicaid Other

## 2016-09-01 ENCOUNTER — Ambulatory Visit (HOSPITAL_BASED_OUTPATIENT_CLINIC_OR_DEPARTMENT_OTHER): Payer: Medicaid Other | Admitting: Oncology

## 2016-09-01 ENCOUNTER — Ambulatory Visit
Admission: RE | Admit: 2016-09-01 | Discharge: 2016-09-01 | Disposition: A | Discharge status: Home / Self Care | Payer: Medicaid Other | Source: Ambulatory Visit | Attending: Radiation Oncology | Admitting: Radiation Oncology

## 2016-09-01 VITALS — BP 118/67 | HR 56 | Temp 97.4°F | Resp 16

## 2016-09-01 DIAGNOSIS — C3412 Malignant neoplasm of upper lobe, left bronchus or lung: Secondary | ICD-10-CM

## 2016-09-01 DIAGNOSIS — Z5111 Encounter for antineoplastic chemotherapy: Secondary | ICD-10-CM | POA: Diagnosis not present

## 2016-09-01 DIAGNOSIS — E43 Unspecified severe protein-calorie malnutrition: Secondary | ICD-10-CM

## 2016-09-01 DIAGNOSIS — Z510 Encounter for antineoplastic radiation therapy: Secondary | ICD-10-CM | POA: Diagnosis not present

## 2016-09-01 DIAGNOSIS — Z7189 Other specified counseling: Secondary | ICD-10-CM

## 2016-09-01 LAB — CBC WITH DIFFERENTIAL/PLATELET
BASO%: 1.2 % (ref 0.0–2.0)
BASOS ABS: 0 10*3/uL (ref 0.0–0.1)
EOS ABS: 0.1 10*3/uL (ref 0.0–0.5)
EOS%: 3.2 % (ref 0.0–7.0)
HEMATOCRIT: 33.1 % — AB (ref 38.4–49.9)
HEMOGLOBIN: 11.2 g/dL — AB (ref 13.0–17.1)
LYMPH#: 1 10*3/uL (ref 0.9–3.3)
LYMPH%: 26.2 % (ref 14.0–49.0)
MCH: 32.1 pg (ref 27.2–33.4)
MCHC: 33.8 g/dL (ref 32.0–36.0)
MCV: 95.1 fL (ref 79.3–98.0)
MONO#: 0.9 10*3/uL (ref 0.1–0.9)
MONO%: 21.8 % — ABNORMAL HIGH (ref 0.0–14.0)
NEUT#: 1.9 10*3/uL (ref 1.5–6.5)
NEUT%: 47.6 % (ref 39.0–75.0)
PLATELETS: 199 10*3/uL (ref 140–400)
RBC: 3.48 10*6/uL — ABNORMAL LOW (ref 4.20–5.82)
RDW: 15.4 % — AB (ref 11.0–14.6)
WBC: 3.9 10*3/uL — ABNORMAL LOW (ref 4.0–10.3)

## 2016-09-01 LAB — COMPREHENSIVE METABOLIC PANEL
ALBUMIN: 3.3 g/dL — AB (ref 3.5–5.0)
ALK PHOS: 140 U/L (ref 40–150)
ALT: <6 U/L (ref 0–55)
AST: 11 U/L (ref 5–34)
Anion Gap: 5 mEq/L (ref 3–11)
BUN: 6.1 mg/dL — ABNORMAL LOW (ref 7.0–26.0)
CALCIUM: 9.1 mg/dL (ref 8.4–10.4)
CO2: 25 mEq/L (ref 22–29)
Chloride: 108 mEq/L (ref 98–109)
Creatinine: 0.7 mg/dL (ref 0.7–1.3)
EGFR: >90 mL/min/{1.73_m2} (ref >90)
Glucose: 102 mg/dl (ref 70–140)
POTASSIUM: 3.5 meq/L (ref 3.5–5.1)
Sodium: 139 mEq/L (ref 136–145)
TOTAL PROTEIN: 7 g/dL (ref 6.4–8.3)

## 2016-09-01 LAB — MAGNESIUM: Magnesium: 2 mg/dl (ref 1.5–2.5)

## 2016-09-01 MED ORDER — SODIUM CHLORIDE 0.9% FLUSH
10.0000 mL | INTRAVENOUS | Status: DC | PRN
  Administered 2016-09-01: 10 mL
  Filled 2016-09-01: qty 10

## 2016-09-01 MED ORDER — POTASSIUM CHLORIDE 2 MEQ/ML IV SOLN
Freq: Once | INTRAVENOUS | Status: AC
  Administered 2016-09-01: 11:00:00 via INTRAVENOUS
  Filled 2016-09-01: qty 10

## 2016-09-01 MED ORDER — LIDOCAINE-PRILOCAINE 2.5-2.5 % EX CREA: 1.0000 "application " | TOPICAL_CREAM | CUTANEOUS | 2 refills | Status: DC | PRN

## 2016-09-01 MED ORDER — PALONOSETRON HCL INJECTION 0.25 MG/5ML
0.2500 mg | Freq: Once | INTRAVENOUS | Status: AC
  Administered 2016-09-01: 0.25 mg via INTRAVENOUS

## 2016-09-01 MED ORDER — SODIUM CHLORIDE 0.9 % IV SOLN
120.0000 mg/m2 | Freq: Once | INTRAVENOUS | Status: AC
  Administered 2016-09-01: 170 mg via INTRAVENOUS
  Filled 2016-09-01: qty 8.5

## 2016-09-01 MED ORDER — HEPARIN SOD (PORK) LOCK FLUSH 100 UNIT/ML IV SOLN
500.0000 [IU] | Freq: Once | INTRAVENOUS | Status: AC | PRN
  Administered 2016-09-01: 500 [IU]
  Filled 2016-09-01: qty 5

## 2016-09-01 MED ORDER — ALBUTEROL SULFATE HFA 108 (90 BASE) MCG/ACT IN AERS: 2.0000 | INHALATION_SPRAY | Freq: Four times a day (QID) | RESPIRATORY_TRACT | 0 refills | Status: DC | PRN

## 2016-09-01 MED ORDER — SODIUM CHLORIDE 0.9 % IV SOLN
Freq: Once | INTRAVENOUS | Status: AC
  Administered 2016-09-01: 13:00:00 via INTRAVENOUS

## 2016-09-01 MED ORDER — PALONOSETRON HCL INJECTION 0.25 MG/5ML
INTRAVENOUS | Status: AC
  Filled 2016-09-01: qty 5

## 2016-09-01 MED ORDER — SODIUM CHLORIDE 0.9 % IV SOLN
Freq: Once | INTRAVENOUS | Status: AC
  Administered 2016-09-01: 14:00:00 via INTRAVENOUS
  Filled 2016-09-01: qty 5

## 2016-09-01 MED ORDER — CISPLATIN CHEMO INJECTION 100MG/100ML
60.0000 mg/m2 | Freq: Once | INTRAVENOUS | Status: AC
  Administered 2016-09-01: 87 mg via INTRAVENOUS
  Filled 2016-09-01: qty 87

## 2016-09-01 NOTE — Progress Notes (Signed)
Nutrition follow-up completed, during infusion for patient with small cell lung cancer. Patient was sleeping soundly so discussed tube feeding with significant other/wife. Weight was documented as 103 pounds. Patient has not missed any of his tube feedings and reports good tolerance. He is using vital 1.2 at 95 mL an hour for 14 hours daily to provide 1562 cal, 98 g protein, and 1056 mL free water. Patient is eating throughout the day. Patient is not having nutrition impact symptoms.  Estimated nutrition needs: 1600-1800 calories, 80-95 grams protein, 1.8 L fluid.  Nutrition diagnosis: Severe malnutrition continues.  Intervention: Recommended patient continue current tube feeding and oral intake throughout the day. Will monitor weight and adjust tube feeding as needed. Teach back method used.  Monitoring, evaluation, goals: Patient will tolerate oral intake plus tube feeding to meet greater than 90% of estimated nutrition needs.  Next visit: Tuesday, September 11, during infusion.  **Disclaimer: This note was dictated with voice recognition software. Similar sounding words can inadvertently be transcribed and this note may contain transcription errors which may not have been corrected upon publication of note.**

## 2016-09-01 NOTE — Assessment & Plan Note (Addendum)
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  The patient is currently on treatment with systemic chemotherapy with cisplatin 60 MG/M2 on day 1 and etoposide at 120 MG/M2 on days 1, 2 and 3 every 3 weeks. This is being given concurrently with radiotherapy.  He is tolerating his treatment well. Recommend that he proceed with cycle 2 today as scheduled. The patient will continue to have weekly labs.  For COPD, he will continue his treatment with the inhaler. I refilled his albuterol for him today.  For pain management the patient is currently on oxycodone.  For malnutrition he is able to eat more by mouth and he also continues supplemental nutrition via the PEG tube. He is followed by the dietitian.  Return visit will be in 3 weeks prior to cycle 3 of his chemotherapy. The patient will have a restaging CT scan of the chest prior to his next visit.

## 2016-09-01 NOTE — Patient Instructions (Signed)
[facility] Discharge Instructions for Patients Receiving Chemotherapy  Today you received the following chemotherapy agents:  Cisplatin and Etoposide.  To help prevent nausea and vomiting after your treatment, we encourage you to take your nausea medication as directed.   If you develop nausea and vomiting that is not controlled by your nausea medication, call the clinic.   BELOW ARE SYMPTOMS THAT SHOULD BE REPORTED IMMEDIATELY:  *FEVER GREATER THAN 100.5 F  *CHILLS WITH OR WITHOUT FEVER  NAUSEA AND VOMITING THAT IS NOT CONTROLLED WITH YOUR NAUSEA MEDICATION  *UNUSUAL SHORTNESS OF BREATH  *UNUSUAL BRUISING OR BLEEDING  TENDERNESS IN MOUTH AND THROAT WITH OR WITHOUT PRESENCE OF ULCERS  *URINARY PROBLEMS  *BOWEL PROBLEMS  UNUSUAL RASH Items with * indicate a potential emergency and should be followed up as soon as possible.  Feel free to call the clinic you have any questions or concerns. The clinic phone number is [phone].  Please show the [facility] at check-in to the Emergency Department and triage nurse.

## 2016-09-01 NOTE — Progress Notes (Signed)
[facility] Follow up:    Marcus Dew, FNP [address] [name] [address]   DIAGNOSIS: Cancer Staging No matching staging information was found for the patient. Limited stage (T1b, N2, M0) small cell lung cancer diagnosed in July 2018.  SUMMARY OF ONCOLOGIC HISTORY: Oncology History   Patient presented with incidental finding of lung mass being worked up for weight loss.      Lymphadenopathy   04/28/2016 Imaging    CT Chest IMPRESSION: 1.4 x 1.7 cm nodule along the left lateral margin of the aortic large. The nodule is isodense with the aorta. Left hilar and AP window lymphadenopathy.       05/09/2016 Initial Diagnosis    Lymphadenopathy     06/16/2016 Imaging    CT Abdomen      06/18/2016 Imaging    PET IMPRESSION: Hypermetabolic 2.2 cm medial left upper lobe nodule, increased from prior CT, compatible with primary bronchogenic neoplasm. Associated AP window nodal metastasis, progressed from recent CT. Suspected left perihilar nodal metastasis.      07/24/2016 Surgery    PROCEDURE PERFORMED:  Bronchoscopy with biopsy and EBUS with biopsy.      07/27/2016 Pathology Results    Diagnosis Lung, biopsy, Left Upper Lobe - SMALL CELL CARCINOMA.       CURRENT THERAPY: Systemic chemotherapy with cisplatin 60 MG/M2 on day 1 and etoposide 120 MG/M2 on days 1, 2 and 3 every 3 weeks. First dose 08/11/2016. Status post 1 cycle.  INTERVAL HISTORY: Marcus Brooks 56 y.o. male returns for routine follow-up accompanied by his family member. He is seen in the infusion room today. The patient is feeling well today without any complaints except for chest discomfort where his Port-A-Cath was placed yesterday. The patient denies fevers and chills. Denies shortness of breath, cough, hemoptysis. Denies abdominal pain, nausea, vomiting. The patient continues on his tube feeding during the night at 93 mL/h. The patient was weighed densities in radiation  oncology and has gained 3 pounds.The patient requests a refill of his albuterol inhaler. The patient is here for evaluation prior to cycle 2 of his chemotherapy.   Patient Active Problem List   Diagnosis Date Noted  . Small cell carcinoma of upper lobe of left lung ([place]) 07/30/2016  . Encounter for antineoplastic chemotherapy 07/30/2016  . Goals of care, counseling/discussion 07/30/2016  . Lung nodule 05/20/2016  . Symptomatic anemia 05/20/2016  . Pancreatic lesion   . Malnutrition ([place])   . Phlegmon of pancreas 05/09/2016  . Acute on chronic pancreatitis ([place]) 05/09/2016  . Lymphadenopathy 05/09/2016  . Hx of colonic polyps 04/23/2016  . Atherosclerosis of aorta ([place]) 04/09/2016  . History of colonic polyps 02/19/2016  . Dizziness 06/10/2015  . Pancreatic pseudocyst 03/13/2013  . Cellulitis 01/24/2013  . Gastritis 01/24/2013  . Pancreatitis 01/23/2013  . Irritation around percutaneous endoscopic gastrostomy (PEG) tube site ([place]) 01/23/2013  . Hyponatremia 01/19/2013  . Hiatal hernia 01/19/2013  . Acute blood loss anemia 01/18/2013  . 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 Coronary artery disease   . Upper GI bleed 01/17/2013  . Chronic pancreatitis ([place]) 01/17/2013  . Protein-calorie malnutrition, severe ([place]) 01/10/2013  . Hematemesis 01/09/2013  . Pancreatitis, acute 01/09/2013  . Seizure disorder ([place]) 01/09/2013  . Syncope 01/09/2013  . Acute pancreatitis 10/03/2012  . Leukocytosis 10/03/2012  . Transaminitis 10/03/2012  . Chest pain 10/03/2012  . Pain around PEG tube site 03/09/2011  . Hypertension 11/05/2010  . GERD (  gastroesophageal reflux disease) 11/05/2010  . Chronic pain syndrome 08/04/2010  . Tobacco use disorder 08/04/2010    is allergic to latex.  MEDICAL HISTORY: Past Medical History:  Diagnosis Date  . Anxiety states 08/04/2010  . Chronic airway obstruction, not elsewhere classified 08/04/2010  . Chronic pain syndrome 08/04/2010  . Chronic  pancreatitis ([place]) 08/04/2010  . Coronary artery disease   . GERD (gastroesophageal reflux disease)   . Hiatal hernia    EGD 01/18/13  . Insomnia 08/04/2010  . Intellectual disability   . Lung nodule 05/20/2016  . Other abnormal glucose 08/04/2010  . Pancreatitis chronic 08/04/2010  . Protein calorie malnutrition ([place]) 2010  . Seizure ([place]) 08/04/2010  . Severe anemia 05/20/2016  . Small cell carcinoma of upper lobe of left lung ([place]) 07/30/2016  . Splenic vein thrombosis    Chronic.  . Tobacco use disorder 08/04/2010  . Unspecified essential hypertension 08/04/2010    SURGICAL HISTORY: Past Surgical History:  Procedure Laterality Date  . COLONOSCOPY WITH PROPOFOL N/A 06/26/2016   Procedure: COLONOSCOPY WITH PROPOFOL;  Surgeon: Rogene Houston, MD;  [facility];  Service: Endoscopy;  Laterality: N/A;  10:30  . CORONARY ANGIOPLASTY WITH STENT PLACEMENT    . ESOPHAGOGASTRODUODENOSCOPY N/A 01/18/2013   Procedure: ESOPHAGOGASTRODUODENOSCOPY (EGD);  Surgeon: Rogene Houston, MD;  [facility];  Service: Endoscopy;  Laterality: N/A;  . EUS  03/26/2009   [facility]  . GASTROSTOMY-JEJEUNOSTOMY TUBE CHANGE/PLACEMENT  12/11/2008   [facility]  . I&D EXTREMITY Left 09/12/2012   Procedure: IRRIGATION AND DEBRIDEMENT LEFT FIFTH FINGER;  Surgeon: Roseanne Kaufman, MD;  Location: [facility];  Service: Orthopedics;  Laterality: Left;  . IR FLUORO GUIDE PORT INSERTION RIGHT  08/31/2016  . IR US GUIDE VASC ACCESS RIGHT  08/31/2016  . JEJUNOSTOMY FEEDING TUBE  07/13/2010  . NERVE AND TENDON REPAIR Left 09/12/2012   Procedure: NERVE AND TENDON REPAIR LEFT FIFTH FINGER;  Surgeon: Roseanne Kaufman, MD;  Location: [facility];  Service: Orthopedics;  Laterality: Left;  . OPEN REDUCTION INTERNAL FIXATION (ORIF) METACARPAL Left 09/12/2012   Procedure: OPEN REDUCTION INTERNAL FIXATION LEFT FIFTH FINGER;  Surgeon: Roseanne Kaufman, MD;  Location: [facility];  Service: Orthopedics;  Laterality: Left;  . PANCREATIC  PSEUDOCYST DRAINAGE  07/13/2010  . PORTACATH PLACEMENT  11/28/2008   [facility]  . VIDEO BRONCHOSCOPY WITH ENDOBRONCHIAL ULTRASOUND N/A 07/24/2016   Procedure: VIDEO BRONCHOSCOPY WITH ENDOBRONCHIAL ULTRASOUND;  Surgeon: Grace Isaac, MD;  Location: [facility];  Service: Thoracic;  Laterality: N/A;    SOCIAL HISTORY: Social History   Social History  . Marital status: Married    Spouse name: N/A  . Number of children: N/A  . Years of education: N/A   Occupational History  . Not on file.   Social History Main Topics  . Smoking status: Current Every Day Smoker    Packs/day: 0.25    Years: 20.00    Types: Cigarettes  . Smokeless tobacco: Never Used     Comment: 1 pack a day since age 43  . Alcohol use No     Comment: No etoh since 5-6 yrs  . Drug use: No  . Sexual activity: No   Other Topics Concern  . Not on file   Social History Narrative   Intellectual impairment, illiterate.     FAMILY HISTORY: History reviewed. No pertinent family history.  Review of Systems  Constitutional: Positive for fatigue. Negative for chills and fever.  HENT:  Negative.   Eyes:  Negative.   Respiratory: Negative.   Cardiovascular: Negative.   Gastrointestinal: Negative.   Endocrine: Negative.   Genitourinary: Negative.    Musculoskeletal: Negative.   Skin: Negative.   Neurological: Negative.   Hematological: Negative.   Psychiatric/Behavioral: Negative.       PHYSICAL EXAMINATION  ECOG PERFORMANCE STATUS: 1 - Symptomatic but completely ambulatory  Vital signs temp 97.4, pulse 56,respirations 16, blood pressure 118/67, O2 sat 100% on room air There were no vitals filed for this visit.  Physical Exam  Constitutional: He is oriented to person, place, and time.  Thin male in no acute distress.  HENT:  Head: Normocephalic.  Mouth/Throat: Oropharynx is clear and moist. No oropharyngeal exudate.  Eyes: Conjunctivae are normal. No scleral icterus.  Neck: Normal range of motion.  Neck supple.  Cardiovascular: Normal rate, regular rhythm, normal heart sounds and intact distal pulses.   Pulmonary/Chest: Effort normal. No respiratory distress. He has wheezes. He has no rales.  Abdominal: Soft. Bowel sounds are normal. He exhibits no distension and no mass. There is no tenderness.  Musculoskeletal: Normal range of motion. He exhibits no edema.  Lymphadenopathy:    He has no cervical adenopathy.  Neurological: He is alert and oriented to person, place, and time. He exhibits normal muscle tone. Gait normal.  Skin: Skin is warm and dry. No rash noted. No erythema. No pallor.  Psychiatric: Mood, memory, affect and judgment normal.  Vitals reviewed.   LABORATORY DATA:  CBC    Component Value Date/Time   WBC 3.9 (L) 09/01/2016 1013   WBC 3.5 (L) 08/31/2016 0748   RBC 3.48 (L) 09/01/2016 1013   RBC 3.36 (L) 08/31/2016 0748   HGB 11.2 (L) 09/01/2016 1013   HCT 33.1 (L) 09/01/2016 1013   PLT 199 09/01/2016 1013   MCV 95.1 09/01/2016 1013   MCH 32.1 09/01/2016 1013   MCH 31.3 08/31/2016 0748   MCHC 33.8 09/01/2016 1013   MCHC 34.4 08/31/2016 0748   RDW 15.4 (H) 09/01/2016 1013   LYMPHSABS 1.0 09/01/2016 1013   MONOABS 0.9 09/01/2016 1013   EOSABS 0.1 09/01/2016 1013   BASOSABS 0.0 09/01/2016 1013    CMP     Component Value Date/Time   NA 139 09/01/2016 1013   K 3.5 09/01/2016 1013   CL 110 07/24/2016 0717   CO2 25 09/01/2016 1013   GLUCOSE 102 09/01/2016 1013   BUN 6.1 (L) 09/01/2016 1013   CREATININE 0.7 09/01/2016 1013   CALCIUM 9.1 09/01/2016 1013   PROT 7.0 09/01/2016 1013   ALBUMIN 3.3 (L) 09/01/2016 1013   AST 11 09/01/2016 1013   ALT <6 09/01/2016 1013   ALKPHOS 140 09/01/2016 1013   BILITOT <0.22 09/01/2016 1013   GFRNONAA >60 07/24/2016 0717   GFRNONAA >89 07/10/2016 1420   GFRAA >60 07/24/2016 0717   GFRAA >89 07/10/2016 1420    RADIOGRAPHIC STUDIES:  Ir US Guide Vasc Access Right  Result Date: 08/31/2016 INDICATION: 56 year old  with small cell lung cancer. Port-A-Cath needed for chemotherapy in treatment. EXAM: FLUOROSCOPIC AND ULTRASOUND GUIDED PLACEMENT OF A SUBCUTANEOUS PORT COMPARISON:  None. MEDICATIONS: Ancef 2 g; The antibiotic was administered within an appropriate time interval prior to skin puncture. ANESTHESIA/SEDATION: Versed 2.0 mg IV; Fentanyl 100 mcg IV; Moderate Sedation Time:  31 minutes The patient was continuously monitored during the procedure by the interventional radiology nurse under my direct supervision. FLUOROSCOPY TIME:  30 seconds, 1.5 mGy COMPLICATIONS: None immediate. PROCEDURE: The procedure, risks, benefits, and alternatives were explained  to the patient. Questions regarding the procedure were encouraged and answered. The patient understands and consents to the procedure. Patient was placed supine on the interventional table. Ultrasound confirmed a patent right internal jugular vein. The right chest and neck were cleaned with a skin antiseptic and a sterile drape was placed. Maximal barrier sterile technique was utilized including caps, mask, sterile gowns, sterile gloves, sterile drape, hand hygiene and skin antiseptic. The right neck was anesthetized with 1% lidocaine. Small incision was made in the right neck with a blade. Micropuncture set was placed in the right internal jugular vein with ultrasound guidance. The micropuncture wire was used for measurement purposes. The right chest was anesthetized with 1% lidocaine with epinephrine. #15 blade was used to make an incision and a subcutaneous port pocket was formed. [place] was assembled. Subcutaneous tunnel was formed with a stiff tunneling device. The port catheter was brought through the subcutaneous tunnel. The port was placed in the subcutaneous pocket. The micropuncture set was exchanged for a peel-away sheath. The catheter was placed through the peel-away sheath and the tip was positioned at the cavoatrial junction. Catheter placement  was confirmed with fluoroscopy. The port was accessed and flushed with heparinized saline. The port pocket was closed using two layers of absorbable sutures and skin glue. The vein skin site was closed using a single layer of absorbable suture and skin glue. Sterile dressings were applied. Patient tolerated the procedure well without an immediate complication. Ultrasound and fluoroscopic images were taken and saved for this procedure. IMPRESSION: Placement of a subcutaneous port device. Electronically Signed   By: Markus Daft M.D.   On: 08/31/2016 11:50   Ir Fluoro Guide Port Insertion Right  Result Date: 08/31/2016 INDICATION: 56 year old with small cell lung cancer. Port-A-Cath needed for chemotherapy in treatment. EXAM: FLUOROSCOPIC AND ULTRASOUND GUIDED PLACEMENT OF A SUBCUTANEOUS PORT COMPARISON:  None. MEDICATIONS: Ancef 2 g; The antibiotic was administered within an appropriate time interval prior to skin puncture. ANESTHESIA/SEDATION: Versed 2.0 mg IV; Fentanyl 100 mcg IV; Moderate Sedation Time:  31 minutes The patient was continuously monitored during the procedure by the interventional radiology nurse under my direct supervision. FLUOROSCOPY TIME:  30 seconds, 1.5 mGy COMPLICATIONS: None immediate. PROCEDURE: The procedure, risks, benefits, and alternatives were explained to the patient. Questions regarding the procedure were encouraged and answered. The patient understands and consents to the procedure. Patient was placed supine on the interventional table. Ultrasound confirmed a patent right internal jugular vein. The right chest and neck were cleaned with a skin antiseptic and a sterile drape was placed. Maximal barrier sterile technique was utilized including caps, mask, sterile gowns, sterile gloves, sterile drape, hand hygiene and skin antiseptic. The right neck was anesthetized with 1% lidocaine. Small incision was made in the right neck with a blade. Micropuncture set was placed in the right  internal jugular vein with ultrasound guidance. The micropuncture wire was used for measurement purposes. The right chest was anesthetized with 1% lidocaine with epinephrine. #15 blade was used to make an incision and a subcutaneous port pocket was formed. [place] was assembled. Subcutaneous tunnel was formed with a stiff tunneling device. The port catheter was brought through the subcutaneous tunnel. The port was placed in the subcutaneous pocket. The micropuncture set was exchanged for a peel-away sheath. The catheter was placed through the peel-away sheath and the tip was positioned at the cavoatrial junction. Catheter placement was confirmed with fluoroscopy. The port was accessed and flushed  with heparinized saline. The port pocket was closed using two layers of absorbable sutures and skin glue. The vein skin site was closed using a single layer of absorbable suture and skin glue. Sterile dressings were applied. Patient tolerated the procedure well without an immediate complication. Ultrasound and fluoroscopic images were taken and saved for this procedure. IMPRESSION: Placement of a subcutaneous port device. Electronically Signed   By: Markus Daft M.D.   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  The patient is currently on treatment with systemic chemotherapy with cisplatin 60 MG/M2 on day 1 and etoposide at 120 MG/M2 on days 1, 2 and 3 every 3 weeks. This is being given concurrently with radiotherapy.  He is tolerating his treatment well. Recommend that he proceed with cycle 2 today as scheduled. The patient will continue to have weekly labs.  For COPD, he will continue his treatment with the  inhaler. I refilled his albuterol for him today.  For pain management the patient is currently on oxycodone.  For malnutrition he is able to eat more by mouth and he also continues supplemental nutrition via the PEG tube. He is followed by the dietitian.  Return visit will be in 3 weeks prior to cycle 3 of his chemotherapy. The patient will have a restaging CT scan of the chest prior to his next visit.   Orders Placed This Encounter  Procedures  . CT CHEST W CONTRAST    Standing Status:   Future    Standing Expiration Date:   09/01/2017    Order Specific Question:   If indicated for the ordered procedure, I authorize the administration of contrast media per Radiology protocol    Answer:   Yes    Order Specific Question:   Preferred imaging location?    Answer:   [facility]    Order Specific Question:   Radiology Contrast Protocol - do NOT remove file path    Answer:   [email or web address]    Order Specific Question:   Reason for Exam additional comments    Answer:   limited stage smalle cell lung cancer. Restaging.    All questions were answered. The patient knows to call the clinic with any problems, questions or concerns. We can certainly see the patient much sooner if necessary.  Mikey Bussing, NP 09/01/2016

## 2016-09-01 NOTE — Progress Notes (Signed)
Per Dr Julien Nordmann it is okay to proceed with cisplatin today without urine output and creatinine 0.7

## 2016-09-02 ENCOUNTER — Telehealth: Payer: Self-pay | Admitting: Oncology

## 2016-09-02 ENCOUNTER — Ambulatory Visit
Admission: RE | Admit: 2016-09-02 | Discharge: 2016-09-02 | Disposition: A | Discharge status: Home / Self Care | Payer: Medicaid Other | Source: Ambulatory Visit | Attending: Radiation Oncology | Admitting: Radiation Oncology

## 2016-09-02 ENCOUNTER — Ambulatory Visit (HOSPITAL_BASED_OUTPATIENT_CLINIC_OR_DEPARTMENT_OTHER): Payer: Medicaid Other

## 2016-09-02 VITALS — BP 132/67 | HR 57 | Temp 97.7°F | Resp 17

## 2016-09-02 DIAGNOSIS — C3412 Malignant neoplasm of upper lobe, left bronchus or lung: Secondary | ICD-10-CM | POA: Diagnosis not present

## 2016-09-02 DIAGNOSIS — Z5111 Encounter for antineoplastic chemotherapy: Secondary | ICD-10-CM | POA: Diagnosis present

## 2016-09-02 DIAGNOSIS — Z510 Encounter for antineoplastic radiation therapy: Secondary | ICD-10-CM | POA: Diagnosis not present

## 2016-09-02 MED ORDER — DEXAMETHASONE SODIUM PHOSPHATE 10 MG/ML IJ SOLN
10.0000 mg | Freq: Once | INTRAMUSCULAR | Status: AC
  Administered 2016-09-02: 10 mg via INTRAVENOUS

## 2016-09-02 MED ORDER — ACETAMINOPHEN 325 MG PO TABS
ORAL_TABLET | ORAL | Status: AC
  Filled 2016-09-02: qty 2

## 2016-09-02 MED ORDER — SODIUM CHLORIDE 0.9 % IV SOLN
120.0000 mg/m2 | Freq: Once | INTRAVENOUS | Status: AC
  Administered 2016-09-02: 170 mg via INTRAVENOUS
  Filled 2016-09-02: qty 8.5

## 2016-09-02 MED ORDER — DEXAMETHASONE SODIUM PHOSPHATE 10 MG/ML IJ SOLN
INTRAMUSCULAR | Status: AC
  Filled 2016-09-02: qty 1

## 2016-09-02 MED ORDER — HEPARIN SOD (PORK) LOCK FLUSH 100 UNIT/ML IV SOLN
500.0000 [IU] | Freq: Once | INTRAVENOUS | Status: AC | PRN
  Administered 2016-09-02: 500 [IU]
  Filled 2016-09-02: qty 5

## 2016-09-02 MED ORDER — ACETAMINOPHEN 325 MG PO TABS
650.0000 mg | ORAL_TABLET | Freq: Once | ORAL | Status: AC
  Administered 2016-09-02: 650 mg via ORAL

## 2016-09-02 MED ORDER — SODIUM CHLORIDE 0.9% FLUSH
10.0000 mL | INTRAVENOUS | Status: DC | PRN
  Administered 2016-09-02: 10 mL
  Filled 2016-09-02: qty 10

## 2016-09-02 MED ORDER — SODIUM CHLORIDE 0.9 % IV SOLN
Freq: Once | INTRAVENOUS | Status: AC
  Administered 2016-09-02: 08:00:00 via INTRAVENOUS

## 2016-09-02 NOTE — Telephone Encounter (Signed)
Appts already scheduled per 8/21 los - Return CT scan prior to visit with [facility] on 9/11..- no additional appts added.

## 2016-09-02 NOTE — Patient Instructions (Signed)
[facility] Discharge Instructions for Patients Receiving Chemotherapy  Today you received the following chemotherapy agents:  Etoposide  To help prevent nausea and vomiting after your treatment, we encourage you to take your nausea medication as prescribed.   If you develop nausea and vomiting that is not controlled by your nausea medication, call the clinic.   BELOW ARE SYMPTOMS THAT SHOULD BE REPORTED IMMEDIATELY:  *FEVER GREATER THAN 100.5 F  *CHILLS WITH OR WITHOUT FEVER  NAUSEA AND VOMITING THAT IS NOT CONTROLLED WITH YOUR NAUSEA MEDICATION  *UNUSUAL SHORTNESS OF BREATH  *UNUSUAL BRUISING OR BLEEDING  TENDERNESS IN MOUTH AND THROAT WITH OR WITHOUT PRESENCE OF ULCERS  *URINARY PROBLEMS  *BOWEL PROBLEMS  UNUSUAL RASH Items with * indicate a potential emergency and should be followed up as soon as possible.  Feel free to call the clinic you have any questions or concerns. The clinic phone number is [phone].  Please show the [place] at check-in to the Emergency Department and triage nurse.

## 2016-09-03 ENCOUNTER — Ambulatory Visit: Payer: Medicaid Other

## 2016-09-04 ENCOUNTER — Telehealth: Payer: Self-pay

## 2016-09-04 ENCOUNTER — Ambulatory Visit
Admission: RE | Admit: 2016-09-04 | Discharge: 2016-09-04 | Disposition: A | Discharge status: Home / Self Care | Payer: Medicaid Other | Source: Ambulatory Visit | Attending: Radiation Oncology | Admitting: Radiation Oncology

## 2016-09-04 ENCOUNTER — Ambulatory Visit (HOSPITAL_BASED_OUTPATIENT_CLINIC_OR_DEPARTMENT_OTHER): Payer: Medicaid Other

## 2016-09-04 ENCOUNTER — Telehealth: Payer: Self-pay | Admitting: Medical Oncology

## 2016-09-04 VITALS — BP 126/65 | HR 74 | Temp 98.0°F | Resp 17

## 2016-09-04 DIAGNOSIS — Z5111 Encounter for antineoplastic chemotherapy: Secondary | ICD-10-CM

## 2016-09-04 DIAGNOSIS — C3412 Malignant neoplasm of upper lobe, left bronchus or lung: Secondary | ICD-10-CM | POA: Diagnosis not present

## 2016-09-04 DIAGNOSIS — Z510 Encounter for antineoplastic radiation therapy: Secondary | ICD-10-CM | POA: Diagnosis not present

## 2016-09-04 MED ORDER — ONDANSETRON HCL 4 MG/2ML IJ SOLN
INTRAMUSCULAR | Status: AC
  Filled 2016-09-04: qty 4

## 2016-09-04 MED ORDER — SODIUM CHLORIDE 0.9 % IV SOLN
Freq: Once | INTRAVENOUS | Status: AC
  Administered 2016-09-04: 12:00:00 via INTRAVENOUS

## 2016-09-04 MED ORDER — ONDANSETRON HCL 4 MG/2ML IJ SOLN
8.0000 mg | Freq: Once | INTRAMUSCULAR | Status: AC
  Administered 2016-09-04: 8 mg via INTRAVENOUS

## 2016-09-04 MED ORDER — DEXAMETHASONE SODIUM PHOSPHATE 10 MG/ML IJ SOLN
10.0000 mg | Freq: Once | INTRAMUSCULAR | Status: AC
  Administered 2016-09-04: 10 mg via INTRAVENOUS

## 2016-09-04 MED ORDER — HEPARIN SOD (PORK) LOCK FLUSH 100 UNIT/ML IV SOLN
500.0000 [IU] | Freq: Once | INTRAVENOUS | Status: AC | PRN
  Administered 2016-09-04: 500 [IU]
  Filled 2016-09-04: qty 5

## 2016-09-04 MED ORDER — SODIUM CHLORIDE 0.9 % IV SOLN
120.0000 mg/m2 | Freq: Once | INTRAVENOUS | Status: AC
  Administered 2016-09-04: 170 mg via INTRAVENOUS
  Filled 2016-09-04: qty 8.5

## 2016-09-04 MED ORDER — SODIUM CHLORIDE 0.9% FLUSH
10.0000 mL | INTRAVENOUS | Status: DC | PRN
  Administered 2016-09-04: 10 mL
  Filled 2016-09-04: qty 10

## 2016-09-04 MED ORDER — DEXAMETHASONE SODIUM PHOSPHATE 10 MG/ML IJ SOLN
INTRAMUSCULAR | Status: AC
  Filled 2016-09-04: qty 1

## 2016-09-04 NOTE — Telephone Encounter (Signed)
Chemo appt added per inbasket message 09/04/16 dr mkm/ok per charge Lou Cal

## 2016-09-04 NOTE — Patient Instructions (Signed)
[place] Discharge Instructions for Patients Receiving Chemotherapy  Today you received the following chemotherapy agents:  Etoposide  To help prevent nausea and vomiting after your treatment, we encourage you to take your nausea medication as prescribed.   If you develop nausea and vomiting that is not controlled by your nausea medication, call the clinic.   BELOW ARE SYMPTOMS THAT SHOULD BE REPORTED IMMEDIATELY:  *FEVER GREATER THAN 100.5 F  *CHILLS WITH OR WITHOUT FEVER  NAUSEA AND VOMITING THAT IS NOT CONTROLLED WITH YOUR NAUSEA MEDICATION  *UNUSUAL SHORTNESS OF BREATH  *UNUSUAL BRUISING OR BLEEDING  TENDERNESS IN MOUTH AND THROAT WITH OR WITHOUT PRESENCE OF ULCERS  *URINARY PROBLEMS  *BOWEL PROBLEMS  UNUSUAL RASH Items with * indicate a potential emergency and should be followed up as soon as possible.  Feel free to call the clinic you have any questions or concerns. The clinic phone number is [phone].  Please show the [place] at check-in to the Emergency Department and triage nurse.

## 2016-09-04 NOTE — Telephone Encounter (Signed)
Pt missed chemo yesterday and wants to r/s to today. Schedule message sent.

## 2016-09-04 NOTE — Progress Notes (Addendum)
Patient reports having a lot of nausea yesterday and canceled chemotherapy and radiaton.  He also reports that he has a frequent cough that may irritate his thorat.  He said that he saw a small amount of blood when he vomited yesterday.  He would like to go directly to his chemotherapy appointment and does not want to stay and see a doctor in radiation.  Vitals within normal limit.  He denies feeling lightheaded.  Message left for Abelina Bachelor, RN with Dr. Worthy Flank office and with Infusion.

## 2016-09-07 ENCOUNTER — Ambulatory Visit
Admission: RE | Admit: 2016-09-07 | Discharge: 2016-09-07 | Disposition: A | Discharge status: Home / Self Care | Payer: Medicaid Other | Source: Ambulatory Visit | Attending: Radiation Oncology | Admitting: Radiation Oncology

## 2016-09-07 DIAGNOSIS — Z510 Encounter for antineoplastic radiation therapy: Secondary | ICD-10-CM | POA: Diagnosis not present

## 2016-09-08 ENCOUNTER — Other Ambulatory Visit (HOSPITAL_BASED_OUTPATIENT_CLINIC_OR_DEPARTMENT_OTHER): Payer: Medicaid Other

## 2016-09-08 ENCOUNTER — Ambulatory Visit
Admission: RE | Admit: 2016-09-08 | Discharge: 2016-09-08 | Disposition: A | Discharge status: Home / Self Care | Payer: Medicaid Other | Source: Ambulatory Visit | Attending: Radiation Oncology | Admitting: Radiation Oncology

## 2016-09-08 ENCOUNTER — Other Ambulatory Visit: Payer: Self-pay | Admitting: Internal Medicine

## 2016-09-08 ENCOUNTER — Telehealth: Payer: Self-pay

## 2016-09-08 DIAGNOSIS — Z5111 Encounter for antineoplastic chemotherapy: Secondary | ICD-10-CM

## 2016-09-08 DIAGNOSIS — C3412 Malignant neoplasm of upper lobe, left bronchus or lung: Secondary | ICD-10-CM | POA: Diagnosis not present

## 2016-09-08 DIAGNOSIS — R52 Pain, unspecified: Secondary | ICD-10-CM

## 2016-09-08 DIAGNOSIS — Z79891 Long term (current) use of opiate analgesic: Secondary | ICD-10-CM

## 2016-09-08 DIAGNOSIS — Z510 Encounter for antineoplastic radiation therapy: Secondary | ICD-10-CM | POA: Diagnosis not present

## 2016-09-08 DIAGNOSIS — Z7189 Other specified counseling: Secondary | ICD-10-CM

## 2016-09-08 LAB — CBC WITH DIFFERENTIAL/PLATELET
BASO%: 0.6 % (ref 0.0–2.0)
Basophils Absolute: 0 10*3/uL (ref 0.0–0.1)
EOS%: 0.7 % (ref 0.0–7.0)
Eosinophils Absolute: 0.1 10*3/uL (ref 0.0–0.5)
HCT: 32.6 % — ABNORMAL LOW (ref 38.4–49.9)
HEMOGLOBIN: 11.2 g/dL — AB (ref 13.0–17.1)
LYMPH%: 11.6 % — AB (ref 14.0–49.0)
MCH: 32.4 pg (ref 27.2–33.4)
MCHC: 34.4 g/dL (ref 32.0–36.0)
MCV: 94.2 fL (ref 79.3–98.0)
MONO#: 0.1 10*3/uL (ref 0.1–0.9)
MONO%: 0.7 % (ref 0.0–14.0)
NEUT%: 86.4 % — ABNORMAL HIGH (ref 39.0–75.0)
NEUTROS ABS: 6 10*3/uL (ref 1.5–6.5)
Platelets: 150 10*3/uL (ref 140–400)
RBC: 3.46 10*6/uL — AB (ref 4.20–5.82)
RDW: 14.3 % (ref 11.0–14.6)
WBC: 6.9 10*3/uL (ref 4.0–10.3)
lymph#: 0.8 10*3/uL — ABNORMAL LOW (ref 0.9–3.3)

## 2016-09-08 LAB — COMPREHENSIVE METABOLIC PANEL
ALBUMIN: 3.5 g/dL (ref 3.5–5.0)
ALK PHOS: 112 U/L (ref 40–150)
ALT: 10 U/L (ref 0–55)
AST: 12 U/L (ref 5–34)
Anion Gap: 8 mEq/L (ref 3–11)
BILIRUBIN TOTAL: 0.31 mg/dL (ref 0.20–1.20)
BUN: 9.7 mg/dL (ref 7.0–26.0)
CO2: 27 mEq/L (ref 22–29)
CREATININE: 0.7 mg/dL (ref 0.7–1.3)
Calcium: 9.4 mg/dL (ref 8.4–10.4)
Chloride: 105 mEq/L (ref 98–109)
GLUCOSE: 104 mg/dL (ref 70–140)
Potassium: 3.3 mEq/L — ABNORMAL LOW (ref 3.5–5.1)
SODIUM: 141 meq/L (ref 136–145)
TOTAL PROTEIN: 7.4 g/dL (ref 6.4–8.3)

## 2016-09-08 LAB — MAGNESIUM: MAGNESIUM: 1.9 mg/dL (ref 1.5–2.5)

## 2016-09-08 MED ORDER — OXYCODONE HCL 10 MG PO TABS: 10.0000 mg | ORAL_TABLET | Freq: Four times a day (QID) | ORAL | 0 refills | Status: DC | PRN

## 2016-09-09 ENCOUNTER — Ambulatory Visit
Admission: RE | Admit: 2016-09-09 | Discharge: 2016-09-09 | Disposition: A | Discharge status: Home / Self Care | Payer: Medicaid Other | Source: Ambulatory Visit | Attending: Radiation Oncology | Admitting: Radiation Oncology

## 2016-09-09 DIAGNOSIS — Z510 Encounter for antineoplastic radiation therapy: Secondary | ICD-10-CM | POA: Diagnosis not present

## 2016-09-10 ENCOUNTER — Ambulatory Visit
Admission: RE | Admit: 2016-09-10 | Discharge: 2016-09-10 | Disposition: A | Discharge status: Home / Self Care | Payer: Medicaid Other | Source: Ambulatory Visit | Attending: Radiation Oncology | Admitting: Radiation Oncology

## 2016-09-10 DIAGNOSIS — Z510 Encounter for antineoplastic radiation therapy: Secondary | ICD-10-CM | POA: Diagnosis not present

## 2016-09-11 ENCOUNTER — Ambulatory Visit
Admission: RE | Admit: 2016-09-11 | Discharge: 2016-09-11 | Disposition: A | Discharge status: Home / Self Care | Payer: Medicaid Other | Source: Ambulatory Visit | Attending: Radiation Oncology | Admitting: Radiation Oncology

## 2016-09-11 DIAGNOSIS — Z510 Encounter for antineoplastic radiation therapy: Secondary | ICD-10-CM | POA: Diagnosis not present

## 2016-09-15 ENCOUNTER — Other Ambulatory Visit (HOSPITAL_BASED_OUTPATIENT_CLINIC_OR_DEPARTMENT_OTHER): Payer: Medicaid Other

## 2016-09-15 ENCOUNTER — Other Ambulatory Visit: Payer: Self-pay | Admitting: *Deleted

## 2016-09-15 ENCOUNTER — Ambulatory Visit
Admission: RE | Admit: 2016-09-15 | Discharge: 2016-09-15 | Disposition: A | Discharge status: Home / Self Care | Payer: Medicaid Other | Source: Ambulatory Visit | Attending: Radiation Oncology | Admitting: Radiation Oncology

## 2016-09-15 DIAGNOSIS — Z7189 Other specified counseling: Secondary | ICD-10-CM

## 2016-09-15 DIAGNOSIS — C3412 Malignant neoplasm of upper lobe, left bronchus or lung: Secondary | ICD-10-CM

## 2016-09-15 DIAGNOSIS — Z510 Encounter for antineoplastic radiation therapy: Secondary | ICD-10-CM | POA: Diagnosis not present

## 2016-09-15 DIAGNOSIS — Z5111 Encounter for antineoplastic chemotherapy: Secondary | ICD-10-CM

## 2016-09-15 LAB — CBC WITH DIFFERENTIAL/PLATELET
BASO%: 1.2 % (ref 0.0–2.0)
Basophils Absolute: 0 10*3/uL (ref 0.0–0.1)
EOS ABS: 0.1 10*3/uL (ref 0.0–0.5)
EOS%: 2.3 % (ref 0.0–7.0)
HEMATOCRIT: 30.9 % — AB (ref 38.4–49.9)
HEMOGLOBIN: 10.9 g/dL — AB (ref 13.0–17.1)
LYMPH#: 0.9 10*3/uL (ref 0.9–3.3)
LYMPH%: 22.5 % (ref 14.0–49.0)
MCH: 33.2 pg (ref 27.2–33.4)
MCHC: 35.3 g/dL (ref 32.0–36.0)
MCV: 94 fL (ref 79.3–98.0)
MONO#: 0.5 10*3/uL (ref 0.1–0.9)
MONO%: 13.6 % (ref 0.0–14.0)
NEUT%: 60.4 % (ref 39.0–75.0)
NEUTROS ABS: 2.4 10*3/uL (ref 1.5–6.5)
PLATELETS: 124 10*3/uL — AB (ref 140–400)
RBC: 3.29 10*6/uL — AB (ref 4.20–5.82)
RDW: 15.1 % — ABNORMAL HIGH (ref 11.0–14.6)
WBC: 4 10*3/uL (ref 4.0–10.3)

## 2016-09-15 LAB — COMPREHENSIVE METABOLIC PANEL
ALBUMIN: 3.5 g/dL (ref 3.5–5.0)
ALK PHOS: 116 U/L (ref 40–150)
ALT: 8 U/L (ref 0–55)
ANION GAP: 9 meq/L (ref 3–11)
AST: 13 U/L (ref 5–34)
BILIRUBIN TOTAL: 0.3 mg/dL (ref 0.20–1.20)
BUN: 5.9 mg/dL — ABNORMAL LOW (ref 7.0–26.0)
CALCIUM: 9.2 mg/dL (ref 8.4–10.4)
CO2: 25 mEq/L (ref 22–29)
CREATININE: 0.7 mg/dL (ref 0.7–1.3)
Chloride: 107 mEq/L (ref 98–109)
Glucose: 111 mg/dl (ref 70–140)
Potassium: 2.8 mEq/L — CL (ref 3.5–5.1)
Sodium: 141 mEq/L (ref 136–145)
Total Protein: 7.4 g/dL (ref 6.4–8.3)

## 2016-09-15 LAB — MAGNESIUM: Magnesium: 2 mg/dl (ref 1.5–2.5)

## 2016-09-15 NOTE — Telephone Encounter (Signed)
Called pt lmovm that Rx for K+ has been sent to [facility]. Request for pt to call back to confirm message was received.

## 2016-09-16 ENCOUNTER — Ambulatory Visit: Payer: Medicaid Other

## 2016-09-17 ENCOUNTER — Ambulatory Visit: Payer: Medicaid Other | Admitting: Family Medicine

## 2016-09-17 ENCOUNTER — Other Ambulatory Visit: Payer: Self-pay | Admitting: Family Medicine

## 2016-09-17 ENCOUNTER — Ambulatory Visit: Payer: Medicaid Other

## 2016-09-18 ENCOUNTER — Telehealth (HOSPITAL_COMMUNITY): Payer: Self-pay | Admitting: Family Medicine

## 2016-09-18 ENCOUNTER — Ambulatory Visit
Admission: RE | Admit: 2016-09-18 | Discharge: 2016-09-18 | Disposition: A | Discharge status: Home / Self Care | Payer: Medicaid Other | Source: Ambulatory Visit | Attending: Radiation Oncology | Admitting: Radiation Oncology

## 2016-09-18 ENCOUNTER — Ambulatory Visit (HOSPITAL_COMMUNITY)
Admission: RE | Admit: 2016-09-18 | Discharge: 2016-09-18 | Disposition: A | Discharge status: Home / Self Care | Payer: Medicaid Other | Source: Ambulatory Visit | Attending: Oncology | Admitting: Oncology

## 2016-09-18 ENCOUNTER — Encounter (HOSPITAL_COMMUNITY): Payer: Self-pay

## 2016-09-18 DIAGNOSIS — J439 Emphysema, unspecified: Secondary | ICD-10-CM | POA: Insufficient documentation

## 2016-09-18 DIAGNOSIS — Z510 Encounter for antineoplastic radiation therapy: Secondary | ICD-10-CM | POA: Diagnosis not present

## 2016-09-18 DIAGNOSIS — R918 Other nonspecific abnormal finding of lung field: Secondary | ICD-10-CM | POA: Diagnosis not present

## 2016-09-18 DIAGNOSIS — C3412 Malignant neoplasm of upper lobe, left bronchus or lung: Secondary | ICD-10-CM | POA: Insufficient documentation

## 2016-09-18 DIAGNOSIS — R590 Localized enlarged lymph nodes: Secondary | ICD-10-CM | POA: Insufficient documentation

## 2016-09-18 MED ORDER — IOPAMIDOL (ISOVUE-300) INJECTION 61%
INTRAVENOUS | Status: AC
  Filled 2016-09-18: qty 75

## 2016-09-18 MED ORDER — POTASSIUM CHLORIDE CRYS ER 20 MEQ PO TBCR: 20.0000 meq | EXTENDED_RELEASE_TABLET | Freq: Two times a day (BID) | ORAL | 0 refills | Status: DC

## 2016-09-18 MED ORDER — IOPAMIDOL (ISOVUE-300) INJECTION 61%
75.0000 mL | Freq: Once | INTRAVENOUS | Status: AC | PRN
  Administered 2016-09-18: 75 mL via INTRAVENOUS

## 2016-09-18 MED ORDER — HEPARIN SOD (PORK) LOCK FLUSH 100 UNIT/ML IV SOLN
500.0000 [IU] | Freq: Once | INTRAVENOUS | Status: AC
  Administered 2016-09-18: 500 [IU] via INTRAVENOUS

## 2016-09-18 MED ORDER — HEPARIN SOD (PORK) LOCK FLUSH 100 UNIT/ML IV SOLN
INTRAVENOUS | Status: AC
  Filled 2016-09-18: qty 5

## 2016-09-18 NOTE — Telephone Encounter (Signed)
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 Form left on MD's desk.

## 2016-09-21 ENCOUNTER — Ambulatory Visit
Admission: RE | Admit: 2016-09-21 | Discharge: 2016-09-21 | Disposition: A | Discharge status: Home / Self Care | Payer: Medicaid Other | Source: Ambulatory Visit | Attending: Radiation Oncology | Admitting: Radiation Oncology

## 2016-09-21 ENCOUNTER — Ambulatory Visit: Payer: Medicaid Other

## 2016-09-21 DIAGNOSIS — Z510 Encounter for antineoplastic radiation therapy: Secondary | ICD-10-CM | POA: Diagnosis not present

## 2016-09-22 ENCOUNTER — Telehealth: Payer: Self-pay | Admitting: Internal Medicine

## 2016-09-22 ENCOUNTER — Ambulatory Visit (HOSPITAL_BASED_OUTPATIENT_CLINIC_OR_DEPARTMENT_OTHER): Payer: Medicaid Other | Admitting: Internal Medicine

## 2016-09-22 ENCOUNTER — Ambulatory Visit: Payer: Medicaid Other

## 2016-09-22 ENCOUNTER — Other Ambulatory Visit: Payer: Self-pay | Admitting: *Deleted

## 2016-09-22 ENCOUNTER — Ambulatory Visit (HOSPITAL_BASED_OUTPATIENT_CLINIC_OR_DEPARTMENT_OTHER): Payer: Medicaid Other

## 2016-09-22 ENCOUNTER — Ambulatory Visit: Payer: Medicaid Other | Admitting: Nutrition

## 2016-09-22 ENCOUNTER — Other Ambulatory Visit (HOSPITAL_BASED_OUTPATIENT_CLINIC_OR_DEPARTMENT_OTHER): Payer: Medicaid Other

## 2016-09-22 ENCOUNTER — Ambulatory Visit
Admission: RE | Admit: 2016-09-22 | Discharge: 2016-09-22 | Disposition: A | Discharge status: Home / Self Care | Payer: Medicaid Other | Source: Ambulatory Visit | Attending: Radiation Oncology | Admitting: Radiation Oncology

## 2016-09-22 ENCOUNTER — Encounter: Payer: Self-pay | Admitting: Internal Medicine

## 2016-09-22 VITALS — BP 107/75 | HR 60 | Temp 97.7°F | Resp 18 | Ht 66.0 in | Wt 102.9 lb

## 2016-09-22 DIAGNOSIS — Z5111 Encounter for antineoplastic chemotherapy: Secondary | ICD-10-CM

## 2016-09-22 DIAGNOSIS — Z7189 Other specified counseling: Secondary | ICD-10-CM

## 2016-09-22 DIAGNOSIS — C3412 Malignant neoplasm of upper lobe, left bronchus or lung: Secondary | ICD-10-CM

## 2016-09-22 DIAGNOSIS — E441 Mild protein-calorie malnutrition: Secondary | ICD-10-CM

## 2016-09-22 DIAGNOSIS — Z510 Encounter for antineoplastic radiation therapy: Secondary | ICD-10-CM | POA: Diagnosis not present

## 2016-09-22 LAB — CBC WITH DIFFERENTIAL/PLATELET
BASO%: 1 % (ref 0.0–2.0)
Basophils Absolute: 0 10*3/uL (ref 0.0–0.1)
EOS ABS: 0.1 10*3/uL (ref 0.0–0.5)
EOS%: 2.6 % (ref 0.0–7.0)
HCT: 30.2 % — ABNORMAL LOW (ref 38.4–49.9)
HGB: 10.3 g/dL — ABNORMAL LOW (ref 13.0–17.1)
LYMPH%: 24.2 % (ref 14.0–49.0)
MCH: 32.7 pg (ref 27.2–33.4)
MCHC: 34.1 g/dL (ref 32.0–36.0)
MCV: 95.9 fL (ref 79.3–98.0)
MONO#: 0.4 10*3/uL (ref 0.1–0.9)
MONO%: 14.1 % — AB (ref 0.0–14.0)
NEUT%: 58.1 % (ref 39.0–75.0)
NEUTROS ABS: 1.8 10*3/uL (ref 1.5–6.5)
Platelets: 138 10*3/uL — ABNORMAL LOW (ref 140–400)
RBC: 3.15 10*6/uL — AB (ref 4.20–5.82)
RDW: 15.8 % — AB (ref 11.0–14.6)
WBC: 3.1 10*3/uL — AB (ref 4.0–10.3)
lymph#: 0.7 10*3/uL — ABNORMAL LOW (ref 0.9–3.3)

## 2016-09-22 LAB — COMPREHENSIVE METABOLIC PANEL
ALT: <6 U/L (ref 0–55)
AST: 11 U/L (ref 5–34)
Albumin: 3.1 g/dL — ABNORMAL LOW (ref 3.5–5.0)
Alkaline Phosphatase: 116 U/L (ref 40–150)
Anion Gap: 8 mEq/L (ref 3–11)
BUN: 7.2 mg/dL (ref 7.0–26.0)
CO2: 25 meq/L (ref 22–29)
Calcium: 8.8 mg/dL (ref 8.4–10.4)
Chloride: 109 mEq/L (ref 98–109)
Creatinine: 0.7 mg/dL (ref 0.7–1.3)
GLUCOSE: 100 mg/dL (ref 70–140)
POTASSIUM: 3.3 meq/L — AB (ref 3.5–5.1)
SODIUM: 142 meq/L (ref 136–145)
TOTAL PROTEIN: 6.6 g/dL (ref 6.4–8.3)
Total Bilirubin: 0.24 mg/dL (ref 0.20–1.20)

## 2016-09-22 LAB — MAGNESIUM: Magnesium: 1.8 mg/dl (ref 1.5–2.5)

## 2016-09-22 MED ORDER — SODIUM CHLORIDE 0.9 % IV SOLN
Freq: Once | INTRAVENOUS | Status: AC
  Filled 2016-09-22: qty 250

## 2016-09-22 MED ORDER — PALONOSETRON HCL INJECTION 0.25 MG/5ML
INTRAVENOUS | Status: AC
  Filled 2016-09-22: qty 5

## 2016-09-22 MED ORDER — SODIUM CHLORIDE 0.9 % IV SOLN
120.0000 mg/m2 | Freq: Once | INTRAVENOUS | Status: AC
  Administered 2016-09-22: 170 mg via INTRAVENOUS
  Filled 2016-09-22: qty 8.5

## 2016-09-22 MED ORDER — FOSAPREPITANT DIMEGLUMINE INJECTION 150 MG
Freq: Once | INTRAVENOUS | Status: AC
  Administered 2016-09-22: 12:00:00 via INTRAVENOUS
  Filled 2016-09-22: qty 5

## 2016-09-22 MED ORDER — HEPARIN SOD (PORK) LOCK FLUSH 100 UNIT/ML IV SOLN
500.0000 [IU] | Freq: Once | INTRAVENOUS | Status: AC | PRN
  Administered 2016-09-22: 500 [IU]
  Filled 2016-09-22: qty 5

## 2016-09-22 MED ORDER — SODIUM CHLORIDE 0.9 % IV SOLN
60.0000 mg/m2 | Freq: Once | INTRAVENOUS | Status: AC
  Administered 2016-09-22: 87 mg via INTRAVENOUS
  Filled 2016-09-22: qty 87

## 2016-09-22 MED ORDER — SODIUM CHLORIDE 0.9 % IV SOLN: Freq: Once | INTRAVENOUS | Status: DC

## 2016-09-22 MED ORDER — PALONOSETRON HCL INJECTION 0.25 MG/5ML
0.2500 mg | Freq: Once | INTRAVENOUS | Status: AC
  Administered 2016-09-22: 0.25 mg via INTRAVENOUS

## 2016-09-22 MED ORDER — POTASSIUM CHLORIDE 2 MEQ/ML IV SOLN
Freq: Once | INTRAVENOUS | Status: DC
  Filled 2016-09-22: qty 20

## 2016-09-22 MED ORDER — SODIUM CHLORIDE 0.9 % IV SOLN
INTRAVENOUS | Status: DC
  Administered 2016-09-22: 14:00:00 via INTRAVENOUS

## 2016-09-22 MED ORDER — SODIUM CHLORIDE 0.9% FLUSH
10.0000 mL | INTRAVENOUS | Status: DC | PRN
  Administered 2016-09-22: 10 mL
  Filled 2016-09-22: qty 10

## 2016-09-22 MED ORDER — SODIUM CHLORIDE 0.9 % IV SOLN
Freq: Once | INTRAVENOUS | Status: AC
  Administered 2016-09-22: 10:00:00 via INTRAVENOUS

## 2016-09-22 NOTE — Progress Notes (Signed)
Nutrition follow-up completed with patient during infusion for small cell lung cancer. Patient's weight is stable and documented as 102 pounds. He is using vital 1.2 at 95 mL an hour over 14 hours via PEG to provide 1562 cal, 98 g protein, and 1056 mL free water. Patient is reportedly eating well throughout the day. Wife reports patient has nausea and vomiting about 3 times a week.  Estimated nutrition needs: 1600-1800 calories, 8-95 grams protein, 1.8 L fluid.  Nutrition diagnosis: Severe malnutrition continues.  Intervention: Recommended patient continue vital 1.2 as tolerated to provide 1562 cal. Encouraged increased oral intake as tolerated. Also educated patient on the importance of hydration. Teach back method used.  Monitoring, evaluation, goals: Patient will tolerate oral intake with tube feeding to meet greater than 90%.  Estimated nutrition needs.  Next visit: Tuesday, October 2, during infusion.  **Disclaimer: This note was dictated with voice recognition software. Similar sounding words can inadvertently be transcribed and this note may contain transcription errors which may not have been corrected upon publication of note.**

## 2016-09-22 NOTE — Progress Notes (Signed)
Per Abigail Butts, RN, ok to dc pt when etoposide infusion completes.  No need to finish additional hydration fluids.  Pt to receive additional hydration fluids with treatment tomorrow per Dr. Julien Nordmann.

## 2016-09-22 NOTE — Patient Instructions (Signed)
Steps to Quit Smoking Smoking tobacco can be bad for your health. It can also affect almost every organ in your body. Smoking puts you and people around you at risk for many serious long-lasting (chronic) diseases. Quitting smoking is hard, but it is one of the best things that you can do for your health. It is never too late to quit. What are the benefits of quitting smoking? When you quit smoking, you lower your risk for getting serious diseases and conditions. They can include:  Lung cancer or lung disease.  Heart disease.  Stroke.  Heart attack.  Not being able to have children (infertility).  Weak bones (osteoporosis) and broken bones (fractures).  If you have coughing, wheezing, and shortness of breath, those symptoms may get better when you quit. You may also get sick less often. If you are pregnant, quitting smoking can help to lower your chances of having a baby of low birth weight. What can I do to help me quit smoking? Talk with your doctor about what can help you quit smoking. Some things you can do (strategies) include:  Quitting smoking totally, instead of slowly cutting back how much you smoke over a period of time.  Going to in-person counseling. You are more likely to quit if you go to many counseling sessions.  Using resources and support systems, such as: ? Online chats with a counselor. ? Phone quitlines. ? Printed self-help materials. ? Support groups or group counseling. ? Text messaging programs. ? Mobile phone apps or applications.  Taking medicines. Some of these medicines may have nicotine in them. If you are pregnant or breastfeeding, do not take any medicines to quit smoking unless your doctor says it is okay. Talk with your doctor about counseling or other things that can help you.  Talk with your doctor about using more than one strategy at the same time, such as taking medicines while you are also going to in-person counseling. This can help make  quitting easier. What things can I do to make it easier to quit? Quitting smoking might feel very hard at first, but there is a lot that you can do to make it easier. Take these steps:  Talk to your family and friends. Ask them to support and encourage you.  Call phone quitlines, reach out to support groups, or work with a counselor.  Ask people who smoke to not smoke around you.  Avoid places that make you want (trigger) to smoke, such as: ? Bars. ? Parties. ? Smoke-break areas at work.  Spend time with people who do not smoke.  Lower the stress in your life. Stress can make you want to smoke. Try these things to help your stress: ? Getting regular exercise. ? Deep-breathing exercises. ? Yoga. ? Meditating. ? Doing a body scan. To do this, close your eyes, focus on one area of your body at a time from head to toe, and notice which parts of your body are tense. Try to relax the muscles in those areas.  Download or buy apps on your mobile phone or tablet that can help you stick to your quit plan. There are many free apps, such as QuitGuide from the CDC (Centers for Disease Control and Prevention). You can find more support from smokefree.gov and other websites.  This information is not intended to replace advice given to you by your health care provider. Make sure you discuss any questions you have with your health care provider. Document Released: 10/25/2008 Document   Revised: 08/27/2015 Document Reviewed: 05/15/2014 Elsevier Interactive Patient Education  2018 [employer]vier Inc.  

## 2016-09-22 NOTE — Patient Instructions (Signed)
[facility] Discharge Instructions for Patients Receiving Chemotherapy  Today you received the following chemotherapy agents Cisplatin and Etoposide  To help prevent nausea and vomiting after your treatment, we encourage you to take your nausea medication as directed  If you develop nausea and vomiting that is not controlled by your nausea medication, call the clinic.   BELOW ARE SYMPTOMS THAT SHOULD BE REPORTED IMMEDIATELY:  *FEVER GREATER THAN 100.5 F  *CHILLS WITH OR WITHOUT FEVER  NAUSEA AND VOMITING THAT IS NOT CONTROLLED WITH YOUR NAUSEA MEDICATION  *UNUSUAL SHORTNESS OF BREATH  *UNUSUAL BRUISING OR BLEEDING  TENDERNESS IN MOUTH AND THROAT WITH OR WITHOUT PRESENCE OF ULCERS  *URINARY PROBLEMS  *BOWEL PROBLEMS  UNUSUAL RASH Items with * indicate a potential emergency and should be followed up as soon as possible.  Feel free to call the clinic you have any questions or concerns. The clinic phone number is [phone].  Please show the [place] at check-in to the Emergency Department and triage nurse.

## 2016-09-22 NOTE — Progress Notes (Signed)
Marcus Brooks [phone]   [phone]  OFFICE PROGRESS NOTE  [name], FNP [address] [name] [address]  DIAGNOSIS: Limited stage (T1b, N2, M0) small cell lung cancer diagnosed in July 2018.  PRIOR THERAPY: None  CURRENT THERAPY: Systemic chemotherapy with cisplatin 60 MG/M2 on day 1 and etoposide 120 MG/M2 on days 1, 2 and 3 every 3 weeks. First dose 08/11/2016. Status post 2 cycles. This is concurrent with radiation.  INTERVAL HISTORY: Marcus Brooks 56 y.o. male returns to the clinic today for follow-up visit accompanied by a family member. The patient is feeling fine today with no specific complaints. He has been tolerating his course of systemic chemotherapy and radiation fairly well. He denied having any chest pain, shortness of breath, cough or hemoptysis. He denied having any fever or chills. He has no nausea, vomiting, diarrhea or constipation. He denied having any current weight loss or night sweats. He had repeat CT scan of the chest performed recently and he is here for evaluation and discussion of his scan results.   MEDICAL HISTORY: Past Medical History:  Diagnosis Date  . Anxiety states 08/04/2010  . Chronic airway obstruction, not elsewhere classified 08/04/2010  . Chronic pain syndrome 08/04/2010  . Chronic pancreatitis ([facility]) 08/04/2010  . Coronary artery disease   . GERD (gastroesophageal reflux disease)   . Hiatal hernia    EGD 01/18/13  . Insomnia 08/04/2010  . Intellectual disability   . Lung nodule 05/20/2016  . Other abnormal glucose 08/04/2010  . Pancreatitis chronic 08/04/2010  . Protein calorie malnutrition ([facility]) 2010  . Seizure ([place]) 08/04/2010  . Severe anemia 05/20/2016  . Small cell carcinoma of upper lobe of left lung ([facility]) 07/30/2016  . Splenic vein thrombosis    Chronic.  . Tobacco use disorder 08/04/2010  . Unspecified essential hypertension 08/04/2010    ALLERGIES:  is allergic to  latex.  MEDICATIONS:  Current Outpatient Prescriptions  Medication Sig Dispense Refill  . Adhesive Tape (CLOTH ADHESIVE SURG 1/2"X10YD) TAPE 1 each by Does not apply route 2 (two) times daily as needed. 1 each 2  . albuterol (PROVENTIL HFA;VENTOLIN HFA) 108 (90 Base) MCG/ACT inhaler Inhale 2 puffs into the lungs every 6 (six) hours as needed for wheezing or shortness of breath. 1 Inhaler 0  . amLODipine (NORVASC) 10 MG tablet TAKE ONE (1) TABLET BY MOUTH EVERY DAY 30 tablet 3  . CREON 24000-76000 units CPEP TAKE TWO CAPSULES BY MOUTH BEFORE MEALS 168 each 3  . DULoxetine (CYMBALTA) 30 MG capsule Take 1 capsule (30 mg total) by mouth 2 (two) times daily. 60 capsule 5  . ferrous sulfate 325 (65 FE) MG tablet TAKE ONE (1) TABLET BY MOUTH EVERY DAY WITH BREAKFAST (Patient taking differently: Take 325 mg by mouth daily with breakfast. ) 30 tablet 3  . gabapentin (NEURONTIN) 300 MG capsule TAKE ONE (1) CAPSULE BY MOUTH 4 TIMES DAILY AS NEEDED 180 capsule 3  . Gauze Pads & Dressings (BIOGUARD GAUZE SPONGES) 4"X4" PADS 1 each by Does not apply route 2 (two) times daily. 1 each 5  . hydrocortisone cream 0.5 % Apply 1 application topically 2 (two) times daily. 30 g 0  . lidocaine-prilocaine (EMLA) cream Apply 1 application topically as needed. 30 g 2  . mirtazapine (REMERON) 15 MG tablet TAKE ONE TABLET BY MOUTH EVERY EVENING 30 tablet 3  . nicotine (NICODERM CQ) 14 mg/24hr patch Place 1 patch (14  mg total) onto the skin daily. 28 patch 2  . Nutritional Supplements (FEEDING SUPPLEMENT, VITAL AF 1.2 CAL,) LIQD Place 1,000 mLs into feeding tube daily. At 46ml/hr    . ondansetron (ZOFRAN) 4 MG tablet Take 1 tablet (4 mg total) by mouth every 6 (six) hours as needed for nausea. 20 tablet 0  . Oxycodone HCl 10 MG TABS Take 1 tablet (10 mg total) by mouth every 6 (six) hours as needed. 60 tablet 0  . pantoprazole (PROTONIX) 40 MG tablet TAKE ONE (1) TABLET BY MOUTH EVERY DAY 28 tablet 3  . phenytoin (DILANTIN)  100 MG ER capsule Take 2 capsules (200 mg total) by mouth at bedtime. 60 capsule 5  . potassium chloride SA (K-DUR,KLOR-CON) 20 MEQ tablet Take 1 tablet (20 mEq total) by mouth 2 (two) times daily. 20 tablet 0  . prochlorperazine (COMPAZINE) 10 MG tablet Take 1 tablet (10 mg total) by mouth every 6 (six) hours as needed for nausea or vomiting. 30 tablet 0  . promethazine (PHENERGAN) 25 MG tablet Take 25 mg by mouth every 6 (six) hours as needed for nausea or vomiting.     . ranitidine (ZANTAC) 150 MG capsule TAKE 1 CAPSULE BY MOUTH TWICE DAILY (Patient not taking: Reported on 08/11/2016) 60 capsule 3  . sucralfate (CARAFATE) 1 GM/10ML suspension Take 10 mLs (1 g total) by mouth 4 (four) times daily -  with meals and at bedtime. 420 mL 0  . vitamin C (ASCORBIC ACID) 500 MG tablet Take 1 tablet (500 mg total) by mouth daily. 30 tablet 3  . Water For Irrigation, Sterile (FREE WATER) SOLN Place 200 mLs into feeding tube every 8 (eight) hours.    . Wound Dressings (SONAFINE EX) Apply topically.     No current facility-administered medications for this visit.     SURGICAL HISTORY:  Past Surgical History:  Procedure Laterality Date  . COLONOSCOPY WITH PROPOFOL N/A 06/26/2016   Procedure: COLONOSCOPY WITH PROPOFOL;  Surgeon: Rogene Houston, MD;  [facility];  Service: Endoscopy;  Laterality: N/A;  10:30  . CORONARY ANGIOPLASTY WITH STENT PLACEMENT    . ESOPHAGOGASTRODUODENOSCOPY N/A 01/18/2013   Procedure: ESOPHAGOGASTRODUODENOSCOPY (EGD);  Surgeon: Rogene Houston, MD;  [facility];  Service: Endoscopy;  Laterality: N/A;  . EUS  03/26/2009   [facility]  . GASTROSTOMY-JEJEUNOSTOMY TUBE CHANGE/PLACEMENT  12/11/2008   [facility]  . I&D EXTREMITY Left 09/12/2012   Procedure: IRRIGATION AND DEBRIDEMENT LEFT FIFTH FINGER;  Surgeon: Roseanne Kaufman, MD;  Location: [facility];  Service: Orthopedics;  Laterality: Left;  . IR FLUORO GUIDE PORT INSERTION RIGHT  08/31/2016  . IR US GUIDE  VASC ACCESS RIGHT  08/31/2016  . JEJUNOSTOMY FEEDING TUBE  07/13/2010  . NERVE AND TENDON REPAIR Left 09/12/2012   Procedure: NERVE AND TENDON REPAIR LEFT FIFTH FINGER;  Surgeon: Roseanne Kaufman, MD;  Location: [facility];  Service: Orthopedics;  Laterality: Left;  . OPEN REDUCTION INTERNAL FIXATION (ORIF) METACARPAL Left 09/12/2012   Procedure: OPEN REDUCTION INTERNAL FIXATION LEFT FIFTH FINGER;  Surgeon: Roseanne Kaufman, MD;  Location: [facility];  Service: Orthopedics;  Laterality: Left;  . PANCREATIC PSEUDOCYST DRAINAGE  07/13/2010  . PORTACATH PLACEMENT  11/28/2008   [facility]  . VIDEO BRONCHOSCOPY WITH ENDOBRONCHIAL ULTRASOUND N/A 07/24/2016   Procedure: VIDEO BRONCHOSCOPY WITH ENDOBRONCHIAL ULTRASOUND;  Surgeon: Grace Isaac, MD;  Location: [facility];  Service: Thoracic;  Laterality: N/A;    REVIEW OF SYSTEMS:  Constitutional: negative Eyes: negative Ears,  nose, mouth, throat, and face: negative Respiratory: negative Cardiovascular: negative Gastrointestinal: negative Genitourinary:negative Integument/breast: negative Hematologic/lymphatic: negative Musculoskeletal:negative Neurological: negative Behavioral/Psych: negative Endocrine: negative Allergic/Immunologic: negative   PHYSICAL EXAMINATION: General appearance: alert, cooperative and no distress Head: Normocephalic, without obvious abnormality, atraumatic Neck: no adenopathy, no JVD, supple, symmetrical, trachea midline and thyroid not enlarged, symmetric, no tenderness/mass/nodules Lymph nodes: Cervical, supraclavicular, and axillary nodes normal. Resp: clear to auscultation bilaterally Back: symmetric, no curvature. ROM normal. No CVA tenderness. Cardio: regular rate and rhythm, S1, S2 normal, no murmur, click, rub or gallop GI: soft, non-tender; bowel sounds normal; no masses,  no organomegaly Extremities: extremities normal, atraumatic, no cyanosis or edema Neurologic: Alert and oriented X 3, normal strength and tone. Normal  symmetric reflexes. Normal coordination and gait  ECOG PERFORMANCE STATUS: 1 - Symptomatic but completely ambulatory  Blood pressure 107/75, pulse 60, temperature 97.7 F (36.5 C), temperature source Oral, resp. rate 18, height 5\' 6"  (1.676 m), weight 102 lb 14.4 oz (46.7 kg), SpO2 100 %.  LABORATORY DATA: Lab Results  Component Value Date   WBC 3.1 (L) 09/22/2016   HGB 10.3 (L) 09/22/2016   HCT 30.2 (L) 09/22/2016   MCV 95.9 09/22/2016   PLT 138 (L) 09/22/2016      Chemistry      Component Value Date/Time   NA 141 09/15/2016 0756   K 2.8 (LL) 09/15/2016 0756   CL 110 07/24/2016 0717   CO2 25 09/15/2016 0756   BUN 5.9 (L) 09/15/2016 0756   CREATININE 0.7 09/15/2016 0756      Component Value Date/Time   CALCIUM 9.2 09/15/2016 0756   ALKPHOS 116 09/15/2016 0756   AST 13 09/15/2016 0756   ALT 8 09/15/2016 0756   BILITOT 0.30 09/15/2016 0756       RADIOGRAPHIC STUDIES: Ct Chest W Contrast  Result Date: 09/18/2016 CLINICAL DATA:  Lung cancer EXAM: CT CHEST WITH CONTRAST TECHNIQUE: Multidetector CT imaging of the chest was performed during intravenous contrast administration. CONTRAST:  96mL ISOVUE-300 IOPAMIDOL (ISOVUE-300) INJECTION 61% COMPARISON:  07/23/2016 FINDINGS: Cardiovascular: The heart size is normal. No pericardial effusion. Coronary artery calcification is evident. Atherosclerotic calcification is noted in the wall of the thoracic aorta. Mediastinum/Nodes: Upper normal lymph nodes are identified in the mediastinum. 2.1 cm short axis AP window lymph node on the prior study has decreased to 0.9 cm today. No hilar lymphadenopathy. Circumferential wall thickening noted mid and distal esophagus. There is no axillary lymphadenopathy. Lungs/Pleura: Medial left upper lobe pulmonary nodule measured previously at 2.2 x 2.0 cm is now 0.8 x 1.2 cm. Other scattered tiny pulmonary nodules are again noted, stable in the interval in measuring 4 mm or less in size. No new or progressive  finding on today's study. Upper Abdomen: Left para-aortic lymph nodes are similar to prior measuring up to about 10 mm short axis. Diffuse calcification within the visualized pancreatic parenchyma suggest chronic pancreatitis. Granulomatous disease is noted in the spleen. Musculoskeletal: Bone windows reveal no worrisome lytic or sclerotic osseous lesions. IMPRESSION: 1. Interval decrease in size of the medial left upper lobe pulmonary lesion and mediastinal lymph nodes. 2. Stable appearance scattered tiny bilateral pulmonary nodules. 3.  Aortic Atherosclerois (ICD10-170.0) 4.  Emphysema. (QIO96-E95.9) Electronically Signed   By: Misty Stanley M.D.   On: 09/18/2016 09:34   Ir US Guide Vasc Access Right  Result Date: 08/31/2016 INDICATION: 56 year old with small cell lung cancer. Port-A-Cath needed for chemotherapy in treatment. EXAM: FLUOROSCOPIC AND ULTRASOUND GUIDED PLACEMENT OF A SUBCUTANEOUS  PORT COMPARISON:  None. MEDICATIONS: Ancef 2 g; The antibiotic was administered within an appropriate time interval prior to skin puncture. ANESTHESIA/SEDATION: Versed 2.0 mg IV; Fentanyl 100 mcg IV; Moderate Sedation Time:  31 minutes The patient was continuously monitored during the procedure by the interventional radiology nurse under my direct supervision. FLUOROSCOPY TIME:  30 seconds, 1.5 mGy COMPLICATIONS: None immediate. PROCEDURE: The procedure, risks, benefits, and alternatives were explained to the patient. Questions regarding the procedure were encouraged and answered. The patient understands and consents to the procedure. Patient was placed supine on the interventional table. Ultrasound confirmed a patent right internal jugular vein. The right chest and neck were cleaned with a skin antiseptic and a sterile drape was placed. Maximal barrier sterile technique was utilized including caps, mask, sterile gowns, sterile gloves, sterile drape, hand hygiene and skin antiseptic. The right neck was anesthetized with  1% lidocaine. Small incision was made in the right neck with a blade. Micropuncture set was placed in the right internal jugular vein with ultrasound guidance. The micropuncture wire was used for measurement purposes. The right chest was anesthetized with 1% lidocaine with epinephrine. #15 blade was used to make an incision and a subcutaneous port pocket was formed. [place] was assembled. Subcutaneous tunnel was formed with a stiff tunneling device. The port catheter was brought through the subcutaneous tunnel. The port was placed in the subcutaneous pocket. The micropuncture set was exchanged for a peel-away sheath. The catheter was placed through the peel-away sheath and the tip was positioned at the cavoatrial junction. Catheter placement was confirmed with fluoroscopy. The port was accessed and flushed with heparinized saline. The port pocket was closed using two layers of absorbable sutures and skin glue. The vein skin site was closed using a single layer of absorbable suture and skin glue. Sterile dressings were applied. Patient tolerated the procedure well without an immediate complication. Ultrasound and fluoroscopic images were taken and saved for this procedure. IMPRESSION: Placement of a subcutaneous port device. Electronically Signed   By: Markus Daft M.D.   On: 08/31/2016 11:50   Ir Fluoro Guide Port Insertion Right  Result Date: 08/31/2016 INDICATION: 56 year old with small cell lung cancer. Port-A-Cath needed for chemotherapy in treatment. EXAM: FLUOROSCOPIC AND ULTRASOUND GUIDED PLACEMENT OF A SUBCUTANEOUS PORT COMPARISON:  None. MEDICATIONS: Ancef 2 g; The antibiotic was administered within an appropriate time interval prior to skin puncture. ANESTHESIA/SEDATION: Versed 2.0 mg IV; Fentanyl 100 mcg IV; Moderate Sedation Time:  31 minutes The patient was continuously monitored during the procedure by the interventional radiology nurse under my direct supervision. FLUOROSCOPY TIME:  30  seconds, 1.5 mGy COMPLICATIONS: None immediate. PROCEDURE: The procedure, risks, benefits, and alternatives were explained to the patient. Questions regarding the procedure were encouraged and answered. The patient understands and consents to the procedure. Patient was placed supine on the interventional table. Ultrasound confirmed a patent right internal jugular vein. The right chest and neck were cleaned with a skin antiseptic and a sterile drape was placed. Maximal barrier sterile technique was utilized including caps, mask, sterile gowns, sterile gloves, sterile drape, hand hygiene and skin antiseptic. The right neck was anesthetized with 1% lidocaine. Small incision was made in the right neck with a blade. Micropuncture set was placed in the right internal jugular vein with ultrasound guidance. The micropuncture wire was used for measurement purposes. The right chest was anesthetized with 1% lidocaine with epinephrine. #15 blade was used to make an incision and a subcutaneous port pocket  was formed. [place] was assembled. Subcutaneous tunnel was formed with a stiff tunneling device. The port catheter was brought through the subcutaneous tunnel. The port was placed in the subcutaneous pocket. The micropuncture set was exchanged for a peel-away sheath. The catheter was placed through the peel-away sheath and the tip was positioned at the cavoatrial junction. Catheter placement was confirmed with fluoroscopy. The port was accessed and flushed with heparinized saline. The port pocket was closed using two layers of absorbable sutures and skin glue. The vein skin site was closed using a single layer of absorbable suture and skin glue. Sterile dressings were applied. Patient tolerated the procedure well without an immediate complication. Ultrasound and fluoroscopic images were taken and saved for this procedure. IMPRESSION: Placement of a subcutaneous port device. Electronically Signed   By: Markus Daft  M.D.   On: 08/31/2016 11:50    ASSESSMENT AND PLAN:  This is a very pleasant 56 years old white male with recently diagnosed limited stage small cell lung cancer (T1b, N2, M0) presented with left upper lobe lung nodule in addition to mediastinal lymphadenopathy diagnosed in July 2018. The patient is currently undergoing systemic chemotherapy with cisplatin and etoposide status post 2 cycles and has been tolerating this treatment fairly well. This is concurrent with radiation. He had repeat CT scan of the chest performed recently. I personally and independently reviewed the scan and discuss the results with the patient and his family member today. His scan showed improvement of his disease. I recommended for him to continue his current treatment with the same regimen and he will proceed with cycle #3 today. I will see him back for follow-up visit in 3 weeks for evaluation with the next cycle of his treatment. For COPD, he will continue his treatment with the inhaler. For pain management the patient is currently on oxycodone. The patient was advised to call immediately if he has any concerning symptoms in the interval. The patient voices understanding of current disease status and treatment options and is in agreement with the current care plan. All questions were answered. The patient knows to call the clinic with any problems, questions or concerns. We can certainly see the patient much sooner if necessary.  Disclaimer: This note was dictated with voice recognition software. Similar sounding words can inadvertently be transcribed and may not be corrected upon review.

## 2016-09-22 NOTE — Progress Notes (Signed)
Patient with emesis today 300cc and urine output of 100cc after 1.5 liter of IV fluid.  Reviewed with Dr Julien Nordmann and his creatinine and BUN within normal limits we will proceed with treatment.  Patient will return tomorrow for Day 2 Etoposide and will receive additional hydrating fluids.  Instructed Patient to take Compazine q 6 hours not just once day.  Also instructed patient on hydrating oral fluids tonight.  Patient and caregiver verbalized understanding.

## 2016-09-22 NOTE — Telephone Encounter (Signed)
Gave patient avs report and appointments for September and October  °

## 2016-09-23 ENCOUNTER — Ambulatory Visit: Payer: Medicaid Other

## 2016-09-23 ENCOUNTER — Ambulatory Visit (HOSPITAL_BASED_OUTPATIENT_CLINIC_OR_DEPARTMENT_OTHER): Payer: Medicaid Other

## 2016-09-23 ENCOUNTER — Ambulatory Visit
Admission: RE | Admit: 2016-09-23 | Discharge: 2016-09-23 | Disposition: A | Discharge status: Home / Self Care | Payer: Medicaid Other | Source: Ambulatory Visit | Attending: Radiation Oncology | Admitting: Radiation Oncology

## 2016-09-23 ENCOUNTER — Other Ambulatory Visit: Payer: Self-pay | Admitting: Medical Oncology

## 2016-09-23 VITALS — BP 113/55 | HR 58 | Temp 98.0°F | Resp 18

## 2016-09-23 DIAGNOSIS — C3412 Malignant neoplasm of upper lobe, left bronchus or lung: Secondary | ICD-10-CM | POA: Diagnosis present

## 2016-09-23 DIAGNOSIS — Z510 Encounter for antineoplastic radiation therapy: Secondary | ICD-10-CM | POA: Diagnosis not present

## 2016-09-23 MED ORDER — SODIUM CHLORIDE 0.9 % IV SOLN: Freq: Once | INTRAVENOUS | Status: AC

## 2016-09-23 MED ORDER — HEPARIN SOD (PORK) LOCK FLUSH 100 UNIT/ML IV SOLN
500.0000 [IU] | Freq: Once | INTRAVENOUS | Status: AC | PRN
  Administered 2016-09-23: 500 [IU]
  Filled 2016-09-23: qty 5

## 2016-09-23 MED ORDER — ETOPOSIDE CHEMO INJECTION 1 GM/50ML
120.0000 mg/m2 | Freq: Once | INTRAVENOUS | Status: AC
  Administered 2016-09-23: 170 mg via INTRAVENOUS
  Filled 2016-09-23: qty 8.5

## 2016-09-23 MED ORDER — DEXAMETHASONE SODIUM PHOSPHATE 10 MG/ML IJ SOLN
10.0000 mg | Freq: Once | INTRAMUSCULAR | Status: AC
  Administered 2016-09-23: 10 mg via INTRAVENOUS

## 2016-09-23 MED ORDER — PROCHLORPERAZINE MALEATE 10 MG PO TABS: 10.0000 mg | ORAL_TABLET | Freq: Four times a day (QID) | ORAL | 0 refills | Status: DC | PRN

## 2016-09-23 MED ORDER — PROCHLORPERAZINE MALEATE 10 MG PO TABS
ORAL_TABLET | ORAL | Status: AC
  Filled 2016-09-23: qty 1

## 2016-09-23 MED ORDER — PROCHLORPERAZINE MALEATE 10 MG PO TABS
10.0000 mg | ORAL_TABLET | Freq: Once | ORAL | Status: AC
  Administered 2016-09-23: 10 mg via ORAL

## 2016-09-23 MED ORDER — DEXAMETHASONE SODIUM PHOSPHATE 10 MG/ML IJ SOLN
INTRAMUSCULAR | Status: AC
  Filled 2016-09-23: qty 1

## 2016-09-23 MED ORDER — SODIUM CHLORIDE 0.9% FLUSH
10.0000 mL | INTRAVENOUS | Status: DC | PRN
  Administered 2016-09-23: 10 mL
  Filled 2016-09-23: qty 10

## 2016-09-23 MED ORDER — SODIUM CHLORIDE 0.9 % IV SOLN
INTRAVENOUS | Status: DC
  Administered 2016-09-23: 08:00:00 via INTRAVENOUS

## 2016-09-23 NOTE — Patient Instructions (Signed)
[facility] Discharge Instructions for Patients Receiving Chemotherapy  Today you received the following chemotherapy agents:  Etoposide  To help prevent nausea and vomiting after your treatment, we encourage you to take your nausea medication as prescribed.   If you develop nausea and vomiting that is not controlled by your nausea medication, call the clinic.   BELOW ARE SYMPTOMS THAT SHOULD BE REPORTED IMMEDIATELY:  *FEVER GREATER THAN 100.5 F  *CHILLS WITH OR WITHOUT FEVER  NAUSEA AND VOMITING THAT IS NOT CONTROLLED WITH YOUR NAUSEA MEDICATION  *UNUSUAL SHORTNESS OF BREATH  *UNUSUAL BRUISING OR BLEEDING  TENDERNESS IN MOUTH AND THROAT WITH OR WITHOUT PRESENCE OF ULCERS  *URINARY PROBLEMS  *BOWEL PROBLEMS  UNUSUAL RASH Items with * indicate a potential emergency and should be followed up as soon as possible.  Feel free to call the clinic you have any questions or concerns. The clinic phone number is [phone].  Please show the [facility] at check-in to the Emergency Department and triage nurse.

## 2016-09-24 ENCOUNTER — Ambulatory Visit
Admission: RE | Admit: 2016-09-24 | Discharge: 2016-09-24 | Disposition: A | Discharge status: Home / Self Care | Payer: Medicaid Other | Source: Ambulatory Visit | Attending: Radiation Oncology | Admitting: Radiation Oncology

## 2016-09-24 ENCOUNTER — Ambulatory Visit: Payer: Medicaid Other

## 2016-09-24 ENCOUNTER — Other Ambulatory Visit: Payer: Self-pay | Admitting: *Deleted

## 2016-09-24 ENCOUNTER — Ambulatory Visit (HOSPITAL_BASED_OUTPATIENT_CLINIC_OR_DEPARTMENT_OTHER): Payer: Medicaid Other

## 2016-09-24 VITALS — BP 117/67 | HR 66 | Temp 97.8°F | Resp 18

## 2016-09-24 DIAGNOSIS — E860 Dehydration: Secondary | ICD-10-CM

## 2016-09-24 DIAGNOSIS — C3412 Malignant neoplasm of upper lobe, left bronchus or lung: Secondary | ICD-10-CM | POA: Diagnosis present

## 2016-09-24 DIAGNOSIS — Z510 Encounter for antineoplastic radiation therapy: Secondary | ICD-10-CM | POA: Diagnosis not present

## 2016-09-24 MED ORDER — FAMOTIDINE IN NACL 20-0.9 MG/50ML-% IV SOLN
20.0000 mg | Freq: Once | INTRAVENOUS | Status: AC
  Administered 2016-09-24: 20 mg via INTRAVENOUS

## 2016-09-24 MED ORDER — OLANZAPINE 10 MG PO TBDP
10.0000 mg | ORAL_TABLET | Freq: Once | ORAL | Status: AC
  Administered 2016-09-24: 10 mg via ORAL
  Filled 2016-09-24: qty 1

## 2016-09-24 MED ORDER — OLANZAPINE 10 MG PO TABS: 10.0000 mg | ORAL_TABLET | Freq: Once | ORAL | Status: DC

## 2016-09-24 MED ORDER — SODIUM CHLORIDE 0.9 % IV SOLN
Freq: Once | INTRAVENOUS | Status: AC
  Administered 2016-09-24: 08:00:00 via INTRAVENOUS

## 2016-09-24 MED ORDER — SODIUM CHLORIDE 0.9 % IV SOLN
120.0000 mg/m2 | Freq: Once | INTRAVENOUS | Status: AC
  Administered 2016-09-24: 170 mg via INTRAVENOUS
  Filled 2016-09-24: qty 8.5

## 2016-09-24 MED ORDER — DEXAMETHASONE SODIUM PHOSPHATE 10 MG/ML IJ SOLN
INTRAMUSCULAR | Status: AC
  Filled 2016-09-24: qty 1

## 2016-09-24 MED ORDER — SODIUM CHLORIDE 0.9% FLUSH
10.0000 mL | INTRAVENOUS | Status: DC | PRN
  Administered 2016-09-24: 10 mL
  Filled 2016-09-24: qty 10

## 2016-09-24 MED ORDER — FAMOTIDINE IN NACL 20-0.9 MG/50ML-% IV SOLN
INTRAVENOUS | Status: AC
  Filled 2016-09-24: qty 50

## 2016-09-24 MED ORDER — DEXAMETHASONE SODIUM PHOSPHATE 10 MG/ML IJ SOLN
10.0000 mg | Freq: Once | INTRAMUSCULAR | Status: AC
  Administered 2016-09-24: 10 mg via INTRAVENOUS

## 2016-09-24 MED ORDER — HEPARIN SOD (PORK) LOCK FLUSH 100 UNIT/ML IV SOLN
500.0000 [IU] | Freq: Once | INTRAVENOUS | Status: AC | PRN
  Administered 2016-09-24: 500 [IU]
  Filled 2016-09-24: qty 5

## 2016-09-24 NOTE — Patient Instructions (Signed)
[facility] Discharge Instructions for Patients Receiving Chemotherapy  Today you received the following chemotherapy agents: Etoposide   To help prevent nausea and vomiting after your treatment, we encourage you to take your nausea medication as directed.    If you develop nausea and vomiting that is not controlled by your nausea medication, call the clinic.   BELOW ARE SYMPTOMS THAT SHOULD BE REPORTED IMMEDIATELY:  *FEVER GREATER THAN 100.5 F  *CHILLS WITH OR WITHOUT FEVER  NAUSEA AND VOMITING THAT IS NOT CONTROLLED WITH YOUR NAUSEA MEDICATION  *UNUSUAL SHORTNESS OF BREATH  *UNUSUAL BRUISING OR BLEEDING  TENDERNESS IN MOUTH AND THROAT WITH OR WITHOUT PRESENCE OF ULCERS  *URINARY PROBLEMS  *BOWEL PROBLEMS  UNUSUAL RASH Items with * indicate a potential emergency and should be followed up as soon as possible.  Feel free to call the clinic you have any questions or concerns. The clinic phone number is [phone].  Please show the [facility] at check-in to the Emergency Department and triage nurse.

## 2016-09-25 ENCOUNTER — Ambulatory Visit: Payer: Medicaid Other

## 2016-09-25 ENCOUNTER — Ambulatory Visit: Payer: Medicaid Other | Admitting: Family Medicine

## 2016-09-25 ENCOUNTER — Ambulatory Visit
Admission: RE | Admit: 2016-09-25 | Discharge: 2016-09-25 | Disposition: A | Discharge status: Home / Self Care | Payer: Medicaid Other | Source: Ambulatory Visit | Attending: Radiation Oncology | Admitting: Radiation Oncology

## 2016-09-25 DIAGNOSIS — Z510 Encounter for antineoplastic radiation therapy: Secondary | ICD-10-CM | POA: Diagnosis not present

## 2016-09-28 ENCOUNTER — Other Ambulatory Visit: Payer: Self-pay | Admitting: *Deleted

## 2016-09-28 ENCOUNTER — Telehealth: Payer: Self-pay | Admitting: *Deleted

## 2016-09-28 ENCOUNTER — Other Ambulatory Visit: Payer: Self-pay | Admitting: Internal Medicine

## 2016-09-28 ENCOUNTER — Ambulatory Visit: Payer: Medicaid Other

## 2016-09-28 ENCOUNTER — Ambulatory Visit
Admission: RE | Admit: 2016-09-28 | Discharge: 2016-09-28 | Disposition: A | Discharge status: Home / Self Care | Payer: Medicaid Other | Source: Ambulatory Visit | Attending: Radiation Oncology | Admitting: Radiation Oncology

## 2016-09-28 ENCOUNTER — Other Ambulatory Visit: Payer: Self-pay | Admitting: Oncology

## 2016-09-28 DIAGNOSIS — Z510 Encounter for antineoplastic radiation therapy: Secondary | ICD-10-CM | POA: Diagnosis not present

## 2016-09-28 NOTE — Telephone Encounter (Signed)
Oncology Nurse Navigator Documentation  Oncology Nurse Navigator Flowsheets 09/28/2016  Navigator Location [facility]  Navigator Encounter Type Telephone/I called to check on Marcus Brooks. He was a no show for infusion today.  I was unable to reach and left him a vm message to call with my name and phone number. I also updated him that he will be getting IV fluids tomorrow and will be getting a call for that appt.   I worked with [facility] on 09/25/16 to see if Mr. Celli could get IV fluids at home but due to his insurance, it is not covered and he can not afford co-pay.    Telephone Outgoing Call  Treatment Phase Treatment  Barriers/Navigation Needs Coordination of Care;Education  Education Other  Interventions Coordination of Care;Education  Coordination of Care Other  Education Method Verbal  Acuity Level 2  Time Spent with Patient 53

## 2016-09-29 ENCOUNTER — Telehealth: Payer: Self-pay

## 2016-09-29 ENCOUNTER — Other Ambulatory Visit (HOSPITAL_BASED_OUTPATIENT_CLINIC_OR_DEPARTMENT_OTHER): Payer: Medicaid Other

## 2016-09-29 ENCOUNTER — Other Ambulatory Visit: Payer: Self-pay | Admitting: Internal Medicine

## 2016-09-29 ENCOUNTER — Ambulatory Visit: Payer: Medicaid Other

## 2016-09-29 ENCOUNTER — Ambulatory Visit
Admission: RE | Admit: 2016-09-29 | Discharge: 2016-09-29 | Disposition: A | Discharge status: Home / Self Care | Payer: Medicaid Other | Source: Ambulatory Visit | Attending: Radiation Oncology | Admitting: Radiation Oncology

## 2016-09-29 DIAGNOSIS — C3412 Malignant neoplasm of upper lobe, left bronchus or lung: Secondary | ICD-10-CM

## 2016-09-29 DIAGNOSIS — R52 Pain, unspecified: Secondary | ICD-10-CM

## 2016-09-29 DIAGNOSIS — Z7189 Other specified counseling: Secondary | ICD-10-CM

## 2016-09-29 DIAGNOSIS — Z79891 Long term (current) use of opiate analgesic: Secondary | ICD-10-CM

## 2016-09-29 DIAGNOSIS — Z510 Encounter for antineoplastic radiation therapy: Secondary | ICD-10-CM | POA: Diagnosis not present

## 2016-09-29 DIAGNOSIS — Z5111 Encounter for antineoplastic chemotherapy: Secondary | ICD-10-CM

## 2016-09-29 LAB — COMPREHENSIVE METABOLIC PANEL
ALT: 9 U/L (ref 0–55)
AST: 12 U/L (ref 5–34)
Albumin: 3.3 g/dL — ABNORMAL LOW (ref 3.5–5.0)
Alkaline Phosphatase: 98 U/L (ref 40–150)
Anion Gap: 8 mEq/L (ref 3–11)
BILIRUBIN TOTAL: 0.52 mg/dL (ref 0.20–1.20)
BUN: 8.9 mg/dL (ref 7.0–26.0)
CO2: 28 mEq/L (ref 22–29)
Calcium: 8.9 mg/dL (ref 8.4–10.4)
Chloride: 103 mEq/L (ref 98–109)
Creatinine: 0.7 mg/dL (ref 0.7–1.3)
EGFR: >90 mL/min/{1.73_m2} (ref >90)
Glucose: 88 mg/dl (ref 70–140)
Potassium: 3.2 mEq/L — ABNORMAL LOW (ref 3.5–5.1)
Sodium: 139 mEq/L (ref 136–145)
TOTAL PROTEIN: 6.6 g/dL (ref 6.4–8.3)

## 2016-09-29 LAB — CBC WITH DIFFERENTIAL/PLATELET
BASO%: 0.9 % (ref 0.0–2.0)
Basophils Absolute: 0 10*3/uL (ref 0.0–0.1)
EOS%: 0.6 % (ref 0.0–7.0)
Eosinophils Absolute: 0 10*3/uL (ref 0.0–0.5)
HEMATOCRIT: 26.3 % — AB (ref 38.4–49.9)
HEMOGLOBIN: 9.1 g/dL — AB (ref 13.0–17.1)
LYMPH#: 0.6 10*3/uL — AB (ref 0.9–3.3)
LYMPH%: 17.1 % (ref 14.0–49.0)
MCH: 32.6 pg (ref 27.2–33.4)
MCHC: 34.6 g/dL (ref 32.0–36.0)
MCV: 94.3 fL (ref 79.3–98.0)
MONO#: 0.1 10*3/uL (ref 0.1–0.9)
MONO%: 1.4 % (ref 0.0–14.0)
NEUT%: 80 % — AB (ref 39.0–75.0)
NEUTROS ABS: 2.8 10*3/uL (ref 1.5–6.5)
Platelets: 110 10*3/uL — ABNORMAL LOW (ref 140–400)
RBC: 2.79 10*6/uL — ABNORMAL LOW (ref 4.20–5.82)
RDW: 14.6 % (ref 11.0–14.6)
WBC: 3.5 10*3/uL — AB (ref 4.0–10.3)

## 2016-09-29 LAB — MAGNESIUM: MAGNESIUM: 1.8 mg/dL (ref 1.5–2.5)

## 2016-09-29 MED ORDER — OXYCODONE HCL 10 MG PO TABS: 10.0000 mg | ORAL_TABLET | Freq: Four times a day (QID) | ORAL | 0 refills | Status: DC | PRN

## 2016-09-30 ENCOUNTER — Encounter: Payer: Self-pay | Admitting: *Deleted

## 2016-09-30 ENCOUNTER — Ambulatory Visit (HOSPITAL_BASED_OUTPATIENT_CLINIC_OR_DEPARTMENT_OTHER): Payer: Medicaid Other

## 2016-09-30 ENCOUNTER — Encounter: Payer: Self-pay | Admitting: Family Medicine

## 2016-09-30 ENCOUNTER — Ambulatory Visit (INDEPENDENT_AMBULATORY_CARE_PROVIDER_SITE_OTHER): Payer: Medicaid Other | Admitting: Family Medicine

## 2016-09-30 ENCOUNTER — Encounter: Payer: Self-pay | Admitting: Radiation Oncology

## 2016-09-30 VITALS — BP 116/60 | HR 64 | Temp 98.0°F | Resp 18

## 2016-09-30 VITALS — BP 99/60 | HR 63 | Temp 98.5°F | Resp 14 | Ht 66.0 in | Wt 101.0 lb

## 2016-09-30 DIAGNOSIS — F17200 Nicotine dependence, unspecified, uncomplicated: Secondary | ICD-10-CM | POA: Diagnosis not present

## 2016-09-30 DIAGNOSIS — G894 Chronic pain syndrome: Secondary | ICD-10-CM | POA: Diagnosis not present

## 2016-09-30 DIAGNOSIS — E441 Mild protein-calorie malnutrition: Secondary | ICD-10-CM

## 2016-09-30 DIAGNOSIS — E860 Dehydration: Secondary | ICD-10-CM

## 2016-09-30 DIAGNOSIS — I10 Essential (primary) hypertension: Secondary | ICD-10-CM | POA: Diagnosis not present

## 2016-09-30 DIAGNOSIS — G40909 Epilepsy, unspecified, not intractable, without status epilepticus: Secondary | ICD-10-CM | POA: Diagnosis not present

## 2016-09-30 DIAGNOSIS — E46 Unspecified protein-calorie malnutrition: Secondary | ICD-10-CM

## 2016-09-30 DIAGNOSIS — C3412 Malignant neoplasm of upper lobe, left bronchus or lung: Secondary | ICD-10-CM | POA: Diagnosis not present

## 2016-09-30 MED ORDER — SODIUM CHLORIDE 0.9% FLUSH
10.0000 mL | INTRAVENOUS | Status: DC | PRN
  Administered 2016-09-30: 10 mL
  Filled 2016-09-30: qty 10

## 2016-09-30 MED ORDER — ONDANSETRON HCL 4 MG/2ML IJ SOLN
8.0000 mg | Freq: Once | INTRAMUSCULAR | Status: AC
  Administered 2016-09-30: 8 mg via INTRAVENOUS

## 2016-09-30 MED ORDER — SODIUM CHLORIDE 0.9 % IV SOLN
Freq: Once | INTRAVENOUS | Status: AC
  Administered 2016-09-30: 09:00:00 via INTRAVENOUS

## 2016-09-30 MED ORDER — HEPARIN SOD (PORK) LOCK FLUSH 100 UNIT/ML IV SOLN
500.0000 [IU] | Freq: Once | INTRAVENOUS | Status: AC | PRN
  Administered 2016-09-30: 500 [IU]
  Filled 2016-09-30: qty 5

## 2016-09-30 MED ORDER — ONDANSETRON HCL 4 MG/2ML IJ SOLN
INTRAMUSCULAR | Status: AC
  Filled 2016-09-30: qty 4

## 2016-09-30 MED ORDER — SODIUM CHLORIDE 0.9 % IV SOLN: Freq: Once | INTRAVENOUS | Status: DC

## 2016-09-30 NOTE — Progress Notes (Signed)
  Radiation Oncology         [phone] ________________________________  Name: Marcus Brooks MRN: [id number]  Date: 09/30/2016  DOB: 1960-05-15  End of Treatment Note  Diagnosis:   Limited stage,  stage IIIA (T1b N2 M0) small cell lung cancer     Indication for treatment:  Curative       Radiation treatment dates:   08/10/2016-09/29/2016  Site/dose:   Left lung, 2 Gy in 30 fractions  Beams/energy:   3D, 6X and 10X  Narrative: The patient tolerated radiation treatment relatively well. At the beginning of radiation treatment, pt reported lower back pain, abdominal pain, intermittent productive cough x yellow sputum, and fatigue. At the end of radiation treatment, he reported sore throat, trouble swallowing, nausea, and vomiting.    Plan: The patient has completed radiation treatment. The patient will return to radiation oncology clinic for routine followup in one month. I advised them to call or return sooner if they have any questions or concerns related to their recovery or treatment.  -----------------------------------  Blair Promise, PhD, MD  This document serves as a record of services personally performed by Gery Pray, MD. It was created on her behalf by Steva Colder, a trained medical scribe. The creation of this record is based on the scribe's personal observations and the provider's statements to them. This document has been checked and approved by the attending provider.

## 2016-09-30 NOTE — Progress Notes (Signed)
Subjective:    Patient ID: Marcus Brooks, male    DOB: 03/28/1960, 56 y.o.   MRN: [id number]  HPI Marcus Brooks, a 56 year old male with a history of seizure disorder, small cell lung cancer, and chronic pain presents for follow of chronic pain. Current pain intensity is 8/10 and constant. He has been taking Oxycodone 10 mg every 6 hours as needed for moderate to severe pain. He has also been referred to pain management. Mr. Delaughter has had sustained relief on pain medication regimen. He has also been referred to pain management. Patient has limited stage stage small cell lung cancer diagnosed in July 2018. He is currently on systemic chemotherapy with Cisplatin and is status post 2 cycles. He has been tolerating chemotherapy and radiation well. Patient also has a history of seizure disorder and takes Phenytoin consistently. There is a potential interaction between Cisplatin and Phenytoin. Cisplatin can potentially lower concentrations. He has had seizure disorder since childhood. He endorses pain and fatigue. He denies fever, shortness of breath, chest pain, nausea, vomiting or diarrhea.    Immunization History  Administered Date(s) Administered  . Influenza,inj,Quad PF,6+ Mos 01/10/2013  . PPD Test 01/27/2013  . Pneumococcal Polysaccharide-23 06/11/2015  . Tdap 09/12/2012   Past Surgical History:  Procedure Laterality Date  . COLONOSCOPY WITH PROPOFOL N/A 06/26/2016   Procedure: COLONOSCOPY WITH PROPOFOL;  Surgeon: Rogene Houston, MD;  [facility];  Service: Endoscopy;  Laterality: N/A;  10:30  . CORONARY ANGIOPLASTY WITH STENT PLACEMENT    . ESOPHAGOGASTRODUODENOSCOPY N/A 01/18/2013   Procedure: ESOPHAGOGASTRODUODENOSCOPY (EGD);  Surgeon: Rogene Houston, MD;  [facility];  Service: Endoscopy;  Laterality: N/A;  . EUS  03/26/2009   [facility]  . GASTROSTOMY-JEJEUNOSTOMY TUBE CHANGE/PLACEMENT  12/11/2008   [facility]  . I&D EXTREMITY Left 09/12/2012   Procedure: IRRIGATION AND DEBRIDEMENT LEFT FIFTH FINGER;  Surgeon: Roseanne Kaufman, MD;  Location: [place];  Service: Orthopedics;  Laterality: Left;  . IR FLUORO GUIDE PORT INSERTION RIGHT  08/31/2016  . IR US GUIDE VASC ACCESS RIGHT  08/31/2016  . JEJUNOSTOMY FEEDING TUBE  07/13/2010  . NERVE AND TENDON REPAIR Left 09/12/2012   Procedure: NERVE AND TENDON REPAIR LEFT FIFTH FINGER;  Surgeon: Roseanne Kaufman, MD;  Location: [place];  Service: Orthopedics;  Laterality: Left;  . OPEN REDUCTION INTERNAL FIXATION (ORIF) METACARPAL Left 09/12/2012   Procedure: OPEN REDUCTION INTERNAL FIXATION LEFT FIFTH FINGER;  Surgeon: Roseanne Kaufman, MD;  Location: [place];  Service: Orthopedics;  Laterality: Left;  . PANCREATIC PSEUDOCYST DRAINAGE  07/13/2010  . PORTACATH PLACEMENT  11/28/2008   FLEISHMAN  . VIDEO BRONCHOSCOPY WITH ENDOBRONCHIAL ULTRASOUND N/A 07/24/2016   Procedure: VIDEO BRONCHOSCOPY WITH ENDOBRONCHIAL ULTRASOUND;  Surgeon: Grace Isaac, MD;  Location: [place];  Service: Thoracic;  Laterality: N/A;    Review of Systems  Constitutional: Negative.  Negative for fatigue and fever.  HENT: Negative.   Eyes: Negative.  Negative for photophobia.  Respiratory: Negative.  Negative for chest tightness.   Cardiovascular: Negative.  Negative for palpitations and leg swelling.  Gastrointestinal: Positive for abdominal pain.  Endocrine: Negative.  Negative for polydipsia, polyphagia and polyuria.  Genitourinary: Negative.   Musculoskeletal: Positive for back pain.  Skin: Negative.   Allergic/Immunologic: Negative.  Negative for immunocompromised state.  Hematological: Negative.        Objective:   Physical Exam  Constitutional: He is oriented to person, place, and time. He appears well-developed. He appears cachectic.  He has a sickly appearance.  HENT:  Head: Normocephalic and atraumatic.  Right Ear: External ear normal.  Left Ear: External ear normal.  Nose: Nose normal.  Mouth/Throat: Oropharynx is  clear and moist. Abnormal dentition (partially edentulous).  Eyes: Pupils are equal, round, and reactive to light. Conjunctivae and EOM are normal.  Neck: Trachea normal and normal range of motion. Neck supple.  Cardiovascular: Normal rate, regular rhythm, normal heart sounds and intact distal pulses.   Pulmonary/Chest: Effort normal and breath sounds normal.  Abdominal: Soft. Bowel sounds are normal. There is tenderness in the right upper quadrant and left upper quadrant.  LUG PEG tube  Musculoskeletal: Normal range of motion.  Neurological: He is oriented to person, place, and time. He has normal reflexes. He is unresponsive.  Skin: Skin is warm, dry and intact. No rash noted. No cyanosis. There is pallor. Nails show no clubbing.  Psychiatric: His speech is normal and behavior is normal. Thought content normal. He exhibits a depressed mood.      BP 99/60 (BP Location: Left Arm, Patient Position: Sitting, Cuff Size: Normal)   Pulse 63   Temp 98.5 F (36.9 C) (Oral)   Resp 14   Ht 5\' 6"  (1.676 m)   Wt 101 lb (45.8 kg)   SpO2 99%   BMI 16.30 kg/m  Assessment & Plan:   1. Seizure disorder [facility]) Patient will continue Phenytoin as prescribed He is also scheduled to follow up with neurology for further workup and evaluation of seizure disorder - Phenytoin level, total  2. Chronic pain syndrome Referral has been sent to pain management. Acute and chronic painful episodes - We agreed on Oxycodone. We discussed that pt is to receive his Schedule II prescriptions here. Pt is also aware that the prescription history is available to [place] online through the [facility] CSRS. Controlled substance agreement signed 06/02/2016. We reminded [facility] that all patients receiving Schedule II narcotics must be seen for follow-up within one month of prescription being requested. We reviewed the terms of our pain agreement, including the need to keep medicines in a safe locked location away from children or pets, and the  need to report excess sedation or constipation, measures to avoid constipation, and policies related to early refills and stolen prescriptions. According to the [place] [facility] program, we have reviewed details related to analgesia, adverse effects, aberrant behaviors.   3. Tobacco use disorder Smoking cessation instruction/counseling given:  counseled patient on the dangers of tobacco use, advised patient to stop smoking, and reviewed strategies to maximize success  4. Essential hypertension Blood pressure is decreased, will decrease Amlodipine to 5 mg daily.   RTC: 1 month for chronic conditions   Sabana Eneas  MSN, FNP-C [place] [address] [place], [place] [address] [phone]

## 2016-09-30 NOTE — Progress Notes (Signed)
[place] Work  Clinical Social Work was referred by thoracic navigator for follow up.  Clinical Social Worker met with patient and patietnt's fiance in infusion room to offer support and assess for needs.  Marcus Brooks reported he had completed radiation treatment and was relieved to not have as many appointments at the cancer center.  He stated he is feeling fatigued, but overall feels he has "handled treatment well".  CSW explored patient's current living situation- Marcus Brooks shared he has asked his roommate to move out by the end of the month and feels confident this will be a much better situation for him.  CSW encouraged patient to call with any additional questions or concerns.   Maryjean Morn, MSW, LCSW, OSW-C Clinical Social Worker [facility] [phone]

## 2016-09-30 NOTE — Patient Instructions (Signed)
Dehydration, Adult Dehydration is when there is not enough fluid or water in your body. This happens when you lose more fluids than you take in. Dehydration can range from mild to very bad. It should be treated right away to keep it from getting very bad. Symptoms of mild dehydration may include:  Thirst.  Dry lips.  Slightly dry mouth.  Dry, warm skin.  Dizziness. Symptoms of moderate dehydration may include:  Very dry mouth.  Muscle cramps.  Dark pee (urine). Pee may be the color of tea.  Your body making less pee.  Your eyes making fewer tears.  Heartbeat that is uneven or faster than normal (palpitations).  Headache.  Light-headedness, especially when you stand up from sitting.  Fainting (syncope). Symptoms of very bad dehydration may include:  Changes in skin, such as: ? Cold and clammy skin. ? Blotchy (mottled) or pale skin. ? Skin that does not quickly return to normal after being lightly pinched and let go (poor skin turgor).  Changes in body fluids, such as: ? Feeling very thirsty. ? Your eyes making fewer tears. ? Not sweating when body temperature is high, such as in hot weather. ? Your body making very little pee.  Changes in vital signs, such as: ? Weak pulse. ? Pulse that is more than 100 beats a minute when you are sitting still. ? Fast breathing. ? Low blood pressure.  Other changes, such as: ? Sunken eyes. ? Cold hands and feet. ? Confusion. ? Lack of energy (lethargy). ? Trouble waking up from sleep. ? Short-term weight loss. ? Unconsciousness. Follow these instructions at home:  If told by your doctor, drink an ORS: ? Make an ORS by using instructions on the package. ? Start by drinking small amounts, about  cup (120 mL) every 5-10 minutes. ? Slowly drink more until you have had the amount that your doctor said to have.  Drink enough clear fluid to keep your pee clear or pale yellow. If you were told to drink an ORS, finish the ORS  first, then start slowly drinking clear fluids. Drink fluids such as: ? Water. Do not drink only water by itself. Doing that can make the salt (sodium) level in your body get too low (hyponatremia). ? Ice chips. ? Fruit juice that you have added water to (diluted). ? Low-calorie sports drinks.  Avoid: ? Alcohol. ? Drinks that have a lot of sugar. These include high-calorie sports drinks, fruit juice that does not have water added, and soda. ? Caffeine. ? Foods that are greasy or have a lot of fat or sugar.  Take over-the-counter and prescription medicines only as told by your doctor.  Do not take salt tablets. Doing that can make the salt level in your body get too high (hypernatremia).  Eat foods that have minerals (electrolytes). Examples include bananas, oranges, potatoes, tomatoes, and spinach.  Keep all follow-up visits as told by your doctor. This is important. Contact a doctor if:  You have belly (abdominal) pain that: ? Gets worse. ? Stays in one area (localizes).  You have a rash.  You have a stiff neck.  You get angry or annoyed more easily than normal (irritability).  You are more sleepy than normal.  You have a harder time waking up than normal.  You feel: ? Weak. ? Dizzy. ? Very thirsty.  You have peed (urinated) only a small amount of very dark pee during 6-8 hours. Get help right away if:  You have symptoms of   very bad dehydration.  You cannot drink fluids without throwing up (vomiting).  Your symptoms get worse with treatment.  You have a fever.  You have a very bad headache.  You are throwing up or having watery poop (diarrhea) and it: ? Gets worse. ? Does not go away.  You have blood or something green (bile) in your throw-up.  You have blood in your poop (stool). This may cause poop to look black and tarry.  You have not peed in 6-8 hours.  You pass out (faint).  Your heart rate when you are sitting still is more than 100 beats a  minute.  You have trouble breathing. This information is not intended to replace advice given to you by your health care provider. Make sure you discuss any questions you have with your health care provider. Document Released: 10/25/2008 Document Revised: 07/19/2015 Document Reviewed: 02/22/2015 Elsevier Interactive Patient Education  2018 Elsevier Inc.  

## 2016-09-30 NOTE — Patient Instructions (Signed)
Steps to Quit Smoking Smoking tobacco can be bad for your health. It can also affect almost every organ in your body. Smoking puts you and people around you at risk for many serious long-lasting (chronic) diseases. Quitting smoking is hard, but it is one of the best things that you can do for your health. It is never too late to quit. What are the benefits of quitting smoking? When you quit smoking, you lower your risk for getting serious diseases and conditions. They can include:  Lung cancer or lung disease.  Heart disease.  Stroke.  Heart attack.  Not being able to have children (infertility).  Weak bones (osteoporosis) and broken bones (fractures).  If you have coughing, wheezing, and shortness of breath, those symptoms may get better when you quit. You may also get sick less often. If you are pregnant, quitting smoking can help to lower your chances of having a baby of low birth weight. What can I do to help me quit smoking? Talk with your doctor about what can help you quit smoking. Some things you can do (strategies) include:  Quitting smoking totally, instead of slowly cutting back how much you smoke over a period of time.  Going to in-person counseling. You are more likely to quit if you go to many counseling sessions.  Using resources and support systems, such as: ? Online chats with a counselor. ? Phone quitlines. ? Printed self-help materials. ? Support groups or group counseling. ? Text messaging programs. ? Mobile phone apps or applications.  Taking medicines. Some of these medicines may have nicotine in them. If you are pregnant or breastfeeding, do not take any medicines to quit smoking unless your doctor says it is okay. Talk with your doctor about counseling or other things that can help you.  Talk with your doctor about using more than one strategy at the same time, such as taking medicines while you are also going to in-person counseling. This can help make  quitting easier. What things can I do to make it easier to quit? Quitting smoking might feel very hard at first, but there is a lot that you can do to make it easier. Take these steps:  Talk to your family and friends. Ask them to support and encourage you.  Call phone quitlines, reach out to support groups, or work with a counselor.  Ask people who smoke to not smoke around you.  Avoid places that make you want (trigger) to smoke, such as: ? Bars. ? Parties. ? Smoke-break areas at work.  Spend time with people who do not smoke.  Lower the stress in your life. Stress can make you want to smoke. Try these things to help your stress: ? Getting regular exercise. ? Deep-breathing exercises. ? Yoga. ? Meditating. ? Doing a body scan. To do this, close your eyes, focus on one area of your body at a time from head to toe, and notice which parts of your body are tense. Try to relax the muscles in those areas.  Download or buy apps on your mobile phone or tablet that can help you stick to your quit plan. There are many free apps, such as QuitGuide from the CDC (Centers for Disease Control and Prevention). You can find more support from smokefree.gov and other websites.  This information is not intended to replace advice given to you by your health care provider. Make sure you discuss any questions you have with your health care provider. Document Released: 10/25/2008 Document   Revised: 08/27/2015 Document Reviewed: 05/15/2014 Elsevier Interactive Patient Education  [employer]. Seizure, Adult When you have a seizure:  Parts of your body may move.  How aware or awake (conscious) you are may change.  You may shake (convulse).  Some people have symptoms right before a seizure happens. These symptoms may include:  Fear.  Worry (anxiety).  Feeling like you are going to throw up (nausea).  Feeling like the room is spinning (vertigo).  Feeling like you saw or heard something  before (deja vu).  Odd tastes or smells.  Changes in vision, such as seeing flashing lights or spots.  Seizures usually last from 30 seconds to 2 minutes. Usually, they are not harmful unless they last a long time. Follow these instructions at home: Medicines  Take over-the-counter and prescription medicines only as told by your doctor.  Avoid anything that may keep your medicine from working, such as alcohol. Activity  Do not do any activities that would be dangerous if you had another seizure, like driving or swimming. Wait until your doctor approves.  If you live in the [place] ask your local DMV (department of motor vehicles) when you can drive.  Rest. Teaching others  Teach friends and family what to do when you have a seizure. They should: ? Lay you on the ground. ? Protect your head and body. ? Loosen any tight clothing around your neck. ? Turn you on your side. ? Stay with you until you are better. ? Not hold you down. ? Not put anything in your mouth. ? Know whether or not you need emergency care. General instructions  Contact your doctor each time you have a seizure.  Avoid anything that gives you seizures.  Keep a seizure diary. Write down: ? What you think caused each seizure. ? What you remember about each seizure.  Keep all follow-up visits as told by your doctor. This is important. Contact a doctor if:  You have another seizure.  You have seizures more often.  There is any change in what happens during your seizures.  You continue to have seizures with treatment.  You have symptoms of being sick or having an infection. Get help right away if:  You have a seizure: ? That lasts longer than 5 minutes. ? That is different than seizures you had before. ? That makes it harder to breathe. ? After you hurt your head.  After a seizure, you cannot speak or use a part of your body.  After a seizure, you are confused or have a bad headache.  You have  two or more seizures in a row.  You are having seizures more often.  You do not wake up right after a seizure.  You get hurt during a seizure. In an emergency:  These symptoms may be an emergency. Do not wait to see if the symptoms will go away. Get medical help right away. Call your local emergency services (911 in the [place] Do not drive yourself to the hospital. This information is not intended to replace advice given to you by your health care provider. Make sure you discuss any questions you have with your health care provider. Document Released: 06/17/2007 Document Revised: 09/11/2015 Document Reviewed: 09/11/2015 Elsevier Interactive Patient Education  2017 [employer].

## 2016-10-01 ENCOUNTER — Other Ambulatory Visit: Payer: Self-pay | Admitting: Family Medicine

## 2016-10-01 ENCOUNTER — Telehealth: Payer: Self-pay

## 2016-10-01 DIAGNOSIS — G40909 Epilepsy, unspecified, not intractable, without status epilepticus: Secondary | ICD-10-CM

## 2016-10-01 LAB — PHENYTOIN LEVEL, TOTAL

## 2016-10-01 LAB — TIQ-NTM

## 2016-10-01 MED ORDER — PHENYTOIN SODIUM EXTENDED 300 MG PO CAPS: 300.0000 mg | ORAL_CAPSULE | Freq: Every day | ORAL | 0 refills | Status: DC

## 2016-10-01 NOTE — Progress Notes (Signed)
Called today at 1:23pm. No answer. Left a message for patient to call back. Thanks!

## 2016-10-01 NOTE — Progress Notes (Signed)
Reviewed dilantin, level is subtherapeutic. Will increase dilantin to 300 mg at bedtime.   Meds ordered this encounter  Medications  . phenytoin (DILANTIN) 300 MG ER capsule    Sig: Take 1 capsule (300 mg total) by mouth at bedtime.    Dispense:  30 capsule    Refill:  0    Donia Pounds  MSN, FNP-C [facility] [place] [address] [place], [place] [address] [phone]

## 2016-10-01 NOTE — Telephone Encounter (Signed)
Called, no answer. Left a message to call back to our office. Thanks!

## 2016-10-01 NOTE — Telephone Encounter (Signed)
-----   Message from [name], [place] sent at 10/01/2016  1:18 PM EDT ----- Dilantin level subtherapeutic, will increased Dilantin to 300 mg HS. Will repeat level in 1 month.    Thanks

## 2016-10-02 ENCOUNTER — Ambulatory Visit: Payer: Medicaid Other

## 2016-10-02 MED ORDER — PHENYTOIN SODIUM EXTENDED 300 MG PO CAPS: 300.0000 mg | ORAL_CAPSULE | Freq: Every day | ORAL | 0 refills | Status: DC

## 2016-10-02 NOTE — Addendum Note (Signed)
Addended by: Adelina Mings on: 10/02/2016 10:41 AM   Modules accepted: Orders

## 2016-10-02 NOTE — Progress Notes (Signed)
Spoke with patient and advised that dilantin needs to be increased to 300mg  once daily at bedtime. And that this has been sent to the pharmacy. Patient [facility] and this has been changed. Patient was asked to come back in 1 month for a recheck. Patient asked that his girlfriend call back and schedule this for him. Thanks!

## 2016-10-04 MED ORDER — AMLODIPINE BESYLATE 10 MG PO TABS: 5.0000 mg | ORAL_TABLET | Freq: Every day | ORAL | 3 refills | Status: DC

## 2016-10-05 ENCOUNTER — Telehealth: Payer: Self-pay | Admitting: Medical Oncology

## 2016-10-05 ENCOUNTER — Telehealth: Payer: Self-pay

## 2016-10-05 ENCOUNTER — Encounter: Payer: Self-pay | Admitting: *Deleted

## 2016-10-05 DIAGNOSIS — E860 Dehydration: Secondary | ICD-10-CM

## 2016-10-05 MED ORDER — SODIUM CHLORIDE 0.9 % IV SOLN: Freq: Once | INTRAVENOUS | Status: DC

## 2016-10-05 NOTE — Telephone Encounter (Signed)
Called, no answer. Left a message for patient to return call. Thanks!

## 2016-10-05 NOTE — Telephone Encounter (Signed)
-----   Message from [name], [place] sent at 10/04/2016  4:07 PM EDT ----- Reviewed blood pressure, have been decreased. Will decrease Amlodipine to 5 mg daily.  Will follow up in office as scheduled   Thanks

## 2016-10-05 NOTE — Telephone Encounter (Signed)
Lab cancelled . schedule request sent.

## 2016-10-05 NOTE — Progress Notes (Signed)
Oncology Nurse Navigator Documentation  Oncology Nurse Navigator Flowsheets 10/05/2016  Navigator Location [facility]  Navigator Encounter Type Other/per Dr. Julien Nordmann IVF ordered for dehydration.  Orders completed and scheduling notified to call patient with an appt tomorrow after labs.   Treatment Phase Treatment  Barriers/Navigation Needs Coordination of Care  Education Other  Interventions Coordination of Care  Coordination of Care Other  Acuity Level 2  Time Spent with Patient 30

## 2016-10-06 ENCOUNTER — Other Ambulatory Visit (HOSPITAL_BASED_OUTPATIENT_CLINIC_OR_DEPARTMENT_OTHER): Payer: Medicaid Other

## 2016-10-06 DIAGNOSIS — Z7189 Other specified counseling: Secondary | ICD-10-CM

## 2016-10-06 DIAGNOSIS — C3412 Malignant neoplasm of upper lobe, left bronchus or lung: Secondary | ICD-10-CM | POA: Diagnosis not present

## 2016-10-06 DIAGNOSIS — Z5111 Encounter for antineoplastic chemotherapy: Secondary | ICD-10-CM

## 2016-10-06 LAB — CBC WITH DIFFERENTIAL/PLATELET
BASO%: 1.7 % (ref 0.0–2.0)
Basophils Absolute: 0 10*3/uL (ref 0.0–0.1)
EOS%: 4 % (ref 0.0–7.0)
Eosinophils Absolute: 0.1 10*3/uL (ref 0.0–0.5)
HCT: 26.4 % — ABNORMAL LOW (ref 38.4–49.9)
HGB: 9.3 g/dL — ABNORMAL LOW (ref 13.0–17.1)
LYMPH%: 32.7 % (ref 14.0–49.0)
MCH: 33.5 pg — ABNORMAL HIGH (ref 27.2–33.4)
MCHC: 35.4 g/dL (ref 32.0–36.0)
MCV: 94.6 fL (ref 79.3–98.0)
MONO#: 0.4 10*3/uL (ref 0.1–0.9)
MONO%: 19.9 % — ABNORMAL HIGH (ref 0.0–14.0)
NEUT#: 0.8 10*3/uL — ABNORMAL LOW (ref 1.5–6.5)
NEUT%: 41.7 % (ref 39.0–75.0)
Platelets: 138 10*3/uL — ABNORMAL LOW (ref 140–400)
RBC: 2.79 10*6/uL — ABNORMAL LOW (ref 4.20–5.82)
RDW: 16.2 % — ABNORMAL HIGH (ref 11.0–14.6)
WBC: 1.9 10*3/uL — ABNORMAL LOW (ref 4.0–10.3)
lymph#: 0.6 10*3/uL — ABNORMAL LOW (ref 0.9–3.3)

## 2016-10-06 LAB — COMPREHENSIVE METABOLIC PANEL
ALT: <3 U/L (ref 0–55)
AST: 10 U/L (ref 5–34)
Albumin: 3.4 g/dL — ABNORMAL LOW (ref 3.5–5.0)
Alkaline Phosphatase: 116 U/L (ref 40–150)
Anion Gap: 9 mEq/L (ref 3–11)
BUN: 9.3 mg/dL (ref 7.0–26.0)
CO2: 28 mEq/L (ref 22–29)
Calcium: 9.3 mg/dL (ref 8.4–10.4)
Chloride: 102 mEq/L (ref 98–109)
Creatinine: 0.8 mg/dL (ref 0.7–1.3)
EGFR: >90 mL/min/{1.73_m2} (ref >90)
Glucose: 102 mg/dl (ref 70–140)
Potassium: 3.1 mEq/L — ABNORMAL LOW (ref 3.5–5.1)
Sodium: 139 mEq/L (ref 136–145)
Total Bilirubin: 0.46 mg/dL (ref 0.20–1.20)
Total Protein: 7.2 g/dL (ref 6.4–8.3)

## 2016-10-06 LAB — MAGNESIUM: Magnesium: 1.8 mg/dl (ref 1.5–2.5)

## 2016-10-06 NOTE — Telephone Encounter (Signed)
Called and spoke with patients girlfriend (approval on final) Advised that patient needs to decreased the amlodipine to 5mg  once daily. She verbalized understanding and will fix this in his pill box and alert him to the change. Thanks!

## 2016-10-09 ENCOUNTER — Encounter: Payer: Self-pay | Admitting: Neurology

## 2016-10-09 ENCOUNTER — Other Ambulatory Visit: Payer: Medicaid Other

## 2016-10-09 ENCOUNTER — Ambulatory Visit (INDEPENDENT_AMBULATORY_CARE_PROVIDER_SITE_OTHER): Payer: Medicaid Other | Admitting: Neurology

## 2016-10-09 VITALS — BP 92/54 | HR 68 | Ht 66.0 in | Wt 100.0 lb

## 2016-10-09 DIAGNOSIS — Z8673 Personal history of transient ischemic attack (TIA), and cerebral infarction without residual deficits: Secondary | ICD-10-CM | POA: Insufficient documentation

## 2016-10-09 DIAGNOSIS — G40209 Localization-related (focal) (partial) symptomatic epilepsy and epileptic syndromes with complex partial seizures, not intractable, without status epilepticus: Secondary | ICD-10-CM

## 2016-10-09 MED ORDER — PHENYTOIN SODIUM EXTENDED 300 MG PO CAPS: 300.0000 mg | ORAL_CAPSULE | Freq: Every day | ORAL | 11 refills | Status: AC
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
## 2016-10-13 ENCOUNTER — Ambulatory Visit: Payer: Medicaid Other

## 2016-10-13 ENCOUNTER — Other Ambulatory Visit: Payer: Self-pay | Admitting: Medical Oncology

## 2016-10-13 ENCOUNTER — Other Ambulatory Visit: Payer: Medicaid Other

## 2016-10-13 ENCOUNTER — Encounter: Payer: Medicaid Other | Admitting: Nutrition

## 2016-10-13 ENCOUNTER — Telehealth: Payer: Self-pay | Admitting: Medical Oncology

## 2016-10-13 ENCOUNTER — Ambulatory Visit: Payer: Medicaid Other | Admitting: Oncology

## 2016-10-13 DIAGNOSIS — C3412 Malignant neoplasm of upper lobe, left bronchus or lung: Secondary | ICD-10-CM

## 2016-10-13 LAB — PHENYTOIN LEVEL, FREE AND TOTAL: PHENYTOIN, FREE: 0.6 mg/L — AB (ref 1.0–2.0)

## 2016-10-13 NOTE — Telephone Encounter (Signed)
I called pt and told him he missed his appts today."I know,  I have been throwing up for 3 days ,six times a day". He says his is taking the antiemetic "but it is not working". He is drinking fluids but he says "I throw them up too". His voice is strong. I gave him lab, flush and smc appt for tomorrow.

## 2016-10-14 ENCOUNTER — Telehealth: Payer: Self-pay | Admitting: Medical Oncology

## 2016-10-14 ENCOUNTER — Other Ambulatory Visit: Payer: Medicaid Other

## 2016-10-14 ENCOUNTER — Ambulatory Visit: Payer: Medicaid Other | Admitting: Medical

## 2016-10-14 ENCOUNTER — Ambulatory Visit: Payer: Medicaid Other

## 2016-10-14 NOTE — Telephone Encounter (Addendum)
I left message that pt was supposed to be here around noon for labs. I also told Aseel he can get sicker very quickly with his vomiting and to call me back .

## 2016-10-14 NOTE — Telephone Encounter (Signed)
Spoke to [employer]. She said Marcus Brooks is having diarrhea and not feeling well . He is taking imodium. I told her to please call him and tell him to go to ED or come here for appt.today. SHe saisd she will callhim and call me back with his answer.

## 2016-10-15 ENCOUNTER — Encounter (HOSPITAL_COMMUNITY): Payer: Self-pay | Admitting: Emergency Medicine

## 2016-10-15 ENCOUNTER — Emergency Department (HOSPITAL_COMMUNITY): Payer: Medicaid Other

## 2016-10-15 ENCOUNTER — Emergency Department (HOSPITAL_COMMUNITY)
Admission: EM | Admit: 2016-10-15 | Discharge: 2016-10-15 | Disposition: A | Discharge status: Home / Self Care | Payer: Medicaid Other | Attending: Emergency Medicine | Admitting: Emergency Medicine

## 2016-10-15 ENCOUNTER — Ambulatory Visit: Payer: Medicaid Other

## 2016-10-15 DIAGNOSIS — Z955 Presence of coronary angioplasty implant and graft: Secondary | ICD-10-CM | POA: Diagnosis not present

## 2016-10-15 DIAGNOSIS — Z79899 Other long term (current) drug therapy: Secondary | ICD-10-CM | POA: Diagnosis not present

## 2016-10-15 DIAGNOSIS — R112 Nausea with vomiting, unspecified: Secondary | ICD-10-CM | POA: Diagnosis not present

## 2016-10-15 DIAGNOSIS — R197 Diarrhea, unspecified: Secondary | ICD-10-CM | POA: Insufficient documentation

## 2016-10-15 DIAGNOSIS — R1012 Left upper quadrant pain: Secondary | ICD-10-CM | POA: Insufficient documentation

## 2016-10-15 DIAGNOSIS — C3412 Malignant neoplasm of upper lobe, left bronchus or lung: Secondary | ICD-10-CM | POA: Diagnosis not present

## 2016-10-15 DIAGNOSIS — I2510 Atherosclerotic heart disease of native coronary artery without angina pectoris: Secondary | ICD-10-CM | POA: Diagnosis not present

## 2016-10-15 DIAGNOSIS — Z91040 Latex allergy status: Secondary | ICD-10-CM | POA: Diagnosis not present

## 2016-10-15 DIAGNOSIS — F17210 Nicotine dependence, cigarettes, uncomplicated: Secondary | ICD-10-CM | POA: Insufficient documentation

## 2016-10-15 DIAGNOSIS — E876 Hypokalemia: Secondary | ICD-10-CM | POA: Insufficient documentation

## 2016-10-15 LAB — CBC
HEMATOCRIT: 27.8 % — AB (ref 39.0–52.0)
HEMOGLOBIN: 9.7 g/dL — AB (ref 13.0–17.0)
MCH: 33.1 pg (ref 26.0–34.0)
MCHC: 34.9 g/dL (ref 30.0–36.0)
MCV: 94.9 fL (ref 78.0–100.0)
Platelets: 210 10*3/uL (ref 150–400)
RBC: 2.93 MIL/uL — ABNORMAL LOW (ref 4.22–5.81)
RDW: 15.8 % — ABNORMAL HIGH (ref 11.5–15.5)
WBC: 6.8 10*3/uL (ref 4.0–10.5)

## 2016-10-15 LAB — COMPREHENSIVE METABOLIC PANEL
ALBUMIN: 3.3 g/dL — AB (ref 3.5–5.0)
ALK PHOS: 124 U/L (ref 38–126)
ALT: 13 U/L — ABNORMAL LOW (ref 17–63)
ANION GAP: 10 (ref 5–15)
AST: 22 U/L (ref 15–41)
BUN: 6 mg/dL (ref 6–20)
CALCIUM: 8.5 mg/dL — AB (ref 8.9–10.3)
CO2: 27 mmol/L (ref 22–32)
Chloride: 104 mmol/L (ref 101–111)
Creatinine, Ser: 0.73 mg/dL (ref 0.61–1.24)
GFR calc Af Amer: >60 mL/min (ref >60)
GFR calc non Af Amer: >60 mL/min (ref >60)
GLUCOSE: 118 mg/dL — AB (ref 65–99)
POTASSIUM: 2.9 mmol/L — AB (ref 3.5–5.1)
SODIUM: 141 mmol/L (ref 135–145)
Total Bilirubin: 0.6 mg/dL (ref 0.3–1.2)
Total Protein: 6.7 g/dL (ref 6.5–8.1)

## 2016-10-15 LAB — LIPASE, BLOOD: Lipase: 18 U/L (ref 11–51)

## 2016-10-15 LAB — I-STAT CG4 LACTIC ACID, ED: Lactic Acid, Venous: 1.52 mmol/L (ref 0.5–1.9)

## 2016-10-15 MED ORDER — POTASSIUM CHLORIDE 10 MEQ/100ML IV SOLN
10.0000 meq | Freq: Once | INTRAVENOUS | Status: AC
  Administered 2016-10-15: 10 meq via INTRAVENOUS
  Filled 2016-10-15: qty 100

## 2016-10-15 MED ORDER — HEPARIN SOD (PORK) LOCK FLUSH 100 UNIT/ML IV SOLN
500.0000 [IU] | Freq: Once | INTRAVENOUS | Status: AC
  Administered 2016-10-15: 500 [IU]
  Filled 2016-10-15: qty 5

## 2016-10-15 MED ORDER — SODIUM CHLORIDE 0.9 % IV BOLUS (SEPSIS)
1000.0000 mL | Freq: Once | INTRAVENOUS | Status: AC
  Administered 2016-10-15: 1000 mL via INTRAVENOUS

## 2016-10-15 MED ORDER — ONDANSETRON HCL 4 MG/2ML IJ SOLN
4.0000 mg | Freq: Once | INTRAMUSCULAR | Status: AC
  Administered 2016-10-15: 4 mg via INTRAVENOUS
  Filled 2016-10-15: qty 2

## 2016-10-15 MED ORDER — POTASSIUM CHLORIDE CRYS ER 20 MEQ PO TBCR: 20.0000 meq | EXTENDED_RELEASE_TABLET | Freq: Two times a day (BID) | ORAL | 0 refills | Status: DC

## 2016-10-15 MED ORDER — MORPHINE SULFATE (PF) 4 MG/ML IV SOLN
4.0000 mg | Freq: Once | INTRAVENOUS | Status: AC
  Administered 2016-10-15: 4 mg via INTRAVENOUS
  Filled 2016-10-15: qty 1

## 2016-10-15 MED ORDER — MORPHINE SULFATE (PF) 4 MG/ML IV SOLN
6.0000 mg | Freq: Once | INTRAVENOUS | Status: AC
  Administered 2016-10-15: 6 mg via INTRAVENOUS
  Filled 2016-10-15: qty 2

## 2016-10-15 NOTE — ED Provider Notes (Signed)
[facility] DEPT Provider Note   CSN: [id number] Arrival date & time: 10/15/16  1313     History   Chief Complaint Chief Complaint  Patient presents with  . Generalized Body Aches  . Cancer    HPI Marcus Brooks is a 56 y.o. male.  HPI   56 year old male with a past medical history of alcohol abuse, tobacco abuse, chronic pancreatitis,small lung cancer SP radiation currently on cisplatin/ etoposide  presents today with complaints of abdominal pain. Patient reports approximately 2 days ago he developed sharp constant pain in his left upper quadrant radiating to his back. Patient notes significant past medical history pancreatitis, notes that this feels very similar. He describes pain as 10 out of 10 not improved with home medications. He does admit that after taking his home medications including oxycodone and 3 different types of antibiotics he vomited these up. Patient reports he was scheduled for chemotherapy today at the cancer center but was told to come to the emergency room for evaluation. Patient notes associated nausea vomiting and diarrhea, all nonbloody. He denies any close sick contacts, denies any fever, denies any other pain.   Past Medical History:  Diagnosis Date  . Anxiety states 08/04/2010  . Chronic airway obstruction, not elsewhere classified 08/04/2010  . Chronic pain syndrome 08/04/2010  . Chronic pancreatitis ([place]) 08/04/2010  . Coronary artery disease   . GERD (gastroesophageal reflux disease)   . Hiatal hernia    EGD 01/18/13  . Insomnia 08/04/2010  . Intellectual disability   . Lung nodule 05/20/2016  . Other abnormal glucose 08/04/2010  . Pancreatitis chronic 08/04/2010  . Protein calorie malnutrition ([place]) 2010  . Seizure ([place]) 08/04/2010  . Severe anemia 05/20/2016  . 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 Splenic vein thrombosis    Chronic.  . Tobacco use disorder 08/04/2010  . Unspecified essential hypertension  08/04/2010    Patient Active Problem List   Diagnosis Date Noted  . Localization-related (focal) (partial) symptomatic epilepsy and epileptic syndromes with complex partial seizures, not intractable, without status epilepticus ([place]) 10/09/2016  . History of stroke 10/09/2016  . Dehydration 09/24/2016  . 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 Encounter for antineoplastic chemotherapy 07/30/2016  . Goals of care, counseling/discussion 07/30/2016  . Lung nodule 05/20/2016  . Symptomatic anemia 05/20/2016  . Pancreatic lesion   . Malnutrition ([place])   . Phlegmon of pancreas 05/09/2016  . Acute on chronic pancreatitis ([place]) 05/09/2016  . Lymphadenopathy 05/09/2016  . Hx of colonic polyps 04/23/2016  . Atherosclerosis of aorta ([place]) 04/09/2016  . History of colonic polyps 02/19/2016  . Dizziness 06/10/2015  . Pancreatic pseudocyst 03/13/2013  . Cellulitis 01/24/2013  . Gastritis 01/24/2013  . Pancreatitis 01/23/2013  . Irritation around percutaneous endoscopic gastrostomy (PEG) tube site ([place]) 01/23/2013  . Hyponatremia 01/19/2013  . Hiatal hernia 01/19/2013  . Acute blood loss anemia 01/18/2013  . 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 Coronary artery disease   . Upper GI bleed 01/17/2013  . Chronic pancreatitis ([place]) 01/17/2013  . Protein-calorie malnutrition, severe ([place]) 01/10/2013  . Hematemesis 01/09/2013  . Pancreatitis, acute 01/09/2013  . Seizure disorder ([place]) 01/09/2013  . Syncope 01/09/2013  . Acute pancreatitis 10/03/2012  . Leukocytosis 10/03/2012  . Transaminitis 10/03/2012  . Chest pain 10/03/2012  . Pain around PEG tube site 03/09/2011  . Hypertension 11/05/2010  . GERD (gastroesophageal reflux disease) 11/05/2010  . Chronic pain syndrome 08/04/2010  .  Tobacco use disorder 08/04/2010    Past Surgical History:  Procedure Laterality Date  . COLONOSCOPY WITH PROPOFOL N/A 06/26/2016   Procedure: COLONOSCOPY WITH PROPOFOL;  Surgeon:  Rogene Houston, MD;  [facility];  Service: Endoscopy;  Laterality: N/A;  10:30  . CORONARY ANGIOPLASTY WITH STENT PLACEMENT    . ESOPHAGOGASTRODUODENOSCOPY N/A 01/18/2013   Procedure: ESOPHAGOGASTRODUODENOSCOPY (EGD);  Surgeon: Rogene Houston, MD;  [facility];  Service: Endoscopy;  Laterality: N/A;  . EUS  03/26/2009   [facility]  . GASTROSTOMY-JEJEUNOSTOMY TUBE CHANGE/PLACEMENT  12/11/2008   [facility]ICK  . I&D EXTREMITY Left 09/12/2012   Procedure: IRRIGATION AND DEBRIDEMENT LEFT FIFTH FINGER;  Surgeon: Roseanne Kaufman, MD;  Location: [facility];  Service: Orthopedics;  Laterality: Left;  . IR FLUORO GUIDE PORT INSERTION RIGHT  08/31/2016  . IR US GUIDE VASC ACCESS RIGHT  08/31/2016  . JEJUNOSTOMY FEEDING TUBE  07/13/2010  . NERVE AND TENDON REPAIR Left 09/12/2012   Procedure: NERVE AND TENDON REPAIR LEFT FIFTH FINGER;  Surgeon: Roseanne Kaufman, MD;  Location: [facility];  Service: Orthopedics;  Laterality: Left;  . OPEN REDUCTION INTERNAL FIXATION (ORIF) METACARPAL Left 09/12/2012   Procedure: OPEN REDUCTION INTERNAL FIXATION LEFT FIFTH FINGER;  Surgeon: Roseanne Kaufman, MD;  Location: [facility];  Service: Orthopedics;  Laterality: Left;  . PANCREATIC PSEUDOCYST DRAINAGE  07/13/2010  . PORTACATH PLACEMENT  11/28/2008   FLEISHMAN  . VIDEO BRONCHOSCOPY WITH ENDOBRONCHIAL ULTRASOUND N/A 07/24/2016   Procedure: VIDEO BRONCHOSCOPY WITH ENDOBRONCHIAL ULTRASOUND;  Surgeon: Grace Isaac, MD;  Location: [facility];  Service: Thoracic;  Laterality: N/A;       Home Medications    Prior to Admission medications   Medication Sig Start Date End Date Taking? Authorizing Provider  Adhesive Tape (CLOTH ADHESIVE SURG 1/2"X10YD) TAPE 1 each by Does not apply route 2 (two) times daily as needed. 03/09/16  Yes [name], FNP  albuterol (PROVENTIL HFA;VENTOLIN HFA) 108 (90 Base) MCG/ACT inhaler Inhale 2 puffs into the lungs every 6 (six) hours as needed for wheezing or shortness of breath.  09/01/16  Yes Curcio, Roselie Awkward, NP  amLODipine (NORVASC) 10 MG tablet Take 0.5 tablets (5 mg total) by mouth daily. Patient taking differently: Take 10 mg by mouth daily.  10/04/16  Yes [name], FNP  CREON [phone] units CPEP TAKE TWO CAPSULES BY MOUTH BEFORE MEALS 08/20/16  Yes [name], FNP  DULoxetine (CYMBALTA) 30 MG capsule Take 1 capsule (30 mg total) by mouth 2 (two) times daily. Patient taking differently: Take 30 mg by mouth daily.  07/28/16  Yes [name], FNP  ferrous sulfate 325 (65 FE) MG tablet TAKE ONE (1) TABLET BY MOUTH EVERY DAY WITH BREAKFAST Patient taking differently: Take 325 mg by mouth daily with breakfast.  06/19/16  Yes [name], FNP  gabapentin (NEURONTIN) 300 MG capsule TAKE ONE (1) CAPSULE BY MOUTH 4 TIMES DAILY AS NEEDED Patient taking differently: TAKE 300 mg three times daily BY MOUTH as needed for NERVE PAIN 08/20/16  Yes [name], FNP  Gauze Pads & Dressings (BIOGUARD GAUZE SPONGES) 4"X4" PADS 1 each by Does not apply route 2 (two) times daily. 03/09/16  Yes [name], FNP  lidocaine-prilocaine (EMLA) cream Apply 1 application topically as needed. 09/01/16  Yes Curcio, Roselie Awkward, NP  mirtazapine (REMERON) 15 MG tablet TAKE ONE TABLET BY MOUTH EVERY EVENING Patient taking differently: TAKE 15 MG TABLET BY MOUTH EVERY EVENING 09/17/16  Yes [name], FNP  nicotine (NICODERM CQ) 14 mg/24hr patch Place 1 patch (14 mg total) onto the skin daily. 08/27/16  Yes [name], FNP  ondansetron (ZOFRAN) 4 MG tablet Take 1 tablet (4 mg total) by mouth every 6 (six) hours as needed for nausea. 05/15/16  Yes Eugenie Filler, MD  Oxycodone HCl 10 MG TABS Take 1 tablet (10 mg total) by mouth every 6 (six) hours as needed. Patient taking differently: Take 10 mg by mouth every 6 (six) hours as needed (pain).  09/29/16  Yes Tresa Garter, MD  pantoprazole (PROTONIX) 40 MG tablet TAKE ONE (1) TABLET BY MOUTH EVERY  DAY Patient taking differently: take 40 mg tablet, by mouth, once daily 08/20/16  Yes [name], FNP  phenytoin (DILANTIN) 300 MG ER capsule Take 1 capsule (300 mg total) by mouth at bedtime. 10/09/16  Yes Cameron Sprang, MD  potassium chloride SA (K-DUR,KLOR-CON) 20 MEQ tablet Take 1 tablet (20 mEq total) by mouth 2 (two) times daily. 09/18/16 10/15/16 Yes Curt Bears, MD  prochlorperazine (COMPAZINE) 10 MG tablet Take 1 tablet (10 mg total) by mouth every 6 (six) hours as needed for nausea or vomiting. 09/23/16  Yes Curt Bears, MD  promethazine (PHENERGAN) 25 MG tablet Take 25 mg by mouth every 6 (six) hours as needed for nausea or vomiting.    Yes [provider]  ranitidine (ZANTAC) 150 MG capsule TAKE 1 CAPSULE BY MOUTH TWICE DAILY Patient taking differently: take 150 mg capsule, by mouth, twice daily 06/13/15  Yes Micheline Chapman, NP  sucralfate (CARAFATE) 1 GM/10ML suspension Take 10 mLs (1 g total) by mouth 4 (four) times daily -  with meals and at bedtime. 08/25/16  Yes Gery Pray, MD  vitamin C (ASCORBIC ACID) 500 MG tablet Take 1 tablet (500 mg total) by mouth daily. 11/20/15  Yes Micheline Chapman, NP  Water For Irrigation, Sterile (FREE WATER) SOLN Place 200 mLs into feeding tube every 8 (eight) hours. 05/15/16  Yes Eugenie Filler, MD  Wound Dressings (SONAFINE EX) Apply topically.   Yes [provider]  hydrocortisone cream 0.5 % Apply 1 application topically 2 (two) times daily. Patient not taking: Reported on 10/15/2016 02/28/16   [name], FNP  Nutritional Supplements (FEEDING SUPPLEMENT, VITAL AF 1.2 CAL,) LIQD Place 1,000 mLs into feeding tube daily. At 32ml/hr 05/16/16   Eugenie Filler, MD  potassium chloride SA (K-DUR,KLOR-CON) 20 MEQ tablet Take 1 tablet (20 mEq total) by mouth 2 (two) times daily. 10/15/16   Okey Regal, PA-C    Family History No family history on file.  Social History Social History  Substance Use Topics   . Smoking status: Current Every Day Smoker    Packs/day: 0.25    Years: 20.00    Types: Cigarettes  . Smokeless tobacco: Never Used     Comment: 1 pack a day since age 71  . Alcohol use No     Comment: No etoh since 5-6 yrs     Allergies   Latex   Review of Systems Review of Systems  All other systems reviewed and are negative.   Physical Exam Updated Vital Signs BP 133/68   Pulse 62   Temp 98.2 F (36.8 C) (Oral)   Resp 16   Ht 5\' 6"  (1.676 m)   Wt 45.4 kg (100 lb)   SpO2 97%   BMI 16.14 kg/m   Physical Exam  Constitutional: He is  oriented to person, place, and time. He appears well-developed and well-nourished.  HENT:  Head: Normocephalic and atraumatic.  Eyes: Pupils are equal, round, and reactive to light. Conjunctivae are normal. Right eye exhibits no discharge. Left eye exhibits no discharge. No scleral icterus.  Neck: Normal range of motion. No JVD present. No tracheal deviation present.  Pulmonary/Chest: Effort normal. No stridor.  Abdominal:  GJ tube without surrounding infection- TTP of LUQ- remainder of abd soft NTTP  Neurological: He is alert and oriented to person, place, and time. Coordination normal.  Psychiatric: He has a normal mood and affect. His behavior is normal. Judgment and thought content normal.  Nursing note and vitals reviewed.    ED Treatments / Results  Labs (all labs ordered are listed, but only abnormal results are displayed) Labs Reviewed  COMPREHENSIVE METABOLIC PANEL - Abnormal; Notable for the following:       Result Value   Potassium 2.9 (*)    Glucose, Bld 118 (*)    Calcium 8.5 (*)    Albumin 3.3 (*)    ALT 13 (*)    All other components within normal limits  CBC - Abnormal; Notable for the following:    RBC 2.93 (*)    Hemoglobin 9.7 (*)    HCT 27.8 (*)    RDW 15.8 (*)    All other components within normal limits  LIPASE, BLOOD  URINALYSIS, ROUTINE W REFLEX MICROSCOPIC  I-STAT CG4 LACTIC ACID, ED  I-STAT  CG4 LACTIC ACID, ED    EKG  EKG Interpretation  Date/Time:  Thursday October 15 2016 13:46:50 EDT Ventricular Rate:  62 PR Interval:    QRS Duration: 93 QT Interval:  426 QTC Calculation: 433 R Axis:   93 Text Interpretation:  Sinus rhythm Borderline right axis deviation Baseline wander in lead(s) V3 Confirmed by Davonna Belling [phone]) on 10/15/2016 4:29:48 PM       Radiology Dg Chest 2 View  Result Date: 10/15/2016 CLINICAL DATA:  Short of breath yesterday EXAM: CHEST  2 VIEW COMPARISON:  07/24/2016 FINDINGS: Right jugular Port-A-Cath placed. Tip is at the cavoatrial junction. Normal heart size. Lungs hyperaerated and clear. No pneumothorax. IMPRESSION: No active cardiopulmonary disease. Electronically Signed   By: Marybelle Killings M.D.   On: 10/15/2016 15:13    Procedures Procedures (including critical care time)  Medications Ordered in ED Medications  heparin lock flush 100 unit/mL (not administered)  morphine 4 MG/ML injection 6 mg (6 mg Intravenous Given 10/15/16 1427)  ondansetron (ZOFRAN) injection 4 mg (4 mg Intravenous Given 10/15/16 1427)  sodium chloride 0.9 % bolus 1,000 mL (0 mLs Intravenous Stopped 10/15/16 1507)  potassium chloride 10 mEq in 100 mL IVPB (10 mEq Intravenous New Bag/Given 10/15/16 1611)  morphine 4 MG/ML injection 4 mg (4 mg Intravenous Given 10/15/16 1702)     Initial Impression / Assessment and Plan / ED Course  I have reviewed the triage vital signs and the nursing notes.  Pertinent labs & imaging results that were available during my care of the patient were reviewed by me and considered in my medical decision making (see chart for details).      Final Clinical Impressions(s) / ED Diagnoses   Final diagnoses:  Left upper quadrant pain  Hypokalemia    Labs: Lactic acid, lipase, CMP, CBC  Imaging:  Consults:  Therapeutics: Morphine, normal saline, Zofran  Discharge Meds:   Assessment/Plan: 63 from male presents today with  complaints of abdominal pain nausea vomiting diarrhea. Patient has  a history of chronic abdominal pain secondary to pancreatitis. Patient reports this feels similar to previous. He was given antinausea medication pain medication here. He had no episodes of vomiting, tolerating by mouth. He was slightly hypokalemic which she's been in the past and is currently taking potassium at home. This is likely secondary to ongoing GI losses. Patient well-appearing in no acute distress. He has no signs of infectious etiology. He will be discharged with instructions to continue supplementing potassium, use home anti-emetics and pain medicine as needed, follow up with his primary care if symptoms persist return to the emergency room if they worsen. He verbalized understanding and agreement to today's plan had no further questions or concerns at time of discharge.     New Prescriptions New Prescriptions   POTASSIUM CHLORIDE SA (K-DUR,KLOR-CON) 20 MEQ TABLET    Take 1 tablet (20 mEq total) by mouth 2 (two) times daily.     Okey Regal, PA-C 10/15/16 Mervyn Gay, MD 10/16/16 1540

## 2016-10-15 NOTE — ED Triage Notes (Signed)
Pt complaint of generalized body pain onset 2 days; n/v/d and SOB.

## 2016-10-15 NOTE — Discharge Instructions (Signed)
Please read attached information. If you experience any new or worsening signs or symptoms please return to the emergency room for evaluation. Please follow-up with your primary care provider or specialist as discussed. Please use medication prescribed only as directed and discontinue taking if you have any concerning signs or symptoms.   °

## 2016-10-19 ENCOUNTER — Telehealth: Payer: Self-pay | Admitting: Neurology

## 2016-10-19 ENCOUNTER — Encounter: Payer: Self-pay | Admitting: *Deleted

## 2016-10-19 NOTE — Progress Notes (Signed)
Oncology Nurse Navigator Documentation  Oncology Nurse Navigator Flowsheets 10/19/2016  Navigator Location [facility]  Navigator Encounter Type Other/per Dr. Julien Nordmann, he would like patient to receive treatment this week. I updated scheduling to call and schedule patient to have 7 hour chemo, labs, and follow up with Erasmo Downer (when Dr. Julien Nordmann is in the office) on day 1, then 2 hour chemo day 2, and 2 hour chemo day 3.   Treatment Phase Treatment  Barriers/Navigation Needs Coordination of Care  Education Other  Interventions Coordination of Care  Coordination of Care Other  Acuity Level 2  Time Spent with Patient 75

## 2016-10-19 NOTE — Telephone Encounter (Signed)
Pt is inquiring about paperwork that was to be filled out

## 2016-10-20 ENCOUNTER — Telehealth: Payer: Self-pay | Admitting: Internal Medicine

## 2016-10-20 ENCOUNTER — Encounter (HOSPITAL_COMMUNITY): Payer: Self-pay

## 2016-10-20 ENCOUNTER — Ambulatory Visit (HOSPITAL_BASED_OUTPATIENT_CLINIC_OR_DEPARTMENT_OTHER): Payer: Medicaid Other | Admitting: Medical

## 2016-10-20 ENCOUNTER — Inpatient Hospital Stay (HOSPITAL_COMMUNITY)
Admission: EM | Admit: 2016-10-20 | Discharge: 2016-10-24 | DRG: 393 | Discharge status: Against Medical Advice | Payer: Medicaid Other | Attending: Internal Medicine | Admitting: Internal Medicine

## 2016-10-20 ENCOUNTER — Other Ambulatory Visit (HOSPITAL_BASED_OUTPATIENT_CLINIC_OR_DEPARTMENT_OTHER): Payer: Medicaid Other

## 2016-10-20 VITALS — BP 125/65 | HR 77 | Temp 98.2°F | Resp 20 | Ht 66.0 in | Wt 96.1 lb

## 2016-10-20 DIAGNOSIS — F17210 Nicotine dependence, cigarettes, uncomplicated: Secondary | ICD-10-CM | POA: Diagnosis present

## 2016-10-20 DIAGNOSIS — Z955 Presence of coronary angioplasty implant and graft: Secondary | ICD-10-CM

## 2016-10-20 DIAGNOSIS — I82890 Acute embolism and thrombosis of other specified veins: Secondary | ICD-10-CM | POA: Diagnosis present

## 2016-10-20 DIAGNOSIS — E876 Hypokalemia: Secondary | ICD-10-CM | POA: Diagnosis present

## 2016-10-20 DIAGNOSIS — R112 Nausea with vomiting, unspecified: Secondary | ICD-10-CM | POA: Diagnosis not present

## 2016-10-20 DIAGNOSIS — G894 Chronic pain syndrome: Secondary | ICD-10-CM | POA: Diagnosis present

## 2016-10-20 DIAGNOSIS — J449 Chronic obstructive pulmonary disease, unspecified: Secondary | ICD-10-CM | POA: Diagnosis present

## 2016-10-20 DIAGNOSIS — I700 Atherosclerosis of aorta: Secondary | ICD-10-CM | POA: Diagnosis present

## 2016-10-20 DIAGNOSIS — D649 Anemia, unspecified: Secondary | ICD-10-CM | POA: Diagnosis present

## 2016-10-20 DIAGNOSIS — F1010 Alcohol abuse, uncomplicated: Secondary | ICD-10-CM | POA: Diagnosis present

## 2016-10-20 DIAGNOSIS — R531 Weakness: Secondary | ICD-10-CM | POA: Diagnosis present

## 2016-10-20 DIAGNOSIS — C3490 Malignant neoplasm of unspecified part of unspecified bronchus or lung: Secondary | ICD-10-CM | POA: Diagnosis present

## 2016-10-20 DIAGNOSIS — R197 Diarrhea, unspecified: Secondary | ICD-10-CM

## 2016-10-20 DIAGNOSIS — R52 Pain, unspecified: Secondary | ICD-10-CM

## 2016-10-20 DIAGNOSIS — R109 Unspecified abdominal pain: Secondary | ICD-10-CM

## 2016-10-20 DIAGNOSIS — Z681 Body mass index (BMI) 19 or less, adult: Secondary | ICD-10-CM

## 2016-10-20 DIAGNOSIS — C3412 Malignant neoplasm of upper lobe, left bronchus or lung: Secondary | ICD-10-CM | POA: Diagnosis not present

## 2016-10-20 DIAGNOSIS — I745 Embolism and thrombosis of iliac artery: Secondary | ICD-10-CM | POA: Diagnosis present

## 2016-10-20 DIAGNOSIS — I2510 Atherosclerotic heart disease of native coronary artery without angina pectoris: Secondary | ICD-10-CM | POA: Diagnosis present

## 2016-10-20 DIAGNOSIS — K766 Portal hypertension: Secondary | ICD-10-CM | POA: Diagnosis present

## 2016-10-20 DIAGNOSIS — Z931 Gastrostomy status: Secondary | ICD-10-CM

## 2016-10-20 DIAGNOSIS — Z7189 Other specified counseling: Secondary | ICD-10-CM

## 2016-10-20 DIAGNOSIS — Z5111 Encounter for antineoplastic chemotherapy: Secondary | ICD-10-CM

## 2016-10-20 DIAGNOSIS — G40909 Epilepsy, unspecified, not intractable, without status epilepticus: Secondary | ICD-10-CM | POA: Diagnosis present

## 2016-10-20 DIAGNOSIS — Z79899 Other long term (current) drug therapy: Secondary | ICD-10-CM

## 2016-10-20 DIAGNOSIS — C7889 Secondary malignant neoplasm of other digestive organs: Secondary | ICD-10-CM | POA: Diagnosis present

## 2016-10-20 DIAGNOSIS — Z79891 Long term (current) use of opiate analgesic: Secondary | ICD-10-CM

## 2016-10-20 DIAGNOSIS — F79 Unspecified intellectual disabilities: Secondary | ICD-10-CM | POA: Diagnosis present

## 2016-10-20 DIAGNOSIS — R630 Anorexia: Secondary | ICD-10-CM | POA: Diagnosis not present

## 2016-10-20 DIAGNOSIS — K2210 Ulcer of esophagus without bleeding: Secondary | ICD-10-CM | POA: Diagnosis present

## 2016-10-20 DIAGNOSIS — N400 Enlarged prostate without lower urinary tract symptoms: Secondary | ICD-10-CM | POA: Diagnosis present

## 2016-10-20 DIAGNOSIS — K861 Other chronic pancreatitis: Secondary | ICD-10-CM | POA: Diagnosis present

## 2016-10-20 DIAGNOSIS — K449 Diaphragmatic hernia without obstruction or gangrene: Secondary | ICD-10-CM | POA: Diagnosis present

## 2016-10-20 DIAGNOSIS — K219 Gastro-esophageal reflux disease without esophagitis: Secondary | ICD-10-CM | POA: Diagnosis present

## 2016-10-20 DIAGNOSIS — R739 Hyperglycemia, unspecified: Secondary | ICD-10-CM | POA: Diagnosis present

## 2016-10-20 DIAGNOSIS — I10 Essential (primary) hypertension: Secondary | ICD-10-CM | POA: Diagnosis present

## 2016-10-20 DIAGNOSIS — E43 Unspecified severe protein-calorie malnutrition: Secondary | ICD-10-CM | POA: Diagnosis present

## 2016-10-20 DIAGNOSIS — K648 Other hemorrhoids: Secondary | ICD-10-CM | POA: Diagnosis present

## 2016-10-20 DIAGNOSIS — K559 Vascular disorder of intestine, unspecified: Principal | ICD-10-CM | POA: Diagnosis present

## 2016-10-20 HISTORY — DX: Personal history of irradiation: Z92.3

## 2016-10-20 LAB — CBC WITH DIFFERENTIAL/PLATELET
BASO%: 1.1 % (ref 0.0–2.0)
BASOS ABS: 0.1 10*3/uL (ref 0.0–0.1)
EOS ABS: 0.1 10*3/uL (ref 0.0–0.5)
EOS%: 0.7 % (ref 0.0–7.0)
HEMATOCRIT: 29.7 % — AB (ref 38.4–49.9)
HGB: 10.1 g/dL — ABNORMAL LOW (ref 13.0–17.1)
LYMPH#: 1.1 10*3/uL (ref 0.9–3.3)
LYMPH%: 15.5 % (ref 14.0–49.0)
MCH: 32.8 pg (ref 27.2–33.4)
MCHC: 34 g/dL (ref 32.0–36.0)
MCV: 96.5 fL (ref 79.3–98.0)
MONO#: 0.5 10*3/uL (ref 0.1–0.9)
MONO%: 7.3 % (ref 0.0–14.0)
NEUT#: 5.6 10*3/uL (ref 1.5–6.5)
NEUT%: 75.4 % — AB (ref 39.0–75.0)
PLATELETS: 190 10*3/uL (ref 140–400)
RBC: 3.08 10*6/uL — ABNORMAL LOW (ref 4.20–5.82)
RDW: 15.7 % — ABNORMAL HIGH (ref 11.0–14.6)
WBC: 7.4 10*3/uL (ref 4.0–10.3)

## 2016-10-20 LAB — COMPREHENSIVE METABOLIC PANEL
ALBUMIN: 3.2 g/dL — AB (ref 3.5–5.0)
ALBUMIN: 3.3 g/dL — AB (ref 3.5–5.0)
ALK PHOS: 150 U/L (ref 40–150)
ALT: 11 U/L (ref 0–55)
ALT: 13 U/L — AB (ref 17–63)
ANION GAP: 8 meq/L (ref 3–11)
AST: 16 U/L (ref 5–34)
AST: 18 U/L (ref 15–41)
Alkaline Phosphatase: 139 U/L — ABNORMAL HIGH (ref 38–126)
Anion gap: 8 (ref 5–15)
BUN: 6.5 mg/dL — ABNORMAL LOW (ref 7.0–26.0)
BUN: 8 mg/dL (ref 6–20)
CALCIUM: 8.9 mg/dL (ref 8.4–10.4)
CHLORIDE: 106 meq/L (ref 98–109)
CHLORIDE: 103 mmol/L (ref 101–111)
CO2: 25 mEq/L (ref 22–29)
CO2: 26 mmol/L (ref 22–32)
CREATININE: 0.77 mg/dL (ref 0.61–1.24)
Calcium: 8.3 mg/dL — ABNORMAL LOW (ref 8.9–10.3)
Creatinine: 0.8 mg/dL (ref 0.7–1.3)
GFR calc Af Amer: >60 mL/min (ref >60)
GFR calc non Af Amer: >60 mL/min (ref >60)
Glucose, Bld: 206 mg/dL — ABNORMAL HIGH (ref 65–99)
Glucose: 95 mg/dl (ref 70–140)
POTASSIUM: 3.3 meq/L — AB (ref 3.5–5.1)
POTASSIUM: 3 mmol/L — AB (ref 3.5–5.1)
SODIUM: 137 mmol/L (ref 135–145)
Sodium: 140 mEq/L (ref 136–145)
Total Bilirubin: 0.37 mg/dL (ref 0.20–1.20)
Total Bilirubin: 0.3 mg/dL (ref 0.3–1.2)
Total Protein: 6.8 g/dL (ref 6.4–8.3)
Total Protein: 6.4 g/dL — ABNORMAL LOW (ref 6.5–8.1)

## 2016-10-20 LAB — CBC
HEMATOCRIT: 25.7 % — AB (ref 39.0–52.0)
Hemoglobin: 8.9 g/dL — ABNORMAL LOW (ref 13.0–17.0)
MCH: 33.2 pg (ref 26.0–34.0)
MCHC: 34.6 g/dL (ref 30.0–36.0)
MCV: 95.9 fL (ref 78.0–100.0)
PLATELETS: 156 10*3/uL (ref 150–400)
RBC: 2.68 MIL/uL — ABNORMAL LOW (ref 4.22–5.81)
RDW: 15.2 % (ref 11.5–15.5)
WBC: 6.5 10*3/uL (ref 4.0–10.5)

## 2016-10-20 LAB — TYPE AND SCREEN
ABO/RH(D): O POS
Antibody Screen: NEGATIVE

## 2016-10-20 LAB — I-STAT CG4 LACTIC ACID, ED: Lactic Acid, Venous: 0.78 mmol/L (ref 0.5–1.9)

## 2016-10-20 LAB — MAGNESIUM: Magnesium: 1.7 mg/dl (ref 1.5–2.5)

## 2016-10-20 LAB — PHENYTOIN LEVEL, TOTAL: Phenytoin Lvl: 14.9 ug/mL (ref 10.0–20.0)

## 2016-10-20 LAB — LIPASE, BLOOD: LIPASE: 28 U/L (ref 11–51)

## 2016-10-20 MED ORDER — MIRTAZAPINE 15 MG PO TABS
15.0000 mg | ORAL_TABLET | Freq: Every evening | ORAL | Status: DC
  Administered 2016-10-20 – 2016-10-23 (×3): 15 mg via ORAL
  Filled 2016-10-20 (×3): qty 1

## 2016-10-20 MED ORDER — DULOXETINE HCL 30 MG PO CPEP
30.0000 mg | ORAL_CAPSULE | Freq: Two times a day (BID) | ORAL | Status: DC
  Administered 2016-10-20 – 2016-10-23 (×5): 30 mg via ORAL
  Filled 2016-10-20 (×5): qty 1

## 2016-10-20 MED ORDER — POTASSIUM CHLORIDE CRYS ER 20 MEQ PO TBCR
20.0000 meq | EXTENDED_RELEASE_TABLET | Freq: Two times a day (BID) | ORAL | Status: DC
  Administered 2016-10-20 – 2016-10-23 (×5): 20 meq via ORAL
  Filled 2016-10-20 (×5): qty 1

## 2016-10-20 MED ORDER — CHLORHEXIDINE GLUCONATE 0.12 % MT SOLN
15.0000 mL | Freq: Two times a day (BID) | OROMUCOSAL | Status: DC
  Administered 2016-10-21 – 2016-10-23 (×4): 15 mL via OROMUCOSAL
  Filled 2016-10-20 (×4): qty 15

## 2016-10-20 MED ORDER — AMLODIPINE BESYLATE 5 MG PO TABS
5.0000 mg | ORAL_TABLET | Freq: Every day | ORAL | Status: DC
  Administered 2016-10-21 – 2016-10-22 (×2): 5 mg via ORAL
  Filled 2016-10-20 (×2): qty 1

## 2016-10-20 MED ORDER — FERROUS SULFATE 325 (65 FE) MG PO TABS
325.0000 mg | ORAL_TABLET | Freq: Every day | ORAL | Status: DC
Start: 2016-10-21 — End: 2016-10-24
  Administered 2016-10-21 – 2016-10-22 (×2): 325 mg via ORAL
  Filled 2016-10-20 (×2): qty 1

## 2016-10-20 MED ORDER — SODIUM CHLORIDE 0.9 % IV SOLN
INTRAVENOUS | Status: DC
  Administered 2016-10-20 – 2016-10-21 (×2): via INTRAVENOUS

## 2016-10-20 MED ORDER — ACETAMINOPHEN 325 MG PO TABS
650.0000 mg | ORAL_TABLET | Freq: Four times a day (QID) | ORAL | Status: DC | PRN
  Administered 2016-10-21 (×2): 650 mg via ORAL
  Filled 2016-10-20 (×3): qty 2

## 2016-10-20 MED ORDER — ENOXAPARIN SODIUM 30 MG/0.3ML ~~LOC~~ SOLN: 30.0000 mg | SUBCUTANEOUS | Status: DC

## 2016-10-20 MED ORDER — HYDROMORPHONE HCL 1 MG/ML IJ SOLN
1.0000 mg | INTRAMUSCULAR | Status: DC | PRN
  Administered 2016-10-20: 1 mg via INTRAVENOUS
  Filled 2016-10-20: qty 1

## 2016-10-20 MED ORDER — SODIUM CHLORIDE 0.9 % IV BOLUS (SEPSIS)
1000.0000 mL | Freq: Once | INTRAVENOUS | Status: AC
  Administered 2016-10-20: 1000 mL via INTRAVENOUS

## 2016-10-20 MED ORDER — PHENYTOIN SODIUM EXTENDED 100 MG PO CAPS
300.0000 mg | ORAL_CAPSULE | Freq: Every day | ORAL | Status: DC
  Administered 2016-10-20 – 2016-10-23 (×3): 300 mg via ORAL
  Filled 2016-10-20 (×3): qty 3

## 2016-10-20 MED ORDER — PROCHLORPERAZINE EDISYLATE 5 MG/ML IJ SOLN
5.0000 mg | Freq: Four times a day (QID) | INTRAMUSCULAR | Status: DC | PRN
  Administered 2016-10-20 – 2016-10-22 (×4): 5 mg via INTRAVENOUS
  Filled 2016-10-20 (×4): qty 2

## 2016-10-20 MED ORDER — MORPHINE SULFATE (PF) 4 MG/ML IV SOLN
4.0000 mg | Freq: Once | INTRAVENOUS | Status: AC
  Administered 2016-10-20: 4 mg via INTRAVENOUS
  Filled 2016-10-20: qty 1

## 2016-10-20 MED ORDER — SUCRALFATE 1 GM/10ML PO SUSP
1.0000 g | Freq: Three times a day (TID) | ORAL | Status: DC
  Administered 2016-10-21 – 2016-10-23 (×9): 1 g via ORAL
  Filled 2016-10-20 (×9): qty 10

## 2016-10-20 MED ORDER — ORAL CARE MOUTH RINSE
15.0000 mL | Freq: Two times a day (BID) | OROMUCOSAL | Status: DC
  Administered 2016-10-22 (×2): 15 mL via OROMUCOSAL

## 2016-10-20 MED ORDER — PROMETHAZINE HCL 25 MG PO TABS
25.0000 mg | ORAL_TABLET | Freq: Four times a day (QID) | ORAL | Status: DC | PRN
  Administered 2016-10-20: 25 mg via ORAL
  Filled 2016-10-20: qty 1

## 2016-10-20 MED ORDER — ALBUTEROL SULFATE (2.5 MG/3ML) 0.083% IN NEBU: 2.5000 mg | INHALATION_SOLUTION | Freq: Four times a day (QID) | RESPIRATORY_TRACT | Status: DC | PRN

## 2016-10-20 MED ORDER — PROCHLORPERAZINE MALEATE 10 MG PO TABS
10.0000 mg | ORAL_TABLET | Freq: Four times a day (QID) | ORAL | Status: DC | PRN
  Filled 2016-10-20: qty 1

## 2016-10-20 MED ORDER — PANTOPRAZOLE SODIUM 40 MG IV SOLR
40.0000 mg | Freq: Two times a day (BID) | INTRAVENOUS | Status: DC
  Administered 2016-10-20 – 2016-10-21 (×2): 40 mg via INTRAVENOUS
  Filled 2016-10-20 (×2): qty 40

## 2016-10-20 MED ORDER — POTASSIUM CHLORIDE 10 MEQ/100ML IV SOLN
10.0000 meq | Freq: Once | INTRAVENOUS | Status: AC
  Administered 2016-10-20: 10 meq via INTRAVENOUS
  Filled 2016-10-20: qty 100

## 2016-10-20 MED ORDER — GABAPENTIN 300 MG PO CAPS
300.0000 mg | ORAL_CAPSULE | Freq: Four times a day (QID) | ORAL | Status: DC
  Administered 2016-10-20 – 2016-10-23 (×10): 300 mg via ORAL
  Filled 2016-10-20 (×11): qty 1

## 2016-10-20 MED ORDER — ONDANSETRON HCL 4 MG/2ML IJ SOLN
4.0000 mg | Freq: Four times a day (QID) | INTRAMUSCULAR | Status: DC | PRN
  Administered 2016-10-20: 4 mg via INTRAVENOUS
  Filled 2016-10-20: qty 2

## 2016-10-20 MED ORDER — POTASSIUM CHLORIDE CRYS ER 20 MEQ PO TBCR
40.0000 meq | EXTENDED_RELEASE_TABLET | Freq: Once | ORAL | Status: AC
  Administered 2016-10-20: 20 meq via ORAL
  Filled 2016-10-20: qty 2

## 2016-10-20 MED ORDER — IOPAMIDOL (ISOVUE-300) INJECTION 61%
INTRAVENOUS | Status: AC
  Filled 2016-10-20: qty 30

## 2016-10-20 MED ORDER — ACETAMINOPHEN 650 MG RE SUPP: 650.0000 mg | Freq: Four times a day (QID) | RECTAL | Status: DC | PRN

## 2016-10-20 MED ORDER — SODIUM CHLORIDE 0.9 % IV SOLN
INTRAVENOUS | Status: DC
Start: 2016-10-20 — End: 2016-10-21
  Administered 2016-10-20: 21:00:00 via INTRAVENOUS

## 2016-10-20 MED ORDER — SODIUM CHLORIDE 0.9 % IV SOLN: INTRAVENOUS | Status: DC

## 2016-10-20 NOTE — Progress Notes (Signed)
Is [facility] for ongoing profound fatigue, ongoing diarrhea, nausea and vomiting. Pt states he has diarrhea 5-6 x a day, along with nausea and vomiting. Cannot keep any fluids or food down. He is afraid to use PEG tube because of nausea and vomiting.  Pt seen by Sandi Mealy, PA  Pt to be go to ED. Report called to charge nurse per Sandi Mealy, PA Transported via w/c to ED per Abelina Bachelor, RN

## 2016-10-20 NOTE — H&P (Signed)
TRH H&P   Patient Demographics:    Inocente Krach, is a 56 y.o. male  MRN: [id number]   DOB - [birth year]  Admit Date - 10/20/2016  Outpatient Primary MD for the patient is [name], FNP  Referring MD/NP/PA: Vivi Martens  Outpatient Specialists:  Curt Bears (oncology)  Patient coming from: home  Chief Complaint  Patient presents with  . Emesis  . Nausea  . Diarrhea      HPI:    Kwane Rohl  is a 56 y.o. male, w small cell lung cancer dx 07/2016, prior colonoscopy 06/26/2016 => int hemorrhoids ,  apparently c/o diarrhea for the past 1 week. And epigastric discomfort.  + n/v. + hearrtburn.   No bloody emesis.  Denies fever, chills, constipation, brbpr. Pt presented due to abdominal discomfort  In ED, wbc 7.4, Hgb 10.1, Plt 190,  Na 140, K 3.3 , Bun 6.5, Creatinine 0.8.  Pt will be admitted for hypokalemia, diarrhea, n/v.     Review of systems:    In addition to the HPI above,  No Fever-chills, No Headache, No changes with Vision or hearing, No problems swallowing food or Liquids, No Chest pain, Cough or Shortness of Breath, No Blood in stool or Urine, No dysuria, No new skin rashes or bruises, No new joints pains-aches,  No new weakness, tingling, numbness in any extremity, No recent weight gain or loss, No polyuria, polydypsia or polyphagia, No significant Mental Stressors.  A full 10 point Review of Systems was done, except as stated above, all other Review of Systems were negative.   With Past History of the following :    Past Medical History:  Diagnosis Date  . Anxiety states 08/04/2010  . Chronic airway obstruction, not elsewhere classified 08/04/2010  . Chronic pain syndrome 08/04/2010  . Chronic pancreatitis ([place]) 08/04/2010  . Coronary artery disease   . GERD (gastroesophageal reflux disease)   . Hiatal hernia    EGD 01/18/13  . Insomnia  08/04/2010  . Intellectual disability   . Lung nodule 05/20/2016  . Other abnormal glucose 08/04/2010  . Pancreatitis chronic 08/04/2010  . Protein calorie malnutrition ([place]) 2010  . Seizure ([place]) 08/04/2010  . Severe anemia 05/20/2016  . 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 Splenic vein thrombosis    Chronic.  . Tobacco use disorder 08/04/2010  . Unspecified essential hypertension 08/04/2010      Past Surgical History:  Procedure Laterality Date  . COLONOSCOPY WITH PROPOFOL N/A 06/26/2016   Procedure: COLONOSCOPY WITH PROPOFOL;  Surgeon: Rogene Houston, MD;  [facility];  Service: Endoscopy;  Laterality: N/A;  10:30  . CORONARY ANGIOPLASTY WITH STENT PLACEMENT    . ESOPHAGOGASTRODUODENOSCOPY N/A 01/18/2013   Procedure: ESOPHAGOGASTRODUODENOSCOPY (EGD);  Surgeon: Rogene Houston, MD;  [facility];  Service: Endoscopy;  Laterality: N/A;  .  EUS  03/26/2009   [facility]  . GASTROSTOMY-JEJEUNOSTOMY TUBE CHANGE/PLACEMENT  12/11/2008   [facility]  . I&D EXTREMITY Left 09/12/2012   Procedure: IRRIGATION AND DEBRIDEMENT LEFT FIFTH FINGER;  Surgeon: Roseanne Kaufman, MD;  Location: [place];  Service: Orthopedics;  Laterality: Left;  . IR FLUORO GUIDE PORT INSERTION RIGHT  08/31/2016  . IR US GUIDE VASC ACCESS RIGHT  08/31/2016  . JEJUNOSTOMY FEEDING TUBE  07/13/2010  . NERVE AND TENDON REPAIR Left 09/12/2012   Procedure: NERVE AND TENDON REPAIR LEFT FIFTH FINGER;  Surgeon: Roseanne Kaufman, MD;  Location: [place];  Service: Orthopedics;  Laterality: Left;  . OPEN REDUCTION INTERNAL FIXATION (ORIF) METACARPAL Left 09/12/2012   Procedure: OPEN REDUCTION INTERNAL FIXATION LEFT FIFTH FINGER;  Surgeon: Roseanne Kaufman, MD;  Location: [place];  Service: Orthopedics;  Laterality: Left;  . PANCREATIC PSEUDOCYST DRAINAGE  07/13/2010  . PORTACATH PLACEMENT  11/28/2008   FLEISHMAN  . VIDEO BRONCHOSCOPY WITH ENDOBRONCHIAL ULTRASOUND N/A 07/24/2016   Procedure: VIDEO  BRONCHOSCOPY WITH ENDOBRONCHIAL ULTRASOUND;  Surgeon: Grace Isaac, MD;  [facility];  Service: Thoracic;  Laterality: N/A;      Social History:     Social History  Substance Use Topics  . Smoking status: Current Every Day Smoker    Packs/day: 0.25    Years: 20.00    Types: Cigarettes  . Smokeless tobacco: Never Used     Comment: 1 pack a day since age 81  . Alcohol use No     Comment: No etoh since 5-6 yrs     Lives - at home  Mobility - walks by self   Family History :     Family History  Problem Relation Age of Onset  . Alcoholism Father   . Alcoholism Mother       Home Medications:   Prior to Admission medications   Medication Sig Start Date End Date Taking? Authorizing Provider  albuterol (PROVENTIL HFA;VENTOLIN HFA) 108 (90 Base) MCG/ACT inhaler Inhale 2 puffs into the lungs every 6 (six) hours as needed for wheezing or shortness of breath. 09/01/16  Yes Curcio, Roselie Awkward, NP  amLODipine (NORVASC) 10 MG tablet Take 0.5 tablets (5 mg total) by mouth daily. 10/04/16  Yes [name], FNP  CREON [phone] units CPEP TAKE TWO CAPSULES BY MOUTH BEFORE MEALS 08/20/16  Yes [name], FNP  DULoxetine (CYMBALTA) 30 MG capsule Take 1 capsule (30 mg total) by mouth 2 (two) times daily. 07/28/16  Yes [name], FNP  ferrous sulfate 325 (65 FE) MG tablet TAKE ONE (1) TABLET BY MOUTH EVERY DAY WITH BREAKFAST Patient taking differently: Take 325 mg by mouth daily with breakfast.  06/19/16  Yes [name], FNP  gabapentin (NEURONTIN) 300 MG capsule TAKE ONE (1) CAPSULE BY MOUTH 4 TIMES DAILY AS NEEDED Patient taking differently: TAKE 300 mg four times daily BY MOUTH as needed for NERVE PAIN 08/20/16  Yes [name], FNP  hydrocortisone cream 0.5 % Apply 1 application topically 2 (two) times daily as needed for itching (dry skin).   Yes [provider]  mirtazapine (REMERON) 15 MG tablet TAKE ONE TABLET BY MOUTH EVERY EVENING Patient  taking differently: TAKE 15 MG TABLET BY MOUTH EVERY EVENING 09/17/16  Yes [name], FNP  ondansetron (ZOFRAN) 4 MG tablet Take 1 tablet (4 mg total) by mouth every 6 (six) hours as needed for nausea. 05/15/16  Yes Eugenie Filler, MD  pantoprazole (PROTONIX) 40 MG  tablet TAKE ONE (1) TABLET BY MOUTH EVERY DAY Patient taking differently: take 40 mg tablet, by mouth, once daily 08/20/16  Yes [name], FNP  phenytoin (DILANTIN) 300 MG ER capsule Take 1 capsule (300 mg total) by mouth at bedtime. 10/09/16  Yes Cameron Sprang, MD  potassium chloride SA (K-DUR,KLOR-CON) 20 MEQ tablet Take 1 tablet (20 mEq total) by mouth 2 (two) times daily. 10/15/16  Yes Hedges, Dellis Filbert, PA-C  prochlorperazine (COMPAZINE) 10 MG tablet Take 1 tablet (10 mg total) by mouth every 6 (six) hours as needed for nausea or vomiting. 09/23/16  Yes Curt Bears, MD  promethazine (PHENERGAN) 25 MG tablet Take 25 mg by mouth every 6 (six) hours as needed for nausea or vomiting.   Yes [provider]  ranitidine (ZANTAC) 150 MG capsule TAKE 1 CAPSULE BY MOUTH TWICE DAILY Patient taking differently: take 150 mg capsule, by mouth, twice daily 06/13/15  Yes Micheline Chapman, NP  sucralfate (CARAFATE) 1 GM/10ML suspension Take 10 mLs (1 g total) by mouth 4 (four) times daily -  with meals and at bedtime. 08/25/16  Yes Gery Pray, MD  vitamin C (ASCORBIC ACID) 500 MG tablet Take 1 tablet (500 mg total) by mouth daily. 11/20/15  Yes Micheline Chapman, NP  Adhesive Tape (CLOTH ADHESIVE SURG 1/2"X10YD) TAPE 1 each by Does not apply route 2 (two) times daily as needed. 03/09/16   [name], FNP  Gauze Pads & Dressings (BIOGUARD GAUZE SPONGES) 4"X4" PADS 1 each by Does not apply route 2 (two) times daily. 03/09/16   [name], FNP  lidocaine-prilocaine (EMLA) cream Apply 1 application topically as needed. Patient taking differently: Apply 1 application topically as needed (access port).  09/01/16    Maryanna Shape, NP  nicotine (NICODERM CQ) 14 mg/24hr patch Place 1 patch (14 mg total) onto the skin daily. Patient not taking: Reported on 10/20/2016 08/27/16   [name], FNP  Nutritional Supplements (FEEDING SUPPLEMENT, VITAL AF 1.2 CAL,) LIQD Place 1,000 mLs into feeding tube daily. At 78ml/hr Patient not taking: Reported on 10/20/2016 05/16/16   Eugenie Filler, MD  Oxycodone HCl 10 MG TABS Take 1 tablet (10 mg total) by mouth every 6 (six) hours as needed. Patient not taking: Reported on 10/20/2016 09/29/16   Tresa Garter, MD  Water For Irrigation, Sterile (FREE WATER) SOLN Place 200 mLs into feeding tube every 8 (eight) hours. 05/15/16   Eugenie Filler, MD  Wound Dressings (SONAFINE EX) Apply topically.    [provider]     Allergies:     Allergies  Allergen Reactions  . Latex Rash     Physical Exam:   Vitals  Blood pressure 139/72, pulse (!) 56, temperature (!) 97.3 F (36.3 C), temperature source Oral, resp. rate 17, SpO2 100 %.   1. General  lying in bed in NAD,   2. Normal affect and insight, Not Suicidal or Homicidal, Awake Alert, Oriented X 3.  3. No F.N deficits, ALL C.Nerves Intact, Strength 5/5 all 4 extremities, Sensation intact all 4 extremities, Plantars down going.  4. Ears and Eyes appear Normal, Conjunctivae clear, PERRLA. Moist Oral Mucosa.  5. Supple Neck, No JVD, No cervical lymphadenopathy appriciated, No Carotid Bruits.  6. Symmetrical Chest wall movement, Good air movement bilaterally, CTAB.  7. RRR, No Gallops, Rubs or Murmurs, No Parasternal Heave.  8. Positive Bowel Sounds, Abdomen Soft, No tenderness, No organomegaly appriciated,No rebound -guarding or rigidity.  9.  No Cyanosis, Normal Skin Turgor, No Skin Rash or Bruise.  10. Good muscle tone,  joints appear normal , no effusions, Normal ROM.  11. No Palpable Lymph Nodes in Neck or Axillae    Data Review:    CBC  Recent Labs Lab 10/15/16 1343  10/20/16 1124 10/20/16 1740  WBC 6.8 7.4 6.5  HGB 9.7* 10.1* 8.9*  HCT 27.8* 29.7* 25.7*  PLT 210 190 156  MCV 94.9 96.5 95.9  MCH 33.1 32.8 33.2  MCHC 34.9 34.0 34.6  RDW 15.8* 15.7* 15.2  LYMPHSABS  --  1.1  --   MONOABS  --  0.5  --   EOSABS  --  0.1  --   BASOSABS  --  0.1  --    ------------------------------------------------------------------------------------------------------------------  Chemistries   Recent Labs Lab 10/15/16 1343 10/20/16 1124 10/20/16 1740  NA 141 140 137  K 2.9* 3.3* 3.0*  CL 104  --  103  CO2 27 25 26   GLUCOSE 118* 95 206*  BUN 6 6.5* 8  CREATININE 0.73 0.8 0.77  CALCIUM 8.5* 8.9 8.3*  MG  --  1.7  --   AST 22 16 18   ALT 13* 11 13*  ALKPHOS 124 150 139*  BILITOT 0.6 0.37 0.3   ------------------------------------------------------------------------------------------------------------------ estimated creatinine clearance is 63.6 mL/min (by C-G formula based on SCr of 0.77 mg/dL). ------------------------------------------------------------------------------------------------------------------ No results for input(s): TSH, T4TOTAL, T3FREE, THYROIDAB in the last 72 hours.  Invalid input(s): FREET3  Coagulation profile No results for input(s): INR, PROTIME in the last 168 hours. ------------------------------------------------------------------------------------------------------------------- No results for input(s): DDIMER in the last 72 hours. -------------------------------------------------------------------------------------------------------------------  Cardiac Enzymes No results for input(s): CKMB, TROPONINI, MYOGLOBIN in the last 168 hours.  Invalid input(s): CK ------------------------------------------------------------------------------------------------------------------ No results found for:  BNP   ---------------------------------------------------------------------------------------------------------------  Urinalysis    Component Value Date/Time   COLORURINE AMBER (A) 05/09/2016 [place] 05/09/2016 1258   LABSPEC 1.020 06/02/2016 1126   PHURINE 6.5 06/02/2016 [place] 06/02/2016 1126   [place] 06/02/2016 [place] 06/02/2016 1126   KETONESUR TRACE (A) 06/02/2016 1126   PROTEINUR 30 (A) 06/02/2016 1126   UROBILINOGEN 2.0 (H) 06/02/2016 1126   NITRITE NEGATIVE 06/02/2016 1126   LEUKOCYTESUR NEGATIVE 06/02/2016 1126    ----------------------------------------------------------------------------------------------------------------   Imaging Results:    No results found.    Assessment & Plan:    Principal Problem:   Diarrhea Active Problems:   Anemia   Hypokalemia    N/v  ? Gastritis Check CT abd pelvis protonix 40mg  iv bid  Diarrhea Check stool studies, gi pathogen panel, c. Diff   Hypokalemia Replete, check cmp in am  Anemia Check cbc in am      DVT Prophylaxis -  Lovenox - SCDs   AM Labs Ordered, also please review Full Orders  Family Communication: Admission, patients condition and plan of care including tests being ordered have been discussed with the patient who indicate understanding and agree with the plan and Code Status.  Code Status FULL CODE  Likely DC to home  Condition GUARDED    Consults called: none  Admission status: observation  Time spent in minutes : 45   Jani Gravel M.D on 10/20/2016 at 8:03 PM  Between 7am to 7pm - Pager - [phone]  After 7pm go to [email or web address] - password [facility]  [facility] - Office  [phone]

## 2016-10-20 NOTE — ED Triage Notes (Signed)
Pt reports 8/10 abd pain, n/v, and diarrhea. Pt afebrile in triage. Pts last chemo was a week and half ago. Pt A+OX4, speaking in complete sentences, in [facility] at triage.

## 2016-10-20 NOTE — ED Notes (Signed)
Bed: WA09 Expected date:  Expected time:  Means of arrival:  Comments: TR4 Chemo Pt

## 2016-10-20 NOTE — ED Notes (Signed)
Patient presents with right-sided chest port already accessed by [facility] cancer center. Blood return noted. Flushed without resistance.

## 2016-10-20 NOTE — ED Notes (Signed)
ED TO INPATIENT HANDOFF REPORT  Name/Age/Gender Marcus Brooks 56 y.o. male  Code Status Code Status History    Date Active Date Inactive Code Status Order ID Comments User Context   05/20/2016  7:06 PM 05/21/2016  2:43 PM Full Code [id number]  Vianne Bulls, MD ED   05/09/2016  4:08 PM 05/15/2016  4:10 PM Full Code [id number]  Radene Gunning, NP ED   06/10/2015  9:07 PM 06/11/2015  8:22 PM Full Code [id number]  Quintella Baton, MD Inpatient   01/23/2013 11:51 PM 01/27/2013  4:48 PM Full Code [id number]  Oswald Hillock, MD Inpatient   01/17/2013  6:32 AM 01/21/2013  3:22 PM Full Code [id number]  Bonnielee Haff, MD Inpatient   01/09/2013  5:04 AM 01/11/2013  2:46 PM Full Code [id number]  Jonetta Osgood, MD Inpatient   10/04/2012 12:22 AM 10/04/2012  6:34 PM Full Code [id number]  Theodis Blaze, MD ED    Advance Directive Documentation     Most Recent Value  Type of Advance Directive  Healthcare Power of Attorney, Living will  Pre-existing out of facility DNR order (yellow form or pink MOST form)  -  "MOST" Form in Place?  -      Home/SNF/Other Home  Chief Complaint vomiting/diarrhea  Level of Care/Admitting Diagnosis ED Disposition    ED Disposition Condition [place]: [facility] [100102]  Level of Care: Telemetry [5]  Admit to tele based on following criteria: Other see comments  Comments: bradycardia  Diagnosis: Hypokalemia [568127]  Admitting Physician: Jani Gravel [[id number]]  Attending Physician: Jani Gravel [[id number]]  PT Class (Do Not Modify): Observation [104]  PT Acc Code (Do Not Modify): Observation [10022]       Medical History Past Medical History:  Diagnosis Date  . Anxiety states 08/04/2010  . Chronic airway obstruction, not elsewhere classified 08/04/2010  . Chronic pain syndrome 08/04/2010  . Chronic pancreatitis ([place]) 08/04/2010  . Coronary artery disease   . GERD (gastroesophageal reflux disease)   . Hiatal hernia    EGD 01/18/13   . Insomnia 08/04/2010  . Intellectual disability   . Lung nodule 05/20/2016  . Other abnormal glucose 08/04/2010  . Pancreatitis chronic 08/04/2010  . Protein calorie malnutrition ([place]) 2010  . Seizure ([place]) 08/04/2010  . Severe anemia 05/20/2016  . 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 Splenic vein thrombosis    Chronic.  . Tobacco use disorder 08/04/2010  . Unspecified essential hypertension 08/04/2010    Allergies Allergies  Allergen Reactions  . Latex Rash    IV Location/Drains/Wounds Patient Lines/Drains/Airways Status   Active Line/Drains/Airways    Name:   Placement date:   Placement time:   Site:   Days:   Implanted Port 01/23/13 Left Chest  01/23/13    1802    Chest    1366   Implanted Port 08/31/16 Right Chest  08/31/16    1041    Chest    50   Gastrostomy/Enterostomy Gastrostomy RLQ  07/15/10    1400    RLQ    2289   Gastrostomy/Enterostomy Other (comment)          Other (comment)       Gastrostomy/Enterostomy Percutaneous endoscopic gastrostomy (PEG) LLQ          LLQ       Incision 10/04/12 Finger (Comment which one) Left  10/04/12    0300  1477   Incision (Closed) 07/24/16 N/A Other (Comment)  07/24/16    1252      88   Wound 09/12/12 Laceration Finger (Comment which one) Left Full thickness laceration at distal inter phalange with obvious deformity   09/12/12    1857    Finger (Comment which one)    1499          Labs/Imaging Results for orders placed or performed during the hospital encounter of 10/20/16 (from the past 48 hour(s))  Lipase, blood     Status: None   Collection Time: 10/20/16  5:40 PM  Result Value Ref Range   Lipase 28 11 - 51 U/L  Comprehensive metabolic panel     Status: Abnormal   Collection Time: 10/20/16  5:40 PM  Result Value Ref Range   Sodium 137 135 - 145 mmol/L   Potassium 3.0 (L) 3.5 - 5.1 mmol/L   Chloride 103 101 - 111 mmol/L   CO2 26 22 - 32 mmol/L   Glucose, Bld 206 (H) 65 - 99 mg/dL   BUN  8 6 - 20 mg/dL   Creatinine, Ser 0.77 0.61 - 1.24 mg/dL   Calcium 8.3 (L) 8.9 - 10.3 mg/dL   Total Protein 6.4 (L) 6.5 - 8.1 g/dL   Albumin 3.3 (L) 3.5 - 5.0 g/dL   AST 18 15 - 41 U/L   ALT 13 (L) 17 - 63 U/L   Alkaline Phosphatase 139 (H) 38 - 126 U/L   Total Bilirubin 0.3 0.3 - 1.2 mg/dL   GFR calc non Af Amer >60 >60 mL/min   GFR calc Af Amer >60 >60 mL/min    Comment: (NOTE) The eGFR has been calculated using the CKD EPI equation. This calculation has not been validated in all clinical situations. eGFR's persistently <60 mL/min signify possible Chronic Kidney Disease.    Anion gap 8 5 - 15  CBC     Status: Abnormal   Collection Time: 10/20/16  5:40 PM  Result Value Ref Range   WBC 6.5 4.0 - 10.5 K/uL   RBC 2.68 (L) 4.22 - 5.81 MIL/uL   Hemoglobin 8.9 (L) 13.0 - 17.0 g/dL   HCT 25.7 (L) 39.0 - 52.0 %   MCV 95.9 78.0 - 100.0 fL   MCH 33.2 26.0 - 34.0 pg   MCHC 34.6 30.0 - 36.0 g/dL   RDW 15.2 11.5 - 15.5 %   Platelets 156 150 - 400 K/uL  Phenytoin level, total     Status: None   Collection Time: 10/20/16  5:40 PM  Result Value Ref Range   Phenytoin Lvl 14.9 10.0 - 20.0 ug/mL  Type and screen     Status: None   Collection Time: 10/20/16  5:40 PM  Result Value Ref Range   ABO/RH(D) O POS    Antibody Screen NEG    Sample Expiration 10/23/2016   I-Stat CG4 Lactic Acid, ED     Status: None   Collection Time: 10/20/16  5:53 PM  Result Value Ref Range   Lactic Acid, Venous 0.78 0.5 - 1.9 mmol/L   No results found.  Pending Labs [facility]     Ordered   10/20/16 1659  Urinalysis, Routine w reflex microscopic  STAT,   STAT     10/20/16 1658   Signed and Held  Gastrointestinal Panel by PCR , Stool  (Gastrointestinal Panel by PCR, Stool)  Once,   R     Signed and Held  Signed and Held  C difficile quick scan w PCR reflex  (C Difficile quick screen w PCR reflex panel)  Once, for 48 hours,   R    Comments:  Laxatives (last 72 hours)   None     Question  Answer Comment  Is your patient experiencing loose or watery stools (3 or more in 24 hours)? Yes   Has the patient received laxatives in the last 24 hours? No   Has a negative Cdiff test resulted in the last 7 days? No      Signed and Held   Signed and Held  Creatinine, serum  (enoxaparin (LOVENOX)    CrCl >/= 30 ml/min)  Weekly,   R    Comments:  while on enoxaparin therapy    Signed and Held   Signed and Held  Comprehensive metabolic panel  Tomorrow morning,   R     Signed and Held   Signed and Held  CBC  Tomorrow morning,   R     Signed and Held   Signed and Held  Hemoglobin A1c  Tomorrow morning,   R     Signed and Held      Vitals/Pain Today's Vitals   10/20/16 1930 10/20/16 1939 10/20/16 2000 10/20/16 2030  BP: 139/72 139/72 140/80 136/74  Pulse: (!) 57 (!) 56 85 (!) 108  Resp:  17    Temp:  (!) 97.3 F (36.3 C)    TempSrc:  Oral    SpO2:  100%    PainSc:        Isolation Precautions No active isolations  Medications Medications  0.9 %  sodium chloride infusion ( Intravenous New Bag/Given 10/20/16 2041)  potassium chloride 10 mEq in 100 mL IVPB (10 mEq Intravenous New Bag/Given 10/20/16 2040)  HYDROmorphone (DILAUDID) injection 1 mg (1 mg Intravenous Given 10/20/16 2040)  ondansetron (ZOFRAN) injection 4 mg (4 mg Intravenous Given 10/20/16 2040)  sodium chloride 0.9 % bolus 1,000 mL (0 mLs Intravenous Stopped 10/20/16 1934)  sodium chloride 0.9 % bolus 1,000 mL (0 mLs Intravenous Stopped 10/20/16 1934)  morphine 4 MG/ML injection 4 mg (4 mg Intravenous Given 10/20/16 1804)  potassium chloride SA (K-DUR,KLOR-CON) CR tablet 40 mEq (20 mEq Oral Given 10/20/16 1919)    Mobility walks

## 2016-10-20 NOTE — ED Notes (Signed)
From cancer center-states N/V/D-history of lung cancer-states unable to keep POs down

## 2016-10-20 NOTE — ED Provider Notes (Signed)
[facility] DEPT Provider Note   CSN: [id number] Arrival date & time: 10/20/16  1617     History   Chief Complaint Chief Complaint  Patient presents with  . Emesis  . Nausea  . Diarrhea    HPI DAVID RODRIQUEZ is a 56 y.o. male.  56 year old male with history of chronic pancreatitis and small cell carcinoma of the lung presents with one-week history of decreased oral intake as well as emesis. He is currently undergoing chemotherapy and last was a week ago. States that he has not been able to keep anything down despite taking anti-medics. Has had watery diarrhea without blood. Has had worsening of his chronic abdominal discomfort without fever or chills. Has been unable to keep down his home opiates medications. Feels like he is dehydrated and went to the [facility] today for dizziness and weakness that was worse with standing. No cardiac symptoms. Was sent here for further evaluation and management      Past Medical History:  Diagnosis Date  . Anxiety states 08/04/2010  . Chronic airway obstruction, not elsewhere classified 08/04/2010  . Chronic pain syndrome 08/04/2010  . Chronic pancreatitis ([place]) 08/04/2010  . Coronary artery disease   . GERD (gastroesophageal reflux disease)   . Hiatal hernia    EGD 01/18/13  . Insomnia 08/04/2010  . Intellectual disability   . Lung nodule 05/20/2016  . Other abnormal glucose 08/04/2010  . Pancreatitis chronic 08/04/2010  . Protein calorie malnutrition ([place]) 2010  . Seizure ([place]) 08/04/2010  . Severe anemia 05/20/2016  . 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 Splenic vein thrombosis    Chronic.  . Tobacco use disorder 08/04/2010  . Unspecified essential hypertension 08/04/2010    Patient Active Problem List   Diagnosis Date Noted  . Localization-related (focal) (partial) symptomatic epilepsy and epileptic syndromes with complex partial seizures, not intractable, without status epilepticus ([place])  10/09/2016  . History of stroke 10/09/2016  . Dehydration 09/24/2016  . 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 Encounter for antineoplastic chemotherapy 07/30/2016  . Goals of care, counseling/discussion 07/30/2016  . Lung nodule 05/20/2016  . Symptomatic anemia 05/20/2016  . Pancreatic lesion   . Malnutrition ([place])   . Phlegmon of pancreas 05/09/2016  . Acute on chronic pancreatitis ([place]) 05/09/2016  . Lymphadenopathy 05/09/2016  . Hx of colonic polyps 04/23/2016  . Atherosclerosis of aorta ([place]) 04/09/2016  . History of colonic polyps 02/19/2016  . Dizziness 06/10/2015  . Pancreatic pseudocyst 03/13/2013  . Cellulitis 01/24/2013  . Gastritis 01/24/2013  . Pancreatitis 01/23/2013  . Irritation around percutaneous endoscopic gastrostomy (PEG) tube site ([place]) 01/23/2013  . Hyponatremia 01/19/2013  . Hiatal hernia 01/19/2013  . Acute blood loss anemia 01/18/2013  . 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 Coronary artery disease   . Upper GI bleed 01/17/2013  . Chronic pancreatitis ([place]) 01/17/2013  . Protein-calorie malnutrition, severe ([place]) 01/10/2013  . Hematemesis 01/09/2013  . Pancreatitis, acute 01/09/2013  . Seizure disorder ([place]) 01/09/2013  . Syncope 01/09/2013  . Acute pancreatitis 10/03/2012  . Leukocytosis 10/03/2012  . Transaminitis 10/03/2012  . Chest pain 10/03/2012  . Pain around PEG tube site 03/09/2011  . Hypertension 11/05/2010  . GERD (gastroesophageal reflux disease) 11/05/2010  . Chronic pain syndrome 08/04/2010  . Tobacco use disorder 08/04/2010    Past Surgical History:  Procedure Laterality Date  . COLONOSCOPY WITH PROPOFOL N/A 06/26/2016   Procedure: COLONOSCOPY WITH PROPOFOL;  Surgeon: Rogene Houston, MD;  [facility];  Service: Endoscopy;  Laterality: N/A;  10:30  . CORONARY ANGIOPLASTY WITH STENT PLACEMENT    . ESOPHAGOGASTRODUODENOSCOPY N/A 01/18/2013   Procedure: ESOPHAGOGASTRODUODENOSCOPY (EGD);   Surgeon: Rogene Houston, MD;  Location: [facility];  Service: Endoscopy;  Laterality: N/A;  . EUS  03/26/2009   [facility]  . GASTROSTOMY-JEJEUNOSTOMY TUBE CHANGE/PLACEMENT  12/11/2008   [facility]  . I&D EXTREMITY Left 09/12/2012   Procedure: IRRIGATION AND DEBRIDEMENT LEFT FIFTH FINGER;  Surgeon: Roseanne Kaufman, MD;  Location: [facility];  Service: Orthopedics;  Laterality: Left;  . IR FLUORO GUIDE PORT INSERTION RIGHT  08/31/2016  . IR US GUIDE VASC ACCESS RIGHT  08/31/2016  . JEJUNOSTOMY FEEDING TUBE  07/13/2010  . NERVE AND TENDON REPAIR Left 09/12/2012   Procedure: NERVE AND TENDON REPAIR LEFT FIFTH FINGER;  Surgeon: Roseanne Kaufman, MD;  Location: [facility];  Service: Orthopedics;  Laterality: Left;  . OPEN REDUCTION INTERNAL FIXATION (ORIF) METACARPAL Left 09/12/2012   Procedure: OPEN REDUCTION INTERNAL FIXATION LEFT FIFTH FINGER;  Surgeon: Roseanne Kaufman, MD;  Location: [facility];  Service: Orthopedics;  Laterality: Left;  . PANCREATIC PSEUDOCYST DRAINAGE  07/13/2010  . PORTACATH PLACEMENT  11/28/2008   [facility]  . VIDEO BRONCHOSCOPY WITH ENDOBRONCHIAL ULTRASOUND N/A 07/24/2016   Procedure: VIDEO BRONCHOSCOPY WITH ENDOBRONCHIAL ULTRASOUND;  Surgeon: Grace Isaac, MD;  Location: [facility];  Service: Thoracic;  Laterality: N/A;       Home Medications    Prior to Admission medications   Medication Sig Start Date End Date Taking? Authorizing Provider  Adhesive Tape (CLOTH ADHESIVE SURG 1/2"X10YD) TAPE 1 each by Does not apply route 2 (two) times daily as needed. 03/09/16   [name], FNP  albuterol (PROVENTIL HFA;VENTOLIN HFA) 108 (90 Base) MCG/ACT inhaler Inhale 2 puffs into the lungs every 6 (six) hours as needed for wheezing or shortness of breath. 09/01/16   Curcio, Roselie Awkward, NP  amLODipine (NORVASC) 10 MG tablet Take 0.5 tablets (5 mg total) by mouth daily. Patient taking differently: Take 10 mg by mouth daily.  10/04/16   [name], FNP  CREON [phone] units CPEP TAKE  TWO CAPSULES BY MOUTH BEFORE MEALS 08/20/16   [name], FNP  DULoxetine (CYMBALTA) 30 MG capsule Take 1 capsule (30 mg total) by mouth 2 (two) times daily. Patient taking differently: Take 30 mg by mouth daily.  07/28/16   [name], FNP  ferrous sulfate 325 (65 FE) MG tablet TAKE ONE (1) TABLET BY MOUTH EVERY DAY WITH BREAKFAST Patient taking differently: Take 325 mg by mouth daily with breakfast.  06/19/16   [name], FNP  gabapentin (NEURONTIN) 300 MG capsule TAKE ONE (1) CAPSULE BY MOUTH 4 TIMES DAILY AS NEEDED Patient taking differently: TAKE 300 mg three times daily BY MOUTH as needed for NERVE PAIN 08/20/16   [name], FNP  Gauze Pads & Dressings (BIOGUARD GAUZE SPONGES) 4"X4" PADS 1 each by Does not apply route 2 (two) times daily. 03/09/16   [name], FNP  lidocaine-prilocaine (EMLA) cream Apply 1 application topically as needed. 09/01/16   Maryanna Shape, NP  mirtazapine (REMERON) 15 MG tablet TAKE ONE TABLET BY MOUTH EVERY EVENING Patient taking differently: TAKE 15 MG TABLET BY MOUTH EVERY EVENING 09/17/16   [name], FNP  nicotine (NICODERM CQ) 14 mg/24hr patch Place 1 patch (14 mg total) onto the skin daily. Patient not taking: Reported on  10/20/2016 08/27/16   [name], FNP  Nutritional Supplements (FEEDING SUPPLEMENT, VITAL AF 1.2 CAL,) LIQD Place 1,000 mLs into feeding tube daily. At 14ml/hr Patient not taking: Reported on 10/20/2016 05/16/16   Eugenie Filler, MD  ondansetron (ZOFRAN) 4 MG tablet Take 1 tablet (4 mg total) by mouth every 6 (six) hours as needed for nausea. 05/15/16   Eugenie Filler, MD  Oxycodone HCl 10 MG TABS Take 1 tablet (10 mg total) by mouth every 6 (six) hours as needed. Patient taking differently: Take 10 mg by mouth every 6 (six) hours as needed (pain).  09/29/16   Tresa Garter, MD  pantoprazole (PROTONIX) 40 MG tablet TAKE ONE (1) TABLET BY MOUTH EVERY DAY Patient taking differently: take 40  mg tablet, by mouth, once daily 08/20/16   [name], FNP  phenytoin (DILANTIN) 300 MG ER capsule Take 1 capsule (300 mg total) by mouth at bedtime. 10/09/16   Cameron Sprang, MD  potassium chloride SA (K-DUR,KLOR-CON) 20 MEQ tablet Take 1 tablet (20 mEq total) by mouth 2 (two) times daily. 10/15/16   Hedges, Dellis Filbert, PA-C  prochlorperazine (COMPAZINE) 10 MG tablet Take 1 tablet (10 mg total) by mouth every 6 (six) hours as needed for nausea or vomiting. 09/23/16   Curt Bears, MD  promethazine (PHENERGAN) 25 MG tablet Take 25 mg by mouth every 6 (six) hours as needed for nausea or vomiting.     [provider]  ranitidine (ZANTAC) 150 MG capsule TAKE 1 CAPSULE BY MOUTH TWICE DAILY Patient taking differently: take 150 mg capsule, by mouth, twice daily 06/13/15   Micheline Chapman, NP  sucralfate (CARAFATE) 1 GM/10ML suspension Take 10 mLs (1 g total) by mouth 4 (four) times daily -  with meals and at bedtime. 08/25/16   Gery Pray, MD  vitamin C (ASCORBIC ACID) 500 MG tablet Take 1 tablet (500 mg total) by mouth daily. 11/20/15   Micheline Chapman, NP  Water For Irrigation, Sterile (FREE WATER) SOLN Place 200 mLs into feeding tube every 8 (eight) hours. 05/15/16   Eugenie Filler, MD  Wound Dressings (SONAFINE EX) Apply topically.    [provider]    Family History History reviewed. No pertinent family history.  Social History Social History  Substance Use Topics  . Smoking status: Current Every Day Smoker    Packs/day: 0.25    Years: 20.00    Types: Cigarettes  . Smokeless tobacco: Never Used     Comment: 1 pack a day since age 67  . Alcohol use No     Comment: No etoh since 5-6 yrs     Allergies   Latex   Review of Systems Review of Systems  All other systems reviewed and are negative.    Physical Exam Updated Vital Signs BP 116/70 (BP Location: Left Arm)   Pulse 62   Temp 98.6 F (37 C) (Oral)   Resp 16   SpO2 100%   Physical Exam    Constitutional: He is oriented to person, place, and time. He appears cachectic.  Non-toxic appearance. No distress.  HENT:  Head: Normocephalic and atraumatic.  Eyes: Pupils are equal, round, and reactive to light. Conjunctivae, EOM and lids are normal.  Neck: Normal range of motion. Neck supple. No tracheal deviation present. No thyroid mass present.  Cardiovascular: Normal rate, regular rhythm and normal heart sounds.  Exam reveals no gallop.   No murmur heard. Pulmonary/Chest: Effort normal and breath sounds  normal. No stridor. No respiratory distress. He has no decreased breath sounds. He has no wheezes. He has no rhonchi. He has no rales.  Abdominal: Soft. Normal appearance and bowel sounds are normal. He exhibits no distension. There is tenderness in the epigastric area. There is guarding. There is no rigidity, no rebound and no CVA tenderness.    Musculoskeletal: Normal range of motion. He exhibits no edema or tenderness.  Neurological: He is alert and oriented to person, place, and time. He has normal strength. No cranial nerve deficit or sensory deficit. GCS eye subscore is 4. GCS verbal subscore is 5. GCS motor subscore is 6.  Skin: Skin is warm and dry. No abrasion and no rash noted. There is pallor.  Psychiatric: He has a normal mood and affect. His speech is normal and behavior is normal.  Nursing note and vitals reviewed.    ED Treatments / Results  Labs (all labs ordered are listed, but only abnormal results are displayed) Labs Reviewed  LIPASE, BLOOD  COMPREHENSIVE METABOLIC PANEL  CBC  URINALYSIS, ROUTINE W REFLEX MICROSCOPIC  PHENYTOIN LEVEL, TOTAL  I-STAT CG4 LACTIC ACID, ED  TYPE AND SCREEN    EKG  EKG Interpretation None       Radiology No results found.  Procedures Procedures (including critical care time)  Medications Ordered in ED Medications  sodium chloride 0.9 % bolus 1,000 mL (not administered)  0.9 %  sodium chloride infusion (not  administered)  sodium chloride 0.9 % bolus 1,000 mL (not administered)  0.9 %  sodium chloride infusion (not administered)  morphine 4 MG/ML injection 4 mg (not administered)     Initial Impression / Assessment and Plan / ED Course  I have reviewed the triage vital signs and the nursing notes.  Pertinent labs & imaging results that were available during my care of the patient were reviewed by me and considered in my medical decision making (see chart for details).   patient with mild hypokalemia and attempted to give oral potassium which he did vomit. Patient given IV fluids and still feels weak. We'll consult hospitalist for observation admission  Final Clinical Impressions(s) / ED Diagnoses   Final diagnoses:  None    New Prescriptions New Prescriptions   No medications on file     Lacretia Leigh, MD 10/20/16 1926

## 2016-10-20 NOTE — Telephone Encounter (Signed)
Scheduled appt per 10/8 sch message - patient is aware of appt date and time .

## 2016-10-20 NOTE — Progress Notes (Signed)
PHARMACY - ENOXAPARIN   Pharmacy has been asked to adjust enoxaparin (lovenox) dosing as needed in this patient for DVT prophylaxis.    Weight : 43.6 kg CrCl = 63 ml/min  As TBW < 45 kg, will adjust dose according to manufacturer recommendation.  Plan: Adjust Enoxaparin to 30mg  sq q24h  Leone Haven, PharmD

## 2016-10-21 ENCOUNTER — Observation Stay (HOSPITAL_COMMUNITY): Payer: Medicaid Other

## 2016-10-21 ENCOUNTER — Telehealth: Payer: Self-pay | Admitting: Medical

## 2016-10-21 ENCOUNTER — Encounter (HOSPITAL_COMMUNITY): Payer: Self-pay

## 2016-10-21 DIAGNOSIS — Z955 Presence of coronary angioplasty implant and graft: Secondary | ICD-10-CM | POA: Diagnosis not present

## 2016-10-21 DIAGNOSIS — Z79899 Other long term (current) drug therapy: Secondary | ICD-10-CM | POA: Diagnosis not present

## 2016-10-21 DIAGNOSIS — I10 Essential (primary) hypertension: Secondary | ICD-10-CM | POA: Diagnosis not present

## 2016-10-21 DIAGNOSIS — K920 Hematemesis: Secondary | ICD-10-CM | POA: Diagnosis not present

## 2016-10-21 DIAGNOSIS — R1013 Epigastric pain: Secondary | ICD-10-CM | POA: Diagnosis present

## 2016-10-21 DIAGNOSIS — C7889 Secondary malignant neoplasm of other digestive organs: Secondary | ICD-10-CM | POA: Diagnosis present

## 2016-10-21 DIAGNOSIS — R109 Unspecified abdominal pain: Secondary | ICD-10-CM

## 2016-10-21 DIAGNOSIS — I82890 Acute embolism and thrombosis of other specified veins: Secondary | ICD-10-CM | POA: Diagnosis present

## 2016-10-21 DIAGNOSIS — G894 Chronic pain syndrome: Secondary | ICD-10-CM | POA: Diagnosis present

## 2016-10-21 DIAGNOSIS — K861 Other chronic pancreatitis: Secondary | ICD-10-CM | POA: Diagnosis present

## 2016-10-21 DIAGNOSIS — F17210 Nicotine dependence, cigarettes, uncomplicated: Secondary | ICD-10-CM | POA: Diagnosis not present

## 2016-10-21 DIAGNOSIS — J449 Chronic obstructive pulmonary disease, unspecified: Secondary | ICD-10-CM | POA: Diagnosis present

## 2016-10-21 DIAGNOSIS — R112 Nausea with vomiting, unspecified: Secondary | ICD-10-CM | POA: Diagnosis not present

## 2016-10-21 DIAGNOSIS — G40909 Epilepsy, unspecified, not intractable, without status epilepticus: Secondary | ICD-10-CM | POA: Diagnosis present

## 2016-10-21 DIAGNOSIS — R197 Diarrhea, unspecified: Secondary | ICD-10-CM | POA: Diagnosis not present

## 2016-10-21 DIAGNOSIS — K2210 Ulcer of esophagus without bleeding: Secondary | ICD-10-CM | POA: Diagnosis present

## 2016-10-21 DIAGNOSIS — K219 Gastro-esophageal reflux disease without esophagitis: Secondary | ICD-10-CM | POA: Diagnosis present

## 2016-10-21 DIAGNOSIS — C3490 Malignant neoplasm of unspecified part of unspecified bronchus or lung: Secondary | ICD-10-CM | POA: Diagnosis present

## 2016-10-21 DIAGNOSIS — D649 Anemia, unspecified: Secondary | ICD-10-CM | POA: Diagnosis not present

## 2016-10-21 DIAGNOSIS — K559 Vascular disorder of intestine, unspecified: Secondary | ICD-10-CM | POA: Diagnosis present

## 2016-10-21 DIAGNOSIS — Z681 Body mass index (BMI) 19 or less, adult: Secondary | ICD-10-CM | POA: Diagnosis not present

## 2016-10-21 DIAGNOSIS — Z9221 Personal history of antineoplastic chemotherapy: Secondary | ICD-10-CM | POA: Diagnosis not present

## 2016-10-21 DIAGNOSIS — K648 Other hemorrhoids: Secondary | ICD-10-CM | POA: Diagnosis present

## 2016-10-21 DIAGNOSIS — I700 Atherosclerosis of aorta: Secondary | ICD-10-CM | POA: Diagnosis present

## 2016-10-21 DIAGNOSIS — K3189 Other diseases of stomach and duodenum: Secondary | ICD-10-CM | POA: Diagnosis not present

## 2016-10-21 DIAGNOSIS — E876 Hypokalemia: Secondary | ICD-10-CM

## 2016-10-21 DIAGNOSIS — K766 Portal hypertension: Secondary | ICD-10-CM | POA: Diagnosis present

## 2016-10-21 DIAGNOSIS — F1010 Alcohol abuse, uncomplicated: Secondary | ICD-10-CM | POA: Diagnosis present

## 2016-10-21 DIAGNOSIS — R739 Hyperglycemia, unspecified: Secondary | ICD-10-CM | POA: Diagnosis present

## 2016-10-21 DIAGNOSIS — I2510 Atherosclerotic heart disease of native coronary artery without angina pectoris: Secondary | ICD-10-CM | POA: Diagnosis not present

## 2016-10-21 DIAGNOSIS — F79 Unspecified intellectual disabilities: Secondary | ICD-10-CM | POA: Diagnosis present

## 2016-10-21 DIAGNOSIS — Z8673 Personal history of transient ischemic attack (TIA), and cerebral infarction without residual deficits: Secondary | ICD-10-CM | POA: Diagnosis not present

## 2016-10-21 DIAGNOSIS — Z85118 Personal history of other malignant neoplasm of bronchus and lung: Secondary | ICD-10-CM | POA: Diagnosis not present

## 2016-10-21 DIAGNOSIS — I745 Embolism and thrombosis of iliac artery: Secondary | ICD-10-CM | POA: Diagnosis present

## 2016-10-21 DIAGNOSIS — Z931 Gastrostomy status: Secondary | ICD-10-CM | POA: Diagnosis not present

## 2016-10-21 DIAGNOSIS — E43 Unspecified severe protein-calorie malnutrition: Secondary | ICD-10-CM | POA: Diagnosis present

## 2016-10-21 LAB — COMPREHENSIVE METABOLIC PANEL
ALBUMIN: 2.6 g/dL — AB (ref 3.5–5.0)
ALK PHOS: 103 U/L (ref 38–126)
ALT: 12 U/L — AB (ref 17–63)
AST: 15 U/L (ref 15–41)
Anion gap: 3 — ABNORMAL LOW (ref 5–15)
BUN: <5 mg/dL — ABNORMAL LOW (ref 6–20)
CALCIUM: 7.6 mg/dL — AB (ref 8.9–10.3)
CHLORIDE: 107 mmol/L (ref 101–111)
CO2: 25 mmol/L (ref 22–32)
CREATININE: 0.63 mg/dL (ref 0.61–1.24)
GFR calc non Af Amer: >60 mL/min (ref >60)
GLUCOSE: 81 mg/dL (ref 65–99)
Potassium: 3.8 mmol/L (ref 3.5–5.1)
SODIUM: 135 mmol/L (ref 135–145)
Total Bilirubin: 0.2 mg/dL — ABNORMAL LOW (ref 0.3–1.2)
Total Protein: 5.2 g/dL — ABNORMAL LOW (ref 6.5–8.1)

## 2016-10-21 LAB — URINALYSIS, ROUTINE W REFLEX MICROSCOPIC
BILIRUBIN URINE: NEGATIVE
Glucose, UA: NEGATIVE mg/dL
Hgb urine dipstick: NEGATIVE
Ketones, ur: NEGATIVE mg/dL
Leukocytes, UA: NEGATIVE
NITRITE: NEGATIVE
PH: 6 (ref 5.0–8.0)
Protein, ur: NEGATIVE mg/dL
SPECIFIC GRAVITY, URINE: 1.01 (ref 1.005–1.030)

## 2016-10-21 LAB — HEMOGLOBIN A1C
HEMOGLOBIN A1C: 5 % (ref 4.8–5.6)
Mean Plasma Glucose: 96.8 mg/dL

## 2016-10-21 LAB — CBC
HCT: 22.2 % — ABNORMAL LOW (ref 39.0–52.0)
Hemoglobin: 7.4 g/dL — ABNORMAL LOW (ref 13.0–17.0)
MCH: 31.9 pg (ref 26.0–34.0)
MCHC: 33.3 g/dL (ref 30.0–36.0)
MCV: 95.7 fL (ref 78.0–100.0)
PLATELETS: 124 10*3/uL — AB (ref 150–400)
RBC: 2.32 MIL/uL — AB (ref 4.22–5.81)
RDW: 15.1 % (ref 11.5–15.5)
WBC: 5.3 10*3/uL (ref 4.0–10.5)

## 2016-10-21 LAB — MRSA PCR SCREENING: MRSA BY PCR: POSITIVE — AB

## 2016-10-21 MED ORDER — HYDROMORPHONE HCL-NACL 0.5-0.9 MG/ML-% IV SOSY
1.0000 mg | PREFILLED_SYRINGE | INTRAVENOUS | Status: DC | PRN
  Administered 2016-10-21 – 2016-10-22 (×7): 1 mg via INTRAVENOUS
  Filled 2016-10-21 (×7): qty 2

## 2016-10-21 MED ORDER — SODIUM CHLORIDE 0.9% FLUSH
10.0000 mL | INTRAVENOUS | Status: DC | PRN
  Administered 2016-10-22: 10 mL
  Administered 2016-10-24: 20 mL
  Filled 2016-10-21 (×2): qty 40

## 2016-10-21 MED ORDER — CHLORHEXIDINE GLUCONATE CLOTH 2 % EX PADS
6.0000 | MEDICATED_PAD | Freq: Every day | CUTANEOUS | Status: DC
  Administered 2016-10-22 – 2016-10-24 (×3): 6 via TOPICAL

## 2016-10-21 MED ORDER — METRONIDAZOLE IN NACL 5-0.79 MG/ML-% IV SOLN
500.0000 mg | Freq: Three times a day (TID) | INTRAVENOUS | Status: DC
  Administered 2016-10-21 – 2016-10-24 (×9): 500 mg via INTRAVENOUS
  Filled 2016-10-21 (×10): qty 100

## 2016-10-21 MED ORDER — GADOBENATE DIMEGLUMINE 529 MG/ML IV SOLN
10.0000 mL | Freq: Once | INTRAVENOUS | Status: AC | PRN
  Administered 2016-10-21: 9 mL via INTRAVENOUS

## 2016-10-21 MED ORDER — IOPAMIDOL (ISOVUE-300) INJECTION 61%
INTRAVENOUS | Status: AC
  Administered 2016-10-21: 80 mL via INTRAVENOUS
  Filled 2016-10-21: qty 100

## 2016-10-21 MED ORDER — CIPROFLOXACIN IN D5W 400 MG/200ML IV SOLN
400.0000 mg | Freq: Two times a day (BID) | INTRAVENOUS | Status: DC
  Administered 2016-10-21 – 2016-10-23 (×6): 400 mg via INTRAVENOUS
  Filled 2016-10-21 (×6): qty 200

## 2016-10-21 MED ORDER — PANTOPRAZOLE SODIUM 40 MG PO TBEC
40.0000 mg | DELAYED_RELEASE_TABLET | Freq: Two times a day (BID) | ORAL | Status: DC
  Administered 2016-10-21: 40 mg via ORAL
  Filled 2016-10-21: qty 1

## 2016-10-21 MED ORDER — MUPIROCIN 2 % EX OINT
1.0000 "application " | TOPICAL_OINTMENT | Freq: Two times a day (BID) | CUTANEOUS | Status: DC
  Administered 2016-10-21 – 2016-10-23 (×6): 1 via NASAL
  Filled 2016-10-21 (×2): qty 22

## 2016-10-21 MED ORDER — SODIUM CHLORIDE 0.9 % IV SOLN
INTRAVENOUS | Status: DC
  Administered 2016-10-21 – 2016-10-23 (×3): via INTRAVENOUS

## 2016-10-21 MED ORDER — IOPAMIDOL (ISOVUE-300) INJECTION 61%
100.0000 mL | Freq: Once | INTRAVENOUS | Status: AC | PRN
  Administered 2016-10-21: 80 mL via INTRAVENOUS

## 2016-10-21 MED ORDER — ENOXAPARIN SODIUM 60 MG/0.6ML ~~LOC~~ SOLN
1.0000 mg/kg | Freq: Two times a day (BID) | SUBCUTANEOUS | Status: DC
  Filled 2016-10-21 (×2): qty 0.6

## 2016-10-21 MED ORDER — CHOLESTYRAMINE 4 G PO PACK
4.0000 g | PACK | Freq: Every day | ORAL | Status: DC
  Administered 2016-10-21: 4 g via ORAL
  Filled 2016-10-21 (×4): qty 1

## 2016-10-21 MED ORDER — POTASSIUM CHLORIDE 10 MEQ/100ML IV SOLN
10.0000 meq | INTRAVENOUS | Status: DC
  Filled 2016-10-21 (×4): qty 100

## 2016-10-21 NOTE — Progress Notes (Signed)
[facility] Progress Note   TIMMEY LAMBA [id number] October 15, 1960 56 y.o.  Jacque L Kell is managed by Dr. Fanny Bien. 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  Small cell carcinoma of the left upper lobe: The patient is status post cisplatin and etoposide. He is scheduled to have his next chemotherapy on 10/28/2016 and is scheduled to see Dr. Julien Nordmann on 11/03/2016.  Please see After Visit Summary for patient specific instructions.  Future Appointments Date Time Provider [place]  10/26/2016 4:15 PM Gery Pray, MD [facility] None  10/28/2016 9:15 AM [facility] LAB 3 [facility] None  10/28/2016 10:15 AM [facility] H29 [facility] None  10/29/2016 2:00 PM [facility] PROCEDURE 2 [facility] None  10/29/2016 4:45 PM Gery Pray, MD [facility] None  10/30/2016 1:15 PM [facility] I25 DNS [facility] None  11/03/2016 9:15 AM [facility] LAB 1 [facility] None  11/03/2016 9:45 AM Curt Bears, MD [facility] None  11/03/2016 10:15 AM [facility] B4 [facility] None  11/03/2016 3:15 PM Karie Mainland, RD [facility] None  11/04/2016 12:30 PM [facility] A2 [facility] None  11/05/2016 1:45 PM [facility] A2 [facility] None  11/06/2016 1:30 PM [facility] None  12/30/2016 2:30 PM [name], FNP [facility] None  10/04/2017 1:00 PM Cameron Sprang, MD [facility] None    No orders of the defined types were placed in this encounter.      Subjective:   Patient ID:  DENZEL ETIENNE is a 56 y.o. (DOB 1960-04-20) male.  Chief Complaint:  Chief  Complaint  Patient presents with  . Dizziness    HPI DEOVION BATREZ is a 56 year old male with a history of limited stage (T1b, N2, M0) small cell lung cancer diagnosed in July 2018. He presents to the office today with a report of lightheadedness with positional changes, diarrhea 6 days with diarrhea described as watery and foul-smelling, vomiting after all by mouth intake for the past 7-10 days despite his use of 3 anti-emetics, stomach and chest pain, and shortness of breath. The patient has a PEG tube due to the fact that he was not able to eat. He states that he has vomiting after tube feedings and has not had any tube feedings for the past several days. He has a sense of impending doom. He states that he feels that something bad is about to happen. He was last dose of chemotherapy on 09/02/2016.  Medications: I have reviewed the patient's current medications.  Allergies:  Allergies  Allergen Reactions  . Latex Rash    Past Medical History:  Diagnosis Date  . Anxiety states 08/04/2010  . Chronic airway obstruction, not elsewhere classified 08/04/2010  . Chronic pain syndrome 08/04/2010  . Chronic pancreatitis ([place]) 08/04/2010  . Coronary artery disease   . GERD (gastroesophageal reflux disease)   . Hiatal hernia    EGD 01/18/13  . Insomnia 08/04/2010  . Intellectual disability   . Lung nodule 05/20/2016  . Other abnormal glucose 08/04/2010  . Pancreatitis chronic 08/04/2010  . Protein calorie malnutrition ([place]) 2010  . Seizure ([place]) 08/04/2010  . Severe anemia 05/20/2016  . Small cell carcinoma of upper lobe of  left lung ([place]) 07/30/2016  . Splenic vein thrombosis    Chronic.  . Tobacco use disorder 08/04/2010  . Unspecified essential hypertension 08/04/2010    Past Surgical History:  Procedure Laterality Date  . COLONOSCOPY WITH PROPOFOL N/A 06/26/2016   Procedure: COLONOSCOPY WITH PROPOFOL;  Surgeon: Rogene Houston, MD;  [facility];  Service: Endoscopy;   Laterality: N/A;  10:30  . CORONARY ANGIOPLASTY WITH STENT PLACEMENT    . ESOPHAGOGASTRODUODENOSCOPY N/A 01/18/2013   Procedure: ESOPHAGOGASTRODUODENOSCOPY (EGD);  Surgeon: Rogene Houston, MD;  [facility];  Service: Endoscopy;  Laterality: N/A;  . EUS  03/26/2009   [facility]  . GASTROSTOMY-JEJEUNOSTOMY TUBE CHANGE/PLACEMENT  12/11/2008   [facility]  . I&D EXTREMITY Left 09/12/2012   Procedure: IRRIGATION AND DEBRIDEMENT LEFT FIFTH FINGER;  Surgeon: Roseanne Kaufman, MD;  Location: [place];  Service: Orthopedics;  Laterality: Left;  . IR FLUORO GUIDE PORT INSERTION RIGHT  08/31/2016  . IR US GUIDE VASC ACCESS RIGHT  08/31/2016  . JEJUNOSTOMY FEEDING TUBE  07/13/2010  . NERVE AND TENDON REPAIR Left 09/12/2012   Procedure: NERVE AND TENDON REPAIR LEFT FIFTH FINGER;  Surgeon: Roseanne Kaufman, MD;  Location: [place];  Service: Orthopedics;  Laterality: Left;  . OPEN REDUCTION INTERNAL FIXATION (ORIF) METACARPAL Left 09/12/2012   Procedure: OPEN REDUCTION INTERNAL FIXATION LEFT FIFTH FINGER;  Surgeon: Roseanne Kaufman, MD;  Location: [place];  Service: Orthopedics;  Laterality: Left;  . PANCREATIC PSEUDOCYST DRAINAGE  07/13/2010  . PORTACATH PLACEMENT  11/28/2008   [facility]  . VIDEO BRONCHOSCOPY WITH ENDOBRONCHIAL ULTRASOUND N/A 07/24/2016   Procedure: VIDEO BRONCHOSCOPY WITH ENDOBRONCHIAL ULTRASOUND;  Surgeon: Grace Isaac, MD;  Location: [facility] OR;  Service: Thoracic;  Laterality: N/A;    Family History  Problem Relation Age of Onset  . Alcoholism Father   . Alcoholism Mother     Social History   Social History  . Marital status: Married    Spouse name: N/A  . Number of children: N/A  . Years of education: N/A   Occupational History  . Not on file.   Social History Main Topics  . Smoking status: Current Every Day Smoker    Packs/day: 0.25    Years: 20.00    Types: Cigarettes  . Smokeless tobacco: Never Used     Comment: 1 pack a day since age 67  . Alcohol use No      Comment: No etoh since 5-6 yrs  . Drug use: No  . Sexual activity: No   Other Topics Concern  . Not on file   Social History Narrative   Intellectual impairment, illiterate.     Past Medical History, Surgical history, Social history, and Family history were reviewed and updated as appropriate.   Please see review of systems for further details on the patient's review from today.   Review of Systems:  Review of Systems  Constitutional: Positive for appetite change, fatigue and unexpected weight change. Negative for chills, diaphoresis and fever.  Gastrointestinal: Positive for diarrhea, nausea and vomiting. Negative for constipation.  Neurological: Positive for dizziness and weakness.    Objective:   Physical Exam:  BP 125/65 (BP Location: Left Arm, Patient Position: Sitting)   Pulse 77   Temp 98.2 F (36.8 C) (Oral)   Resp 20   Ht 5\' 6"  (1.676 m)   Wt 96 lb 1.6 oz (43.6 kg)   SpO2 100%   BMI 15.51 kg/m  ECOG: 2  Physical Exam  Constitutional:  No distress.  HENT:  Head: Normocephalic and atraumatic.  Eyes: Right eye exhibits no discharge. Left eye exhibits no discharge.  Cardiovascular: Normal rate, regular rhythm and normal heart sounds.  Exam reveals no gallop and no friction rub.   No murmur heard. Pulmonary/Chest: Effort normal and breath sounds normal. No respiratory distress. He has no wheezes. He has no rales.  Abdominal: Soft. Bowel sounds are normal. He exhibits no distension. There is no tenderness. There is no rebound and no guarding.    Musculoskeletal: He exhibits no edema.  Skin: Skin is warm and dry. He is not diaphoretic.    Lab Review:     Component Value Date/Time   NA 135 10/21/2016 0955   NA 140 10/20/2016 1124   K 3.8 10/21/2016 0955   K 3.3 (L) 10/20/2016 1124   CL 107 10/21/2016 0955   CO2 25 10/21/2016 0955   CO2 25 10/20/2016 1124   GLUCOSE 81 10/21/2016 0955   GLUCOSE 95 10/20/2016 1124   BUN <5 (L) 10/21/2016 0955   BUN 6.5  (L) 10/20/2016 1124   CREATININE 0.63 10/21/2016 0955   CREATININE 0.8 10/20/2016 1124   CALCIUM 7.6 (L) 10/21/2016 0955   CALCIUM 8.9 10/20/2016 1124   PROT 5.2 (L) 10/21/2016 0955   PROT 6.8 10/20/2016 1124   ALBUMIN 2.6 (L) 10/21/2016 0955   ALBUMIN 3.2 (L) 10/20/2016 1124   AST 15 10/21/2016 0955   AST 16 10/20/2016 1124   ALT 12 (L) 10/21/2016 0955   ALT 11 10/20/2016 1124   ALKPHOS 103 10/21/2016 0955   ALKPHOS 150 10/20/2016 1124   BILITOT 0.2 (L) 10/21/2016 0955   BILITOT 0.37 10/20/2016 1124   GFRNONAA >60 10/21/2016 0955   GFRNONAA >89 07/10/2016 1420   GFRAA >60 10/21/2016 0955   GFRAA >89 07/10/2016 1420       Component Value Date/Time   WBC 5.3 10/21/2016 0955   RBC 2.32 (L) 10/21/2016 0955   HGB 7.4 (L) 10/21/2016 0955   HGB 10.1 (L) 10/20/2016 1124   HCT 22.2 (L) 10/21/2016 0955   HCT 29.7 (L) 10/20/2016 1124   PLT 124 (L) 10/21/2016 0955   PLT 190 10/20/2016 1124   MCV 95.7 10/21/2016 0955   MCV 96.5 10/20/2016 1124   MCH 31.9 10/21/2016 0955   MCHC 33.3 10/21/2016 0955   RDW 15.1 10/21/2016 0955   RDW 15.7 (H) 10/20/2016 1124   LYMPHSABS 1.1 10/20/2016 1124   MONOABS 0.5 10/20/2016 1124   EOSABS 0.1 10/20/2016 1124   BASOSABS 0.1 10/20/2016 1124   -------------------------------  Imaging from last 24 hours (if applicable):  Radiology interpretation: Dg Chest 2 View  Result Date: 10/15/2016 CLINICAL DATA:  Short of breath yesterday EXAM: CHEST  2 VIEW COMPARISON:  07/24/2016 FINDINGS: Right jugular Port-A-Cath placed. Tip is at the cavoatrial junction. Normal heart size. Lungs hyperaerated and clear. No pneumothorax. IMPRESSION: No active cardiopulmonary disease. Electronically Signed   By: Marybelle Killings M.D.   On: 10/15/2016 15:13   Ct Abdomen Pelvis W Contrast  Result Date: 10/21/2016 CLINICAL DATA:  Initial evaluation for acute epigastric pain, nausea, vomiting, diarrhea for 1 week. EXAM: CT ABDOMEN AND PELVIS WITH CONTRAST TECHNIQUE:  Multidetector CT imaging of the abdomen and pelvis was performed using the standard protocol following bolus administration of intravenous contrast. CONTRAST:  80 cc of Isovue-300. COMPARISON:  Prior CT from 06/16/2016. FINDINGS: Lower chest: Mild subsegmental atelectasis present within the lung bases. Visualized lungs are otherwise clear. Hepatobiliary: Scattered calcified granulomas present  within liver. Prominent periportal edema. Liver otherwise unremarkable. Gallbladder within normal limits. No biliary dilatation. Pancreas: Scattered calcifications present within the pancreas, consistent with chronic pancreatitis. Pancreas itself is somewhat ill-defined, similar to previous. A superimposed acute component would be difficult to exclude, although no other significant inflammatory changes seen about the pancreas on today's exam. No very an crowded fluid collections. Spleen: Multiple granulomas noted within the spleen. Spleen otherwise unremarkable. Adrenals/Urinary Tract: Adrenal glands are normal. Kidneys equal in size with symmetric enhancement. No nephrolithiasis, hydronephrosis, or focal enhancing renal mass. subcentimeter hypodensity within left kidney too small the characterize, but statistically likely reflects a small cyst. No hydroureter. Bladder within normal limits. Stomach/Bowel: Circumferential wall thickening seen about the distal esophagus. Small hiatal hernia. Percutaneous G-tube in place within the stomach, and appears appropriately positioned. No evidence for bowel obstruction. Enteric contrast material reaches the level of the rectum. Circumferential wall thickening seen diffusely throughout the colon. While this may be related to incomplete distension, possible acute colitis could also have this appearance. No other significant inflammatory changes about the bowels. Vascular/Lymphatic: Extensive atherosclerotic changes stenosis throughout the intra- abdominal aorta and its branch vessels. No  aortic aneurysm. Secondary stenosis at the origin of the celiac access, left renal artery common likely IMA. Severe stenosis versus occlusion with distal reconstitution of the bilateral external iliac vessels similar to previous. 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 Finding likely related to chronic pancreatitis. Shotty retroperitoneal lymph nodes similar to previous. No new adenopathy. Reproductive: Mild prostatic enlargement, similar. Other: No free intraperitoneal air. Small volume free fluid within the abdomen and pelvis, new from previous. Musculoskeletal: No acute osseus abnormality. No worrisome lytic or blastic osseous lesions. IMPRESSION: 1. Diffuse wall thickening about the colon. While this finding may be related to incomplete distension, possible acute colitis could also have this appearance. 2. Sequelae of chronic pancreatitis. While this is largely chronic in appearance, a superimposed acute component would be extremely difficult to exclude. Correlation with serum lipase recommended. 3. Small volume free fluid within the abdomen and pelvis, new from prior. 4. Percutaneous G-tube in place within the stomach. 5. 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 This is likely related to chronic pancreatitis. 6. Severe atherosclerotic disease. Secondary severe stenosis and/or occlusion with distal reconstitution involving the bilateral external iliac vessels, similar to previous. 7. Circumferential wall thickening about the distal esophagus. Finding may reflect sequelae of reflux disease or possibly esophagitis. Electronically Signed   By: Jeannine Boga M.D.   On: 10/21/2016 03:08   Mr Jodene Nam Abdomen W Wo Contrast  Result Date: 10/21/2016 CLINICAL DATA:  57 year old male with possible mesenteric ischemia EXAM: MRI ABDOMEN WITHOUT AND WITH CONTRAST TECHNIQUE: Multiplanar multisequence MR  imaging of the abdomen was performed both before and after the administration of intravenous contrast. CONTRAST:  12mL MULTIHANCE GADOBENATE DIMEGLUMINE 529 MG/ML IV SOLN COMPARISON:  CT 10/21/2016, 09/18/2016 FINDINGS: Lower chest: No acute findings. Hepatobiliary: Unremarkable appearance of liver parenchyma. Evidence of granulomatous disease demonstrated on prior CT. Unremarkable gallbladder. Pancreas: Pancreas not well evaluated. Known changes of chronic pancreatitis better characterized on prior CT imaging. Spleen: Multiple foci of susceptibility throughout the spleen, compatible with changes of prior granulomatous disease. Adrenals/Urinary Tract:  Unremarkable bilateral adrenal glands. Bilateral kidneys demonstrate symmetric perfusion with no hydronephrosis. Stomach/Bowel: Unremarkable stomach and small bowel. Unremarkable visualized colon. Vascular: Aorta: No aneurysm. No dissection flap. Irregular lumen of the abdominal aorta compatible with atherosclerotic changes. Segmental vessels  of the lower chest and the abdomen patent (intercostal and lumbar). Lower extremity: Irregular lumen of the proximal left common iliac artery compatible with atherosclerotic changes. Re- demonstration of left external iliac artery occlusion with patent hypogastric artery at the origin. Pelvic vessels not well evaluated. Re- demonstration of occluded right common iliac artery. Celiac artery: Celiac artery is patent with irregular and tortuous appearance of the origin compatible with atherosclerotic changes. There is perhaps 50% stenosis at the origin. The branch vessels of common hepatic artery and splenic artery are patent. Superior mesenteric artery: SMA is patent at the origin, with stenotic segment just beyond the origin of approximately 50%. Branch vessels remain patent. Inferior mesenteric artery: Signal maintained within the IMA beyond the origin, likely with high-grade stenosis of the origin. Renal arteries: Single right  renal artery. No significant stenosis of the origin the right renal artery. Caliber maintained throughout the right renal artery. There are at least 2 left renal artery, both of which appears stenotic at the origin. 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 The MRI again demonstrates engorged left gonadal vein, better characterized on prior CT. 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 There appears to be 50% stenosis of the celiac origin just beyond the SMA origin, with likely high-grade stenosis of the IMA origin. 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 These results were called by telephone at the time of interpretation on 10/21/2016 at 3:15 pm to Dr. Jani Gravel , who verbally acknowledged these results. There appears to be developing stenoses of the 2 left-sided renal arteries. No significant stenosis appreciated at the origin of the right renal artery. Perfusion on this study appears symmetric. 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 Changes of chronic pancreatitis better characterized on prior CT imaging. 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 Signed, Dulcy Fanny. Earleen Newport, DO [facility] Electronically Signed   By: Corrie Mckusick D.O.   On: 10/21/2016 15:19

## 2016-10-21 NOTE — Telephone Encounter (Signed)
Per 10/9 - no los at checkout

## 2016-10-21 NOTE — Progress Notes (Addendum)
Pt. Finished drinking contrast for CT scan. Attempted to call CT to find out if pt. Can be transported down for CT scan. No answer. Will attempt again later. Pt. Made aware. Will continue to monitor.

## 2016-10-21 NOTE — Progress Notes (Signed)
  Initial Nutrition Assessment  DOCUMENTATION CODES:   Underweight, Severe malnutrition in context of chronic illness  INTERVENTION:   Vital AF 1.2 @ 35 ml/hr  -Increase 10 ml/hr every 8 hours to goal rate of 55 ml/hr  This provides 1584 calories, 99 grams of protein, and 1069 ml of free water. This meets 99% of calorie needs and 100% of protein needs.   NUTRITION DIAGNOSIS:   Malnutrition (Severe) related to chronic illness, cancer and cancer related treatments as evidenced by severe depletion of body fat, severe depletion of muscle mass, 5.8% wt loss in one month.   GOAL:   Patient will meet greater than or equal to 90% of their needs  MONITOR:   PO intake, Supplement acceptance, Weight trends, Labs, TF tolerance  REASON FOR ASSESSMENT:   Malnutrition Screening Tool    ASSESSMENT:   Pt with PMH significant for small lung cancer (diagnosed 07/2016), chronic airway obstruction, chronic pancreatitis, CAD, HTN, alcohol abuse, and seizures. Presents this admission with diarrhea resulting in hypokalemia.    Spoke with pt at bedside. Reports discontinuing his tube feeding one week ago related to feelings of "drowning." States for the past week he would vomit tan color liquid after tube feeding was admisintered. Unsure if his recurring vomiting was related to tube feeding volume or from another etiology.Pt was seen by cancer center RD 9/11, who recommended  Vital 1.2 at 95 mL an hour over 14 hours via PEG. Pt would eat throughout the day, typically consuming one meal.   Records show fluctuating wts since 07/2016. Pt was recorded to weight 96 lb upon admission and 102 lb exactly one month ago. This shows a 5.8% wt loss in one month which is significant.   Nutrition-Focused physical exam completed. Findings are severe fat depletion, severe muscle depletion, and no edema.   Given pt complaints about tube feeding, would recommend running tube feeding continuous this hospital stay to  monitor toleration. Once pt can tolerate continuous we can transition him back to a normal schedule.   Medications reviewed and include: ferrous sulfate, KCl, carafate Labs reviewed: BUN <5 (L)   Diet Order:  Diet clear liquid Room service appropriate? Yes; Fluid consistency: Thin  Skin:  Reviewed, no issues  Last BM:  10/21/16  Height:   Ht Readings from Last 1 Encounters:  10/20/16 5\' 6"  (1.676 m)    Weight:   Wt Readings from Last 1 Encounters:  10/21/16 107 lb 5.8 oz (48.7 kg)    Ideal Body Weight:  64.5 kg  BMI:  Body mass index is 17.33 kg/m.  Estimated Nutritional Needs:   Kcal:  1600-1800 (33-37 kcal/kg)  Protein:  80-95 grams (1.6-2 g/kg)  Fluid:  >1.6 L/day  EDUCATION NEEDS:   Education needs addressed  [place], LDN Clinical Nutrition Pager # [phone]

## 2016-10-21 NOTE — Progress Notes (Signed)
This RN was notified by CCMD that patient had a 22 beat run of Vtach. Strip evaluated and was not true Vtach. Upon checking on patient, pt. Was moving around in bed causing artifact. VS taken and stable. Pt. Has no c/o of SOB, asymptomatic. Will continue to monitor.

## 2016-10-21 NOTE — Progress Notes (Signed)
Patient ID: Marcus Brooks, male   DOB: December 01, 1960, 56 y.o.   MRN: [id number]                                                                PROGRESS NOTE                                                                                                                                                                                                             Patient Demographics:    Marcus Brooks, is a 56 y.o. male, DOB - Apr 19, 1960, [id number]  Admit date - 10/20/2016   Admitting Physician Jani Gravel, MD  Outpatient Primary MD for the patient is [name], FNP  LOS - 0  Outpatient Specialists: Curt Bears   Chief Complaint  Patient presents with  . Emesis  . Nausea  . Diarrhea       Brief Narrative  56 y.o. male, w small cell lung cancer dx 07/2016, prior colonoscopy 06/26/2016 => int hemorrhoids ,  apparently c/o diarrhea for the past 1 week. And epigastric discomfort.  + n/v. + hearrtburn.   No bloody emesis.  Denies fever, chills, constipation, brbpr. Pt presented due to abdominal discomfort  In ED, wbc 7.4, Hgb 10.1, Plt 190,  Na 140, K 3.3 , Bun 6.5, Creatinine 0.8.  Pt will be admitted for hypokalemia, diarrhea, n/v.     Subjective:    [name] today has no further n/v, but does continue to have abdominal discomfort as well as diarrhea.  liquid stool 3x yesterda.  Denies constipation, brbpr.    No headache, No chest pain,No new weakness tingling or numbness, No Cough - SOB.    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 Mildred bid  Hypokalemia Replete Check cmp , magnesium in am  Hyperglycemia hga1c  Gerd Change from  protonix iv to protonix 40mg  po bid Cont sucralfate  Pancreatic insufficiency Consider restart on Creon tomorrow   Nausea Zofran/compazie  Anemia Cont ferrous fulate  Seizure do Cont dilantin  Hypertension Cont amlodipine     Code Status : FULL CODE  Family Communication  : w patient  Disposition Plan  : home  Barriers For Discharge :   Consults  :  none  Procedures  : CT scan abd/ pelvis 10/20/2016  DVT Prophylaxis  :  Lovenox -SCDs   Lab Results  Component Value Date   PLT 156 10/20/2016    Antibiotics  :   Cipro/flagyl 10/10=>   Anti-infectives    None        Objective:   Vitals:   10/20/16 2100 10/20/16 2130 10/20/16 2207 10/21/16 0428  BP: (!) 127/95 125/74 140/72 98/61  Pulse: (!) 59 (!) 58 66 (!) 56  Resp: 13 15 14 16   Temp:   97.6 F (36.4 C) (!) 97.5 F (36.4 C)  TempSrc:   Oral Oral  SpO2:   100% 100%  Weight:   45.8 kg (100 lb 15.5 oz) 48.7 kg (107 lb 5.8 oz)  Height:   5\' 6"  (1.676 m)     Wt Readings from Last 3 Encounters:  10/21/16 48.7 kg (107 lb 5.8 oz)  10/20/16 43.6 kg (96 lb 1.6 oz)  10/15/16 45.4 kg (100 lb)     Intake/Output Summary (Last 24 hours) at 10/21/16 1007 Last data filed at 10/21/16 1000  Gross per 24 hour  Intake          3040.83 ml  Output              175 ml  Net          2865.83 ml     Physical Exam  Awake Alert, Oriented X 3, No new F.N deficits, Normal affect [place].AT,PERRAL Supple Neck,No JVD, No cervical lymphadenopathy appriciated.  Symmetrical Chest wall movement, Good air movement bilaterally, CTAB RRR,No Gallops,Rubs or new Murmurs, No Parasternal Heave +ve B.Sounds, Abd Soft, No tenderness, No organomegaly appriciated, No rebound - guarding or rigidity. No Cyanosis, Clubbing or edema, No new Rash or bruise   Porta cath in right upper chest + tattos    Data Review:    CBC  Recent Labs Lab 10/15/16 1343 10/20/16 1124 10/20/16 1740  WBC 6.8 7.4 6.5  HGB 9.7* 10.1* 8.9*  HCT  27.8* 29.7* 25.7*  PLT 210 190 156  MCV 94.9 96.5 95.9  MCH 33.1 32.8 33.2  MCHC 34.9 34.0 34.6  RDW 15.8* 15.7* 15.2  LYMPHSABS  --  1.1  --   MONOABS  --  0.5  --   EOSABS  --  0.1  --   BASOSABS  --  0.1  --     Chemistries   Recent Labs Lab 10/15/16 1343 10/20/16 1124 10/20/16 1740  NA 141 140 137  K 2.9* 3.3* 3.0*  CL 104  --  103  CO2 27 25 26   GLUCOSE 118* 95 206*  BUN 6 6.5* 8  CREATININE 0.73 0.8 0.77  CALCIUM 8.5* 8.9 8.3*  MG  --  1.7  --   AST 22 16 18   ALT 13* 11 13*  ALKPHOS 124 150 139*  BILITOT 0.6 0.37 0.3   ------------------------------------------------------------------------------------------------------------------ No results for input(s): CHOL, HDL, LDLCALC, TRIG, CHOLHDL, LDLDIRECT in the last 72 hours.  Lab Results  Component Value Date   HGBA1C 4.8 09/27/2015   ------------------------------------------------------------------------------------------------------------------ No results for input(s): TSH, T4TOTAL, T3FREE, THYROIDAB in the last 72 hours.  Invalid input(s): FREET3 ------------------------------------------------------------------------------------------------------------------ No results for input(s): VITAMINB12, FOLATE, FERRITIN, TIBC, IRON,  RETICCTPCT in the last 72 hours.  Coagulation profile No results for input(s): INR, PROTIME in the last 168 hours.  No results for input(s): DDIMER in the last 72 hours.  Cardiac Enzymes No results for input(s): CKMB, TROPONINI, MYOGLOBIN in the last 168 hours.  Invalid input(s): CK ------------------------------------------------------------------------------------------------------------------ No results found for: BNP  Inpatient Medications  Scheduled Meds: . amLODipine  5 mg Oral Daily  . chlorhexidine  15 mL Mouth Rinse BID  . DULoxetine  30 mg Oral BID  . enoxaparin (LOVENOX) injection  30 mg Subcutaneous Q24H  . ferrous sulfate  325 mg Oral Q breakfast  . gabapentin   300 mg Oral QID  . iopamidol      . mouth rinse  15 mL Mouth Rinse q12n4p  . mirtazapine  15 mg Oral QPM  . pantoprazole (PROTONIX) IV  40 mg Intravenous Q12H  . phenytoin  300 mg Oral QHS  . potassium chloride SA  20 mEq Oral BID  . sucralfate  1 g Oral TID WC & HS   Continuous Infusions: . sodium chloride 125 mL/hr at 10/20/16 2041  . sodium chloride 75 mL/hr at 10/21/16 0620   PRN Meds:.acetaminophen **OR** acetaminophen, albuterol, HYDROmorphone (DILAUDID) injection, ondansetron (ZOFRAN) IV, prochlorperazine, sodium chloride flush  Micro Results No results found for this or any previous visit (from the past 240 hour(s)).  Radiology Reports Dg Chest 2 View  Result Date: 10/15/2016 CLINICAL DATA:  Short of breath yesterday EXAM: CHEST  2 VIEW COMPARISON:  07/24/2016 FINDINGS: Right jugular Port-A-Cath placed. Tip is at the cavoatrial junction. Normal heart size. Lungs hyperaerated and clear. No pneumothorax. IMPRESSION: No active cardiopulmonary disease. Electronically Signed   By: Marybelle Killings M.D.   On: 10/15/2016 15:13   Ct Abdomen Pelvis W Contrast  Result Date: 10/21/2016 CLINICAL DATA:  Initial evaluation for acute epigastric pain, nausea, vomiting, diarrhea for 1 week. EXAM: CT ABDOMEN AND PELVIS WITH CONTRAST TECHNIQUE: Multidetector CT imaging of the abdomen and pelvis was performed using the standard protocol following bolus administration of intravenous contrast. CONTRAST:  80 cc of Isovue-300. COMPARISON:  Prior CT from 06/16/2016. FINDINGS: Lower chest: Mild subsegmental atelectasis present within the lung bases. Visualized lungs are otherwise clear. Hepatobiliary: Scattered calcified granulomas present within liver. Prominent periportal edema. Liver otherwise unremarkable. Gallbladder within normal limits. No biliary dilatation. Pancreas: Scattered calcifications present within the pancreas, consistent with chronic pancreatitis. Pancreas itself is somewhat ill-defined,  similar to previous. A superimposed acute component would be difficult to exclude, although no other significant inflammatory changes seen about the pancreas on today's exam. No very an crowded fluid collections. Spleen: Multiple granulomas noted within the spleen. Spleen otherwise unremarkable. Adrenals/Urinary Tract: Adrenal glands are normal. Kidneys equal in size with symmetric enhancement. No nephrolithiasis, hydronephrosis, or focal enhancing renal mass. subcentimeter hypodensity within left kidney too small the characterize, but statistically likely reflects a small cyst. No hydroureter. Bladder within normal limits. Stomach/Bowel: Circumferential wall thickening seen about the distal esophagus. Small hiatal hernia. Percutaneous G-tube in place within the stomach, and appears appropriately positioned. No evidence for bowel obstruction. Enteric contrast material reaches the level of the rectum. Circumferential wall thickening seen diffusely throughout the colon. While this may be related to incomplete distension, possible acute colitis could also have this appearance. No other significant inflammatory changes about the bowels. Vascular/Lymphatic: Extensive atherosclerotic changes stenosis throughout the intra- abdominal aorta and its branch vessels. No aortic aneurysm. 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 Severe stenosis versus occlusion with distal reconstitution of the bilateral external iliac vessels similar to previous. 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 Finding likely related to chronic pancreatitis. Shotty retroperitoneal lymph nodes similar to previous. No new adenopathy. Reproductive: Mild prostatic enlargement, similar. Other: No free intraperitoneal air. Small volume free fluid within the abdomen and pelvis, new from previous. Musculoskeletal: No acute osseus abnormality. No  worrisome lytic or blastic osseous lesions. IMPRESSION: 1. Diffuse wall thickening about the colon. While this finding may be related to incomplete distension, possible acute colitis could also have this appearance. 2. Sequelae of chronic pancreatitis. While this is largely chronic in appearance, a superimposed acute component would be extremely difficult to exclude. Correlation with serum lipase recommended. 3. Small volume free fluid within the abdomen and pelvis, new from prior. 4. Percutaneous G-tube in place within the stomach. 5. 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 This is likely related to chronic pancreatitis. 6. Severe atherosclerotic disease. Secondary severe stenosis and/or occlusion with distal reconstitution involving the bilateral external iliac vessels, similar to previous. 7. Circumferential wall thickening about the distal esophagus. Finding may reflect sequelae of reflux disease or possibly esophagitis. Electronically Signed   By: Jeannine Boga M.D.   On: 10/21/2016 03:08    Time Spent in minutes  30   Jani Gravel M.D on 10/21/2016 at 10:07 AM  Between 7am to 7pm - Pager - [phone]  After 7pm go to [email or web address] - password [facility]  [facility] -  Office  [phone]

## 2016-10-22 ENCOUNTER — Other Ambulatory Visit: Payer: Self-pay | Admitting: Family Medicine

## 2016-10-22 ENCOUNTER — Encounter (HOSPITAL_COMMUNITY): Payer: Self-pay | Admitting: General Surgery

## 2016-10-22 DIAGNOSIS — R109 Unspecified abdominal pain: Secondary | ICD-10-CM

## 2016-10-22 DIAGNOSIS — I82890 Acute embolism and thrombosis of other specified veins: Secondary | ICD-10-CM

## 2016-10-22 DIAGNOSIS — K920 Hematemesis: Secondary | ICD-10-CM

## 2016-10-22 LAB — CBC
HCT: 23.1 % — ABNORMAL LOW (ref 39.0–52.0)
HEMOGLOBIN: 7.9 g/dL — AB (ref 13.0–17.0)
MCH: 33.2 pg (ref 26.0–34.0)
MCHC: 34.2 g/dL (ref 30.0–36.0)
MCV: 97.1 fL (ref 78.0–100.0)
Platelets: 120 10*3/uL — ABNORMAL LOW (ref 150–400)
RBC: 2.38 MIL/uL — AB (ref 4.22–5.81)
RDW: 15 % (ref 11.5–15.5)
WBC: 8.7 10*3/uL (ref 4.0–10.5)

## 2016-10-22 LAB — COMPREHENSIVE METABOLIC PANEL
ALK PHOS: 105 U/L (ref 38–126)
ALT: 10 U/L — AB (ref 17–63)
AST: 12 U/L — ABNORMAL LOW (ref 15–41)
Albumin: 2.5 g/dL — ABNORMAL LOW (ref 3.5–5.0)
Anion gap: 4 — ABNORMAL LOW (ref 5–15)
BUN: <5 mg/dL — ABNORMAL LOW (ref 6–20)
CO2: 25 mmol/L (ref 22–32)
CREATININE: 0.6 mg/dL — AB (ref 0.61–1.24)
Calcium: 7.8 mg/dL — ABNORMAL LOW (ref 8.9–10.3)
Chloride: 107 mmol/L (ref 101–111)
Glucose, Bld: 78 mg/dL (ref 65–99)
Potassium: 3.9 mmol/L (ref 3.5–5.1)
Sodium: 136 mmol/L (ref 135–145)
Total Bilirubin: 0.5 mg/dL (ref 0.3–1.2)
Total Protein: 5.2 g/dL — ABNORMAL LOW (ref 6.5–8.1)

## 2016-10-22 LAB — OCCULT BLOOD GASTRIC / DUODENUM (SPECIMEN CUP)
Occult Blood, Gastric: POSITIVE — AB
pH, Gastric: 2

## 2016-10-22 LAB — MAGNESIUM: MAGNESIUM: 1.2 mg/dL — AB (ref 1.7–2.4)

## 2016-10-22 MED ORDER — HYDROMORPHONE HCL-NACL 0.5-0.9 MG/ML-% IV SOSY
1.0000 mg | PREFILLED_SYRINGE | INTRAVENOUS | Status: DC | PRN
  Administered 2016-10-22 – 2016-10-24 (×13): 1 mg via INTRAVENOUS
  Filled 2016-10-22 (×13): qty 2

## 2016-10-22 MED ORDER — PANTOPRAZOLE SODIUM 40 MG IV SOLR
40.0000 mg | Freq: Two times a day (BID) | INTRAVENOUS | Status: DC
  Administered 2016-10-22 – 2016-10-23 (×4): 40 mg via INTRAVENOUS
  Filled 2016-10-22 (×4): qty 40

## 2016-10-22 NOTE — Consult Note (Signed)
Chief Complaint: abdominal pain  Referring Physician:Dr. Jani Gravel  Supervising Physician: Jacqulynn Cadet  Patient Status: [facility] - In-pt  HPI: Marcus Brooks is a 56 y.o. male who has a history of lung cancer as well as chronic pancreatitis who presented to the ED 2 days ago secondary to an acute onset of LUQ abdominal pain about 1 week ago.  This was associated with diarrhea, but no blood was in his stool.  He denies any nausea or vomiting.    After admission he had a CT scan that revealed diffuse wall thickening of the colon c/w colitis.  He also had sequelae of chronic pancreatitis, but superimposed acute component can not be rule out (although lipase is normal), possible splenic vein thrombosis and severe atherosclerotic disease.  He then had an MRA which revealed "The MR demonstrates changes of aortic atherosclerosis and mesenteric disease, with patent - though stenotic - celiac artery origin, SMA, and IMA. There appears to be 50% stenosis of the celiac origin just beyond the SMA origin, with likely high-grade stenosis of the IMA origin. 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  Past Medical History:  Past Medical History:  Diagnosis Date  . Anxiety states 08/04/2010  . Chronic airway obstruction, not elsewhere classified 08/04/2010  . Chronic pain syndrome 08/04/2010  . Chronic pancreatitis ([place]) 08/04/2010  . Coronary artery disease   . GERD (gastroesophageal reflux disease)   . Hiatal hernia    EGD 01/18/13  . Insomnia 08/04/2010  . Intellectual disability   . Lung nodule 05/20/2016  . Other abnormal glucose 08/04/2010  . Pancreatitis chronic 08/04/2010  . Protein calorie malnutrition ([place]) 2010  . Seizure ([place]) 08/04/2010  . Severe anemia 05/20/2016   . 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 Splenic vein thrombosis    Chronic.  . Tobacco use disorder 08/04/2010  . Unspecified essential hypertension 08/04/2010    Past Surgical History:  Past Surgical History:  Procedure Laterality Date  . COLONOSCOPY WITH PROPOFOL N/A 06/26/2016   Procedure: COLONOSCOPY WITH PROPOFOL;  Surgeon: Rogene Houston, MD;  [facility];  Service: Endoscopy;  Laterality: N/A;  10:30  . CORONARY ANGIOPLASTY WITH STENT PLACEMENT    . ESOPHAGOGASTRODUODENOSCOPY N/A 01/18/2013   Procedure: ESOPHAGOGASTRODUODENOSCOPY (EGD);  Surgeon: Rogene Houston, MD;  [facility];  Service: Endoscopy;  Laterality: N/A;  . EUS  03/26/2009   [facility]  . GASTROSTOMY-JEJEUNOSTOMY TUBE CHANGE/PLACEMENT  12/11/2008   [facility]ICK  . I&D EXTREMITY Left 09/12/2012   Procedure: IRRIGATION AND DEBRIDEMENT LEFT FIFTH FINGER;  Surgeon: Roseanne Kaufman, MD;  Location: [place];  Service: Orthopedics;  Laterality: Left;  . IR FLUORO GUIDE PORT INSERTION RIGHT  08/31/2016  . IR US GUIDE VASC ACCESS RIGHT  08/31/2016  . JEJUNOSTOMY FEEDING TUBE  07/13/2010  . NERVE AND TENDON REPAIR Left 09/12/2012   Procedure: NERVE AND TENDON REPAIR LEFT FIFTH FINGER;  Surgeon: Roseanne Kaufman, MD;  Location: [place];  Service: Orthopedics;  Laterality: Left;  . OPEN REDUCTION INTERNAL FIXATION (ORIF) METACARPAL Left 09/12/2012   Procedure: OPEN REDUCTION INTERNAL FIXATION LEFT FIFTH FINGER;  Surgeon: Roseanne Kaufman, MD;  Location: [place];  Service: Orthopedics;  Laterality: Left;  . PANCREATIC PSEUDOCYST DRAINAGE  07/13/2010  . PORTACATH PLACEMENT  11/28/2008   FLEISHMAN  . VIDEO BRONCHOSCOPY WITH ENDOBRONCHIAL ULTRASOUND N/A 07/24/2016   Procedure: VIDEO BRONCHOSCOPY WITH ENDOBRONCHIAL ULTRASOUND;  Surgeon: Grace Isaac, MD;  Location: [facility] OR;  Service: Thoracic;  Laterality: N/A;    Family History:  Family History  Problem Relation Age of Onset  .  Alcoholism Father   . Alcoholism Mother     Social History:  reports that he has been smoking Cigarettes.  He has a 5.00 pack-year smoking history. He has never used smokeless tobacco. He reports that he does not drink alcohol or use drugs.  Allergies:  Allergies  Allergen Reactions  . Latex Rash    Medications: Medications have been reviewed in epic  Please HPI for pertinent positives, otherwise complete 10 system ROS negative, malnutrition with g-tube in place, chronic pancreatitis.    Physical Exam: BP 116/66 (BP Location: Left Arm)   Pulse 82   Temp 98.5 F (36.9 C) (Oral)   Resp 20   Ht '5\' 6"'$  (1.676 m)   Wt 107 lb 5.8 oz (48.7 kg)   SpO2 92%   BMI 17.33 kg/m  Body mass index is 17.33 kg/m. General: chronically ill-appearing cachectic white male who is laying in bed in NAD HEENT: head is normocephalic, atraumatic.  Sclera are noninjected.  PERRL.  Ears and nose without any masses or lesions.  Mouth is pink and moist Heart: regular, rate, and rhythm.  Normal s1,s2. No obvious murmurs, gallops, or rubs noted.  Lungs: CTAB, no wheezes, rhonchi, or rales noted.  Respiratory effort nonlabored Abd: soft, tender in LUQ, ND, +BS, very skinny.  g-tube in place in epigastrium Psych: A&Ox3 with an appropriate affect.   Labs: Results for orders placed or performed during the hospital encounter of 10/20/16 (from the past 48 hour(s))  Lipase, blood     Status: None   Collection Time: 10/20/16  5:40 PM  Result Value Ref Range   Lipase 28 11 - 51 U/L  Comprehensive metabolic panel     Status: Abnormal   Collection Time: 10/20/16  5:40 PM  Result Value Ref Range   Sodium 137 135 - 145 mmol/L   Potassium 3.0 (L) 3.5 - 5.1 mmol/L   Chloride 103 101 - 111 mmol/L   CO2 26 22 - 32 mmol/L   Glucose, Bld 206 (H) 65 - 99 mg/dL   BUN 8 6 - 20 mg/dL   Creatinine, Ser 0.77 0.61 - 1.24 mg/dL   Calcium 8.3 (L) 8.9 - 10.3 mg/dL   Total Protein 6.4 (L) 6.5 - 8.1 g/dL   Albumin 3.3 (L) 3.5  - 5.0 g/dL   AST 18 15 - 41 U/L   ALT 13 (L) 17 - 63 U/L   Alkaline Phosphatase 139 (H) 38 - 126 U/L   Total Bilirubin 0.3 0.3 - 1.2 mg/dL   GFR calc non Af Amer >60 >60 mL/min   GFR calc Af Amer >60 >60 mL/min    Comment: (NOTE) The eGFR has been calculated using the CKD EPI equation. This calculation has not been validated in all clinical situations. eGFR's persistently <60 mL/min signify possible Chronic Kidney Disease.    Anion gap 8 5 - 15  CBC     Status: Abnormal   Collection Time: 10/20/16  5:40 PM  Result Value Ref Range   WBC 6.5 4.0 - 10.5 K/uL   RBC 2.68 (L) 4.22 - 5.81 MIL/uL   Hemoglobin 8.9 (L) 13.0 - 17.0 g/dL  HCT 25.7 (L) 39.0 - 52.0 %   MCV 95.9 78.0 - 100.0 fL   MCH 33.2 26.0 - 34.0 pg   MCHC 34.6 30.0 - 36.0 g/dL   RDW 95.4 24.8 - 14.4 %   Platelets 156 150 - 400 K/uL  Phenytoin level, total     Status: None   Collection Time: 10/20/16  5:40 PM  Result Value Ref Range   Phenytoin Lvl 14.9 10.0 - 20.0 ug/mL  Type and screen     Status: None   Collection Time: 10/20/16  5:40 PM  Result Value Ref Range   ABO/RH(D) O POS    Antibody Screen NEG    Sample Expiration 10/23/2016   I-Stat CG4 Lactic Acid, ED     Status: None   Collection Time: 10/20/16  5:53 PM  Result Value Ref Range   Lactic Acid, Venous 0.78 0.5 - 1.9 mmol/L  Urinalysis, Routine w reflex microscopic     Status: None   Collection Time: 10/21/16  1:46 AM  Result Value Ref Range   Color, Urine YELLOW YELLOW   APPearance CLEAR CLEAR   Specific Gravity, Urine 1.010 1.005 - 1.030   pH 6.0 5.0 - 8.0   Glucose, UA NEGATIVE NEGATIVE mg/dL   Hgb urine dipstick NEGATIVE NEGATIVE   Bilirubin Urine NEGATIVE NEGATIVE   Ketones, ur NEGATIVE NEGATIVE mg/dL   Protein, ur NEGATIVE NEGATIVE mg/dL   Nitrite NEGATIVE NEGATIVE   Leukocytes, UA NEGATIVE NEGATIVE    Comment: Microscopic not done on urines with negative protein, blood, leukocytes, nitrite, or glucose < 500 mg/dL.  MRSA PCR Screening      Status: Abnormal   Collection Time: 10/21/16  8:07 AM  Result Value Ref Range   MRSA by PCR POSITIVE (A) NEGATIVE    Comment:        The GeneXpert MRSA Assay (FDA approved for NASAL specimens only), is one component of a comprehensive MRSA colonization surveillance program. It is not intended to diagnose MRSA infection nor to guide or monitor treatment for MRSA infections. RESULT CALLED TO, READ BACK BY AND VERIFIED WITH: FLOYD,D @ 1451 ON [id number] BY POTEAT,S   Comprehensive metabolic panel     Status: Abnormal   Collection Time: 10/21/16  9:55 AM  Result Value Ref Range   Sodium 135 135 - 145 mmol/L   Potassium 3.8 3.5 - 5.1 mmol/L    Comment: DELTA CHECK NOTED REPEATED TO VERIFY NO VISIBLE HEMOLYSIS    Chloride 107 101 - 111 mmol/L   CO2 25 22 - 32 mmol/L   Glucose, Bld 81 65 - 99 mg/dL   BUN <5 (L) 6 - 20 mg/dL   Creatinine, Ser 3.92 0.61 - 1.24 mg/dL   Calcium 7.6 (L) 8.9 - 10.3 mg/dL   Total Protein 5.2 (L) 6.5 - 8.1 g/dL   Albumin 2.6 (L) 3.5 - 5.0 g/dL   AST 15 15 - 41 U/L   ALT 12 (L) 17 - 63 U/L   Alkaline Phosphatase 103 38 - 126 U/L   Total Bilirubin 0.2 (L) 0.3 - 1.2 mg/dL   GFR calc non Af Amer >60 >60 mL/min   GFR calc Af Amer >60 >60 mL/min    Comment: (NOTE) The eGFR has been calculated using the CKD EPI equation. This calculation has not been validated in all clinical situations. eGFR's persistently <60 mL/min signify possible Chronic Kidney Disease.    Anion gap 3 (L) 5 - 15  CBC     Status: Abnormal  Collection Time: 10/21/16  9:55 AM  Result Value Ref Range   WBC 5.3 4.0 - 10.5 K/uL   RBC 2.32 (L) 4.22 - 5.81 MIL/uL   Hemoglobin 7.4 (L) 13.0 - 17.0 g/dL   HCT 22.2 (L) 39.0 - 52.0 %   MCV 95.7 78.0 - 100.0 fL   MCH 31.9 26.0 - 34.0 pg   MCHC 33.3 30.0 - 36.0 g/dL   RDW 15.1 11.5 - 15.5 %   Platelets 124 (L) 150 - 400 K/uL  Hemoglobin A1c     Status: None   Collection Time: 10/21/16  9:55 AM  Result Value Ref Range   Hgb A1c MFr Bld 5.0  4.8 - 5.6 %    Comment: (NOTE) Pre diabetes:          5.7%-6.4% Diabetes:              >6.4% Glycemic control for   <7.0% adults with diabetes    Mean Plasma Glucose 96.8 mg/dL    Comment: Performed at [facility], [place] [address], [place], [place] [address]  CBC     Status: Abnormal   Collection Time: 10/22/16  4:18 AM  Result Value Ref Range   WBC 8.7 4.0 - 10.5 K/uL   RBC 2.38 (L) 4.22 - 5.81 MIL/uL   Hemoglobin 7.9 (L) 13.0 - 17.0 g/dL   HCT 23.1 (L) 39.0 - 52.0 %   MCV 97.1 78.0 - 100.0 fL   MCH 33.2 26.0 - 34.0 pg   MCHC 34.2 30.0 - 36.0 g/dL   RDW 15.0 11.5 - 15.5 %   Platelets 120 (L) 150 - 400 K/uL  Comprehensive metabolic panel     Status: Abnormal   Collection Time: 10/22/16  4:18 AM  Result Value Ref Range   Sodium 136 135 - 145 mmol/L   Potassium 3.9 3.5 - 5.1 mmol/L   Chloride 107 101 - 111 mmol/L   CO2 25 22 - 32 mmol/L   Glucose, Bld 78 65 - 99 mg/dL   BUN <5 (L) 6 - 20 mg/dL   Creatinine, Ser 0.60 (L) 0.61 - 1.24 mg/dL   Calcium 7.8 (L) 8.9 - 10.3 mg/dL   Total Protein 5.2 (L) 6.5 - 8.1 g/dL   Albumin 2.5 (L) 3.5 - 5.0 g/dL   AST 12 (L) 15 - 41 U/L   ALT 10 (L) 17 - 63 U/L   Alkaline Phosphatase 105 38 - 126 U/L   Total Bilirubin 0.5 0.3 - 1.2 mg/dL   GFR calc non Af Amer >60 >60 mL/min   GFR calc Af Amer >60 >60 mL/min    Comment: (NOTE) The eGFR has been calculated using the CKD EPI equation. This calculation has not been validated in all clinical situations. eGFR's persistently <60 mL/min signify possible Chronic Kidney Disease.    Anion gap 4 (L) 5 - 15  Magnesium     Status: Abnormal   Collection Time: 10/22/16  4:18 AM  Result Value Ref Range   Magnesium 1.2 (L) 1.7 - 2.4 mg/dL  Occult bld gastric/duodenum (cup to lab)     Status: Abnormal   Collection Time: 10/22/16 11:54 AM  Result Value Ref Range   pH, Gastric 2    Occult Blood, Gastric POSITIVE (A) NEGATIVE    Imaging: Ct Abdomen Pelvis W Contrast  Result Date:  10/21/2016 CLINICAL DATA:  Initial evaluation for acute epigastric pain, nausea, vomiting, diarrhea for 1 week. EXAM: CT ABDOMEN AND PELVIS WITH CONTRAST TECHNIQUE:  Multidetector CT imaging of the abdomen and pelvis was performed using the standard protocol following bolus administration of intravenous contrast. CONTRAST:  80 cc of Isovue-300. COMPARISON:  Prior CT from 06/16/2016. FINDINGS: Lower chest: Mild subsegmental atelectasis present within the lung bases. Visualized lungs are otherwise clear. Hepatobiliary: Scattered calcified granulomas present within liver. Prominent periportal edema. Liver otherwise unremarkable. Gallbladder within normal limits. No biliary dilatation. Pancreas: Scattered calcifications present within the pancreas, consistent with chronic pancreatitis. Pancreas itself is somewhat ill-defined, similar to previous. A superimposed acute component would be difficult to exclude, although no other significant inflammatory changes seen about the pancreas on today's exam. No very an crowded fluid collections. Spleen: Multiple granulomas noted within the spleen. Spleen otherwise unremarkable. Adrenals/Urinary Tract: Adrenal glands are normal. Kidneys equal in size with symmetric enhancement. No nephrolithiasis, hydronephrosis, or focal enhancing renal mass. subcentimeter hypodensity within left kidney too small the characterize, but statistically likely reflects a small cyst. No hydroureter. Bladder within normal limits. Stomach/Bowel: Circumferential wall thickening seen about the distal esophagus. Small hiatal hernia. Percutaneous G-tube in place within the stomach, and appears appropriately positioned. No evidence for bowel obstruction. Enteric contrast material reaches the level of the rectum. Circumferential wall thickening seen diffusely throughout the colon. While this may be related to incomplete distension, possible acute colitis could also have this appearance. No other significant  inflammatory changes about the bowels. Vascular/Lymphatic: Extensive atherosclerotic changes stenosis throughout the intra- abdominal aorta and its branch vessels. No aortic aneurysm. Secondary stenosis at the origin of the celiac access, left renal artery common likely IMA. Severe stenosis versus occlusion with distal reconstitution of the bilateral external iliac vessels similar to previous. 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 Finding likely related to chronic pancreatitis. Shotty retroperitoneal lymph nodes similar to previous. No new adenopathy. Reproductive: Mild prostatic enlargement, similar. Other: No free intraperitoneal air. Small volume free fluid within the abdomen and pelvis, new from previous. Musculoskeletal: No acute osseus abnormality. No worrisome lytic or blastic osseous lesions. IMPRESSION: 1. Diffuse wall thickening about the colon. While this finding may be related to incomplete distension, possible acute colitis could also have this appearance. 2. Sequelae of chronic pancreatitis. While this is largely chronic in appearance, a superimposed acute component would be extremely difficult to exclude. Correlation with serum lipase recommended. 3. Small volume free fluid within the abdomen and pelvis, new from prior. 4. Percutaneous G-tube in place within the stomach. 5. 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 This is likely related to chronic pancreatitis. 6. Severe atherosclerotic disease. Secondary severe stenosis and/or occlusion with distal reconstitution involving the bilateral external iliac vessels, similar to previous. 7. Circumferential wall thickening about the distal esophagus. Finding may reflect sequelae of reflux disease or possibly esophagitis. Electronically Signed   By: Jeannine Boga M.D.   On: 10/21/2016 03:08   Mr Jodene Nam Abdomen W Wo Contrast  Result  Date: 10/21/2016 CLINICAL DATA:  56 year old male with possible mesenteric ischemia EXAM: MRI ABDOMEN WITHOUT AND WITH CONTRAST TECHNIQUE: Multiplanar multisequence MR imaging of the abdomen was performed both before and after the administration of intravenous contrast. CONTRAST:  21m MULTIHANCE GADOBENATE DIMEGLUMINE 529 MG/ML IV SOLN COMPARISON:  CT 10/21/2016, 09/18/2016 FINDINGS: Lower chest: No acute findings. Hepatobiliary: Unremarkable appearance of liver parenchyma. Evidence of granulomatous disease demonstrated on prior CT. Unremarkable gallbladder. Pancreas: Pancreas not well evaluated. Known changes of chronic pancreatitis better characterized on prior CT imaging. Spleen: Multiple foci of susceptibility  throughout the spleen, compatible with changes of prior granulomatous disease. Adrenals/Urinary Tract:  Unremarkable bilateral adrenal glands. Bilateral kidneys demonstrate symmetric perfusion with no hydronephrosis. Stomach/Bowel: Unremarkable stomach and small bowel. Unremarkable visualized colon. Vascular: Aorta: No aneurysm. No dissection flap. Irregular lumen of the abdominal aorta compatible with atherosclerotic changes. Segmental vessels of the lower chest and the abdomen patent (intercostal and lumbar). Lower extremity: Irregular lumen of the proximal left common iliac artery compatible with atherosclerotic changes. Re- demonstration of left external iliac artery occlusion with patent hypogastric artery at the origin. Pelvic vessels not well evaluated. Re- demonstration of occluded right common iliac artery. Celiac artery: Celiac artery is patent with irregular and tortuous appearance of the origin compatible with atherosclerotic changes. There is perhaps 50% stenosis at the origin. The branch vessels of common hepatic artery and splenic artery are patent. Superior mesenteric artery: SMA is patent at the origin, with stenotic segment just beyond the origin of approximately 50%. Branch vessels  remain patent. Inferior mesenteric artery: Signal maintained within the IMA beyond the origin, likely with high-grade stenosis of the origin. Renal arteries: Single right renal artery. No significant stenosis of the origin the right renal artery. Caliber maintained throughout the right renal artery. There are at least 2 left renal artery, both of which appears stenotic at the origin. 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 The MRI again demonstrates engorged left gonadal vein, better characterized on prior CT. 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 There appears to be 50% stenosis of the celiac origin just beyond the SMA origin, with likely high-grade stenosis of the IMA origin. 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 These results were called by telephone at the time of interpretation on 10/21/2016 at 3:15 pm to Dr. Jani Gravel , who verbally acknowledged these results. There appears to be developing stenoses of the 2 left-sided renal arteries. No significant stenosis appreciated at the origin of the right renal artery. Perfusion on this study appears symmetric. 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 Changes of chronic pancreatitis better characterized on prior CT imaging. 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 Signed, Dulcy Fanny. Earleen Newport, DO [facility]  Radiology Electronically Signed   By: Corrie Mckusick D.O.   On: 10/21/2016 15:19    Assessment/Plan 1. Abdominal pain  The patient has abdominal pain; however, the etiology is unclear.  His MRA shows only about 50% stenosis.  His CT suggest possible colitis vs incomplete distention of his colon along with changes of chronic pancreatitis.  His MRA shows an unremarkable colon.  Given the acute onset of his symptoms, as well as the minimal stenosis, it is unlikely that the etiology of his symptoms are secondary to mesenteric ischemia.  It would be more likely this is coming from an acute illness such as gastroenteritis, colitis, etc.  The patient may need further evaluation from gastroenterology if he does not improve.  If he has a full work up with no other findings, then he may follow up in IR  clinic for further evaluation of his mild SMA stenosis.  No IR interventions warranted at this time.  Thank you for this interesting consult.  I greatly enjoyed meeting Alianza and look forward to participating in their care.  A copy of this report was sent to the requesting provider on this date.  Electronically Signed: Henreitta Cea 10/22/2016, 2:10 PM   I spent a total of 40 Minutes    in face to face in clinical consultation, greater than 50% of which was counseling/coordinating care for abdominal pain

## 2016-10-22 NOTE — Progress Notes (Addendum)
PROGRESS NOTE    Marcus Brooks  CXK:[id number] DOB: [birth year] DOA: 10/20/2016 PCP: [name], FNP   Chief Complaint  Patient presents with  . Emesis  . Nausea  . Diarrhea    Brief Narrative:  56 y.o.male,w small cell lung cancer dx 07/2016, prior colonoscopy 06/26/2016 =>int hemorrhoids , apparently c/o diarrhea for the past 1 week. And epigastric discomfort. + n/v. + hearrtburn. No bloody emesis. Denies fever, chills, constipation, brbpr. Pt presented due to abdominal discomfort Assessment & Plan   Acute hematemesis/GI bleed/Normocytic anemia -Patient with noted dark maroon color hematemesis today -Occult + -Place patient on IV Protonix 40 mg twice a day -gastroenterology consulted and appreciated -Continue clear liquid diet -Patient was recently placed on Lovenox for spinning vein thrombosis, have discontinued -hemoglobin currently 7.9 ( dropped from admission, 10.1).  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  Seizure disorder -Continue Dilantin  Essential hypertension -Continue amlodipine  Small cell lung cancer -suspected to be stage IIIA -Patient  follows with Dr. Julien Nordmann and radiation oncology  Tobacco abuse -Discussed smoking cessation  Severe malnutrition/underweight  -likely secondary to patient's comorbid illnesses -nutrition following  DVT Prophylaxis  SCDs  Code Status: Full  Family Communication: Family at bedside  Disposition Plan: Admitted, pending gastroenterology consult and recommendations  Consultants Gastroenterology  Procedures  None  Antibiotics   Anti-infectives    Start     Dose/Rate Route Frequency Ordered Stop   10/21/16 1200  metroNIDAZOLE (FLAGYL) IVPB 500 mg     500 mg 100 mL/hr over 60 Minutes Intravenous Every 8 hours 10/21/16 1015     10/21/16 1100  ciprofloxacin (CIPRO) IVPB 400 mg     400 mg 200 mL/hr over 60 Minutes Intravenous Every 12 hours 10/21/16 1015        Subjective:   Marcus Brooks seen and examined today.  Continues complain of abdominal pain, nausea, vomiting. Had very dark maroon-colored vomit this morning, saved in a cup. Denies further diarrhea. Denies current chest pain, shortness of breath, dizziness or headache.  Objective:   Vitals:   10/21/16 1618 10/21/16 2035 10/22/16 0528 10/22/16 1001  BP: 98/60 115/64 131/71 116/66  Pulse: (!) 58 60 77 82  Resp: 16 16 16 20   Temp: 97.9 F (36.6 C) 97.6 F (36.4 C) 98.5 F (36.9 C) 98.5 F (36.9 C)  TempSrc: Oral Oral Oral Oral  SpO2: 100% 94% 94% 92%  Weight:   48.7 kg (107 lb 5.8 oz)   Height:        Intake/Output Summary (Last 24 hours) at 10/22/16 1440 Last data filed at 10/22/16 1136  Gross per 24 hour  Intake           2152.5 ml  Output             1525  ml  Net            627.5 ml   Filed Weights   10/20/16 2207 10/21/16 0428 10/22/16 0528  Weight: 45.8 kg (100 lb 15.5 oz) 48.7 kg (107 lb 5.8 oz) 48.7 kg (107 lb 5.8 oz)    Exam  General: Well developed, chronically ill-appearing, cachectic  HEENT: NCAT, mucous membranes moist.   Cardiovascular: S1 S2 auscultated, no rubs, murmurs or gallops.  Regular rate and rhythm.  Respiratory: Clear to auscultation bilaterally with equal chest rise  Abdomen: Soft, Epigastric TTP, nondistended, + bowel sounds, +peg  Extremities: warm dry without cyanosis clubbing or edema  Neuro: AAOx3, nonfocal  Psych: Appropriate   Data Reviewed: I have personally reviewed following labs and imaging studies  CBC:  Recent Labs Lab 10/20/16 1124 10/20/16 1740 10/21/16 0955 10/22/16 0418  WBC 7.4 6.5 5.3 8.7  NEUTROABS 5.6  --   --   --   HGB 10.1* 8.9* 7.4* 7.9*  HCT 29.7* 25.7* 22.2* 23.1*  MCV 96.5 95.9 95.7 97.1  PLT 190 156 124* 865*   Basic Metabolic Panel:  Recent Labs Lab 10/20/16 1124 10/20/16 1740 10/21/16 0955 10/22/16 0418  NA 140 137 135 136  K 3.3* 3.0* 3.8 3.9  CL  --  103 107 107  CO2 25 26 25 25   GLUCOSE 95 206* 81 78  BUN 6.5* 8 <5* <5*  CREATININE 0.8 0.77 0.63 0.60*  CALCIUM 8.9 8.3* 7.6* 7.8*  MG 1.7  --   --  1.2*   GFR: Estimated Creatinine Clearance: 71 mL/min (A) (by C-G formula based on SCr of 0.6 mg/dL (L)). Liver Function Tests:  Recent Labs Lab 10/20/16 1124 10/20/16 1740 10/21/16 0955 10/22/16 0418  AST 16 18 15  12*  ALT 11 13* 12* 10*  ALKPHOS 150 139* 103 105  BILITOT 0.37 0.3 0.2* 0.5  PROT 6.8 6.4* 5.2* 5.2*  ALBUMIN 3.2* 3.3* 2.6* 2.5*    Recent Labs Lab 10/20/16 1740  LIPASE 28   No results for input(s): AMMONIA in the last 168 hours. Coagulation Profile: No results for input(s): INR, PROTIME in the last 168 hours. Cardiac Enzymes: No results for input(s): CKTOTAL, CKMB, CKMBINDEX, TROPONINI in the last 168 hours. BNP (last 3 results) No results for input(s): PROBNP in the last 8760 hours. HbA1C:  Recent Labs  10/21/16 0955  HGBA1C 5.0   CBG: No results for input(s): GLUCAP in the last 168 hours. Lipid Profile: No results for input(s): CHOL, HDL, LDLCALC, TRIG, CHOLHDL, LDLDIRECT in the last 72 hours. Thyroid Function Tests: No results for input(s): TSH,  T4TOTAL, FREET4, T3FREE, THYROIDAB in the last 72 hours. Anemia Panel: No results for input(s): VITAMINB12, FOLATE, FERRITIN, TIBC, IRON, RETICCTPCT in the last 72 hours. Urine analysis:    Component Value Date/Time   COLORURINE YELLOW 10/21/2016 0146   APPEARANCEUR CLEAR 10/21/2016 0146   LABSPEC 1.010 10/21/2016 0146   PHURINE 6.0 10/21/2016 0146   GLUCOSEU NEGATIVE 10/21/2016 0146   HGBUR NEGATIVE 10/21/2016 0146   BILIRUBINUR NEGATIVE 10/21/2016 0146   KETONESUR NEGATIVE 10/21/2016 0146   PROTEINUR NEGATIVE 10/21/2016 0146   UROBILINOGEN 2.0 (H) 06/02/2016 1126   NITRITE NEGATIVE 10/21/2016 0146   LEUKOCYTESUR NEGATIVE 10/21/2016 0146   Sepsis Labs: @LABRCNTIP (procalcitonin:4,lacticidven:4)  ) Recent Results (from the past 240 hour(s))  MRSA PCR Screening     Status: Abnormal   Collection Time: 10/21/16  8:07 AM  Result Value Ref Range Status   MRSA by PCR POSITIVE (A) NEGATIVE  Final    Comment:        The GeneXpert MRSA Assay (FDA approved for NASAL specimens only), is one component of a comprehensive MRSA colonization surveillance program. It is not intended to diagnose MRSA infection nor to guide or monitor treatment for MRSA infections. RESULT CALLED TO, READ BACK BY AND VERIFIED WITH: FLOYD,D @ [id number] ON [id number] BY POTEAT,S       Radiology Studies: Ct Abdomen Pelvis W Contrast  Result Date: 10/21/2016 CLINICAL DATA:  Initial evaluation for acute epigastric pain, nausea, vomiting, diarrhea for 1 week. EXAM: CT ABDOMEN AND PELVIS WITH CONTRAST TECHNIQUE: Multidetector CT imaging of the abdomen and pelvis was performed using the standard protocol following bolus administration of intravenous contrast. CONTRAST:  80 cc of Isovue-300. COMPARISON:  Prior CT from 06/16/2016. FINDINGS: Lower chest: Mild subsegmental atelectasis present within the lung bases. Visualized lungs are otherwise clear. Hepatobiliary: Scattered calcified granulomas present within liver.  Prominent periportal edema. Liver otherwise unremarkable. Gallbladder within normal limits. No biliary dilatation. Pancreas: Scattered calcifications present within the pancreas, consistent with chronic pancreatitis. Pancreas itself is somewhat ill-defined, similar to previous. A superimposed acute component would be difficult to exclude, although no other significant inflammatory changes seen about the pancreas on today's exam. No very an crowded fluid collections. Spleen: Multiple granulomas noted within the spleen. Spleen otherwise unremarkable. Adrenals/Urinary Tract: Adrenal glands are normal. Kidneys equal in size with symmetric enhancement. No nephrolithiasis, hydronephrosis, or focal enhancing renal mass. subcentimeter hypodensity within left kidney too small the characterize, but statistically likely reflects a small cyst. No hydroureter. Bladder within normal limits. Stomach/Bowel: Circumferential wall thickening seen about the distal esophagus. Small hiatal hernia. Percutaneous G-tube in place within the stomach, and appears appropriately positioned. No evidence for bowel obstruction. Enteric contrast material reaches the level of the rectum. Circumferential wall thickening seen diffusely throughout the colon. While this may be related to incomplete distension, possible acute colitis could also have this appearance. No other significant inflammatory changes about the bowels. Vascular/Lymphatic: Extensive atherosclerotic changes stenosis throughout the intra- abdominal aorta and its branch vessels. No aortic aneurysm. Secondary stenosis at the origin of the celiac access, left renal artery common likely IMA. Severe stenosis versus occlusion with distal reconstitution of the bilateral external iliac vessels similar to previous. 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 Finding likely related to chronic pancreatitis. Shotty retroperitoneal  lymph nodes similar to previous. No new adenopathy. Reproductive: Mild prostatic enlargement, similar. Other: No free intraperitoneal air. Small volume free fluid within the abdomen and pelvis, new from previous. Musculoskeletal: No acute osseus abnormality. No worrisome lytic or blastic osseous lesions. IMPRESSION: 1. Diffuse wall thickening about the colon. While this finding may be related to incomplete distension, possible acute colitis could also have this appearance. 2. Sequelae of chronic pancreatitis. While this is largely chronic in appearance, a superimposed acute component would be extremely difficult to exclude. Correlation with serum lipase recommended. 3. Small volume free fluid within the abdomen and pelvis, new from prior. 4. Percutaneous G-tube in place within the stomach. 5. 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 This is likely related to chronic pancreatitis. 6. Severe atherosclerotic disease. Secondary severe stenosis and/or occlusion with distal reconstitution involving the bilateral external iliac vessels, similar to previous. 7. Circumferential wall thickening about the distal esophagus. Finding may reflect sequelae of reflux disease or possibly esophagitis. Electronically Signed   By: Jeannine Boga M.D.   On: 10/21/2016 03:08  Mr Jodene Nam Abdomen W Wo Contrast  Result Date: 10/21/2016 CLINICAL DATA:  56 year old male with possible mesenteric ischemia EXAM: MRI ABDOMEN WITHOUT AND WITH CONTRAST TECHNIQUE: Multiplanar multisequence MR imaging of the abdomen was performed both before and after the administration of intravenous contrast. CONTRAST:  27mL MULTIHANCE GADOBENATE DIMEGLUMINE 529 MG/ML IV SOLN COMPARISON:  CT 10/21/2016, 09/18/2016 FINDINGS: Lower chest: No acute findings. Hepatobiliary: Unremarkable appearance of liver parenchyma. Evidence of granulomatous disease demonstrated on prior CT. Unremarkable gallbladder. Pancreas:  Pancreas not well evaluated. Known changes of chronic pancreatitis better characterized on prior CT imaging. Spleen: Multiple foci of susceptibility throughout the spleen, compatible with changes of prior granulomatous disease. Adrenals/Urinary Tract:  Unremarkable bilateral adrenal glands. Bilateral kidneys demonstrate symmetric perfusion with no hydronephrosis. Stomach/Bowel: Unremarkable stomach and small bowel. Unremarkable visualized colon. Vascular: Aorta: No aneurysm. No dissection flap. Irregular lumen of the abdominal aorta compatible with atherosclerotic changes. Segmental vessels of the lower chest and the abdomen patent (intercostal and lumbar). Lower extremity: Irregular lumen of the proximal left common iliac artery compatible with atherosclerotic changes. Re- demonstration of left external iliac artery occlusion with patent hypogastric artery at the origin. Pelvic vessels not well evaluated. Re- demonstration of occluded right common iliac artery. Celiac artery: Celiac artery is patent with irregular and tortuous appearance of the origin compatible with atherosclerotic changes. There is perhaps 50% stenosis at the origin. The branch vessels of common hepatic artery and splenic artery are patent. Superior mesenteric artery: SMA is patent at the origin, with stenotic segment just beyond the origin of approximately 50%. Branch vessels remain patent. Inferior mesenteric artery: Signal maintained within the IMA beyond the origin, likely with high-grade stenosis of the origin. Renal arteries: Single right renal artery. No significant stenosis of the origin the right renal artery. Caliber maintained throughout the right renal artery. There are at least 2 left renal artery, both of which appears stenotic at the origin. 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 Venous: Left renal vein  compressed by superior mesenteric artery. The MRI again demonstrates engorged left gonadal vein, better characterized on prior CT. 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 There appears to be 50% stenosis of the celiac origin just beyond the SMA origin, with likely high-grade stenosis of the IMA origin. 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 These results were called by telephone at the time of interpretation on 10/21/2016 at 3:15 pm to Dr. Jani Gravel , who verbally acknowledged these results. There appears to be developing stenoses of the 2 left-sided renal arteries. No significant stenosis appreciated at the origin of the right renal artery. Perfusion on this study appears symmetric. 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 Changes of chronic pancreatitis better characterized on prior CT imaging. 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 Signed, Dulcy Fanny. Earleen Newport, DO [facility] Electronically Signed   By: Corrie Mckusick D.O.   On: 10/21/2016 15:19     Scheduled Meds: . amLODipine  5 mg Oral Daily  . chlorhexidine  15 mL Mouth Rinse BID  . Chlorhexidine Gluconate Cloth  6 each Topical Q0600  . DULoxetine  30 mg Oral BID  . enoxaparin (LOVENOX) injection  1 mg/kg Subcutaneous Q12H  . ferrous sulfate  325 mg Oral Q breakfast  . gabapentin  300 mg Oral QID  . mouth rinse  15 mL Mouth Rinse q12n4p  . mirtazapine  15 mg Oral QPM  . mupirocin ointment  1 application Nasal BID  . pantoprazole (PROTONIX) IV  40 mg Intravenous  Q12H  . phenytoin  300 mg Oral QHS  . potassium chloride SA  20 mEq Oral BID  . sucralfate  1 g Oral TID WC & HS   Continuous Infusions: . sodium chloride 75 mL/hr at 10/21/16 2142  . ciprofloxacin 400 mg (10/22/16 1122)  . metronidazole Stopped (10/22/16 0553)     LOS: 1 day   Time Spent in minutes   45 minutes  Malic Rosten D.O. on 10/22/2016 at 2:40 PM  Between 7am to 7pm - Pager - [phone]  After 7pm go to [email or web address] - password [email or web address]  And look for the night coverage person covering for me after hours  [facility] Office  [phone]

## 2016-10-22 NOTE — Consult Note (Signed)
Subjective:   HPI  The patient is a 56 year old male with multiple medical problems including small cell lung cancer which was diagnosed in July of this year and chronic calcific pancreatitis. He has chronic upper abdominal pain presumably due to his chronic pancreatitis.  He has a Peg tube which has been present for years. He tells me he has not used it for a couple of weeks because the liquids seem to be backing up. We were asked to see him today in regards to coffee-ground emesis which he started to experience earlier today. There has been some drop in hemoglobin and hematocrit. He denied bright red blood in the emesis. He denies ever having had an ulcer. He had a colonoscopy a few months ago by Dr. Adair Laundry in [place] which did not reveal any significant findings. He had a CT scan this admission which shows diffuse wall thickening about the colon but this could be incomplete distention but possibly colitis. There is also sequela of chronic pancreatitis. The G-tube is in place. There is a probable splenic vein thrombosis. This is most likely related to chronic pancreatitis. There is severe atherosclerotic disease. There is circumferential wall thickening about the distal esophagus which could be from reflux. He was seen in consultation by interventional radiology in regards to the possibility that his abdominal pain is due to ischemia, but they did not feel this was the case. He has been on Lovenox but this was stopped today after hematemesis reported.   Review of Systems No anginal chest pains.   Past Medical History:  Diagnosis Date  . Anxiety states 08/04/2010  . Chronic airway obstruction, not elsewhere classified 08/04/2010  . Chronic pain syndrome 08/04/2010  . Chronic pancreatitis ([place]) 08/04/2010  . Coronary artery disease   . GERD (gastroesophageal reflux disease)   . Hiatal hernia    EGD 01/18/13  . Insomnia 08/04/2010  . Intellectual disability   . Lung nodule 05/20/2016  . Other  abnormal glucose 08/04/2010  . Pancreatitis chronic 08/04/2010  . Protein calorie malnutrition ([place]) 2010  . Seizure ([place]) 08/04/2010  . Severe anemia 05/20/2016  . Small cell carcinoma of upper lobe of left lung ([place]) 07/30/2016  . Splenic vein thrombosis    Chronic.  . Tobacco use disorder 08/04/2010  . Unspecified essential hypertension 08/04/2010   Past Surgical History:  Procedure Laterality Date  . COLONOSCOPY WITH PROPOFOL N/A 06/26/2016   Procedure: COLONOSCOPY WITH PROPOFOL;  Surgeon: Rogene Houston, MD;  [facility];  Service: Endoscopy;  Laterality: N/A;  10:30  . CORONARY ANGIOPLASTY WITH STENT PLACEMENT    . ESOPHAGOGASTRODUODENOSCOPY N/A 01/18/2013   Procedure: ESOPHAGOGASTRODUODENOSCOPY (EGD);  Surgeon: Rogene Houston, MD;  [facility];  Service: Endoscopy;  Laterality: N/A;  . EUS  03/26/2009   [facility]  . GASTROSTOMY-JEJEUNOSTOMY TUBE CHANGE/PLACEMENT  12/11/2008   [facility]  . I&D EXTREMITY Left 09/12/2012   Procedure: IRRIGATION AND DEBRIDEMENT LEFT FIFTH FINGER;  Surgeon: Roseanne Kaufman, MD;  Location: [facility];  Service: Orthopedics;  Laterality: Left;  . IR FLUORO GUIDE PORT INSERTION RIGHT  08/31/2016  . IR US GUIDE VASC ACCESS RIGHT  08/31/2016  . JEJUNOSTOMY FEEDING TUBE  07/13/2010  . NERVE AND TENDON REPAIR Left 09/12/2012   Procedure: NERVE AND TENDON REPAIR LEFT FIFTH FINGER;  Surgeon: Roseanne Kaufman, MD;  Location: [facility];  Service: Orthopedics;  Laterality: Left;  . OPEN REDUCTION INTERNAL FIXATION (ORIF) METACARPAL Left 09/12/2012   Procedure: OPEN REDUCTION INTERNAL FIXATION LEFT  FIFTH FINGER;  Surgeon: Roseanne Kaufman, MD;  Location: [facility];  Service: Orthopedics;  Laterality: Left;  . PANCREATIC PSEUDOCYST DRAINAGE  07/13/2010  . PORTACATH PLACEMENT  11/28/2008   [facility]  . VIDEO BRONCHOSCOPY WITH ENDOBRONCHIAL ULTRASOUND N/A 07/24/2016   Procedure: VIDEO BRONCHOSCOPY WITH ENDOBRONCHIAL ULTRASOUND;  Surgeon: Grace Isaac, MD;   Location: [facility];  Service: Thoracic;  Laterality: N/A;   Social History   Social History  . Marital status: Married    Spouse name: N/A  . Number of children: N/A  . Years of education: N/A   Occupational History  . Not on file.   Social History Main Topics  . Smoking status: Current Every Day Smoker    Packs/day: 0.25    Years: 20.00    Types: Cigarettes  . Smokeless tobacco: Never Used     Comment: 1 pack a day since age 79  . Alcohol use No     Comment: No etoh since 5-6 yrs  . Drug use: No  . Sexual activity: No   Other Topics Concern  . Not on file   Social History Narrative   Intellectual impairment, illiterate.    family history includes Alcoholism in his father and mother.  Current Facility-Administered Medications:  .  0.9 %  sodium chloride infusion, , Intravenous, Continuous, Jani Gravel, MD, Last Rate: 75 mL/hr at 10/21/16 2142 .  acetaminophen (TYLENOL) tablet 650 mg, 650 mg, Oral, Q6H PRN, 650 mg at 10/21/16 2337 **OR** acetaminophen (TYLENOL) suppository 650 mg, 650 mg, Rectal, Q6H PRN, Jani Gravel, MD .  albuterol (PROVENTIL) (2.5 MG/3ML) 0.083% nebulizer solution 2.5 mg, 2.5 mg, Inhalation, Q6H PRN, Jani Gravel, MD .  amLODipine (NORVASC) tablet 5 mg, 5 mg, Oral, Daily, Jani Gravel, MD, 5 mg at 10/22/16 1109 .  chlorhexidine (PERIDEX) 0.12 % solution 15 mL, 15 mL, Mouth Rinse, BID, Jani Gravel, MD, 15 mL at 10/22/16 1146 .  Chlorhexidine Gluconate Cloth 2 % PADS 6 each, 6 each, Topical, Q0600, Jani Gravel, MD, 6 each at 10/22/16 [phone] .  ciprofloxacin (CIPRO) IVPB 400 mg, 400 mg, Intravenous, Q12H, Jani Gravel, MD, Last Rate: 200 mL/hr at 10/22/16 1122, 400 mg at 10/22/16 1122 .  DULoxetine (CYMBALTA) DR capsule 30 mg, 30 mg, Oral, BID, Jani Gravel, MD, 30 mg at 10/22/16 1109 .  ferrous sulfate tablet 325 mg, 325 mg, Oral, Q breakfast, Jani Gravel, MD, 325 mg at 10/22/16 1109 .  gabapentin (NEURONTIN) capsule 300 mg, 300 mg, Oral, QID, Jani Gravel, MD, 300 mg at  10/22/16 1448 .  HYDROmorphone (DILAUDID) injection 1 mg, 1 mg, Intravenous, Q3H PRN, Cristal Ford, DO, 1 mg at 10/22/16 1115 .  MEDLINE mouth rinse, 15 mL, Mouth Rinse, q12n4p, Jani Gravel, MD, 15 mL at 10/22/16 1146 .  metroNIDAZOLE (FLAGYL) IVPB 500 mg, 500 mg, Intravenous, Q8H, Jani Gravel, MD, Stopped at 10/22/16 [phone] .  mirtazapine (REMERON) tablet 15 mg, 15 mg, Oral, QPM, Jani Gravel, MD, 15 mg at 10/21/16 1713 .  mupirocin ointment (BACTROBAN) 2 % 1 application, 1 application, Nasal, BID, Jani Gravel, MD, 1 application at 35/32/99 1132 .  ondansetron (ZOFRAN) injection 4 mg, 4 mg, Intravenous, Q6H PRN, Jani Gravel, MD, 4 mg at 10/20/16 2040 .  pantoprazole (PROTONIX) injection 40 mg, 40 mg, Intravenous, Q12H, Mikhail, Neola, DO, 40 mg at 10/22/16 1122 .  phenytoin (DILANTIN) ER capsule 300 mg, 300 mg, Oral, QHS, Jani Gravel, MD, 300 mg at 10/21/16 2139 .  potassium chloride SA (K-DUR,KLOR-CON) CR tablet 20  mEq, 20 mEq, Oral, BID, Jani Gravel, MD, 20 mEq at 10/22/16 1110 .  prochlorperazine (COMPAZINE) injection 5 mg, 5 mg, Intravenous, Q6H PRN, Kirby-Graham, Karsten Fells, NP, 5 mg at 10/22/16 1108 .  sodium chloride flush (NS) 0.9 % injection 10-40 mL, 10-40 mL, Intracatheter, PRN, Jani Gravel, MD, 10 mL at 10/22/16 0420 .  sucralfate (CARAFATE) 1 GM/10ML suspension 1 g, 1 g, Oral, TID WC & HS, Jani Gravel, MD, 1 g at 10/22/16 1146  Facility-Administered Medications Ordered in Other Encounters:  .  0.9 %  sodium chloride infusion, , Intravenous, Once, Curt Bears, MD Allergies  Allergen Reactions  . Latex Rash     Objective:     BP 116/66 (BP Location: Left Arm)   Pulse 82   Temp 98.5 F (36.9 C) (Oral)   Resp 20   Ht 5\' 6"  (1.676 m)   Wt 48.7 kg (107 lb 5.8 oz)   SpO2 92%   BMI 17.33 kg/m   No distress  Heart regular rhythm  Lungs clear  Abdomen: Bowel sounds present, PEG tube present, soft, tender in the epigastrium   Laboratory No components found for: D1     Assessment:     Small cell carcinoma of the lung  Chronic calcific pancreatitis  Splenic vein thrombosis  Coffee-ground emesis          I I think his abdominal pain is related to  Pancreatitis which is chronic. There could be superimposed gastritis or peptic ulcer. We will plan EGD to evaluate the upper GI tract. His Lovenox has been held today given the coffee-ground emesis earlier today.

## 2016-10-23 ENCOUNTER — Encounter: Payer: Self-pay | Admitting: Oncology

## 2016-10-23 ENCOUNTER — Encounter (HOSPITAL_COMMUNITY)
Admission: EM | Discharge status: Against Medical Advice | Payer: Self-pay | Source: Home / Self Care | Attending: Internal Medicine

## 2016-10-23 DIAGNOSIS — R197 Diarrhea, unspecified: Secondary | ICD-10-CM

## 2016-10-23 HISTORY — PX: ESOPHAGOGASTRODUODENOSCOPY: SHX5428

## 2016-10-23 SURGERY — EGD (ESOPHAGOGASTRODUODENOSCOPY)
Anesthesia: Moderate Sedation

## 2016-10-23 MED ORDER — FENTANYL CITRATE (PF) 100 MCG/2ML IJ SOLN
INTRAMUSCULAR | Status: DC | PRN
  Administered 2016-10-23 (×2): 25 ug via INTRAVENOUS

## 2016-10-23 MED ORDER — SODIUM CHLORIDE 0.9 % IV SOLN: INTRAVENOUS | Status: DC

## 2016-10-23 MED ORDER — FERROUS SULFATE 325 (65 FE) MG PO TABS: 325.0000 mg | ORAL_TABLET | Freq: Every day | ORAL | Status: DC

## 2016-10-23 MED ORDER — MIDAZOLAM HCL 5 MG/ML IJ SOLN
INTRAMUSCULAR | Status: AC
  Filled 2016-10-23: qty 2

## 2016-10-23 MED ORDER — MIDAZOLAM HCL 10 MG/2ML IJ SOLN
INTRAMUSCULAR | Status: DC | PRN
  Administered 2016-10-23 (×2): 2 mg via INTRAVENOUS

## 2016-10-23 MED ORDER — AMITRIPTYLINE HCL 25 MG PO TABS
25.0000 mg | ORAL_TABLET | Freq: Every day | ORAL | Status: DC
  Administered 2016-10-23: 25 mg via ORAL
  Filled 2016-10-23: qty 1

## 2016-10-23 MED ORDER — FENTANYL CITRATE (PF) 100 MCG/2ML IJ SOLN
INTRAMUSCULAR | Status: AC
  Filled 2016-10-23: qty 2

## 2016-10-23 NOTE — Progress Notes (Signed)
PROGRESS NOTE    Marcus Brooks  SJG:[id number] DOB: 10-28-1960 DOA: 10/20/2016 PCP: [name], FNP    Brief Narrative: 56 y.o.male,w small cell lung cancer dx 07/2016, prior colonoscopy 06/26/2016 =>int hemorrhoids , apparently c/o diarrhea for the past 1 week. And epigastric discomfort. + n/v. + hearrtburn. No bloody emesis. Denies fever, chills, constipation, brbpr. Pt presented due to abdominal discomfort.  He complains of having bloody bowel movements last night.EGD - Non-bleeding esophageal ulcers. Biopsied.                           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                            Biopsied.                           - Normal duodenal bulb, first portion of the                            duodenum and second portion of the duodenum.                           - Intact gastrostomy with a patent G-tube present                            characterized by healthy appearing mucosa Assessment & Plan:   Principal Problem:   Diarrhea Active Problems:   Splenic vein thrombosis   Anemia   Hypokalemia   Abdominal pain Acute hematemesis/GI bleed/Normocytic anemia/pyloric tumor biopsied/nonbleeding ESOPHAGEAL ulcer -Patient with noted dark maroon color hematemesis today -Occult + -Place patient on IV Protonix 40 mg twice a day -gastroenterology consulted and appreciated -Patient was recently placed on Lovenox for spinning vein thrombosis, have discontinued -hemoglobin currently 7.9 ( dropped from admission, 10.1).  -Baseline hemoglobin recently ranging from 9-11 -Continue to monitor CBC -PLAN per gi. Diarrhea, nausea, vomiting with Abdominal pain/ Acute colitis -per patient, had been ongoing for 1 week prior to admission -CT abdomen and pelvis showed diffuse wall thickening about the colon, possible acute colitis -awaiting stool studies however has not had bowel movement since admission -currently Cipro and Flagyl -Will discontinue  cholestyramine -continue antiemetics and pain control as needed -Have changed frequency of Dilaudid from q4 to q3hrs  Splenic vein thrombosis -patient was placed on Lovenox however given hematemesis, will hold for now -noted on MRI/MRA of abdomen  Pancreatic insufficiency with history of pancreatitis secondary to alcohol abuse -CT abdomen showed chronic pancreatitis.  Seizure disorder -Continue Dilantin  Essential hypertension -Continue amlodipine  Small cell lung cancer -suspected to be stage IIIA -Patient follows with Dr. Julien Nordmann and radiation oncology  Tobacco abuse -Discussed smoking cessation  Severe malnutrition/underweight  -likely secondary to patient's comorbid illnesses -nutrition following    DVT prophylaxis:SCD Code Status: FULL Family Communication NONE Disposition Plan: TBD  Consultants:   GI Procedures:   Antimicrobials: NONE   Subjective:HAD BLOODY BMS LAST NITE   Objective: Vitals:   10/23/16 0616 10/23/16 1158 10/23/16 1201 10/23/16 1256  BP: (!) 108/59 (!) 112/55  (!) 114/59  Pulse: 60 60  64  Resp: 18 14  (!) 21  Temp: 97.8 F (36.6 C)  97.8 F (36.6 C)  TempSrc: Oral  Oral   SpO2: 97% 98%  100%  Weight: 48.6 kg (107 lb 2.3 oz) 48.5 kg (107 lb)    Height:  5\' 6"  (1.676 m)      Intake/Output Summary (Last 24 hours) at 10/23/16 1306 Last data filed at 10/23/16 0200  Gross per 24 hour  Intake             1800 ml  Output              125 ml  Net             1675 ml   Filed Weights   10/22/16 0528 10/23/16 0616 10/23/16 1158  Weight: 48.7 kg (107 lb 5.8 oz) 48.6 kg (107 lb 2.3 oz) 48.5 kg (107 lb)    Examination:  General exam: Appears calm and comfortable  Respiratory system: Clear to auscultation. Respiratory effort normal. Cardiovascular system: S1 & S2 heard, RRR. No JVD, murmurs, rubs, gallops or clicks. No pedal edema. Gastrointestinal system: Abdomen is nondistended, soft and nontender. No organomegaly or  masses felt. Normal bowel sounds heard. Central nervous system: Alert and oriented. No focal neurological deficits. Extremities: Symmetric 5 x 5 power. Skin: No rashes, lesions or ulcers Psychiatry: Judgement and insight appear normal. Mood & affect appropriate.     Data Reviewed: I have personally reviewed following labs and imaging studies  CBC:  Recent Labs Lab 10/20/16 1124 10/20/16 1740 10/21/16 0955 10/22/16 0418  WBC 7.4 6.5 5.3 8.7  NEUTROABS 5.6  --   --   --   HGB 10.1* 8.9* 7.4* 7.9*  HCT 29.7* 25.7* 22.2* 23.1*  MCV 96.5 95.9 95.7 97.1  PLT 190 156 124* 664*   Basic Metabolic Panel:  Recent Labs Lab 10/20/16 1124 10/20/16 1740 10/21/16 0955 10/22/16 0418  NA 140 137 135 136  K 3.3* 3.0* 3.8 3.9  CL  --  103 107 107  CO2 25 26 25 25   GLUCOSE 95 206* 81 78  BUN 6.5* 8 <5* <5*  CREATININE 0.8 0.77 0.63 0.60*  CALCIUM 8.9 8.3* 7.6* 7.8*  MG 1.7  --   --  1.2*   GFR: Estimated Creatinine Clearance: 70.7 mL/min (A) (by C-G formula based on SCr of 0.6 mg/dL (L)). Liver Function Tests:  Recent Labs Lab 10/20/16 1124 10/20/16 1740 10/21/16 0955 10/22/16 0418  AST 16 18 15  12*  ALT 11 13* 12* 10*  ALKPHOS 150 139* 103 105  BILITOT 0.37 0.3 0.2* 0.5  PROT 6.8 6.4* 5.2* 5.2*  ALBUMIN 3.2* 3.3* 2.6* 2.5*    Recent Labs Lab 10/20/16 1740  LIPASE 28   No results for input(s): AMMONIA in the last 168 hours. Coagulation Profile: No results for input(s): INR, PROTIME in the last 168 hours. Cardiac Enzymes: No results for input(s): CKTOTAL, CKMB, CKMBINDEX, TROPONINI in the last 168 hours. BNP (last 3 results) No results for input(s): PROBNP in the last 8760 hours. HbA1C:  Recent Labs  10/21/16 0955  HGBA1C 5.0   CBG: No results for input(s): GLUCAP in the last 168 hours. Lipid Profile: No results for input(s): CHOL, HDL, LDLCALC, TRIG, CHOLHDL, LDLDIRECT in the last 72 hours. Thyroid Function Tests: No results for input(s): TSH, T4TOTAL,  FREET4, T3FREE, THYROIDAB in the last 72 hours. Anemia Panel: No results for input(s): VITAMINB12, FOLATE, FERRITIN, TIBC, IRON, RETICCTPCT in the last 72 hours. Sepsis Labs:  Recent Labs Lab 10/20/16 1753  LATICACIDVEN 0.78    Recent Results (from the past 240  hour(s))  MRSA PCR Screening     Status: Abnormal   Collection Time: 10/21/16  8:07 AM  Result Value Ref Range Status   MRSA by PCR POSITIVE (A) NEGATIVE Final    Comment:        The GeneXpert MRSA Assay (FDA approved for NASAL specimens only), is one component of a comprehensive MRSA colonization surveillance program. It is not intended to diagnose MRSA infection nor to guide or monitor treatment for MRSA infections. RESULT CALLED TO, READ BACK BY AND VERIFIED WITH: FLOYD,D @ 3267 ON [id number] BY POTEAT,S          Radiology Studies: Mr Jodene Nam Abdomen W Wo Contrast  Result Date: 10/21/2016 CLINICAL DATA:  56 year old male with possible mesenteric ischemia EXAM: MRI ABDOMEN WITHOUT AND WITH CONTRAST TECHNIQUE: Multiplanar multisequence MR imaging of the abdomen was performed both before and after the administration of intravenous contrast. CONTRAST:  26mL MULTIHANCE GADOBENATE DIMEGLUMINE 529 MG/ML IV SOLN COMPARISON:  CT 10/21/2016, 09/18/2016 FINDINGS: Lower chest: No acute findings. Hepatobiliary: Unremarkable appearance of liver parenchyma. Evidence of granulomatous disease demonstrated on prior CT. Unremarkable gallbladder. Pancreas: Pancreas not well evaluated. Known changes of chronic pancreatitis better characterized on prior CT imaging. Spleen: Multiple foci of susceptibility throughout the spleen, compatible with changes of prior granulomatous disease. Adrenals/Urinary Tract:  Unremarkable bilateral adrenal glands. Bilateral kidneys demonstrate symmetric perfusion with no hydronephrosis. Stomach/Bowel: Unremarkable stomach and small bowel. Unremarkable visualized colon. Vascular: Aorta: No aneurysm. No dissection flap.  Irregular lumen of the abdominal aorta compatible with atherosclerotic changes. Segmental vessels of the lower chest and the abdomen patent (intercostal and lumbar). Lower extremity: Irregular lumen of the proximal left common iliac artery compatible with atherosclerotic changes. Re- demonstration of left external iliac artery occlusion with patent hypogastric artery at the origin. Pelvic vessels not well evaluated. Re- demonstration of occluded right common iliac artery. Celiac artery: Celiac artery is patent with irregular and tortuous appearance of the origin compatible with atherosclerotic changes. There is perhaps 50% stenosis at the origin. The branch vessels of common hepatic artery and splenic artery are patent. Superior mesenteric artery: SMA is patent at the origin, with stenotic segment just beyond the origin of approximately 50%. Branch vessels remain patent. Inferior mesenteric artery: Signal maintained within the IMA beyond the origin, likely with high-grade stenosis of the origin. Renal arteries: Single right renal artery. No significant stenosis of the origin the right renal artery. Caliber maintained throughout the right renal artery. There are at least 2 left renal artery, both of which appears stenotic at the origin. 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 The MRI again demonstrates engorged left gonadal vein, better characterized on prior CT. 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 There appears to be 50% stenosis of the celiac origin just beyond the SMA origin, with likely high-grade stenosis of the IMA origin. Bilateral iliac disease with re- demonstration of  right common iliac artery occlusion and left external iliac artery occlusion. These results were called by telephone at the time of interpretation on 10/21/2016 at 3:15 pm to Dr. Jani Gravel , who verbally acknowledged these results. There appears to be developing stenoses of the 2 left-sided renal arteries. No significant stenosis appreciated at the origin of the right renal  artery. Perfusion on this study appears symmetric. 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 Changes of chronic pancreatitis better characterized on prior CT imaging. 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 Signed, Dulcy Fanny. Earleen Newport, DO [facility] Electronically Signed   By: Corrie Mckusick D.O.   On: 10/21/2016 15:19        Scheduled Meds: . [MAR Hold] amLODipine  5 mg Oral Daily  . [MAR Hold] chlorhexidine  15 mL Mouth Rinse BID  . [MAR Hold] Chlorhexidine Gluconate Cloth  6 each Topical Q0600  . [MAR Hold] DULoxetine  30 mg Oral BID  . [MAR Hold] ferrous sulfate  325 mg Oral Q breakfast  . [MAR Hold] gabapentin  300 mg Oral QID  . [MAR Hold] mouth rinse  15 mL Mouth Rinse q12n4p  . [MAR Hold] mirtazapine  15 mg Oral QPM  . [MAR Hold] mupirocin ointment  1 application Nasal BID  . [MAR Hold] pantoprazole (PROTONIX) IV  40 mg Intravenous Q12H  . [MAR Hold] phenytoin  300 mg Oral QHS  . [MAR Hold] potassium chloride SA  20 mEq Oral BID  . [MAR Hold] sucralfate  1 g Oral TID WC & HS   Continuous Infusions: . sodium chloride 75 mL/hr at 10/22/16 1818  . sodium chloride    . [MAR Hold] ciprofloxacin Stopped (10/22/16 2340)  . [MAR Hold] metronidazole Stopped (10/23/16 0540)     LOS: 2 days     Georgette Shell, MD [facility]  If 7PM-7AM, please contact  night-coverage [email or web address] Password [email or web address] 10/23/2016, 1:06 PM

## 2016-10-23 NOTE — H&P (View-Only) (Signed)
Subjective:   HPI  The patient is a 56 year old male with multiple medical problems including small cell lung cancer which was diagnosed in July of this year and chronic calcific pancreatitis. He has chronic upper abdominal pain presumably due to his chronic pancreatitis.  He has a Peg tube which has been present for years. He tells me he has not used it for a couple of weeks because the liquids seem to be backing up. We were asked to see him today in regards to coffee-ground emesis which he started to experience earlier today. There has been some drop in hemoglobin and hematocrit. He denied bright red blood in the emesis. He denies ever having had an ulcer. He had a colonoscopy a few months ago by Dr. Adair Laundry in [place] which did not reveal any significant findings. He had a CT scan this admission which shows diffuse wall thickening about the colon but this could be incomplete distention but possibly colitis. There is also sequela of chronic pancreatitis. The G-tube is in place. There is a probable splenic vein thrombosis. This is most likely related to chronic pancreatitis. There is severe atherosclerotic disease. There is circumferential wall thickening about the distal esophagus which could be from reflux. He was seen in consultation by interventional radiology in regards to the possibility that his abdominal pain is due to ischemia, but they did not feel this was the case. He has been on Lovenox but this was stopped today after hematemesis reported.   Review of Systems No anginal chest pains.   Past Medical History:  Diagnosis Date  . Anxiety states 08/04/2010  . Chronic airway obstruction, not elsewhere classified 08/04/2010  . Chronic pain syndrome 08/04/2010  . Chronic pancreatitis ([place]) 08/04/2010  . Coronary artery disease   . GERD (gastroesophageal reflux disease)   . Hiatal hernia    EGD 01/18/13  . Insomnia 08/04/2010  . Intellectual disability   . Lung nodule 05/20/2016  . Other  abnormal glucose 08/04/2010  . Pancreatitis chronic 08/04/2010  . Protein calorie malnutrition ([place]) 2010  . Seizure ([place]) 08/04/2010  . Severe anemia 05/20/2016  . Small cell carcinoma of upper lobe of left lung ([place]) 07/30/2016  . Splenic vein thrombosis    Chronic.  . Tobacco use disorder 08/04/2010  . Unspecified essential hypertension 08/04/2010   Past Surgical History:  Procedure Laterality Date  . COLONOSCOPY WITH PROPOFOL N/A 06/26/2016   Procedure: COLONOSCOPY WITH PROPOFOL;  Surgeon: Rogene Houston, MD;  [facility];  Service: Endoscopy;  Laterality: N/A;  10:30  . CORONARY ANGIOPLASTY WITH STENT PLACEMENT    . ESOPHAGOGASTRODUODENOSCOPY N/A 01/18/2013   Procedure: ESOPHAGOGASTRODUODENOSCOPY (EGD);  Surgeon: Rogene Houston, MD;  [facility];  Service: Endoscopy;  Laterality: N/A;  . EUS  03/26/2009   [facility]  . GASTROSTOMY-JEJEUNOSTOMY TUBE CHANGE/PLACEMENT  12/11/2008   [facility]  . I&D EXTREMITY Left 09/12/2012   Procedure: IRRIGATION AND DEBRIDEMENT LEFT FIFTH FINGER;  Surgeon: Roseanne Kaufman, MD;  Location: [place];  Service: Orthopedics;  Laterality: Left;  . IR FLUORO GUIDE PORT INSERTION RIGHT  08/31/2016  . IR US GUIDE VASC ACCESS RIGHT  08/31/2016  . JEJUNOSTOMY FEEDING TUBE  07/13/2010  . NERVE AND TENDON REPAIR Left 09/12/2012   Procedure: NERVE AND TENDON REPAIR LEFT FIFTH FINGER;  Surgeon: Roseanne Kaufman, MD;  Location: [place];  Service: Orthopedics;  Laterality: Left;  . OPEN REDUCTION INTERNAL FIXATION (ORIF) METACARPAL Left 09/12/2012   Procedure: OPEN REDUCTION INTERNAL FIXATION LEFT  FIFTH FINGER;  Surgeon: Roseanne Kaufman, MD;  Location: [place];  Service: Orthopedics;  Laterality: Left;  . PANCREATIC PSEUDOCYST DRAINAGE  07/13/2010  . PORTACATH PLACEMENT  11/28/2008   [facility]  . VIDEO BRONCHOSCOPY WITH ENDOBRONCHIAL ULTRASOUND N/A 07/24/2016   Procedure: VIDEO BRONCHOSCOPY WITH ENDOBRONCHIAL ULTRASOUND;  Surgeon: Grace Isaac, MD;   Location: [place];  Service: Thoracic;  Laterality: N/A;   Social History   Social History  . Marital status: Married    Spouse name: N/A  . Number of children: N/A  . Years of education: N/A   Occupational History  . Not on file.   Social History Main Topics  . Smoking status: Current Every Day Smoker    Packs/day: 0.25    Years: 20.00    Types: Cigarettes  . Smokeless tobacco: Never Used     Comment: 1 pack a day since age 29  . Alcohol use No     Comment: No etoh since 5-6 yrs  . Drug use: No  . Sexual activity: No   Other Topics Concern  . Not on file   Social History Narrative   Intellectual impairment, illiterate.    family history includes Alcoholism in his father and mother.  Current Facility-Administered Medications:  .  0.9 %  sodium chloride infusion, , Intravenous, Continuous, Jani Gravel, MD, Last Rate: 75 mL/hr at 10/21/16 2142 .  acetaminophen (TYLENOL) tablet 650 mg, 650 mg, Oral, Q6H PRN, 650 mg at 10/21/16 2337 **OR** acetaminophen (TYLENOL) suppository 650 mg, 650 mg, Rectal, Q6H PRN, Jani Gravel, MD .  albuterol (PROVENTIL) (2.5 MG/3ML) 0.083% nebulizer solution 2.5 mg, 2.5 mg, Inhalation, Q6H PRN, Jani Gravel, MD .  amLODipine (NORVASC) tablet 5 mg, 5 mg, Oral, Daily, Jani Gravel, MD, 5 mg at 10/22/16 1109 .  chlorhexidine (PERIDEX) 0.12 % solution 15 mL, 15 mL, Mouth Rinse, BID, Jani Gravel, MD, 15 mL at 10/22/16 1146 .  Chlorhexidine Gluconate Cloth 2 % PADS 6 each, 6 each, Topical, Q0600, Jani Gravel, MD, 6 each at 10/22/16 [phone] .  ciprofloxacin (CIPRO) IVPB 400 mg, 400 mg, Intravenous, Q12H, Jani Gravel, MD, Last Rate: 200 mL/hr at 10/22/16 1122, 400 mg at 10/22/16 1122 .  DULoxetine (CYMBALTA) DR capsule 30 mg, 30 mg, Oral, BID, Jani Gravel, MD, 30 mg at 10/22/16 1109 .  ferrous sulfate tablet 325 mg, 325 mg, Oral, Q breakfast, Jani Gravel, MD, 325 mg at 10/22/16 1109 .  gabapentin (NEURONTIN) capsule 300 mg, 300 mg, Oral, QID, Jani Gravel, MD, 300 mg at  10/22/16 1448 .  HYDROmorphone (DILAUDID) injection 1 mg, 1 mg, Intravenous, Q3H PRN, Cristal Ford, DO, 1 mg at 10/22/16 1115 .  MEDLINE mouth rinse, 15 mL, Mouth Rinse, q12n4p, Jani Gravel, MD, 15 mL at 10/22/16 1146 .  metroNIDAZOLE (FLAGYL) IVPB 500 mg, 500 mg, Intravenous, Q8H, Jani Gravel, MD, Stopped at 10/22/16 [phone] .  mirtazapine (REMERON) tablet 15 mg, 15 mg, Oral, QPM, Jani Gravel, MD, 15 mg at 10/21/16 1713 .  mupirocin ointment (BACTROBAN) 2 % 1 application, 1 application, Nasal, BID, Jani Gravel, MD, 1 application at 34/19/62 1132 .  ondansetron (ZOFRAN) injection 4 mg, 4 mg, Intravenous, Q6H PRN, Jani Gravel, MD, 4 mg at 10/20/16 2040 .  pantoprazole (PROTONIX) injection 40 mg, 40 mg, Intravenous, Q12H, Mikhail, [place], DO, 40 mg at 10/22/16 1122 .  phenytoin (DILANTIN) ER capsule 300 mg, 300 mg, Oral, QHS, Jani Gravel, MD, 300 mg at 10/21/16 2139 .  potassium chloride SA (K-DUR,KLOR-CON) CR tablet 20  mEq, 20 mEq, Oral, BID, Jani Gravel, MD, 20 mEq at 10/22/16 1110 .  prochlorperazine (COMPAZINE) injection 5 mg, 5 mg, Intravenous, Q6H PRN, [place], Karsten Fells, NP, 5 mg at 10/22/16 1108 .  sodium chloride flush (NS) 0.9 % injection 10-40 mL, 10-40 mL, Intracatheter, PRN, Jani Gravel, MD, 10 mL at 10/22/16 0420 .  sucralfate (CARAFATE) 1 GM/10ML suspension 1 g, 1 g, Oral, TID WC & HS, Jani Gravel, MD, 1 g at 10/22/16 1146  Facility-Administered Medications Ordered in Other Encounters:  .  0.9 %  sodium chloride infusion, , Intravenous, Once, Curt Bears, MD Allergies  Allergen Reactions  . Latex Rash     Objective:     BP 116/66 (BP Location: Left Arm)   Pulse 82   Temp 98.5 F (36.9 C) (Oral)   Resp 20   Ht 5\' 6"  (1.676 m)   Wt 48.7 kg (107 lb 5.8 oz)   SpO2 92%   BMI 17.33 kg/m   No distress  Heart regular rhythm  Lungs clear  Abdomen: Bowel sounds present, PEG tube present, soft, tender in the epigastrium   Laboratory No components found for: D1     Assessment:     Small cell carcinoma of the lung  Chronic calcific pancreatitis  Splenic vein thrombosis  Coffee-ground emesis          I I think his abdominal pain is related to  Pancreatitis which is chronic. There could be superimposed gastritis or peptic ulcer. We will plan EGD to evaluate the upper GI tract. His Lovenox has been held today given the coffee-ground emesis earlier today.

## 2016-10-23 NOTE — Interval H&P Note (Signed)
History and Physical Interval Note: 56/male with coffee ground emesis. 10/23/2016 12:56 PM  Marcus Brooks  has presented today for EGD, with the diagnosis of coffee fround emesis  The various methods of treatment have been discussed with the patient and family. After consideration of risks, benefits and other options for treatment, the patient has consented to  Procedure(s): ESOPHAGOGASTRODUODENOSCOPY (EGD) (N/A) as a surgical intervention .  The patient's history has been reviewed, patient examined, no change in status, stable for surgery.  I have reviewed the patient's chart and labs.  Questions were answered to the patient's satisfaction.     Ronnette Juniper

## 2016-10-23 NOTE — Brief Op Note (Signed)
10/20/2016 - 10/23/2016  1:19 PM  PATIENT:  Marcus Brooks  56 y.o. male  PRE-OPERATIVE DIAGNOSIS:  coffee fround emesis  POST-OPERATIVE DIAGNOSIS:  Pyloric  channel mass, esophageal ulcer.  PROCEDURE:  Procedure(s): ESOPHAGOGASTRODUODENOSCOPY (EGD) (N/A)  SURGEON:  Surgeon(s) and Role:    Ronnette Juniper, MD - Primary  PHYSICIAN ASSISTANT:   ASSISTANTS: none   ANESTHESIA:   Versed 4 mg, Fentanyl 50 mcg  EBL:  No intake/output data recorded.  BLOOD ADMINISTERED:none  DRAINS: none   LOCAL MEDICATIONS USED:  NONE  SPECIMEN:  Biopsy / Limited Resection  DISPOSITION OF SPECIMEN:  PATHOLOGY  COUNTS:  YES  TOURNIQUET:  * No tourniquets in log *  DICTATION: .Dragon Dictation  PLAN OF CARE: Admit to inpatient   PATIENT DISPOSITION:  PACU - hemodynamically stable.   Delay start of Pharmacological VTE agent (>24hrs) due to surgical blood loss or risk of bleeding: no

## 2016-10-23 NOTE — Op Note (Signed)
[facility] Patient Name: Marcus Brooks Procedure Date: 10/23/2016 MRN: [id number] Attending MD: Ronnette Juniper , MD Date of Birth: Feb 22, 1960 CSN: [id number] Age: 56 Admit Type: Inpatient Procedure:                Upper GI endoscopy Indications:              Coffee-ground emesis Providers:                Ronnette Juniper, MD, Laverta Baltimore RN, RN, Tinnie Gens, Technician Referring MD:              Medicines:                Midazolam 4 mg IV, Fentanyl 50 micrograms IV Complications:            No immediate complications. Estimated Blood Loss:     Estimated blood loss: none. Procedure:                Pre-Anesthesia Assessment:                           - Prior to the procedure, a History and Physical                            was performed, and patient medications and                            allergies were reviewed. The patient's tolerance of                            previous anesthesia was also reviewed. The risks                            and benefits of the procedure and the sedation                            options and risks were discussed with the patient.                            All questions were answered, and informed consent                            was obtained. Prior Anticoagulants: The patient has                            taken no previous anticoagulant or antiplatelet                            agents. ASA Grade Assessment: III - A patient with                            severe systemic disease. After reviewing the risks  and benefits, the patient was deemed in                            satisfactory condition to undergo the procedure.                           After obtaining informed consent, the endoscope was                            passed under direct vision. Throughout the                            procedure, the patient's blood pressure, pulse, and                            oxygen  saturations were monitored continuously. The                            Endoscope was introduced through the mouth, and                            advanced to the second part of duodenum. The upper                            GI endoscopy was accomplished without difficulty.                            The patient tolerated the procedure well. Scope In: Scope Out: Findings:      Many superficial esophageal ulcers with no bleeding and no stigmata of       recent bleeding were found 22 to 25 cm from the incisors. Biopsies were       taken with a cold forceps for histology.      A medium-sized, frond-like/villous and sessile, partially       circumferential mass with no bleeding and no stigmata of recent bleeding       was found at the pylorus. Biopsies were taken with a cold forceps for       histology.      The duodenal bulb, first portion of the duodenum and second portion of       the duodenum were normal.      The cardia and gastric fundus were normal on retroflexion.      There was evidence of an intact gastrostomy with a patent G-tube present       in the gastric body. This was characterized by healthy appearing mucosa. Impression:               - Non-bleeding esophageal ulcers. Biopsied.                           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                            Biopsied.                           -  Normal duodenal bulb, first portion of the                            duodenum and second portion of the duodenum.                           - Intact gastrostomy with a patent G-tube present                            characterized by healthy appearing mucosa. Moderate Sedation:      Moderate (conscious) sedation was administered by the endoscopy nurse       and supervised by the endoscopist. The following parameters were       monitored: oxygen saturation, heart rate, blood pressure, and response       to care. Recommendation:           - Resume previous  diet.                           - Continue present medications.                           - Await pathology results.                           - Perform a CT scan (computed tomography) of                            abdomen with contrast and pelvis with contrast at                            appointment to be scheduled.                           - Return patient to hospital ward for ongoing care. Procedure Code(s):        --- Professional ---                           [phone], Esophagogastroduodenoscopy, flexible,                            transoral; with biopsy, single or multiple Diagnosis Code(s):        --- Professional ---                           K22.10, Ulcer of esophagus without bleeding                           D49.0, Neoplasm of unspecified behavior of                            digestive system                           Z93.1, Gastrostomy status  K92.0, Hematemesis CPT copyright 2016 [employer]. All rights reserved. The codes documented in this report are preliminary and upon coder review may  be revised to meet current compliance requirements. Ronnette Juniper, MD 10/23/2016 1:17:31 PM This report has been signed electronically. Number of Addenda: 0
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
## 2016-10-24 ENCOUNTER — Encounter (HOSPITAL_COMMUNITY): Payer: Self-pay | Admitting: Emergency Medicine

## 2016-10-24 DIAGNOSIS — R197 Diarrhea, unspecified: Secondary | ICD-10-CM | POA: Insufficient documentation

## 2016-10-24 DIAGNOSIS — K3189 Other diseases of stomach and duodenum: Secondary | ICD-10-CM | POA: Insufficient documentation

## 2016-10-24 DIAGNOSIS — Z85118 Personal history of other malignant neoplasm of bronchus and lung: Secondary | ICD-10-CM | POA: Insufficient documentation

## 2016-10-24 DIAGNOSIS — F17210 Nicotine dependence, cigarettes, uncomplicated: Secondary | ICD-10-CM | POA: Insufficient documentation

## 2016-10-24 DIAGNOSIS — R1013 Epigastric pain: Secondary | ICD-10-CM | POA: Insufficient documentation

## 2016-10-24 DIAGNOSIS — R112 Nausea with vomiting, unspecified: Secondary | ICD-10-CM | POA: Insufficient documentation

## 2016-10-24 DIAGNOSIS — Z931 Gastrostomy status: Secondary | ICD-10-CM | POA: Insufficient documentation

## 2016-10-24 DIAGNOSIS — I10 Essential (primary) hypertension: Secondary | ICD-10-CM | POA: Insufficient documentation

## 2016-10-24 DIAGNOSIS — Z9221 Personal history of antineoplastic chemotherapy: Secondary | ICD-10-CM | POA: Insufficient documentation

## 2016-10-24 DIAGNOSIS — Z955 Presence of coronary angioplasty implant and graft: Secondary | ICD-10-CM | POA: Insufficient documentation

## 2016-10-24 DIAGNOSIS — I2510 Atherosclerotic heart disease of native coronary artery without angina pectoris: Secondary | ICD-10-CM | POA: Insufficient documentation

## 2016-10-24 DIAGNOSIS — Z8673 Personal history of transient ischemic attack (TIA), and cerebral infarction without residual deficits: Secondary | ICD-10-CM | POA: Insufficient documentation

## 2016-10-24 DIAGNOSIS — Z79899 Other long term (current) drug therapy: Secondary | ICD-10-CM | POA: Insufficient documentation

## 2016-10-24 LAB — BASIC METABOLIC PANEL
Anion gap: 5 (ref 5–15)
BUN: 5 mg/dL — ABNORMAL LOW (ref 6–20)
CHLORIDE: 107 mmol/L (ref 101–111)
CO2: 27 mmol/L (ref 22–32)
CREATININE: 0.56 mg/dL — AB (ref 0.61–1.24)
Calcium: 7.8 mg/dL — ABNORMAL LOW (ref 8.9–10.3)
GFR calc non Af Amer: >60 mL/min (ref >60)
Glucose, Bld: 113 mg/dL — ABNORMAL HIGH (ref 65–99)
Potassium: 3.4 mmol/L — ABNORMAL LOW (ref 3.5–5.1)
Sodium: 139 mmol/L (ref 135–145)

## 2016-10-24 LAB — CBC
HEMATOCRIT: 22 % — AB (ref 39.0–52.0)
HEMOGLOBIN: 7.5 g/dL — AB (ref 13.0–17.0)
MCH: 32.8 pg (ref 26.0–34.0)
MCHC: 34.1 g/dL (ref 30.0–36.0)
MCV: 96.1 fL (ref 78.0–100.0)
Platelets: 94 10*3/uL — ABNORMAL LOW (ref 150–400)
RBC: 2.29 MIL/uL — ABNORMAL LOW (ref 4.22–5.81)
RDW: 14.4 % (ref 11.5–15.5)
WBC: 8 10*3/uL (ref 4.0–10.5)

## 2016-10-24 MED ORDER — METRONIDAZOLE 500 MG PO TABS: 500.0000 mg | ORAL_TABLET | Freq: Three times a day (TID) | ORAL | 0 refills | Status: DC

## 2016-10-24 MED ORDER — CIPROFLOXACIN HCL 500 MG PO TABS: 500.0000 mg | ORAL_TABLET | Freq: Two times a day (BID) | ORAL | 0 refills | Status: DC

## 2016-10-24 MED ORDER — HEPARIN SOD (PORK) LOCK FLUSH 100 UNIT/ML IV SOLN
500.0000 [IU] | INTRAVENOUS | Status: AC | PRN
  Administered 2016-10-24: 500 [IU]

## 2016-10-24 MED ORDER — OXYCODONE HCL 10 MG PO TABS: 10.0000 mg | ORAL_TABLET | Freq: Four times a day (QID) | ORAL | 0 refills | Status: DC | PRN

## 2016-10-24 MED ORDER — PROMETHAZINE HCL 25 MG PO TABS: 25.0000 mg | ORAL_TABLET | Freq: Four times a day (QID) | ORAL | 0 refills | Status: AC | PRN

## 2016-10-24 NOTE — Progress Notes (Signed)
[facility] PROGRESS NOTE Subjective Pt ate breakfast without vomiting still with abd pain. Path pending  Objective: Vital signs in last 24 hours: Temp:  [97.5 F (36.4 C)-98.1 F (36.7 C)] 97.8 F (36.6 C) (10/13 0424) Pulse Rate:  [57-84] 66 (10/13 0424) Resp:  [8-21] 16 (10/13 0424) BP: (94-167)/(46-65) 106/65 (10/13 0424) SpO2:  [94 %-100 %] 96 % (10/13 0424) Weight:  [46.9 kg (103 lb 6.3 oz)-48.5 kg (107 lb)] 46.9 kg (103 lb 6.3 oz) (10/13 0424) Last BM Date: 10/21/16  Intake/Output from previous day: 10/12 0701 - 10/13 0700 In: -  Out: 475 [Urine:475] Intake/Output this shift: No intake/output data recorded.  PE: General--thin WM NAD  Abdomen--nondistended but tender  Lab Results:  Recent Labs  10/21/16 0955 10/22/16 0418 10/24/16 0408  WBC 5.3 8.7 8.0  HGB 7.4* 7.9* 7.5*  HCT 22.2* 23.1* 22.0*  PLT 124* 120* 94*   BMET  Recent Labs  10/21/16 0955 10/22/16 0418 10/24/16 0408  NA 135 136 139  K 3.8 3.9 3.4*  CL 107 107 107  CO2 25 25 27   CREATININE 0.63 0.60* 0.56*   LFT  Recent Labs  10/21/16 0955 10/22/16 0418  PROT 5.2* 5.2*  AST 15 12*  ALT 12* 10*  ALKPHOS 103 105  BILITOT 0.2* 0.5   PT/INR No results for input(s): LABPROT, INR in the last 72 hours. PANCREAS No results for input(s): LIPASE in the last 72 hours.       Studies/Results: No results found.  Medications: I have reviewed the patient's current medications.  Assessment:   1. 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 Chronic Pancreatitis   Plan: Will follow, check on path results.   Amilia Vandenbrink JR,Chalese Peach L 10/24/2016, 9:07 AM  This note was created using voice recognition software. Minor errors may Have occurred unintentionally.  Pager: [phone] If no answer or after hours call [phone]

## 2016-10-24 NOTE — ED Triage Notes (Signed)
Patient reports that he was admitted here and had to sign himself AMA to go home to take care of his animals. Patient c/o abd pain, back pain and headache that been ongoing for over 3 days.

## 2016-10-24 NOTE — Progress Notes (Signed)
Pt leaving against medical advice, AMA form signed and in chart. MD notified. IV team called for port deaccess.

## 2016-10-25 ENCOUNTER — Emergency Department (HOSPITAL_COMMUNITY)
Admission: EM | Admit: 2016-10-25 | Discharge: 2016-10-25 | Disposition: A | Discharge status: Home / Self Care | Payer: Medicaid Other | Attending: Emergency Medicine | Admitting: Emergency Medicine

## 2016-10-25 ENCOUNTER — Encounter (HOSPITAL_COMMUNITY): Payer: Self-pay | Admitting: Gastroenterology

## 2016-10-25 DIAGNOSIS — R1013 Epigastric pain: Secondary | ICD-10-CM

## 2016-10-25 DIAGNOSIS — R112 Nausea with vomiting, unspecified: Secondary | ICD-10-CM

## 2016-10-25 LAB — CBC WITH DIFFERENTIAL/PLATELET
BASOS ABS: 0 10*3/uL (ref 0.0–0.1)
Basophils Relative: 0 %
EOS PCT: 1 %
Eosinophils Absolute: 0.1 10*3/uL (ref 0.0–0.7)
HCT: 23.1 % — ABNORMAL LOW (ref 39.0–52.0)
Hemoglobin: 8 g/dL — ABNORMAL LOW (ref 13.0–17.0)
LYMPHS ABS: 0.9 10*3/uL (ref 0.7–4.0)
LYMPHS PCT: 13 %
MCH: 32.9 pg (ref 26.0–34.0)
MCHC: 34.6 g/dL (ref 30.0–36.0)
MCV: 95.1 fL (ref 78.0–100.0)
MONO ABS: 0.6 10*3/uL (ref 0.1–1.0)
MONOS PCT: 8 %
Neutro Abs: 5.6 10*3/uL (ref 1.7–7.7)
Neutrophils Relative %: 78 %
PLATELETS: 92 10*3/uL — AB (ref 150–400)
RBC: 2.43 MIL/uL — ABNORMAL LOW (ref 4.22–5.81)
RDW: 14.4 % (ref 11.5–15.5)
WBC: 7.2 10*3/uL (ref 4.0–10.5)

## 2016-10-25 LAB — COMPREHENSIVE METABOLIC PANEL
ALT: 9 U/L — ABNORMAL LOW (ref 17–63)
ANION GAP: 5 (ref 5–15)
AST: 11 U/L — ABNORMAL LOW (ref 15–41)
Albumin: 2.8 g/dL — ABNORMAL LOW (ref 3.5–5.0)
Alkaline Phosphatase: 104 U/L (ref 38–126)
BILIRUBIN TOTAL: 0.2 mg/dL — AB (ref 0.3–1.2)
BUN: <5 mg/dL — ABNORMAL LOW (ref 6–20)
CHLORIDE: 103 mmol/L (ref 101–111)
CO2: 29 mmol/L (ref 22–32)
Calcium: 8 mg/dL — ABNORMAL LOW (ref 8.9–10.3)
Creatinine, Ser: 0.55 mg/dL — ABNORMAL LOW (ref 0.61–1.24)
Glucose, Bld: 88 mg/dL (ref 65–99)
POTASSIUM: 3.2 mmol/L — AB (ref 3.5–5.1)
Sodium: 137 mmol/L (ref 135–145)
TOTAL PROTEIN: 5.5 g/dL — AB (ref 6.5–8.1)

## 2016-10-25 MED ORDER — SODIUM CHLORIDE 0.9 % IV BOLUS (SEPSIS)
1000.0000 mL | Freq: Once | INTRAVENOUS | Status: AC
  Administered 2016-10-25: 1000 mL via INTRAVENOUS

## 2016-10-25 MED ORDER — HEPARIN SOD (PORK) LOCK FLUSH 100 UNIT/ML IV SOLN
500.0000 [IU] | Freq: Once | INTRAVENOUS | Status: AC
  Administered 2016-10-25: 500 [IU]
  Filled 2016-10-25: qty 5

## 2016-10-25 MED ORDER — METOCLOPRAMIDE HCL 5 MG/ML IJ SOLN
10.0000 mg | Freq: Once | INTRAMUSCULAR | Status: AC
  Administered 2016-10-25: 10 mg via INTRAVENOUS
  Filled 2016-10-25: qty 2

## 2016-10-25 MED ORDER — METOCLOPRAMIDE HCL 10 MG PO TABS: 10.0000 mg | ORAL_TABLET | Freq: Four times a day (QID) | ORAL | 0 refills | Status: AC

## 2016-10-25 NOTE — ED Provider Notes (Signed)
[facility] DEPT Provider Note   CSN: [id number] Arrival date & time: 10/24/16  1705     History   Chief Complaint Chief Complaint  Patient presents with  . Abdominal Pain  . Back Pain    HPI Marcus Brooks is a 56 y.o. male.  Patient presents with complaint of abdominal pain and nausea that started last week. He has a history of lung CA on chemo, chronic abdominal pain, CAD, GERD. He was recently hospitalized for same symptoms but had to leave yesterday AMA in order to take care of his pets but said he would return to complete care. He reports his pain was uncontrolled at home secondary to nausea and being unable to take his usual opiate pain medications. No fever. No hematemesis or melena. He reports diarrhea but this is chronic and unchanged.    The history is provided by the patient. No language interpreter was used.    Past Medical History:  Diagnosis Date  . Anxiety states 08/04/2010  . Chronic airway obstruction, not elsewhere classified 08/04/2010  . Chronic pain syndrome 08/04/2010  . Chronic pancreatitis ([place]) 08/04/2010  . Coronary artery disease   . GERD (gastroesophageal reflux disease)   . Hiatal hernia    EGD 01/18/13  . History of radiation therapy 08/10/16-09/29/16   left lung 2 Gy in 30 fractions  . Insomnia 08/04/2010  . Intellectual disability   . Lung nodule 05/20/2016  . Other abnormal glucose 08/04/2010  . Pancreatitis chronic 08/04/2010  . Protein calorie malnutrition ([place]) 2010  . Seizure ([place]) 08/04/2010  . Severe anemia 05/20/2016  . 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 Splenic vein thrombosis    Chronic.  . Tobacco use disorder 08/04/2010  . Unspecified essential hypertension 08/04/2010    Patient Active Problem List   Diagnosis Date Noted  . Abdominal pain 10/21/2016  . Hypokalemia 10/20/2016  . Diarrhea 10/20/2016  . Localization-related (focal) (partial) symptomatic epilepsy and epileptic syndromes with  complex partial seizures, not intractable, without status epilepticus ([place]) 10/09/2016  . History of stroke 10/09/2016  . Dehydration 09/24/2016  . 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 Encounter for antineoplastic chemotherapy 07/30/2016  . Goals of care, counseling/discussion 07/30/2016  . Lung nodule 05/20/2016  . Anemia 05/20/2016  . Pancreatic lesion   . Malnutrition ([place])   . Phlegmon of pancreas 05/09/2016  . Acute on chronic pancreatitis ([place]) 05/09/2016  . Lymphadenopathy 05/09/2016  . Hx of colonic polyps 04/23/2016  . Atherosclerosis of aorta ([place]) 04/09/2016  . History of colonic polyps 02/19/2016  . Dizziness 06/10/2015  . Pancreatic pseudocyst 03/13/2013  . Cellulitis 01/24/2013  . Gastritis 01/24/2013  . Pancreatitis 01/23/2013  . Irritation around percutaneous endoscopic gastrostomy (PEG) tube site ([place]) 01/23/2013  . Hyponatremia 01/19/2013  . Hiatal hernia 01/19/2013  . Acute blood loss anemia 01/18/2013  . 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 Coronary artery disease   . Upper GI bleed 01/17/2013  . Chronic pancreatitis ([place]) 01/17/2013  . Protein-calorie malnutrition, severe ([place]) 01/10/2013  . Hematemesis 01/09/2013  . Pancreatitis, acute 01/09/2013  . Seizure disorder ([place]) 01/09/2013  . Syncope 01/09/2013  . Acute pancreatitis 10/03/2012  . Leukocytosis 10/03/2012  . Transaminitis 10/03/2012  . Chest pain 10/03/2012  . Pain around PEG tube site 03/09/2011  . Hypertension 11/05/2010  . GERD (gastroesophageal reflux disease) 11/05/2010  . Chronic pain syndrome 08/04/2010  . Tobacco use disorder 08/04/2010  Past Surgical History:  Procedure Laterality Date  . COLONOSCOPY WITH PROPOFOL N/A 06/26/2016   Procedure: COLONOSCOPY WITH PROPOFOL;  Surgeon: Rogene Houston, MD;  [facility];  Service: Endoscopy;  Laterality: N/A;  10:30  . CORONARY ANGIOPLASTY WITH STENT PLACEMENT    . ESOPHAGOGASTRODUODENOSCOPY  N/A 01/18/2013   Procedure: ESOPHAGOGASTRODUODENOSCOPY (EGD);  Surgeon: Rogene Houston, MD;  [facility];  Service: Endoscopy;  Laterality: N/A;  . EUS  03/26/2009   [facility]  . GASTROSTOMY-JEJEUNOSTOMY TUBE CHANGE/PLACEMENT  12/11/2008   [facility]  . I&D EXTREMITY Left 09/12/2012   Procedure: IRRIGATION AND DEBRIDEMENT LEFT FIFTH FINGER;  Surgeon: Roseanne Kaufman, MD;  Location: [facility];  Service: Orthopedics;  Laterality: Left;  . IR FLUORO GUIDE PORT INSERTION RIGHT  08/31/2016  . IR US GUIDE VASC ACCESS RIGHT  08/31/2016  . JEJUNOSTOMY FEEDING TUBE  07/13/2010  . NERVE AND TENDON REPAIR Left 09/12/2012   Procedure: NERVE AND TENDON REPAIR LEFT FIFTH FINGER;  Surgeon: Roseanne Kaufman, MD;  Location: [facility];  Service: Orthopedics;  Laterality: Left;  . OPEN REDUCTION INTERNAL FIXATION (ORIF) METACARPAL Left 09/12/2012   Procedure: OPEN REDUCTION INTERNAL FIXATION LEFT FIFTH FINGER;  Surgeon: Roseanne Kaufman, MD;  Location: [facility];  Service: Orthopedics;  Laterality: Left;  . PANCREATIC PSEUDOCYST DRAINAGE  07/13/2010  . PORTACATH PLACEMENT  11/28/2008   [facility]  . VIDEO BRONCHOSCOPY WITH ENDOBRONCHIAL ULTRASOUND N/A 07/24/2016   Procedure: VIDEO BRONCHOSCOPY WITH ENDOBRONCHIAL ULTRASOUND;  Surgeon: Grace Isaac, MD;  Location: [facility];  Service: Thoracic;  Laterality: N/A;       Home Medications    Prior to Admission medications   Medication Sig Start Date End Date Taking? Authorizing Provider  albuterol (PROVENTIL HFA;VENTOLIN HFA) 108 (90 Base) MCG/ACT inhaler Inhale 2 puffs into the lungs every 6 (six) hours as needed for wheezing or shortness of breath. 09/01/16  Yes Curcio, Roselie Awkward, NP  amitriptyline (ELAVIL) 25 MG tablet TAKE ONE TABLET BY MOUTH EVERY NIGHT AT BEDTIME 10/22/16  Yes [name], FNP  amLODipine (NORVASC) 10 MG tablet Take 0.5 tablets (5 mg total) by mouth daily. 10/04/16  Yes [name], FNP  ciprofloxacin (CIPRO) 500 MG tablet Take 1  tablet (500 mg total) by mouth 2 (two) times daily. 10/24/16 11/03/16 Yes Robbie Lis, MD  CREON [phone] units CPEP TAKE TWO CAPSULES BY MOUTH BEFORE MEALS 08/20/16  Yes [name], FNP  DULoxetine (CYMBALTA) 30 MG capsule Take 1 capsule (30 mg total) by mouth 2 (two) times daily. 07/28/16  Yes [name], FNP  FEROSUL 325 (65 Fe) MG tablet TAKE ONE (1) TABLET BY MOUTH EVERY DAY WITH BREAKFAST 10/22/16  Yes [name], FNP  gabapentin (NEURONTIN) 300 MG capsule TAKE ONE (1) CAPSULE BY MOUTH 4 TIMES DAILY AS NEEDED Patient taking differently: TAKE 300 mg four times daily BY MOUTH as needed for NERVE PAIN 08/20/16  Yes [name], FNP  lidocaine-prilocaine (EMLA) cream Apply 1 application topically as needed. Patient taking differently: Apply 1 application topically as needed (access port).  09/01/16  Yes Curcio, Roselie Awkward, NP  metroNIDAZOLE (FLAGYL) 500 MG tablet Take 1 tablet (500 mg total) by mouth 3 (three) times daily. 10/24/16 11/03/16 Yes Robbie Lis, MD  mirtazapine (REMERON) 15 MG tablet TAKE ONE TABLET BY MOUTH EVERY EVENING Patient taking differently: TAKE 15 MG TABLET BY MOUTH EVERY EVENING 09/17/16  Yes [name], FNP  nicotine (NICODERM CQ) 14 mg/24hr  patch Place 1 patch (14 mg total) onto the skin daily. 08/27/16  Yes [name], FNP  Nutritional Supplements (FEEDING SUPPLEMENT, VITAL AF 1.2 CAL,) LIQD Place 1,000 mLs into feeding tube daily. At 26ml/hr 05/16/16  Yes Eugenie Filler, MD  ondansetron (ZOFRAN) 4 MG tablet Take 1 tablet (4 mg total) by mouth every 6 (six) hours as needed for nausea. 05/15/16  Yes Eugenie Filler, MD  Oxycodone HCl 10 MG TABS Take 1 tablet (10 mg total) by mouth every 6 (six) hours as needed. 10/24/16  Yes Robbie Lis, MD  pantoprazole (PROTONIX) 40 MG tablet TAKE ONE (1) TABLET BY MOUTH EVERY DAY Patient taking differently: take 40 mg tablet, by mouth, once daily 08/20/16  Yes [name], FNP  phenytoin  (DILANTIN) 300 MG ER capsule Take 1 capsule (300 mg total) by mouth at bedtime. 10/09/16  Yes Cameron Sprang, MD  potassium chloride SA (K-DUR,KLOR-CON) 20 MEQ tablet Take 1 tablet (20 mEq total) by mouth 2 (two) times daily. 10/15/16  Yes Hedges, Dellis Filbert, PA-C  prochlorperazine (COMPAZINE) 10 MG tablet Take 1 tablet (10 mg total) by mouth every 6 (six) hours as needed for nausea or vomiting. 09/23/16  Yes Curt Bears, MD  promethazine (PHENERGAN) 25 MG tablet Take 1 tablet (25 mg total) by mouth every 6 (six) hours as needed for nausea or vomiting. 10/24/16  Yes Robbie Lis, MD  ranitidine (ZANTAC) 150 MG capsule TAKE 1 CAPSULE BY MOUTH TWICE DAILY Patient taking differently: take 150 mg capsule, by mouth, twice daily 06/13/15  Yes Micheline Chapman, NP  sucralfate (CARAFATE) 1 GM/10ML suspension Take 10 mLs (1 g total) by mouth 4 (four) times daily -  with meals and at bedtime. 08/25/16  Yes Gery Pray, MD  Water For Irrigation, Sterile (FREE WATER) SOLN Place 200 mLs into feeding tube every 8 (eight) hours. 05/15/16  Yes Eugenie Filler, MD  Adhesive Tape [facility] ADHESIVE SURG 1/2"X10YD) TAPE 1 each by Does not apply route 2 (two) times daily as needed. 03/09/16   [name], FNP  Gauze Pads & Dressings (BIOGUARD GAUZE SPONGES) 4"X4" PADS 1 each by Does not apply route 2 (two) times daily. 03/09/16   [name], FNP  Wound Dressings (SONAFINE EX) Apply topically.    [provider]    Family History Family History  Problem Relation Age of Onset  . Alcoholism Father   . Alcoholism Mother     Social History Social History  Substance Use Topics  . Smoking status: Current Every Day Smoker    Packs/day: 0.25    Years: 20.00    Types: Cigarettes  . Smokeless tobacco: Never Used     Comment: 1 pack a day since age 84  . Alcohol use No     Comment: No etoh since 5-6 yrs     Allergies   Latex   Review of Systems Review of Systems  Constitutional: Negative  for chills, diaphoresis and fever.  HENT: Negative.   Respiratory: Negative.  Negative for shortness of breath.   Cardiovascular: Negative.  Negative for chest pain.  Gastrointestinal: Positive for abdominal pain, diarrhea, nausea and vomiting. Negative for blood in stool.  Genitourinary: Negative.   Musculoskeletal: Negative.   Skin: Negative.   Neurological: Negative.  Negative for syncope and light-headedness.     Physical Exam Updated Vital Signs BP 136/69   Pulse 69   Temp 98.6 F (37 C) (Oral)   Resp 18  SpO2 98%   Physical Exam  Constitutional: He is oriented to person, place, and time. He appears well-developed and well-nourished. No distress.  Frail, cachectic appearing male in NAD.  HENT:  Head: Normocephalic.  Nose: Nose normal.  Mouth/Throat: Oropharynx is clear and moist.  Eyes: Conjunctivae are normal. No scleral icterus.  Neck: Normal range of motion. Neck supple.  Cardiovascular: Normal rate and regular rhythm.   No murmur heard. Pulmonary/Chest: Effort normal and breath sounds normal. He has no wheezes. He has no rales. He exhibits no tenderness.  Abdominal: Soft. Bowel sounds are normal. There is tenderness (mildly tender to LUQ and epigastric abdomen.). There is no rebound and no guarding.  PEG tube in place. Site unremarkable.   Musculoskeletal: Normal range of motion. He exhibits no edema.  Neurological: He is alert and oriented to person, place, and time.  Skin: Skin is warm and dry. No rash noted. He is not diaphoretic.  Psychiatric: He has a normal mood and affect.     ED Treatments / Results  Labs (all labs ordered are listed, but only abnormal results are displayed) Labs Reviewed  CBC WITH DIFFERENTIAL/PLATELET - Abnormal; Notable for the following:       Result Value   RBC 2.43 (*)    Hemoglobin 8.0 (*)    HCT 23.1 (*)    Platelets 92 (*)    All other components within normal limits  COMPREHENSIVE METABOLIC PANEL - Abnormal; Notable for  the following:    Potassium 3.2 (*)    BUN <5 (*)    Creatinine, Ser 0.55 (*)    Calcium 8.0 (*)    Total Protein 5.5 (*)    Albumin 2.8 (*)    AST 11 (*)    ALT 9 (*)    Total Bilirubin 0.2 (*)    All other components within normal limits    EKG  EKG Interpretation None       Radiology No results found.  Procedures Procedures (including critical care time)  Medications Ordered in ED Medications  sodium chloride 0.9 % bolus 1,000 mL (0 mLs Intravenous Stopped 10/25/16 0216)  metoCLOPramide (REGLAN) injection 10 mg (10 mg Intravenous Given 10/25/16 0213)     Initial Impression / Assessment and Plan / ED Course  I have reviewed the triage vital signs and the nursing notes.  Pertinent labs & imaging results that were available during my care of the patient were reviewed by me and considered in my medical decision making (see chart for details).     Patient returns to the hospital after signing out yesterday against medical advice where he was admitted for evaluation and management of abdominal pain and nausea. Chart reviewed. A pyloric mass was found and biopsied. Biopsies not resulted at this time.   Lab studies are at baseline when compared to previous lab studies. VSS. No vomiting in ED. He was given Reglan for nausea and reports symptoms have resolved.   No discharge summary as of yet that outlines plan of care. However, the patient appears stable. No vomiting. He has pain medications at home but nothing for nausea. Discussed with Dr. Randal Buba and with patient. He is comfortable going home and will se ehis doctor this week.   Final Clinical Impressions(s) / ED Diagnoses   Final diagnoses:  None   1. Abdominal pain, acute on chronic 2. Nausea and vomiting.  3. Gastric mass 4. History of lung CA  New Prescriptions New Prescriptions   No medications on file  Charlann Lange, PA-C 10/25/16 0532    Charlann Lange, PA-C 10/25/16 Nicholes Calamity, April,  MD 10/25/16 3007

## 2016-10-25 NOTE — Discharge Instructions (Signed)
Follow up with Dr. Oletta Lamas for results of pathology on the gastric mass discovered during your upper recent endoscopy.

## 2016-10-26 ENCOUNTER — Telehealth: Payer: Self-pay | Admitting: Oncology

## 2016-10-26 ENCOUNTER — Encounter: Payer: Self-pay | Admitting: Radiation Oncology

## 2016-10-26 ENCOUNTER — Other Ambulatory Visit: Payer: Self-pay | Admitting: Medical Oncology

## 2016-10-26 ENCOUNTER — Ambulatory Visit
Admission: RE | Admit: 2016-10-26 | Discharge: 2016-10-26 | Disposition: A | Discharge status: Home / Self Care | Payer: Medicaid Other | Source: Ambulatory Visit | Attending: Radiation Oncology | Admitting: Radiation Oncology

## 2016-10-26 DIAGNOSIS — C3412 Malignant neoplasm of upper lobe, left bronchus or lung: Secondary | ICD-10-CM | POA: Diagnosis present

## 2016-10-26 DIAGNOSIS — Z5111 Encounter for antineoplastic chemotherapy: Secondary | ICD-10-CM

## 2016-10-26 MED ORDER — PROCHLORPERAZINE MALEATE 10 MG PO TABS: 10.0000 mg | ORAL_TABLET | Freq: Four times a day (QID) | ORAL | 0 refills | Status: AC | PRN
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
## 2016-10-26 NOTE — Telephone Encounter (Signed)
Spoke with pt's fiance, Myrtie.  They were asking about DMV paperwork.  I Had faxed it to the [facility] on 10/4, but pt also needs paperwork to take to an upcoming court date as well as for his own records.  Copy made and left at the front desk for pick up.

## 2016-10-26 NOTE — Telephone Encounter (Addendum)
Called ER and spoke to [place], Therapist, sports, ER charge nurse and gave report of patient's condition (patient dehydrated, vomiting and a current chemotherapy patient).  Asked if patient could be seen in the ER and was told that there was a 5 hour wait.  Dr. Sondra Come was advised of the wait time and he recommended asking the patient if he would like to wait.  If not he recommended that patient come in for labs tomorrow.  Patient said he did not want to wait 5 hours.  He said he can come in at 11 am tomorrow for labs.  Advised him to call 911 during the night if he starts to feel worse.  Appointment for labs entered for 11 am on 10/26/16.

## 2016-10-26 NOTE — Telephone Encounter (Signed)
I filled out the Neurological part on his DMV paperwork a while back, is this what she is talking about? They were going to have his other doctors fill out the other parts, but I filled out the Neuro part. Thanks

## 2016-10-26 NOTE — Progress Notes (Signed)
Marcus Brooks is here for follow up after treatment to his left lung.  He was in the hospital last week for vomiting blood.  A biopsy was done during an endoscopy.  He left AMA.  He reports still throwing up about 14-15 times per day.  He is not putting anything through his tube now except water.  He is able to eat by mouth but said he eventually throws it back up.  He denies seeing any blood.  He said he does not know if he has shortness of breath.  He does having a frequent cough.  The skin on his left chest and back has hyperpigmentation.  He reports having pain in his lower left abdomen that he is rating at a 20/10.  He has not taken any pain medication in "a couple of days."  Orthostatic vitals taken: bp sitting 136/77, hr 76, bp standing 116/72, hr 80.  BP 116/72 (BP Location: Right Arm, Patient Position: Standing)   Pulse 80 Comment: STANDING  Temp 98.7 F (37.1 C) (Oral)   Ht 5\' 6"  (1.676 m)   Wt 101 lb 12.8 oz (46.2 kg)   SpO2 100%   BMI 16.43 kg/m   Wt Readings from Last 3 Encounters:  10/26/16 101 lb 12.8 oz (46.2 kg)  10/24/16 103 lb 6.3 oz (46.9 kg)  10/20/16 96 lb 1.6 oz (43.6 kg)

## 2016-10-26 NOTE — Telephone Encounter (Signed)
Calling to check on the paper work status she needs this for a court date

## 2016-10-27 ENCOUNTER — Telehealth: Payer: Self-pay | Admitting: Oncology

## 2016-10-27 ENCOUNTER — Other Ambulatory Visit: Payer: Medicaid Other

## 2016-10-27 ENCOUNTER — Other Ambulatory Visit: Payer: Self-pay | Admitting: Radiation Oncology

## 2016-10-27 ENCOUNTER — Ambulatory Visit
Admission: RE | Admit: 2016-10-27 | Discharge: 2016-10-27 | Disposition: A | Discharge status: Home / Self Care | Payer: Medicaid Other | Source: Ambulatory Visit | Attending: Internal Medicine | Admitting: Internal Medicine

## 2016-10-27 DIAGNOSIS — C3412 Malignant neoplasm of upper lobe, left bronchus or lung: Secondary | ICD-10-CM

## 2016-10-27 LAB — COMPREHENSIVE METABOLIC PANEL
ALK PHOS: 105 U/L (ref 40–150)
ALT: 9 U/L (ref 0–55)
ANION GAP: 8 meq/L (ref 3–11)
AST: 18 U/L (ref 5–34)
Albumin: 2.9 g/dL — ABNORMAL LOW (ref 3.5–5.0)
BUN: <2 mg/dL — ABNORMAL LOW (ref 7.0–26.0)
CALCIUM: 8.6 mg/dL (ref 8.4–10.4)
CHLORIDE: 106 meq/L (ref 98–109)
CO2: 27 mEq/L (ref 22–29)
CREATININE: 0.8 mg/dL (ref 0.7–1.3)
Glucose: 137 mg/dl (ref 70–140)
Potassium: 3.3 mEq/L — ABNORMAL LOW (ref 3.5–5.1)
Sodium: 141 mEq/L (ref 136–145)
TOTAL PROTEIN: 6.3 g/dL — AB (ref 6.4–8.3)
Total Bilirubin: 0.29 mg/dL (ref 0.20–1.20)

## 2016-10-27 LAB — CBC WITH DIFFERENTIAL/PLATELET
BASO%: 0.6 % (ref 0.0–2.0)
Basophils Absolute: 0 10*3/uL (ref 0.0–0.1)
EOS ABS: 0.1 10*3/uL (ref 0.0–0.5)
EOS%: 1.5 % (ref 0.0–7.0)
HEMATOCRIT: 29.7 % — AB (ref 38.4–49.9)
HGB: 9.9 g/dL — ABNORMAL LOW (ref 13.0–17.1)
LYMPH#: 1.1 10*3/uL (ref 0.9–3.3)
LYMPH%: 19.6 % (ref 14.0–49.0)
MCH: 32.5 pg (ref 27.2–33.4)
MCHC: 33.3 g/dL (ref 32.0–36.0)
MCV: 97.4 fL (ref 79.3–98.0)
MONO#: 0.5 10*3/uL (ref 0.1–0.9)
MONO%: 9.7 % (ref 0.0–14.0)
NEUT#: 3.7 10*3/uL (ref 1.5–6.5)
NEUT%: 68.6 % (ref 39.0–75.0)
PLATELETS: 137 10*3/uL — AB (ref 140–400)
RBC: 3.05 10*6/uL — AB (ref 4.20–5.82)
RDW: 14.9 % — ABNORMAL HIGH (ref 11.0–14.6)
WBC: 5.5 10*3/uL (ref 4.0–10.3)

## 2016-10-27 LAB — MAGNESIUM: MAGNESIUM: 1.6 mg/dL (ref 1.5–2.5)

## 2016-10-27 NOTE — Telephone Encounter (Signed)
Called patient to see if he would like to stay for his lab results today.  He said he just wanted to go home and lay down and asked that we call him with the results.

## 2016-10-28 ENCOUNTER — Telehealth: Payer: Self-pay | Admitting: Oncology

## 2016-10-28 ENCOUNTER — Other Ambulatory Visit: Payer: Medicaid Other

## 2016-10-28 ENCOUNTER — Ambulatory Visit (HOSPITAL_BASED_OUTPATIENT_CLINIC_OR_DEPARTMENT_OTHER): Payer: Medicaid Other

## 2016-10-28 ENCOUNTER — Ambulatory Visit (HOSPITAL_BASED_OUTPATIENT_CLINIC_OR_DEPARTMENT_OTHER): Payer: Medicaid Other | Admitting: Oncology

## 2016-10-28 VITALS — BP 130/67 | HR 64 | Temp 98.4°F | Resp 18

## 2016-10-28 DIAGNOSIS — E876 Hypokalemia: Secondary | ICD-10-CM

## 2016-10-28 DIAGNOSIS — C3412 Malignant neoplasm of upper lobe, left bronchus or lung: Secondary | ICD-10-CM

## 2016-10-28 DIAGNOSIS — J449 Chronic obstructive pulmonary disease, unspecified: Secondary | ICD-10-CM

## 2016-10-28 DIAGNOSIS — Z79891 Long term (current) use of opiate analgesic: Secondary | ICD-10-CM

## 2016-10-28 DIAGNOSIS — R531 Weakness: Secondary | ICD-10-CM

## 2016-10-28 DIAGNOSIS — R52 Pain, unspecified: Secondary | ICD-10-CM

## 2016-10-28 DIAGNOSIS — R197 Diarrhea, unspecified: Secondary | ICD-10-CM | POA: Diagnosis not present

## 2016-10-28 DIAGNOSIS — R110 Nausea: Secondary | ICD-10-CM

## 2016-10-28 DIAGNOSIS — Z5111 Encounter for antineoplastic chemotherapy: Secondary | ICD-10-CM

## 2016-10-28 DIAGNOSIS — K861 Other chronic pancreatitis: Secondary | ICD-10-CM | POA: Diagnosis not present

## 2016-10-28 MED ORDER — SODIUM CHLORIDE 0.9 % IV SOLN: Freq: Once | INTRAVENOUS | Status: DC

## 2016-10-28 MED ORDER — SODIUM CHLORIDE 0.9% FLUSH
10.0000 mL | Freq: Once | INTRAVENOUS | Status: AC
  Administered 2016-10-28: 10 mL via INTRAVENOUS
  Filled 2016-10-28: qty 10

## 2016-10-28 MED ORDER — ONDANSETRON HCL 4 MG/2ML IJ SOLN
INTRAMUSCULAR | Status: AC
  Filled 2016-10-28: qty 4

## 2016-10-28 MED ORDER — HEPARIN SOD (PORK) LOCK FLUSH 100 UNIT/ML IV SOLN
500.0000 [IU] | Freq: Once | INTRAVENOUS | Status: AC
  Administered 2016-10-28: 500 [IU] via INTRAVENOUS
  Filled 2016-10-28: qty 5

## 2016-10-28 MED ORDER — SODIUM CHLORIDE 0.9 % IV SOLN
Freq: Once | INTRAVENOUS | Status: AC
  Administered 2016-10-28: 12:00:00 via INTRAVENOUS
  Filled 2016-10-28: qty 1000

## 2016-10-28 MED ORDER — ONDANSETRON HCL 4 MG/2ML IJ SOLN
8.0000 mg | Freq: Once | INTRAMUSCULAR | Status: AC
  Administered 2016-10-28: 8 mg via INTRAVENOUS

## 2016-10-28 MED ORDER — OXYCODONE HCL 5 MG PO TABS: 5.0000 mg | ORAL_TABLET | Freq: Four times a day (QID) | ORAL | 0 refills | Status: AC | PRN

## 2016-10-28 MED FILL — oxyCODONE HCL 5 MG TABS: 5 | 7 days supply | Qty: 30 | Fill #0

## 2016-10-28 NOTE — Telephone Encounter (Signed)
Called Marcus Brooks and advised her of [place]'s lab results from yesterday.  She was wondering if he needs labs again today before chemotherapy.  Left a message for Dr. Worthy Flank nurse.  Also called infusion and talked to Skip Estimable, RN who said he does not need to come in for labs before chemo today.  Called Marcus Brooks back and let her know that [place] does not need to come in for labs.  He just needs to come in for the infusion appointment at 10:15.

## 2016-10-28 NOTE — Progress Notes (Signed)
1125 - Per MD, no chemo today. IVF with 29mEq K+ over 2 hours ordered. Patient and family voiced understanding.   1415 - after infusion completed patient states that he doesn't "feel any better" than initially. Vital signs stable. Dr. Julien Nordmann consulted and said there were no further interventions that we could offer and that the patient could be transferred to the ED if he wants. Patient refused to go to ED and stated "i'll go home and see if it goes away". Patient instructed to call 911 or return to an ED close by if any symptoms worsen. Patient and fiance voiced understanding. Patient discharged to home.   Wylene Simmer, BSN, RN 10/28/2016 2:18 PM

## 2016-10-28 NOTE — Patient Instructions (Signed)
Dehydration, Adult Dehydration is when there is not enough fluid or water in your body. This happens when you lose more fluids than you take in. Dehydration can range from mild to very bad. It should be treated right away to keep it from getting very bad. Symptoms of mild dehydration may include:  Thirst.  Dry lips.  Slightly dry mouth.  Dry, warm skin.  Dizziness. Symptoms of moderate dehydration may include:  Very dry mouth.  Muscle cramps.  Dark pee (urine). Pee may be the color of tea.  Your body making less pee.  Your eyes making fewer tears.  Heartbeat that is uneven or faster than normal (palpitations).  Headache.  Light-headedness, especially when you stand up from sitting.  Fainting (syncope). Symptoms of very bad dehydration may include:  Changes in skin, such as: ? Cold and clammy skin. ? Blotchy (mottled) or pale skin. ? Skin that does not quickly return to normal after being lightly pinched and let go (poor skin turgor).  Changes in body fluids, such as: ? Feeling very thirsty. ? Your eyes making fewer tears. ? Not sweating when body temperature is high, such as in hot weather. ? Your body making very little pee.  Changes in vital signs, such as: ? Weak pulse. ? Pulse that is more than 100 beats a minute when you are sitting still. ? Fast breathing. ? Low blood pressure.  Other changes, such as: ? Sunken eyes. ? Cold hands and feet. ? Confusion. ? Lack of energy (lethargy). ? Trouble waking up from sleep. ? Short-term weight loss. ? Unconsciousness. Follow these instructions at home:  If told by your doctor, drink an ORS: ? Make an ORS by using instructions on the package. ? Start by drinking small amounts, about  cup (120 mL) every 5-10 minutes. ? Slowly drink more until you have had the amount that your doctor said to have.  Drink enough clear fluid to keep your pee clear or pale yellow. If you were told to drink an ORS, finish the ORS  first, then start slowly drinking clear fluids. Drink fluids such as: ? Water. Do not drink only water by itself. Doing that can make the salt (sodium) level in your body get too low (hyponatremia). ? Ice chips. ? Fruit juice that you have added water to (diluted). ? Low-calorie sports drinks.  Avoid: ? Alcohol. ? Drinks that have a lot of sugar. These include high-calorie sports drinks, fruit juice that does not have water added, and soda. ? Caffeine. ? Foods that are greasy or have a lot of fat or sugar.  Take over-the-counter and prescription medicines only as told by your doctor.  Do not take salt tablets. Doing that can make the salt level in your body get too high (hypernatremia).  Eat foods that have minerals (electrolytes). Examples include bananas, oranges, potatoes, tomatoes, and spinach.  Keep all follow-up visits as told by your doctor. This is important. Contact a doctor if:  You have belly (abdominal) pain that: ? Gets worse. ? Stays in one area (localizes).  You have a rash.  You have a stiff neck.  You get angry or annoyed more easily than normal (irritability).  You are more sleepy than normal.  You have a harder time waking up than normal.  You feel: ? Weak. ? Dizzy. ? Very thirsty.  You have peed (urinated) only a small amount of very dark pee during 6-8 hours. Get help right away if:  You have symptoms of   very bad dehydration.  You cannot drink fluids without throwing up (vomiting).  Your symptoms get worse with treatment.  You have a fever.  You have a very bad headache.  You are throwing up or having watery poop (diarrhea) and it: ? Gets worse. ? Does not go away.  You have blood or something green (bile) in your throw-up.  You have blood in your poop (stool). This may cause poop to look black and tarry.  You have not peed in 6-8 hours.  You pass out (faint).  Your heart rate when you are sitting still is more than 100 beats a  minute.  You have trouble breathing. This information is not intended to replace advice given to you by your health care provider. Make sure you discuss any questions you have with your health care provider. Document Released: 10/25/2008 Document Revised: 07/19/2015 Document Reviewed: 02/22/2015 Elsevier Interactive Patient Education  2018 [employer].  Hypokalemia Hypokalemia means that the amount of potassium in the blood is lower than normal.Potassium is a chemical that helps regulate the amount of fluid in the body (electrolyte). It also stimulates muscle tightening (contraction) and helps nerves work properly.Normally, most of the body's potassium is inside of cells, and only a very small amount is in the blood. Because the amount in the blood is so small, minor changes to potassium levels in the blood can be life-threatening. What are the causes? This condition may be caused by:  Antibiotic medicine.  Diarrhea or vomiting. Taking too much of a medicine that helps you have a bowel movement (laxative) can cause diarrhea and lead to hypokalemia.  Chronic kidney disease (CKD).  Medicines that help the body get rid of excess fluid (diuretics).  Eating disorders, such as bulimia.  Low magnesium levels in the body.  Sweating a lot.  What are the signs or symptoms? Symptoms of this condition include:  Weakness.  Constipation.  Fatigue.  Muscle cramps.  Mental confusion.  Skipped heartbeats or irregular heartbeat (palpitations).  Tingling or numbness.  How is this diagnosed? This condition is diagnosed with a blood test. How is this treated? Hypokalemia can be treated by taking potassium supplements by mouth or adjusting the medicines that you take. Treatment may also include eating more foods that contain a lot of potassium. If your potassium level is very low, you may need to get potassium through an IV tube in one of your veins and be monitored in the  hospital. Follow these instructions at home:  Take over-the-counter and prescription medicines only as told by your health care provider. This includes vitamins and supplements.  Eat a healthy diet. A healthy diet includes fresh fruits and vegetables, whole grains, healthy fats, and lean proteins.  If instructed, eat more foods that contain a lot of potassium, such as: ? Nuts, such as peanuts and pistachios. ? Seeds, such as sunflower seeds and pumpkin seeds. ? Peas, lentils, and lima beans. ? Whole grain and bran cereals and breads. ? Fresh fruits and vegetables, such as apricots, avocado, bananas, cantaloupe, kiwi, oranges, tomatoes, asparagus, and potatoes. ? Orange juice. ? Tomato juice. ? Red meats. ? Yogurt.  Keep all follow-up visits as told by your health care provider. This is important. Contact a health care provider if:  You have weakness that gets worse.  You feel your heart pounding or racing.  You vomit.  You have diarrhea.  You have diabetes (diabetes mellitus) and you have trouble keeping your blood sugar (glucose) in your target  range. Get help right away if:  You have chest pain.  You have shortness of breath.  You have vomiting or diarrhea that lasts for more than 2 days.  You faint. This information is not intended to replace advice given to you by your health care provider. Make sure you discuss any questions you have with your health care provider. Document Released: 12/29/2004 Document Revised: 08/17/2015 Document Reviewed: 08/17/2015 Elsevier Interactive Patient Education  2018 [employer].

## 2016-10-29 ENCOUNTER — Other Ambulatory Visit: Payer: Self-pay

## 2016-10-29 ENCOUNTER — Ambulatory Visit: Payer: Medicaid Other | Admitting: Radiation Oncology

## 2016-10-29 ENCOUNTER — Inpatient Hospital Stay (HOSPITAL_COMMUNITY)
Admission: EM | Admit: 2016-10-29 | Discharge: 2016-10-31 | DRG: 811 | Disposition: A | Discharge status: Home / Self Care | Payer: Medicaid Other | Attending: Internal Medicine | Admitting: Internal Medicine

## 2016-10-29 ENCOUNTER — Encounter (HOSPITAL_COMMUNITY): Payer: Self-pay

## 2016-10-29 ENCOUNTER — Ambulatory Visit: Payer: Medicaid Other

## 2016-10-29 DIAGNOSIS — F419 Anxiety disorder, unspecified: Secondary | ICD-10-CM | POA: Diagnosis present

## 2016-10-29 DIAGNOSIS — Z923 Personal history of irradiation: Secondary | ICD-10-CM

## 2016-10-29 DIAGNOSIS — R64 Cachexia: Secondary | ICD-10-CM | POA: Diagnosis present

## 2016-10-29 DIAGNOSIS — R197 Diarrhea, unspecified: Secondary | ICD-10-CM | POA: Diagnosis present

## 2016-10-29 DIAGNOSIS — E876 Hypokalemia: Secondary | ICD-10-CM | POA: Diagnosis present

## 2016-10-29 DIAGNOSIS — D62 Acute posthemorrhagic anemia: Principal | ICD-10-CM | POA: Diagnosis present

## 2016-10-29 DIAGNOSIS — J449 Chronic obstructive pulmonary disease, unspecified: Secondary | ICD-10-CM | POA: Diagnosis present

## 2016-10-29 DIAGNOSIS — Z91040 Latex allergy status: Secondary | ICD-10-CM

## 2016-10-29 DIAGNOSIS — K219 Gastro-esophageal reflux disease without esophagitis: Secondary | ICD-10-CM | POA: Diagnosis present

## 2016-10-29 DIAGNOSIS — I700 Atherosclerosis of aorta: Secondary | ICD-10-CM | POA: Diagnosis present

## 2016-10-29 DIAGNOSIS — Z955 Presence of coronary angioplasty implant and graft: Secondary | ICD-10-CM

## 2016-10-29 DIAGNOSIS — I10 Essential (primary) hypertension: Secondary | ICD-10-CM | POA: Diagnosis present

## 2016-10-29 DIAGNOSIS — R1013 Epigastric pain: Secondary | ICD-10-CM

## 2016-10-29 DIAGNOSIS — F17210 Nicotine dependence, cigarettes, uncomplicated: Secondary | ICD-10-CM | POA: Diagnosis present

## 2016-10-29 DIAGNOSIS — Z9221 Personal history of antineoplastic chemotherapy: Secondary | ICD-10-CM

## 2016-10-29 DIAGNOSIS — Z681 Body mass index (BMI) 19 or less, adult: Secondary | ICD-10-CM

## 2016-10-29 DIAGNOSIS — Z79899 Other long term (current) drug therapy: Secondary | ICD-10-CM

## 2016-10-29 DIAGNOSIS — F79 Unspecified intellectual disabilities: Secondary | ICD-10-CM | POA: Diagnosis present

## 2016-10-29 DIAGNOSIS — G894 Chronic pain syndrome: Secondary | ICD-10-CM | POA: Diagnosis present

## 2016-10-29 DIAGNOSIS — C3412 Malignant neoplasm of upper lobe, left bronchus or lung: Secondary | ICD-10-CM | POA: Insufficient documentation

## 2016-10-29 DIAGNOSIS — R911 Solitary pulmonary nodule: Secondary | ICD-10-CM | POA: Diagnosis present

## 2016-10-29 DIAGNOSIS — Z22322 Carrier or suspected carrier of Methicillin resistant Staphylococcus aureus: Secondary | ICD-10-CM

## 2016-10-29 DIAGNOSIS — I2510 Atherosclerotic heart disease of native coronary artery without angina pectoris: Secondary | ICD-10-CM | POA: Diagnosis present

## 2016-10-29 DIAGNOSIS — E43 Unspecified severe protein-calorie malnutrition: Secondary | ICD-10-CM | POA: Diagnosis present

## 2016-10-29 DIAGNOSIS — K861 Other chronic pancreatitis: Secondary | ICD-10-CM | POA: Diagnosis present

## 2016-10-29 DIAGNOSIS — Z931 Gastrostomy status: Secondary | ICD-10-CM

## 2016-10-29 DIAGNOSIS — Z8673 Personal history of transient ischemic attack (TIA), and cerebral infarction without residual deficits: Secondary | ICD-10-CM

## 2016-10-29 DIAGNOSIS — K8681 Exocrine pancreatic insufficiency: Secondary | ICD-10-CM | POA: Diagnosis present

## 2016-10-29 DIAGNOSIS — G4700 Insomnia, unspecified: Secondary | ICD-10-CM | POA: Diagnosis present

## 2016-10-29 DIAGNOSIS — Z510 Encounter for antineoplastic radiation therapy: Secondary | ICD-10-CM | POA: Insufficient documentation

## 2016-10-29 DIAGNOSIS — K449 Diaphragmatic hernia without obstruction or gangrene: Secondary | ICD-10-CM | POA: Diagnosis present

## 2016-10-29 DIAGNOSIS — K920 Hematemesis: Secondary | ICD-10-CM | POA: Diagnosis present

## 2016-10-29 DIAGNOSIS — Z811 Family history of alcohol abuse and dependence: Secondary | ICD-10-CM

## 2016-10-29 LAB — CBC WITH DIFFERENTIAL/PLATELET
Basophils Absolute: 0 10*3/uL (ref 0.0–0.1)
Basophils Relative: 0 %
Eosinophils Absolute: 0.1 10*3/uL (ref 0.0–0.7)
Eosinophils Relative: 1 %
HCT: 31.5 % — ABNORMAL LOW (ref 39.0–52.0)
Hemoglobin: 10.5 g/dL — ABNORMAL LOW (ref 13.0–17.0)
Lymphocytes Relative: 14 %
Lymphs Abs: 1.4 10*3/uL (ref 0.7–4.0)
MCH: 32.6 pg (ref 26.0–34.0)
MCHC: 33.3 g/dL (ref 30.0–36.0)
MCV: 97.8 fL (ref 78.0–100.0)
Monocytes Absolute: 1.2 10*3/uL — ABNORMAL HIGH (ref 0.1–1.0)
Monocytes Relative: 12 %
Neutro Abs: 7.3 10*3/uL (ref 1.7–7.7)
Neutrophils Relative %: 73 %
Platelets: 191 10*3/uL (ref 150–400)
RBC: 3.22 MIL/uL — ABNORMAL LOW (ref 4.22–5.81)
RDW: 14.7 % (ref 11.5–15.5)
WBC: 10.1 10*3/uL (ref 4.0–10.5)

## 2016-10-29 LAB — I-STAT CHEM 8, ED
BUN: <3 mg/dL — ABNORMAL LOW (ref 6–20)
CALCIUM ION: 0.96 mmol/L — AB (ref 1.15–1.40)
CREATININE: 1 mg/dL (ref 0.61–1.24)
Chloride: 85 mmol/L — ABNORMAL LOW (ref 101–111)
Glucose, Bld: 140 mg/dL — ABNORMAL HIGH (ref 65–99)
HCT: 31 % — ABNORMAL LOW (ref 39.0–52.0)
HEMOGLOBIN: 10.5 g/dL — AB (ref 13.0–17.0)
Potassium: 2.2 mmol/L — CL (ref 3.5–5.1)
Sodium: 142 mmol/L (ref 135–145)
TCO2: 44 mmol/L — AB (ref 22–32)

## 2016-10-29 LAB — COMPREHENSIVE METABOLIC PANEL
ALT: 11 U/L — ABNORMAL LOW (ref 17–63)
AST: 24 U/L (ref 15–41)
Albumin: 3.6 g/dL (ref 3.5–5.0)
Alkaline Phosphatase: 117 U/L (ref 38–126)
Anion gap: 15 (ref 5–15)
BUN: <5 mg/dL — ABNORMAL LOW (ref 6–20)
CO2: 41 mmol/L — ABNORMAL HIGH (ref 22–32)
Calcium: 8.6 mg/dL — ABNORMAL LOW (ref 8.9–10.3)
Chloride: 86 mmol/L — ABNORMAL LOW (ref 101–111)
Creatinine, Ser: 0.94 mg/dL (ref 0.61–1.24)
GFR calc Af Amer: >60 mL/min (ref >60)
GFR calc non Af Amer: >60 mL/min (ref >60)
Glucose, Bld: 141 mg/dL — ABNORMAL HIGH (ref 65–99)
Potassium: 2.2 mmol/L — CL (ref 3.5–5.1)
Sodium: 142 mmol/L (ref 135–145)
Total Bilirubin: 0.6 mg/dL (ref 0.3–1.2)
Total Protein: 7.5 g/dL (ref 6.5–8.1)

## 2016-10-29 LAB — I-STAT TROPONIN, ED: Troponin i, poc: 0 ng/mL (ref 0.00–0.08)

## 2016-10-29 LAB — LIPASE, BLOOD: Lipase: 28 U/L (ref 11–51)

## 2016-10-29 MED ORDER — NICOTINE 14 MG/24HR TD PT24
14.0000 mg | MEDICATED_PATCH | Freq: Every day | TRANSDERMAL | Status: DC
  Administered 2016-10-29 – 2016-10-31 (×3): 14 mg via TRANSDERMAL
  Filled 2016-10-29 (×3): qty 1

## 2016-10-29 MED ORDER — SODIUM CHLORIDE 0.9 % IV BOLUS (SEPSIS)
1000.0000 mL | Freq: Once | INTRAVENOUS | Status: AC
  Administered 2016-10-29: 1000 mL via INTRAVENOUS

## 2016-10-29 MED ORDER — DULOXETINE HCL 30 MG PO CPEP
30.0000 mg | ORAL_CAPSULE | Freq: Two times a day (BID) | ORAL | Status: DC
  Administered 2016-10-29 – 2016-10-31 (×4): 30 mg via ORAL
  Filled 2016-10-29 (×4): qty 1

## 2016-10-29 MED ORDER — MORPHINE SULFATE (PF) 4 MG/ML IV SOLN
4.0000 mg | Freq: Once | INTRAVENOUS | Status: AC
  Administered 2016-10-29: 4 mg via INTRAVENOUS
  Filled 2016-10-29: qty 1

## 2016-10-29 MED ORDER — METOCLOPRAMIDE HCL 10 MG PO TABS
10.0000 mg | ORAL_TABLET | Freq: Four times a day (QID) | ORAL | Status: DC
  Administered 2016-10-30 – 2016-10-31 (×6): 10 mg via ORAL
  Filled 2016-10-29 (×6): qty 1

## 2016-10-29 MED ORDER — POTASSIUM CHLORIDE 10 MEQ/100ML IV SOLN
10.0000 meq | INTRAVENOUS | Status: AC
  Administered 2016-10-29 – 2016-10-30 (×4): 10 meq via INTRAVENOUS
  Filled 2016-10-29 (×6): qty 100

## 2016-10-29 MED ORDER — SUCRALFATE 1 GM/10ML PO SUSP
1.0000 g | Freq: Three times a day (TID) | ORAL | Status: DC
  Administered 2016-10-29 – 2016-10-31 (×7): 1 g via ORAL
  Filled 2016-10-29 (×7): qty 10

## 2016-10-29 MED ORDER — ONDANSETRON HCL 4 MG PO TABS: 4.0000 mg | ORAL_TABLET | Freq: Four times a day (QID) | ORAL | Status: DC | PRN

## 2016-10-29 MED ORDER — PROCHLORPERAZINE MALEATE 10 MG PO TABS: 10.0000 mg | ORAL_TABLET | Freq: Four times a day (QID) | ORAL | Status: DC | PRN

## 2016-10-29 MED ORDER — ACETAMINOPHEN 650 MG RE SUPP: 650.0000 mg | Freq: Four times a day (QID) | RECTAL | Status: DC | PRN

## 2016-10-29 MED ORDER — SUCRALFATE 1 GM/10ML PO SUSP
1.0000 g | Freq: Once | ORAL | Status: AC
  Administered 2016-10-29: 1 g via ORAL
  Filled 2016-10-29: qty 10

## 2016-10-29 MED ORDER — LACTATED RINGERS IV SOLN
INTRAVENOUS | Status: DC
  Administered 2016-10-29: 21:00:00 via INTRAVENOUS

## 2016-10-29 MED ORDER — PANTOPRAZOLE SODIUM 40 MG IV SOLR
40.0000 mg | Freq: Two times a day (BID) | INTRAVENOUS | Status: DC
  Administered 2016-10-29 – 2016-10-31 (×4): 40 mg via INTRAVENOUS
  Filled 2016-10-29 (×4): qty 40

## 2016-10-29 MED ORDER — PHENYTOIN SODIUM EXTENDED 100 MG PO CAPS
300.0000 mg | ORAL_CAPSULE | Freq: Every day | ORAL | Status: DC
  Administered 2016-10-29 – 2016-10-30 (×2): 300 mg via ORAL
  Filled 2016-10-29 (×2): qty 3

## 2016-10-29 MED ORDER — ZOLPIDEM TARTRATE 5 MG PO TABS: 5.0000 mg | ORAL_TABLET | Freq: Every evening | ORAL | Status: DC | PRN

## 2016-10-29 MED ORDER — AMITRIPTYLINE HCL 25 MG PO TABS
25.0000 mg | ORAL_TABLET | Freq: Every day | ORAL | Status: DC
  Administered 2016-10-29 – 2016-10-30 (×2): 25 mg via ORAL
  Filled 2016-10-29 (×2): qty 1

## 2016-10-29 MED ORDER — OXYCODONE HCL 5 MG PO TABS
5.0000 mg | ORAL_TABLET | Freq: Four times a day (QID) | ORAL | Status: DC | PRN
  Administered 2016-10-29 – 2016-10-31 (×2): 5 mg via ORAL
  Filled 2016-10-29 (×2): qty 1

## 2016-10-29 MED ORDER — POTASSIUM CHLORIDE 10 MEQ/100ML IV SOLN
10.0000 meq | INTRAVENOUS | Status: AC
  Administered 2016-10-29 (×6): 10 meq via INTRAVENOUS
  Filled 2016-10-29 (×5): qty 100

## 2016-10-29 MED ORDER — HYDROMORPHONE HCL 1 MG/ML IJ SOLN
0.5000 mg | Freq: Once | INTRAMUSCULAR | Status: AC
  Administered 2016-10-29: 0.5 mg via INTRAVENOUS
  Filled 2016-10-29: qty 1

## 2016-10-29 MED ORDER — METOCLOPRAMIDE HCL 5 MG/ML IJ SOLN
10.0000 mg | Freq: Once | INTRAMUSCULAR | Status: AC
  Administered 2016-10-29: 10 mg via INTRAVENOUS
  Filled 2016-10-29: qty 2

## 2016-10-29 MED ORDER — GABAPENTIN 300 MG PO CAPS
300.0000 mg | ORAL_CAPSULE | Freq: Three times a day (TID) | ORAL | Status: DC
  Administered 2016-10-29 – 2016-10-31 (×5): 300 mg via ORAL
  Filled 2016-10-29 (×5): qty 1

## 2016-10-29 MED ORDER — PROMETHAZINE HCL 25 MG PO TABS: 25.0000 mg | ORAL_TABLET | Freq: Four times a day (QID) | ORAL | Status: DC | PRN

## 2016-10-29 MED ORDER — ONDANSETRON HCL 4 MG/2ML IJ SOLN
4.0000 mg | Freq: Four times a day (QID) | INTRAMUSCULAR | Status: DC | PRN
  Administered 2016-10-29: 4 mg via INTRAVENOUS
  Filled 2016-10-29: qty 2

## 2016-10-29 MED ORDER — PANTOPRAZOLE SODIUM 40 MG IV SOLR
40.0000 mg | Freq: Once | INTRAVENOUS | Status: AC
Start: 2016-10-29 — End: 2016-10-29
  Administered 2016-10-29: 40 mg via INTRAVENOUS
  Filled 2016-10-29: qty 40

## 2016-10-29 MED ORDER — PANCRELIPASE (LIP-PROT-AMYL) 12000-38000 UNITS PO CPEP
12000.0000 [IU] | ORAL_CAPSULE | Freq: Three times a day (TID) | ORAL | Status: DC
  Administered 2016-10-30 – 2016-10-31 (×5): 12000 [IU] via ORAL
  Filled 2016-10-29 (×5): qty 1

## 2016-10-29 MED ORDER — MORPHINE SULFATE (PF) 4 MG/ML IV SOLN
1.0000 mg | INTRAVENOUS | Status: DC | PRN
  Administered 2016-10-29 – 2016-10-31 (×7): 1 mg via INTRAVENOUS
  Filled 2016-10-29 (×7): qty 1

## 2016-10-29 MED ORDER — ACETAMINOPHEN 325 MG PO TABS: 650.0000 mg | ORAL_TABLET | Freq: Four times a day (QID) | ORAL | Status: DC | PRN

## 2016-10-29 NOTE — H&P (Signed)
History and Physical    Marcus Brooks DGL:[id number] DOB: 11-19-1960 DOA: 10/29/2016  PCP: [name], FNP   Patient coming from: Home  Chief Complaint: Hematemesis and lower extremity weakness  HPI: Marcus Brooks is a 56 y.o. male with medical history significant of small cell lung cancer, chronic pancreatitis, severe protein calorie malnutrition, and esophageal ulcers with hematemesiswho was recently admitted to this facility on 10/20/2016 for diarrhea as well as nausea and vomiting. He was seen by gastroenterology at that time with endoscopy on 10/23/2016 demonstrating esophageal ulcerations as well as a pyloric mass that was biopsied at that time. The ulcers were noted to be nonbleeding at that time. He did leave AMA with his last hospitalization and returns today with ongoing diarrhea and now lower extremity weakness. He states that the weakness began earlier this morning and it prompted him to call EMS to bring him to the emergency department for evaluation. Additionally, the patient notes that he has had 15 episodes of nausea and vomiting overnight with some blood clots noted in his emesis. He denies any significant bloody output. He has ongoing, dull, achy epigastric abdominal pain that is fairly chronic for him; this does not appear to have changed recently.  ED Course: in the emergency department, he is noted to have stable H&H compared to previously, but he is noted to be severely hypokalemic with a potassium of 2.2.his vital signs are noted to be stable and no other imaging has been performed. He has been given some Carafate, and Protonix for his symptoms and has been started on 63mEq of IV potassium. No changes on telemetry have been noted.  Review of Systems: As per HPI otherwise 10 point review of systems negative.    Past Medical History:  Diagnosis Date  . Anxiety states 08/04/2010  . Chronic airway obstruction, not elsewhere classified 08/04/2010  . Chronic pain syndrome  08/04/2010  . Chronic pancreatitis ([place]) 08/04/2010  . Coronary artery disease   . GERD (gastroesophageal reflux disease)   . Hiatal hernia    EGD 01/18/13  . History of radiation therapy 08/10/16-09/29/16   left lung 2 Gy in 30 fractions  . Insomnia 08/04/2010  . Intellectual disability   . Lung nodule 05/20/2016  . Other abnormal glucose 08/04/2010  . Pancreatitis chronic 08/04/2010  . Protein calorie malnutrition ([place]) 2010  . Seizure ([place]) 08/04/2010  . Severe anemia 05/20/2016  . 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 Splenic vein thrombosis    Chronic.  . Tobacco use disorder 08/04/2010  . Unspecified essential hypertension 08/04/2010    Past Surgical History:  Procedure Laterality Date  . COLONOSCOPY WITH PROPOFOL N/A 06/26/2016   Procedure: COLONOSCOPY WITH PROPOFOL;  Surgeon: Rogene Houston, MD;  [facility];  Service: Endoscopy;  Laterality: N/A;  10:30  . CORONARY ANGIOPLASTY WITH STENT PLACEMENT    . ESOPHAGOGASTRODUODENOSCOPY N/A 01/18/2013   Procedure: ESOPHAGOGASTRODUODENOSCOPY (EGD);  Surgeon: Rogene Houston, MD;  [facility];  Service: Endoscopy;  Laterality: N/A;  . ESOPHAGOGASTRODUODENOSCOPY N/A 10/23/2016   Procedure: ESOPHAGOGASTRODUODENOSCOPY (EGD);  Surgeon: Ronnette Juniper, MD;  Location: [facility];  Service: Gastroenterology;  Laterality: N/A;  . EUS  03/26/2009   [facility]  . GASTROSTOMY-JEJEUNOSTOMY TUBE CHANGE/PLACEMENT  12/11/2008   [facility]  . I&D EXTREMITY Left 09/12/2012   Procedure: IRRIGATION AND DEBRIDEMENT LEFT FIFTH FINGER;  Surgeon: Roseanne Kaufman, MD;  Location: [facility];  Service: Orthopedics;  Laterality: Left;  .  IR FLUORO GUIDE PORT INSERTION RIGHT  08/31/2016  . IR US GUIDE VASC ACCESS RIGHT  08/31/2016  . JEJUNOSTOMY FEEDING TUBE  07/13/2010  . NERVE AND TENDON REPAIR Left 09/12/2012   Procedure: NERVE AND TENDON REPAIR LEFT FIFTH FINGER;  Surgeon: Roseanne Kaufman, MD;  Location: [facility] [place];   Service: Orthopedics;  Laterality: Left;  . OPEN REDUCTION INTERNAL FIXATION (ORIF) METACARPAL Left 09/12/2012   Procedure: OPEN REDUCTION INTERNAL FIXATION LEFT FIFTH FINGER;  Surgeon: Roseanne Kaufman, MD;  Location: [facility];  Service: Orthopedics;  Laterality: Left;  . PANCREATIC PSEUDOCYST DRAINAGE  07/13/2010  . PORTACATH PLACEMENT  11/28/2008   [facility]  . VIDEO BRONCHOSCOPY WITH ENDOBRONCHIAL ULTRASOUND N/A 07/24/2016   Procedure: VIDEO BRONCHOSCOPY WITH ENDOBRONCHIAL ULTRASOUND;  Surgeon: Grace Isaac, MD;  Location: [facility];  Service: Thoracic;  Laterality: N/A;     reports that he has been smoking Cigarettes.  He has a 5.00 pack-year smoking history. He has never used smokeless tobacco. He reports that he does not drink alcohol or use drugs.  Allergies  Allergen Reactions  . Latex Rash    Family History  Problem Relation Age of Onset  . Alcoholism Father   . Alcoholism Mother     Prior to Admission medications   Medication Sig Start Date End Date Taking? Authorizing Provider  Adhesive Tape (CLOTH ADHESIVE SURG 1/2"X10YD) TAPE 1 each by Does not apply route 2 (two) times daily as needed. 03/09/16  Yes [name], FNP  amitriptyline (ELAVIL) 25 MG tablet TAKE ONE TABLET BY MOUTH EVERY NIGHT AT BEDTIME 10/22/16  Yes [name], FNP  amLODipine (NORVASC) 10 MG tablet Take 0.5 tablets (5 mg total) by mouth daily. 10/04/16  Yes [name], FNP  ciprofloxacin (CIPRO) 500 MG tablet Take 1 tablet (500 mg total) by mouth 2 (two) times daily. 10/24/16 11/03/16 Yes Robbie Lis, MD  CREON [phone] units CPEP TAKE TWO CAPSULES BY MOUTH BEFORE MEALS 08/20/16  Yes [name], FNP  DULoxetine (CYMBALTA) 30 MG capsule Take 1 capsule (30 mg total) by mouth 2 (two) times daily. 07/28/16  Yes [name], FNP  FEROSUL 325 (65 Fe) MG tablet TAKE ONE (1) TABLET BY MOUTH EVERY DAY WITH BREAKFAST 10/22/16  Yes [name], FNP  gabapentin (NEURONTIN) 300 MG  capsule TAKE ONE (1) CAPSULE BY MOUTH 4 TIMES DAILY AS NEEDED Patient taking differently: TAKE 300 mg three times daily 08/20/16  Yes [name], FNP  metroNIDAZOLE (FLAGYL) 500 MG tablet Take 1 tablet (500 mg total) by mouth 3 (three) times daily. 10/24/16 11/03/16 Yes Robbie Lis, MD  mirtazapine (REMERON) 15 MG tablet TAKE ONE TABLET BY MOUTH EVERY EVENING Patient taking differently: TAKE 15 MG TABLET BY MOUTH EVERY EVENING 09/17/16  Yes [name], FNP  nicotine (NICODERM CQ) 14 mg/24hr patch Place 1 patch (14 mg total) onto the skin daily. 08/27/16  Yes [name], FNP  pantoprazole (PROTONIX) 40 MG tablet TAKE ONE (1) TABLET BY MOUTH EVERY DAY Patient taking differently: take 40 mg tablet, by mouth, once daily 08/20/16  Yes [name], FNP  phenytoin (DILANTIN) 300 MG ER capsule Take 1 capsule (300 mg total) by mouth at bedtime. 10/09/16  Yes Cameron Sprang, MD  potassium chloride SA (K-DUR,KLOR-CON) 20 MEQ tablet Take 1 tablet (20 mEq total) by mouth 2 (two) times daily. 10/15/16  Yes Hedges, Dellis Filbert, PA-C  promethazine (PHENERGAN) 25 MG tablet Take 1 tablet (25 mg total)  by mouth every 6 (six) hours as needed for nausea or vomiting. 10/24/16  Yes Robbie Lis, MD  albuterol (PROVENTIL HFA;VENTOLIN HFA) [id number] Base) MCG/ACT inhaler Inhale 2 puffs into the lungs every 6 (six) hours as needed for wheezing or shortness of breath. 09/01/16   Curcio, Roselie Awkward, NP  Gauze Pads & Dressings (BIOGUARD GAUZE SPONGES) 4"X4" PADS 1 each by Does not apply route 2 (two) times daily. 03/09/16   [name], FNP  lidocaine-prilocaine (EMLA) cream Apply 1 application topically as needed. Patient taking differently: Apply 1 application topically as needed (access port).  09/01/16   Maryanna Shape, NP  metoCLOPramide (REGLAN) 10 MG tablet Take 1 tablet (10 mg total) by mouth every 6 (six) hours. Patient not taking: Reported on 10/26/2016 10/25/16   Charlann Lange, PA-C    Nutritional Supplements (FEEDING SUPPLEMENT, VITAL AF 1.2 CAL,) LIQD Place 1,000 mLs into feeding tube daily. At 78ml/hr Patient not taking: Reported on 10/26/2016 05/16/16   Eugenie Filler, MD  ondansetron (ZOFRAN) 4 MG tablet Take 1 tablet (4 mg total) by mouth every 6 (six) hours as needed for nausea. 05/15/16   Eugenie Filler, MD  oxyCODONE (OXY IR/ROXICODONE) 5 MG immediate release tablet Take 1 tablet (5 mg total) by mouth every 6 (six) hours as needed for severe pain. 10/28/16   Maryanna Shape, NP  prochlorperazine (COMPAZINE) 10 MG tablet Take 1 tablet (10 mg total) by mouth every 6 (six) hours as needed for nausea or vomiting. 10/26/16   Curt Bears, MD  ranitidine (ZANTAC) 150 MG capsule TAKE 1 CAPSULE BY MOUTH TWICE DAILY Patient taking differently: take 150 mg capsule, by mouth, twice daily 06/13/15   Micheline Chapman, NP  sucralfate (CARAFATE) 1 GM/10ML suspension Take 10 mLs (1 g total) by mouth 4 (four) times daily -  with meals and at bedtime. 08/25/16   Gery Pray, MD  Water For Irrigation, Sterile (FREE WATER) SOLN Place 200 mLs into feeding tube every 8 (eight) hours. 05/15/16   Eugenie Filler, MD  Wound Dressings (SONAFINE EX) Apply topically.    [provider]    Physical Exam: Vitals:   10/29/16 1305 10/29/16 1548  BP: 117/85 (!) 145/85  Pulse: 93 67  Resp: 20 18  Temp: 98.4 F (36.9 C)   TempSrc: Oral   SpO2: 100% 100%  Weight: 45.8 kg (101 lb)   Height: 5\' 7"  (1.702 m)     Constitutional: NAD, calm, comfortable Vitals:   10/29/16 1305 10/29/16 1548  BP: 117/85 (!) 145/85  Pulse: 93 67  Resp: 20 18  Temp: 98.4 F (36.9 C)   TempSrc: Oral   SpO2: 100% 100%  Weight: 45.8 kg (101 lb)   Height: 5\' 7"  (1.702 m)    Eyes: PERRL, lids and conjunctivae normal; cachectic ENMT: Mucous membranes are moist. Posterior pharynx clear of any exudate or lesions.Normal dentition.  Neck: normal, supple, no masses, no thyromegaly Respiratory:  clear to auscultation bilaterally, no wheezing, no crackles. Normal respiratory effort. No accessory muscle use.  Cardiovascular: Regular rate and rhythm, no murmurs / rubs / gallops. No extremity edema. 2+ pedal pulses. No carotid bruits.  Abdomen: no tenderness, no masses palpated. No hepatosplenomegaly. Bowel sounds positive. PEG tube c/d/i Musculoskeletal: no clubbing / cyanosis. No joint deformity upper and lower extremities. Good ROM, no contractures. Normal muscle tone.  Skin: no rashes, lesions, ulcers. No induration Neurologic: CN 2-12 grossly intact. Sensation intact, DTR normal. Strength 5/5 in  all 4.  Psychiatric: Normal judgment and insight. Alert and oriented x 3. Normal mood.   Labs on Admission: I have personally reviewed following labs and imaging studies  CBC:  Recent Labs Lab 10/24/16 0408 10/25/16 0028 10/27/16 1123 10/29/16 1400 10/29/16 1412  WBC 8.0 7.2 5.5 10.1  --   NEUTROABS  --  5.6 3.7 7.3  --   HGB 7.5* 8.0* 9.9* 10.5* 10.5*  HCT 22.0* 23.1* 29.7* 31.5* 31.0*  MCV 96.1 95.1 97.4 97.8  --   PLT 94* 92* 137* 191  --    Basic Metabolic Panel:  Recent Labs Lab 10/24/16 0408 10/25/16 0028 10/27/16 1123 10/29/16 1400 10/29/16 1412  NA 139 137 141 142 142  K 3.4* 3.2* 3.3* 2.2* 2.2*  CL 107 103  --  86* 85*  CO2 27 29 27  41*  --   GLUCOSE 113* 88 137 141* 140*  BUN 5* <5* <2.0* <5* <3*  CREATININE 0.56* 0.55* 0.8 0.94 1.00  CALCIUM 7.8* 8.0* 8.6 8.6*  --   MG  --   --  1.6  --   --    GFR: Estimated Creatinine Clearance: 53.4 mL/min (by C-G formula based on SCr of 1 mg/dL). Liver Function Tests:  Recent Labs Lab 10/25/16 0028 10/27/16 1123 10/29/16 1400  AST 11* 18 24  ALT 9* 9 11*  ALKPHOS 104 105 117  BILITOT 0.2* 0.29 0.6  PROT 5.5* 6.3* 7.5  ALBUMIN 2.8* 2.9* 3.6    Recent Labs Lab 10/29/16 1400  LIPASE 28   No results for input(s): AMMONIA in the last 168 hours. Coagulation Profile: No results for input(s): INR, PROTIME  in the last 168 hours. Cardiac Enzymes: No results for input(s): CKTOTAL, CKMB, CKMBINDEX, TROPONINI in the last 168 hours. BNP (last 3 results) No results for input(s): PROBNP in the last 8760 hours. HbA1C: No results for input(s): HGBA1C in the last 72 hours. CBG: No results for input(s): GLUCAP in the last 168 hours. Lipid Profile: No results for input(s): CHOL, HDL, LDLCALC, TRIG, CHOLHDL, LDLDIRECT in the last 72 hours. Thyroid Function Tests: No results for input(s): TSH, T4TOTAL, FREET4, T3FREE, THYROIDAB in the last 72 hours. Anemia Panel: No results for input(s): VITAMINB12, FOLATE, FERRITIN, TIBC, IRON, RETICCTPCT in the last 72 hours. Urine analysis:    Component Value Date/Time   COLORURINE YELLOW 10/21/2016 0146   APPEARANCEUR CLEAR 10/21/2016 0146   LABSPEC 1.010 10/21/2016 0146   PHURINE 6.0 10/21/2016 0146   GLUCOSEU NEGATIVE 10/21/2016 0146   HGBUR NEGATIVE 10/21/2016 0146   BILIRUBINUR NEGATIVE 10/21/2016 0146   KETONESUR NEGATIVE 10/21/2016 0146   PROTEINUR NEGATIVE 10/21/2016 0146   UROBILINOGEN 2.0 (H) 06/02/2016 1126   NITRITE NEGATIVE 10/21/2016 0146   LEUKOCYTESUR NEGATIVE 10/21/2016 0146    Radiological Exams on Admission: No results found.   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 Labs -PT eval  Mild hematemesis with recent EGD demonstrating esophageal ulcers -monitor H&H every 6 hours -Protonix IV twice a day -Clear liquid diet -Continue on IVF -Transfuse as needed -Orthostatic vitals  Diarrhea -Stool studies -Possibly secondary to chronic pancreatitis, consider Creon prior to DC   DVT prophylaxis: SCDs Code Status:  Full Family Communication: Alone in  room; lives alone Disposition Plan: In AM if potassium resolved Consults called: None Admission status: OBS   Abdalla Naramore D [facility] Pager [phone]  If 7PM-7AM, please contact night-coverage [email or web address] Password [email or web address]  10/29/2016, 4:28 PM

## 2016-10-29 NOTE — Assessment & Plan Note (Signed)
Potassium level today is 3.3. He will receive 20 mEq of IV potassium here in our office along with a liter of normal saline due to dehydration.

## 2016-10-29 NOTE — Progress Notes (Signed)
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 patient was advised to call immediately if he has any concerning symptoms in the interval. Disclaimer: This note was dictated with voice recognition software. Similar sounding words can inadvertently be transcribed and may be missed upon review. Eilleen Kempf, MD ,10/31/16

## 2016-10-29 NOTE — ED Triage Notes (Signed)
Per EMS, pt is coming from home with the complaints of vomiting up blood and feeling dizzy all night. Pt had a chemo treatment yesterday that "did not go well". Pt was advised to come to the ED yesterday but pt refused. Pt reports having diarrhea with no blood present. Pt is AO x4 and ambulates independently.

## 2016-10-29 NOTE — ED Notes (Signed)
Bed: RESA Expected date:  Expected time:  Means of arrival:  Comments: EMS- [place] Pt, hematemesis

## 2016-10-29 NOTE — ED Notes (Signed)
Date and time results received: 10/29/16 3:23 PM  Test: Potassium Critical Value: 2.2  Name of Provider Notified: A. Law PA  Orders Received? Or Actions Taken?: Awaiting further orders

## 2016-10-29 NOTE — Assessment & Plan Note (Addendum)
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 The patient is currently undergoing systemic chemotherapy with cisplatin and etoposide status post 3 cycles and has been tolerating this treatment fairly well. This is concurrent with radiation. CT scan after 2 cycles showed improvement of his disease.  The patient was seen with Dr. Julien Nordmann. He remains weak and is not feeling well today. We will hold off on proceeding with chemotherapy as scheduled today. We will have him return next week for reevaluation and consideration of cycle 4 of his chemotherapy.  Recommend that he uses antiemetics that he has at home. He was also encouraged to use Imodium for diarrhea. The patient will follow up with gastroenterology for results of his upper endoscopy.  For COPD, he will continue his treatment with the inhaler. For pain management the patient is currently on oxycodone. Refill was given today.  The patient was advised to call immediately if he has any concerning symptoms in the interval. The patient voices understanding of current disease status and treatment options and is in agreement with the current care plan. All questions were answered. The patient knows to call the clinic with any problems, questions or concerns. We can certainly see the patient much sooner if necessary.

## 2016-10-29 NOTE — ED Provider Notes (Signed)
[facility] DEPT Provider Note   CSN: [id number] Arrival date & time: 10/29/16  1253     History   Chief Complaint Chief Complaint  Patient presents with  . Hematemesis    HPI Marcus Brooks is a 56 y.o. male with a history of lung cancer on chemotherapy and history of radiation therapy, chronic abdominal pain, chronic pancreatitis, anemia who presents with ongoing abdominal pain and new onset hematemesis that began last night.  Patient has also had associated dizziness since he started vomiting blood last night.  He reports vomiting with around Penny-sized clots in the vomit throughout the night. It was not straight blood.  He reports epigastric and left upper quadrant pain.  He denies chest pain or shortness of breath.  He denies any urinary symptoms.  He was supposed to have chemotherapy yesterday, however they did not do infusion because patient was dehydrated and hypokalemic.  Instead, he was given 2 g of potassium and sent home.  HPI  Past Medical History:  Diagnosis Date  . Anxiety states 08/04/2010  . Chronic airway obstruction, not elsewhere classified 08/04/2010  . Chronic pain syndrome 08/04/2010  . Chronic pancreatitis ([place]) 08/04/2010  . Coronary artery disease   . GERD (gastroesophageal reflux disease)   . Hiatal hernia    EGD 01/18/13  . History of radiation therapy 08/10/16-09/29/16   left lung 2 Gy in 30 fractions  . Insomnia 08/04/2010  . Intellectual disability   . Lung nodule 05/20/2016  . Other abnormal glucose 08/04/2010  . Pancreatitis chronic 08/04/2010  . Protein calorie malnutrition ([place]) 2010  . Seizure ([place]) 08/04/2010  . Severe anemia 05/20/2016  . Small cell carcinoma of upper lobe of left lung ([place]) 07/30/2016  . Splenic vein thrombosis    Chronic.  . Tobacco use disorder 08/04/2010  . Unspecified essential hypertension 08/04/2010    Patient Active Problem List   Diagnosis Date Noted  . Abdominal pain 10/21/2016  .  Hypokalemia 10/20/2016  . Diarrhea 10/20/2016  . Localization-related (focal) (partial) symptomatic epilepsy and epileptic syndromes with complex partial seizures, not intractable, without status epilepticus ([place]) 10/09/2016  . History of stroke 10/09/2016  . Dehydration 09/24/2016  . Small cell carcinoma of upper lobe of left lung ([place]) 07/30/2016  . Encounter for antineoplastic chemotherapy 07/30/2016  . Goals of care, counseling/discussion 07/30/2016  . Lung nodule 05/20/2016  . Anemia 05/20/2016  . Pancreatic lesion   . Malnutrition ([place])   . Phlegmon of pancreas 05/09/2016  . Acute on chronic pancreatitis ([place]) 05/09/2016  . Lymphadenopathy 05/09/2016  . Hx of colonic polyps 04/23/2016  . Atherosclerosis of aorta ([place]) 04/09/2016  . History of colonic polyps 02/19/2016  . Dizziness 06/10/2015  . Pancreatic pseudocyst 03/13/2013  . Cellulitis 01/24/2013  . Gastritis 01/24/2013  . Pancreatitis 01/23/2013  . Irritation around percutaneous endoscopic gastrostomy (PEG) tube site ([place]) 01/23/2013  . Hyponatremia 01/19/2013  . Hiatal hernia 01/19/2013  . Acute blood loss anemia 01/18/2013  . 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 Coronary artery disease   . Upper GI bleed 01/17/2013  . Chronic pancreatitis ([place]) 01/17/2013  . Protein-calorie malnutrition, severe ([place]) 01/10/2013  . Hematemesis 01/09/2013  . Pancreatitis, acute 01/09/2013  . Seizure disorder ([place]) 01/09/2013  . Syncope 01/09/2013  . Acute pancreatitis 10/03/2012  . Leukocytosis 10/03/2012  . Transaminitis 10/03/2012  . Chest pain 10/03/2012  . Pain around PEG tube site 03/09/2011  . Hypertension 11/05/2010  . GERD (gastroesophageal reflux disease) 11/05/2010  . Chronic  pain syndrome 08/04/2010  . Tobacco use disorder 08/04/2010    Past Surgical History:  Procedure Laterality Date  . COLONOSCOPY WITH PROPOFOL N/A 06/26/2016   Procedure: COLONOSCOPY WITH PROPOFOL;  Surgeon: Rogene Houston, MD;  [facility];  Service: Endoscopy;  Laterality: N/A;  10:30  . CORONARY ANGIOPLASTY WITH STENT PLACEMENT    . ESOPHAGOGASTRODUODENOSCOPY N/A 01/18/2013   Procedure: ESOPHAGOGASTRODUODENOSCOPY (EGD);  Surgeon: Rogene Houston, MD;  [facility];  Service: Endoscopy;  Laterality: N/A;  . ESOPHAGOGASTRODUODENOSCOPY N/A 10/23/2016   Procedure: ESOPHAGOGASTRODUODENOSCOPY (EGD);  Surgeon: Ronnette Juniper, MD;  Location: [facility];  Service: Gastroenterology;  Laterality: N/A;  . EUS  03/26/2009   [facility]  . GASTROSTOMY-JEJEUNOSTOMY TUBE CHANGE/PLACEMENT  12/11/2008   [facility]  . I&D EXTREMITY Left 09/12/2012   Procedure: IRRIGATION AND DEBRIDEMENT LEFT FIFTH FINGER;  Surgeon: Roseanne Kaufman, MD;  Location: [facility];  Service: Orthopedics;  Laterality: Left;  . IR FLUORO GUIDE PORT INSERTION RIGHT  08/31/2016  . IR US GUIDE VASC ACCESS RIGHT  08/31/2016  . JEJUNOSTOMY FEEDING TUBE  07/13/2010  . NERVE AND TENDON REPAIR Left 09/12/2012   Procedure: NERVE AND TENDON REPAIR LEFT FIFTH FINGER;  Surgeon: Roseanne Kaufman, MD;  Location: [facility];  Service: Orthopedics;  Laterality: Left;  . OPEN REDUCTION INTERNAL FIXATION (ORIF) METACARPAL Left 09/12/2012   Procedure: OPEN REDUCTION INTERNAL FIXATION LEFT FIFTH FINGER;  Surgeon: Roseanne Kaufman, MD;  Location: [facility];  Service: Orthopedics;  Laterality: Left;  . PANCREATIC PSEUDOCYST DRAINAGE  07/13/2010  . PORTACATH PLACEMENT  11/28/2008   [facility]  . VIDEO BRONCHOSCOPY WITH ENDOBRONCHIAL ULTRASOUND N/A 07/24/2016   Procedure: VIDEO BRONCHOSCOPY WITH ENDOBRONCHIAL ULTRASOUND;  Surgeon: Grace Isaac, MD;  Location: [facility];  Service: Thoracic;  Laterality: N/A;       Home Medications    Prior to Admission medications   Medication Sig Start Date End Date Taking? Authorizing Provider  Adhesive Tape (CLOTH ADHESIVE SURG 1/2"X10YD) TAPE 1 each by Does not apply route 2 (two) times daily as needed. 03/09/16  Yes [name], FNP    amitriptyline (ELAVIL) 25 MG tablet TAKE ONE TABLET BY MOUTH EVERY NIGHT AT BEDTIME 10/22/16  Yes [name], FNP  CREON [phone] units CPEP TAKE TWO CAPSULES BY MOUTH BEFORE MEALS 08/20/16  Yes [name], FNP  DULoxetine (CYMBALTA) 30 MG capsule Take 1 capsule (30 mg total) by mouth 2 (two) times daily. 07/28/16  Yes [name], FNP  FEROSUL 325 (65 Fe) MG tablet TAKE ONE (1) TABLET BY MOUTH EVERY DAY WITH BREAKFAST 10/22/16  Yes [name], FNP  gabapentin (NEURONTIN) 300 MG capsule TAKE ONE (1) CAPSULE BY MOUTH 4 TIMES DAILY AS NEEDED Patient taking differently: TAKE 300 mg three times daily 08/20/16  Yes [name], FNP  Gauze Pads & Dressings (BIOGUARD GAUZE SPONGES) 4"X4" PADS 1 each by Does not apply route 2 (two) times daily. 03/09/16  Yes [name], FNP  nicotine (NICODERM CQ) 14 mg/24hr patch Place 1 patch (14 mg total) onto the skin daily. 08/27/16  Yes [name], FNP  ondansetron (ZOFRAN) 4 MG tablet Take 1 tablet (4 mg total) by mouth every 6 (six) hours as needed for nausea. 05/15/16  Yes Eugenie Filler, MD  oxyCODONE (OXY IR/ROXICODONE) 5 MG immediate release tablet Take 1 tablet (5 mg total) by mouth every 6 (six) hours as needed for severe pain. 10/28/16  Yes Maryanna Shape, NP  pantoprazole (PROTONIX) 40 MG tablet TAKE ONE (1) TABLET BY MOUTH EVERY DAY Patient taking differently: take 40 mg tablet, by mouth, once daily 08/20/16  Yes [name], FNP  phenytoin (DILANTIN) 300 MG ER capsule Take 1 capsule (300 mg total) by mouth at bedtime. 10/09/16  Yes Cameron Sprang, MD  prochlorperazine (COMPAZINE) 10 MG tablet Take 1 tablet (10 mg total) by mouth every 6 (six) hours as needed for nausea or vomiting. 10/26/16  Yes Curt Bears, MD  promethazine (PHENERGAN) 25 MG tablet Take 1 tablet (25 mg total) by mouth every 6 (six) hours as needed for nausea or vomiting. 10/24/16  Yes Robbie Lis, MD  ranitidine (ZANTAC) 150 MG  capsule TAKE 1 CAPSULE BY MOUTH TWICE DAILY Patient taking differently: take 150 mg capsule, by mouth, twice daily 06/13/15  Yes Micheline Chapman, NP  sucralfate (CARAFATE) 1 GM/10ML suspension Take 10 mLs (1 g total) by mouth 4 (four) times daily -  with meals and at bedtime. 08/25/16  Yes Gery Pray, MD  Water For Irrigation, Sterile (FREE WATER) SOLN Place 200 mLs into feeding tube every 8 (eight) hours. 05/15/16  Yes Eugenie Filler, MD  Wound Dressings (SONAFINE EX) Apply topically.   Yes [provider]  metoCLOPramide (REGLAN) 10 MG tablet Take 1 tablet (10 mg total) by mouth every 6 (six) hours. Patient not taking: Reported on 10/26/2016 10/25/16   Charlann Lange, PA-C  Nutritional Supplements (FEEDING SUPPLEMENT, VITAL AF 1.2 CAL,) LIQD Place 1,000 mLs into feeding tube daily. At 66ml/hr Patient not taking: Reported on 10/29/2016 05/16/16   Eugenie Filler, MD    Family History Family History  Problem Relation Age of Onset  . Alcoholism Father   . Alcoholism Mother     Social History Social History  Substance Use Topics  . Smoking status: Current Every Day Smoker    Packs/day: 0.25    Years: 20.00    Types: Cigarettes  . Smokeless tobacco: Never Used     Comment: 1 pack a day since age 46  . Alcohol use No     Comment: No etoh since 5-6 yrs     Allergies   Latex   Review of Systems Review of Systems  Constitutional: Negative for chills and fever.  HENT: Negative for facial swelling and sore throat.   Respiratory: Negative for shortness of breath.   Cardiovascular: Negative for chest pain.  Gastrointestinal: Positive for abdominal pain, diarrhea, nausea and vomiting. Negative for blood in stool.  Genitourinary: Negative for dysuria.  Musculoskeletal: Negative for back pain.  Skin: Negative for rash and wound.  Neurological: Negative for headaches.  Psychiatric/Behavioral: The patient is not nervous/anxious.      Physical Exam Updated Vital  Signs BP (!) 145/85   Pulse 67   Temp 98.4 F (36.9 C) (Oral)   Resp 18   Ht 5\' 7"  (1.702 m)   Wt 45.8 kg (101 lb)   SpO2 100%   BMI 15.82 kg/m   Physical Exam  Constitutional: He appears well-developed and well-nourished. No distress.  Cachectic, chronically ill appearing  HENT:  Head: Normocephalic and atraumatic.  Mouth/Throat: Oropharynx is clear and moist. No oropharyngeal exudate.  Eyes: Pupils are equal, round, and reactive to light. Conjunctivae are normal. Right eye exhibits no discharge. Left eye exhibits no discharge. No scleral icterus.  Neck: Normal range of motion. Neck supple. No thyromegaly present.  Cardiovascular: Normal rate, regular rhythm, normal heart sounds and intact distal pulses.  Exam reveals no gallop and no friction rub.   No murmur heard. Pulmonary/Chest: Effort normal and breath sounds normal. No stridor. No respiratory distress. He has no wheezes. He has no rales.  Abdominal: Soft. Bowel sounds are normal. He exhibits no distension. There is generalized tenderness and tenderness in the right upper quadrant, epigastric area and left upper quadrant. There is no rebound and no guarding.  Musculoskeletal: He exhibits no edema.  Lymphadenopathy:    He has no cervical adenopathy.  Neurological: He is alert. Coordination normal.  Skin: Skin is warm and dry. No rash noted. He is not diaphoretic. No pallor.  Psychiatric: He has a normal mood and affect.  Nursing note and vitals reviewed.    ED Treatments / Results  Labs (all labs ordered are listed, but only abnormal results are displayed) Labs Reviewed  COMPREHENSIVE METABOLIC PANEL - Abnormal; Notable for the following:       Result Value   Potassium 2.2 (*)    Chloride 86 (*)    CO2 41 (*)    Glucose, Bld 141 (*)    BUN <5 (*)    Calcium 8.6 (*)    ALT 11 (*)    All other components within normal limits  CBC WITH DIFFERENTIAL/PLATELET - Abnormal; Notable for the following:    RBC 3.22 (*)     Hemoglobin 10.5 (*)    HCT 31.5 (*)    Monocytes Absolute 1.2 (*)    All other components within normal limits  I-STAT CHEM 8, ED - Abnormal; Notable for the following:    Potassium 2.2 (*)    Chloride 85 (*)    BUN <3 (*)    Glucose, Bld 140 (*)    Calcium, Ion 0.96 (*)    TCO2 44 (*)    Hemoglobin 10.5 (*)    HCT 31.0 (*)    All other components within normal limits  GASTROINTESTINAL PANEL BY PCR, STOOL (REPLACES STOOL CULTURE)  LIPASE, BLOOD  MAGNESIUM  HEMOGLOBIN AND HEMATOCRIT, BLOOD  HEMOGLOBIN AND HEMATOCRIT, BLOOD  HEMOGLOBIN AND HEMATOCRIT, BLOOD  I-STAT TROPONIN, ED  TYPE AND SCREEN    EKG  EKG Interpretation  Date/Time:  Thursday October 29 2016 13:13:51 EDT Ventricular Rate:  66 PR Interval:    QRS Duration: 104 QT Interval:  424 QTC Calculation: 445 R Axis:   94 Text Interpretation:  Sinus rhythm Atrial premature complex Consider left atrial enlargement Borderline right axis deviation RSR' in V1 or V2, probably normal variant Repol abnrm suggests ischemia, inferior leads Since prior ECG, more exaggerated ST cepression ni inferior leads. Similar morphology anterior leads Confirmed by Gareth Morgan [phone]) on 10/29/2016 1:38:57 PM       Radiology No results found.  Procedures Procedures (including critical care time)  Medications Ordered in ED Medications  potassium chloride 10 mEq in 100 mL IVPB (0 mEq Intravenous Stopped 10/29/16 1710)  potassium chloride 10 mEq in 100 mL IVPB (not administered)  pantoprazole (PROTONIX) injection 40 mg (not administered)  sodium chloride 0.9 % bolus 1,000 mL (0 mLs Intravenous Stopped 10/29/16 1705)  metoCLOPramide (REGLAN) injection 10 mg (10 mg Intravenous Given 10/29/16 1403)  morphine 4 MG/ML injection 4 mg (4 mg Intravenous Given 10/29/16 1403)  pantoprazole (PROTONIX) injection 40 mg (40 mg Intravenous Given 10/29/16 1455)  HYDROmorphone (DILAUDID) injection 0.5 mg (0.5 mg Intravenous Given 10/29/16 1551)    sucralfate (CARAFATE) 1 GM/10ML suspension 1 g (1 g Oral Given 10/29/16 1551)     Initial Impression /  Assessment and Plan / ED Course  I have reviewed the triage vital signs and the nursing notes.  Pertinent labs & imaging results that were available during my care of the patient were reviewed by me and considered in my medical decision making (see chart for details).     Patient with hematemesis and dizziness.  Potassium is 2.2.  Repletion initiated in the ED.  Hemoglobin is stable from past, but type and screen pending.  Hemoglobin is 10.5.  Patient having continued epigastric and left upper quadrant pain.  Patient denies any changes in this pain, however requesting pain medication.  Nausea and vomiting controlled with Reglan in the ED.  Protonix, Carafate, fluids also given.  I consulted [facility] and spoke with Dr. Manuella Ghazi who will admit the patient for further evaluation and treatment.  Final Clinical Impressions(s) / ED Diagnoses   Final diagnoses:  Hematemesis with nausea  Hypokalemia  Epigastric pain    New Prescriptions New Prescriptions   No medications on file     Caryl Ada 10/29/16 [place], Erin, MD 10/29/16 2112

## 2016-10-30 ENCOUNTER — Ambulatory Visit: Payer: Medicaid Other

## 2016-10-30 DIAGNOSIS — E876 Hypokalemia: Secondary | ICD-10-CM | POA: Diagnosis not present

## 2016-10-30 LAB — COMPREHENSIVE METABOLIC PANEL
ALT: 10 U/L — ABNORMAL LOW (ref 17–63)
AST: 15 U/L (ref 15–41)
Albumin: 2.8 g/dL — ABNORMAL LOW (ref 3.5–5.0)
Alkaline Phosphatase: 92 U/L (ref 38–126)
Anion gap: 6 (ref 5–15)
BUN: <5 mg/dL — ABNORMAL LOW (ref 6–20)
CO2: 35 mmol/L — ABNORMAL HIGH (ref 22–32)
Calcium: 8.1 mg/dL — ABNORMAL LOW (ref 8.9–10.3)
Chloride: 95 mmol/L — ABNORMAL LOW (ref 101–111)
Creatinine, Ser: 0.94 mg/dL (ref 0.61–1.24)
GFR calc Af Amer: >60 mL/min (ref >60)
GFR calc non Af Amer: >60 mL/min (ref >60)
Glucose, Bld: 69 mg/dL (ref 65–99)
Potassium: 3.5 mmol/L (ref 3.5–5.1)
Sodium: 136 mmol/L (ref 135–145)
Total Bilirubin: 0.4 mg/dL (ref 0.3–1.2)
Total Protein: 5.8 g/dL — ABNORMAL LOW (ref 6.5–8.1)

## 2016-10-30 LAB — CBC
HCT: 24 % — ABNORMAL LOW (ref 39.0–52.0)
Hemoglobin: 7.9 g/dL — ABNORMAL LOW (ref 13.0–17.0)
MCH: 31.6 pg (ref 26.0–34.0)
MCHC: 32.9 g/dL (ref 30.0–36.0)
MCV: 96 fL (ref 78.0–100.0)
PLATELETS: 123 10*3/uL — AB (ref 150–400)
RBC: 2.5 MIL/uL — ABNORMAL LOW (ref 4.22–5.81)
RDW: 14.6 % (ref 11.5–15.5)
WBC: 5.3 10*3/uL (ref 4.0–10.5)

## 2016-10-30 LAB — MAGNESIUM: Magnesium: 1.4 mg/dL — ABNORMAL LOW (ref 1.7–2.4)

## 2016-10-30 LAB — PREPARE RBC (CROSSMATCH)

## 2016-10-30 LAB — HEMOGLOBIN AND HEMATOCRIT, BLOOD
HCT: 23.2 % — ABNORMAL LOW (ref 39.0–52.0)
Hemoglobin: 7.8 g/dL — ABNORMAL LOW (ref 13.0–17.0)

## 2016-10-30 MED ORDER — SODIUM CHLORIDE 0.9 % IV SOLN
Freq: Once | INTRAVENOUS | Status: AC
  Administered 2016-10-30: 17:00:00 via INTRAVENOUS

## 2016-10-30 MED ORDER — SODIUM CHLORIDE 0.9% FLUSH
10.0000 mL | INTRAVENOUS | Status: DC | PRN
  Administered 2016-10-31: 10 mL
  Filled 2016-10-30: qty 40

## 2016-10-30 MED ORDER — POTASSIUM CHLORIDE CRYS ER 20 MEQ PO TBCR
40.0000 meq | EXTENDED_RELEASE_TABLET | Freq: Once | ORAL | Status: AC
  Administered 2016-10-30: 40 meq via ORAL
  Filled 2016-10-30: qty 2

## 2016-10-30 MED ORDER — BOOST / RESOURCE BREEZE PO LIQD
1.0000 | Freq: Three times a day (TID) | ORAL | Status: DC
  Administered 2016-10-30 – 2016-10-31 (×3): 1 via ORAL

## 2016-10-30 NOTE — Evaluation (Signed)
Physical Therapy Evaluation Patient Details Name: Marcus Brooks MRN: [id number] DOB: Dec 02, 1960 Today's Date: 10/30/2016   History of Present Illness  56 yo male admitted with severe hypokalemia, weakness, hematemesis. Hx of chronic pancreatitis, lung malignancy, chronic pain, sz d/o.   Clinical Impression  On eval, pt required Min guard-Min assist for mobility. He walked ~150 feet without an assistive device. Pt is unsteady and at risk for falls without support. Pt presents with general weakness, decreased activity tolerance, and impaired gait and balance. Recommended to pt that he use his RW for safe ambulation, if/when possible. Pt stated he has limited space in his apt which can make RW use difficult. He also has access to a cane if use of RW is not possible. Pt stated he did not feel ready to d/c home yet. Discussed possibly discharging to a SNF-he politely declines placement. He prefers to return home.     Follow Up Recommendations Home Safety Evaluation by RN/PT if possible; Intermittent supervision/assist; Supervision for mobility/OOB (initially until pt returns to baseline)    Equipment Recommendations  None recommended by PT    Recommendations for Other Services       Precautions / Restrictions Precautions Precautions: Fall Restrictions Weight Bearing Restrictions: No      Mobility  Bed Mobility Overal bed mobility: Modified Independent                Transfers Overall transfer level: Needs assistance   Transfers: Sit to/from Stand Sit to Stand: Supervision         General transfer comment: for safety  Ambulation/Gait Ambulation/Gait assistance: Min assist Ambulation Distance (Feet): 150 Feet Assistive device: None Gait Pattern/deviations: Step-through pattern;Decreased stride length;Staggering left;Staggering right;Drifts right/left;Narrow base of support     General Gait Details: Intermittent assist to stabilize due to unsteadiness.   Stairs             Wheelchair Mobility    Modified Rankin (Stroke Patients Only)       Balance Overall balance assessment: Needs assistance           Standing balance-Leahy Scale: Fair                               Pertinent Vitals/Pain Pain Assessment: Faces Faces Pain Scale: Hurts even more Pain Location: LEs Pain Descriptors / Indicators: Sore Pain Intervention(s): Monitored during session    Home Living Family/patient expects to be discharged to:: Private residence Living Arrangements: Alone Available Help at Discharge: Family;Available PRN/intermittently Type of Home: House Home Access: Level entry     Home Layout: Multi-level Home Equipment: Walker - 2 wheels;Cane - single point      Prior Function Level of Independence: Independent               Hand Dominance        Extremity/Trunk Assessment   Upper Extremity Assessment Upper Extremity Assessment: Overall WFL for tasks assessed    Lower Extremity Assessment Lower Extremity Assessment: Generalized weakness    Cervical / Trunk Assessment Cervical / Trunk Assessment: Normal  Communication   Communication: No difficulties  Cognition Arousal/Alertness: Awake/alert Behavior During Therapy: WFL for tasks assessed/performed Overall Cognitive Status: Within Functional Limits for tasks assessed  General Comments      Exercises     Assessment/Plan    PT Assessment Patient needs continued PT services  PT Problem List Decreased strength;Decreased mobility;Decreased balance       PT Treatment Interventions DME instruction;Gait training;Therapeutic activities;Therapeutic exercise;Patient/family education;Functional mobility training;Balance training;Stair training    PT Goals (Current goals can be found in the Care Plan section)  Acute Rehab PT Goals Patient Stated Goal: home. feel better PT Goal Formulation: With  patient Time For Goal Achievement: 11/13/16 Potential to Achieve Goals: Good    Frequency Min 3X/week   Barriers to discharge        Co-evaluation               AM-PAC PT "6 Clicks" Daily Activity  Outcome Measure Difficulty turning over in bed (including adjusting bedclothes, sheets and blankets)?: None Difficulty moving from lying on back to sitting on the side of the bed? : None Difficulty sitting down on and standing up from a chair with arms (e.g., wheelchair, bedside commode, etc,.)?: None Help needed moving to and from a bed to chair (including a wheelchair)?: A Little Help needed walking in hospital room?: A Little Help needed climbing 3-5 steps with a railing? : A Little 6 Click Score: 21    End of Session Equipment Utilized During Treatment: Gait belt Activity Tolerance: Patient tolerated treatment well Patient left: in bed;with call bell/phone within reach   PT Visit Diagnosis: Muscle weakness (generalized) (M62.81);Difficulty in walking, not elsewhere classified (R26.2)    Time: 1607-3710 PT Time Calculation (min) (ACUTE ONLY): 22 min   Charges:   PT Evaluation $PT Eval Low Complexity: 1 Low     PT G Codes:   PT G-Codes **NOT FOR INPATIENT CLASS** Functional Assessment Tool Used: AM-PAC 6 Clicks Basic Mobility;Clinical judgement Functional Limitation: Mobility: Walking and moving around Mobility: Walking and Moving Around Current Status (G2694): At least 20 percent but less than 40 percent impaired, limited or restricted Mobility: Walking and Moving Around Goal Status [phone]): At least 1 percent but less than 20 percent impaired, limited or restricted     Weston Anna, MPT Pager: [phone]

## 2016-10-30 NOTE — Progress Notes (Signed)
PROGRESS NOTE    Marcus Brooks  VBT:[id number] DOB: [birth year] DOA: 10/29/2016 PCP: [name], FNP    Brief Narrative:   Marcus Brooks is a 56 y.o. male with medical history significant of small cell lung cancer, chronic pancreatitis, severe protein calorie malnutrition, and esophageal ulcers with hematemesiswho was recently admitted to this facility on 10/20/2016 for diarrhea as well as nausea and vomiting. He was seen by gastroenterology at that time with endoscopy on 10/23/2016 demonstrating esophageal ulcerations as well as a pyloric mass that was biopsied at that time. The ulcers were noted to be nonbleeding at that time. He did leave AMA with his last hospitalization and returns today with ongoing diarrhea and now lower extremity weakness. He states that the weakness began earlier this morning and it prompted him to call EMS to bring him to the emergency department for evaluation. Additionally, the patient notes that he has had 15 episodes of nausea and vomiting overnight with some blood clots noted in his emesis. He denies any significant bloody output. He has ongoing, dull, achy epigastric abdominal pain that is fairly chronic for him; this does not appear to have changed recently.  His potassium levels have improved with repletion and he continues to have ongoing epigastric abdominal pain which is not unusual for him. He is tolerating a diet. Unfortunately, his H&H appears to have dropped with no further overt bleeding noted and this was confirmed on repeat. This has stabilized, however I will transfuse him 1 unit PRBC prior to discharge in a.m.  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 Labs -PT eval  Mild hematemesis with recent EGD demonstrating esophageal ulcers -Protonix IV twice a day -Clear liquid diet now advanced to fulls -DC IVF  Acute blood loss anemia related to above -Transfuse 1U PRBC today -Repeat AM labs  Diarrhea-resolved -Stool studies pending -Possibly secondary to chronic pancreatitis, consider Creon prior to DC   DVT prophylaxis: SCDs Code Status: Full Family Communication: Alone in room; lives alone Disposition Plan: In AM if potassium and h/h better Consults called: None Admission status: OBS  Consultants:   None   Procedures: None   Antimicrobials: None  Objective: Vitals:   10/29/16 2149 10/30/16 0520 10/30/16 1745 10/30/16 1800  BP: 136/64 (!) 91/54 (!) 102/57 (!) 98/56  Pulse: 64 71 (!) 57 (!) 57  Resp: 16 16 14 12   Temp: (!) 97.5 F (36.4 C) 97.8 F (36.6 C) 98 F (36.7 C) 98.1 F (36.7 C)  TempSrc: Oral Oral Oral Oral  SpO2: 100% 99% 99% 97%  Weight:      Height:        Intake/Output Summary (Last 24 hours) at 10/30/16 1810 Last data filed at 10/30/16 1745  Gross per 24 hour  Intake          1731.67 ml  Output              100 ml  Net          1631.67 ml   Filed Weights   10/29/16 1305  Weight: 45.8 kg (101 lb)    Examination:  General exam: Appears calm and comfortable  Respiratory system: Clear to  auscultation. Respiratory effort normal. Cardiovascular system: S1 & S2 heard, RRR. No JVD, murmurs, rubs, gallops or clicks. No pedal edema. Gastrointestinal system: Abdomen is nondistended, soft and nontender. No organomegaly or masses felt. Normal bowel sounds heard. Central nervous system: Alert and oriented. No focal neurological deficits. Extremities: Symmetric 5 x 5 power. Skin: No rashes, lesions or ulcers Psychiatry: Judgement and insight appear normal. Mood & affect appropriate.     Data Reviewed: I have personally reviewed following labs and imaging  studies  CBC:  Recent Labs Lab 10/24/16 0408 10/25/16 0028 10/27/16 1123 10/29/16 1400 10/29/16 1412 10/30/16 0844 10/30/16 1521  WBC 8.0 7.2 5.5 10.1  --  5.3  --   NEUTROABS  --  5.6 3.7 7.3  --   --   --   HGB 7.5* 8.0* 9.9* 10.5* 10.5* 7.9* 7.8*  HCT 22.0* 23.1* 29.7* 31.5* 31.0* 24.0* 23.2*  MCV 96.1 95.1 97.4 97.8  --  96.0  --   PLT 94* 92* 137* 191  --  123*  --    Basic Metabolic Panel:  Recent Labs Lab 10/24/16 0408 10/25/16 0028 10/27/16 1123 10/29/16 1400 10/29/16 1412 10/30/16 0844  NA 139 137 141 142 142 136  K 3.4* 3.2* 3.3* 2.2* 2.2* 3.5  CL 107 103  --  86* 85* 95*  CO2 27 29 27  41*  --  35*  GLUCOSE 113* 88 137 141* 140* 69  BUN 5* <5* <2.0* <5* <3* <5*  CREATININE 0.56* 0.55* 0.8 0.94 1.00 0.94  CALCIUM 7.8* 8.0* 8.6 8.6*  --  8.1*  MG  --   --  1.6  --   --  1.4*   GFR: Estimated Creatinine Clearance: 56.8 mL/min (by C-G formula based on SCr of 0.94 mg/dL). Liver Function Tests:  Recent Labs Lab 10/25/16 0028 10/27/16 1123 10/29/16 1400 10/30/16 0844  AST 11* 18 24 15   ALT 9* 9 11* 10*  ALKPHOS 104 105 117 92  BILITOT 0.2* 0.29 0.6 0.4  PROT 5.5* 6.3* 7.5 5.8*  ALBUMIN 2.8* 2.9* 3.6 2.8*    Recent Labs Lab 10/29/16 1400  LIPASE 28   No results for input(s): AMMONIA in the last 168 hours. Coagulation Profile: No results for input(s): INR, PROTIME in the last 168 hours. Cardiac Enzymes: No results for input(s): CKTOTAL, CKMB, CKMBINDEX, TROPONINI in the last 168 hours. BNP (last 3 results) No results for input(s): PROBNP in the last 8760 hours. HbA1C: No results for input(s): HGBA1C in the last 72 hours. CBG: No results for input(s): GLUCAP in the last 168 hours. Lipid Profile: No results for input(s): CHOL, HDL, LDLCALC, TRIG, CHOLHDL, LDLDIRECT in the last 72 hours. Thyroid Function Tests: No results for input(s): TSH, T4TOTAL, FREET4, T3FREE, THYROIDAB in the last 72 hours. Anemia Panel: No results for input(s):  VITAMINB12, FOLATE, FERRITIN, TIBC, IRON, RETICCTPCT in the last 72 hours. Sepsis Labs: No results for input(s): PROCALCITON, LATICACIDVEN in the last 168 hours.  Recent Results (from the past 240 hour(s))  MRSA PCR Screening     Status: Abnormal   Collection Time: 10/21/16  8:07 AM  Result Value Ref Range Status   MRSA by PCR POSITIVE (A) NEGATIVE Final    Comment:        The GeneXpert MRSA Assay (FDA approved for NASAL specimens only), is one component of a comprehensive MRSA colonization surveillance program. It is not intended to diagnose MRSA infection nor to guide or monitor treatment for MRSA infections. RESULT CALLED TO, READ  BACK BY AND VERIFIED WITH: FLOYD,D @ 0349 ON 101018 BY POTEAT,S      Radiology Studies: No results found.    Scheduled Meds: . amitriptyline  25 mg Oral QHS  . DULoxetine  30 mg Oral BID  . feeding supplement  1 Container Oral TID BM  . gabapentin  300 mg Oral TID  . lipase/protease/amylase  12,000 Units Oral TID AC  . metoCLOPramide  10 mg Oral Q6H  . nicotine  14 mg Transdermal Daily  . pantoprazole (PROTONIX) IV  40 mg Intravenous Q12H  . phenytoin  300 mg Oral QHS  . sucralfate  1 g Oral TID WC & HS   Continuous Infusions: . lactated ringers Stopped (10/30/16 1722)  . potassium chloride Stopped (10/30/16 0259)     LOS: 0 days    Time spent: 30 minutes   Annise Boran Darleen Crocker, DO [facility] Pager [phone]  If 7PM-7AM, please contact night-coverage [email or web address] Password [email or web address] 10/30/2016, 6:10 PM

## 2016-10-30 NOTE — Care Management Note (Signed)
Case Management Note  Patient Details  Name: AJIT ERRICO MRN: [id number] Date of Birth: 21-Feb-1960  Subjective/Objective:                  nausea and vomiting  Action/Plan: Date:  October 30, 2016 Chart reviewed for concurrent status and case management needs.  Will continue to follow patient progress.  Discharge Planning: following for needs  Expected discharge date: [id number]  Velva Harman, BSN, [place], [place]   Expected Discharge Date:   (UNKNOWN)               Expected Discharge Plan:  Home/Self Care  In-House Referral:     Discharge planning Services  CM Consult  Post Acute Care Choice:    Choice offered to:     DME Arranged:    DME Agency:     HH Arranged:    HH Agency:     Status of Service:  In process, will continue to follow  If discussed at Long Length of Stay Meetings, dates discussed:    Additional Comments:  Leeroy Cha, RN 10/30/2016, 8:49 AM

## 2016-10-30 NOTE — Progress Notes (Signed)
Initial Nutrition Assessment  DOCUMENTATION CODES:   Underweight, Severe malnutrition in context of chronic illness  INTERVENTION:    Monitor for diet advancement/toleration  Boost Breeze po TID, each supplement provides 250 kcal and 9 grams of protein  Monitor and supplement electrolytes as need per MD descretion.   NUTRITION DIAGNOSIS:   Malnutrition (Severe) related to chronic illness, cancer and cancer related treatments as evidenced by severe depletion of body fat, severe depletion of muscle mass, energy intake < or equal to 50% for > or equal to 1 month.  GOAL:   Patient will meet greater than or equal to 90% of their needs  MONITOR:   PO intake, Supplement acceptance, Weight trends, Labs, Diet advancement  REASON FOR ASSESSMENT:   Malnutrition Screening Tool    ASSESSMENT:   Pt with PMH significant for small lung cancer (diagnosed 07/2016), chronic airway obstruction, chronic pancreatitis, CAD, HTN, alcohol abuse, and seizures. Admitted 10/9 for diarrhea resulting in hypokalemia, left AMA. Pt has endoscopy 10/12 which showed esophageal ulcerations with pyloris mass. Presents this admission with ongoing diarrhea and left lower extremity weakness.   Spoke with pt at bedside. Reports he is not using at home tube feeding formula out of fear of vomiting. States he is trying to eat all of his calories versus getting them from tube feeding. Pt typically consumes one meal per day that consist of little debbie cakes. Suspect pt has not consumed >50% of EER for > 1 month.   At home tube feeding regimen goes as follow: Vital 1.2 at 95 mL an hour over 14 hours via PEG.  Records indicate pt has lost 2% of body weight in one week. This is significant.   Nutrition-Focused physical exam completed. Findings are severe fat depletion, severe muscle depletion, and no edema.   If diet is not upgraded would recommend using PEG tube for tube feedings as this pt is severely malnourished.  This pt's nutrition status has worsened over the course of one week.   Medications reviewed and include: creon, LR @ 50 ml/hr Labs reviewed: Mg 1.4 (L) calcium ionized 0.96 (L)  Diet Order:  Diet clear liquid Room service appropriate? Yes; Fluid consistency: Thin  Skin:  Reviewed, no issues  Last BM:  10/29/16  Height:   Ht Readings from Last 1 Encounters:  10/29/16 5\' 7"  (1.702 m)    Weight:   Wt Readings from Last 1 Encounters:  10/29/16 101 lb (45.8 kg)    Ideal Body Weight:  64.5 kg  BMI:  Body mass index is 15.82 kg/m.  Estimated Nutritional Needs:   Kcal:  1600-1800 kcal (35-39 kcal/kg)  Protein:  80-95 grams (1.7-2.1 g/kg)  Fluid:  >1.6 L/day  EDUCATION NEEDS:   Education needs addressed  [place], LDN Clinical Nutrition Pager # [phone]

## 2016-10-31 ENCOUNTER — Encounter: Payer: Self-pay | Admitting: Oncology

## 2016-10-31 DIAGNOSIS — C3412 Malignant neoplasm of upper lobe, left bronchus or lung: Secondary | ICD-10-CM | POA: Diagnosis present

## 2016-10-31 DIAGNOSIS — E876 Hypokalemia: Secondary | ICD-10-CM | POA: Diagnosis not present

## 2016-10-31 DIAGNOSIS — K219 Gastro-esophageal reflux disease without esophagitis: Secondary | ICD-10-CM | POA: Diagnosis present

## 2016-10-31 DIAGNOSIS — G4700 Insomnia, unspecified: Secondary | ICD-10-CM | POA: Diagnosis present

## 2016-10-31 DIAGNOSIS — J449 Chronic obstructive pulmonary disease, unspecified: Secondary | ICD-10-CM | POA: Diagnosis present

## 2016-10-31 DIAGNOSIS — R911 Solitary pulmonary nodule: Secondary | ICD-10-CM | POA: Diagnosis present

## 2016-10-31 DIAGNOSIS — Z681 Body mass index (BMI) 19 or less, adult: Secondary | ICD-10-CM | POA: Diagnosis not present

## 2016-10-31 DIAGNOSIS — K8681 Exocrine pancreatic insufficiency: Secondary | ICD-10-CM | POA: Diagnosis present

## 2016-10-31 DIAGNOSIS — K861 Other chronic pancreatitis: Secondary | ICD-10-CM | POA: Diagnosis present

## 2016-10-31 DIAGNOSIS — R64 Cachexia: Secondary | ICD-10-CM | POA: Diagnosis present

## 2016-10-31 DIAGNOSIS — F79 Unspecified intellectual disabilities: Secondary | ICD-10-CM | POA: Diagnosis present

## 2016-10-31 DIAGNOSIS — K920 Hematemesis: Secondary | ICD-10-CM | POA: Diagnosis present

## 2016-10-31 DIAGNOSIS — D62 Acute posthemorrhagic anemia: Secondary | ICD-10-CM | POA: Diagnosis not present

## 2016-10-31 DIAGNOSIS — I700 Atherosclerosis of aorta: Secondary | ICD-10-CM | POA: Diagnosis present

## 2016-10-31 DIAGNOSIS — E43 Unspecified severe protein-calorie malnutrition: Secondary | ICD-10-CM | POA: Diagnosis present

## 2016-10-31 DIAGNOSIS — F17210 Nicotine dependence, cigarettes, uncomplicated: Secondary | ICD-10-CM | POA: Diagnosis present

## 2016-10-31 DIAGNOSIS — K449 Diaphragmatic hernia without obstruction or gangrene: Secondary | ICD-10-CM | POA: Diagnosis present

## 2016-10-31 DIAGNOSIS — R197 Diarrhea, unspecified: Secondary | ICD-10-CM | POA: Diagnosis present

## 2016-10-31 DIAGNOSIS — G894 Chronic pain syndrome: Secondary | ICD-10-CM | POA: Diagnosis present

## 2016-10-31 DIAGNOSIS — I10 Essential (primary) hypertension: Secondary | ICD-10-CM | POA: Diagnosis present

## 2016-10-31 DIAGNOSIS — Z9221 Personal history of antineoplastic chemotherapy: Secondary | ICD-10-CM | POA: Diagnosis not present

## 2016-10-31 DIAGNOSIS — F419 Anxiety disorder, unspecified: Secondary | ICD-10-CM | POA: Diagnosis present

## 2016-10-31 DIAGNOSIS — I2510 Atherosclerotic heart disease of native coronary artery without angina pectoris: Secondary | ICD-10-CM | POA: Diagnosis present

## 2016-10-31 DIAGNOSIS — Z923 Personal history of irradiation: Secondary | ICD-10-CM | POA: Diagnosis not present

## 2016-10-31 LAB — BASIC METABOLIC PANEL
ANION GAP: 4 — AB (ref 5–15)
BUN: <5 mg/dL — ABNORMAL LOW (ref 6–20)
CHLORIDE: 104 mmol/L (ref 101–111)
CO2: 30 mmol/L (ref 22–32)
Calcium: 8 mg/dL — ABNORMAL LOW (ref 8.9–10.3)
Creatinine, Ser: 0.71 mg/dL (ref 0.61–1.24)
Glucose, Bld: 86 mg/dL (ref 65–99)
POTASSIUM: 4.4 mmol/L (ref 3.5–5.1)
Sodium: 138 mmol/L (ref 135–145)

## 2016-10-31 LAB — TYPE AND SCREEN
ABO/RH(D): O POS
Antibody Screen: NEGATIVE
Unit division: 0

## 2016-10-31 LAB — BPAM RBC
Blood Product Expiration Date: 201811162359
ISSUE DATE / TIME: 201810191735
Unit Type and Rh: 5100

## 2016-10-31 LAB — CBC
HEMATOCRIT: 26.8 % — AB (ref 39.0–52.0)
HEMOGLOBIN: 9.2 g/dL — AB (ref 13.0–17.0)
MCH: 33.1 pg (ref 26.0–34.0)
MCHC: 34.3 g/dL (ref 30.0–36.0)
MCV: 96.4 fL (ref 78.0–100.0)
Platelets: 128 10*3/uL — ABNORMAL LOW (ref 150–400)
RBC: 2.78 MIL/uL — ABNORMAL LOW (ref 4.22–5.81)
RDW: 14.7 % (ref 11.5–15.5)
WBC: 5 10*3/uL (ref 4.0–10.5)

## 2016-10-31 LAB — HEMOGLOBIN AND HEMATOCRIT, BLOOD
HEMATOCRIT: 27.8 % — AB (ref 39.0–52.0)
Hemoglobin: 9.3 g/dL — ABNORMAL LOW (ref 13.0–17.0)

## 2016-10-31 MED ORDER — GABAPENTIN 300 MG PO CAPS: 300.0000 mg | ORAL_CAPSULE | Freq: Three times a day (TID) | ORAL | 0 refills | Status: AC

## 2016-10-31 MED ORDER — HEPARIN SOD (PORK) LOCK FLUSH 100 UNIT/ML IV SOLN
500.0000 [IU] | INTRAVENOUS | Status: DC
  Filled 2016-10-31: qty 5

## 2016-10-31 MED ORDER — HEPARIN SOD (PORK) LOCK FLUSH 100 UNIT/ML IV SOLN
500.0000 [IU] | INTRAVENOUS | Status: DC | PRN
  Administered 2016-10-31: 500 [IU]
  Filled 2016-10-31: qty 5

## 2016-10-31 NOTE — Care Management Note (Signed)
Case Management Note  Patient Details  Name: Marcus Brooks MRN: [id number] Date of Birth: March 14, 1960  Subjective/Objective:   Hypokalemia, hematemesis, small cell carcinoma of upper left lobe, diarrhea                 Action/Plan: Discharge Planning: NCM spoke to pt and offered [place] to pt. Explained HH PT/OT would be an out of pocket expense. His insurance has exclusion on coverage for [facility] PT/OT. States he declines HH PT/OT at this time. He is requesting a RW with seat due to increase weakness. NCM notified attending. Contacted [facility] for RW with seat to be delivered to room prior to dc.   PCP [name] MD    Expected Discharge Date:  10/31/16               Expected Discharge Plan:  Home/Self Care  In-House Referral:  NA  Discharge planning Services  CM Consult  Post Acute Care Choice:  Home Health Choice offered to:  Patient  DME Arranged:  [place] rolling with seat DME Agency:  [place] Arranged:  Patient Refused [place] Agency:  NA  Status of Service:  Completed, signed off  If discussed at [place] of Stay Meetings, dates discussed:    Additional Comments:  Erenest Rasher, RN 10/31/2016, 9:42 AM
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
## 2016-10-31 NOTE — Progress Notes (Signed)
Pt port a cath deaccessed. Rolling walker delivered to room. Reviewed AVS w/ pt in room. Pt safely walked to lobby for d/c home. Kizzie Ide, RN

## 2016-11-03 ENCOUNTER — Encounter: Payer: Medicaid Other | Admitting: Nutrition

## 2016-11-03 ENCOUNTER — Ambulatory Visit (HOSPITAL_BASED_OUTPATIENT_CLINIC_OR_DEPARTMENT_OTHER): Payer: Medicaid Other | Admitting: Internal Medicine

## 2016-11-03 ENCOUNTER — Encounter: Payer: Self-pay | Admitting: Internal Medicine

## 2016-11-03 ENCOUNTER — Encounter: Payer: Self-pay | Admitting: *Deleted

## 2016-11-03 ENCOUNTER — Ambulatory Visit (HOSPITAL_BASED_OUTPATIENT_CLINIC_OR_DEPARTMENT_OTHER): Payer: Medicaid Other

## 2016-11-03 ENCOUNTER — Other Ambulatory Visit (HOSPITAL_BASED_OUTPATIENT_CLINIC_OR_DEPARTMENT_OTHER): Payer: Medicaid Other

## 2016-11-03 ENCOUNTER — Telehealth: Payer: Self-pay | Admitting: Medical Oncology

## 2016-11-03 VITALS — BP 116/76 | HR 118 | Temp 98.3°F | Resp 18 | Ht 67.0 in | Wt 95.9 lb

## 2016-11-03 VITALS — BP 98/72 | HR 77 | Resp 18

## 2016-11-03 DIAGNOSIS — E43 Unspecified severe protein-calorie malnutrition: Secondary | ICD-10-CM

## 2016-11-03 DIAGNOSIS — E860 Dehydration: Secondary | ICD-10-CM

## 2016-11-03 DIAGNOSIS — C3412 Malignant neoplasm of upper lobe, left bronchus or lung: Secondary | ICD-10-CM

## 2016-11-03 DIAGNOSIS — R112 Nausea with vomiting, unspecified: Secondary | ICD-10-CM | POA: Diagnosis not present

## 2016-11-03 DIAGNOSIS — Z7189 Other specified counseling: Secondary | ICD-10-CM

## 2016-11-03 DIAGNOSIS — Z5111 Encounter for antineoplastic chemotherapy: Secondary | ICD-10-CM

## 2016-11-03 LAB — CBC WITH DIFFERENTIAL/PLATELET
BASO%: 1.1 % (ref 0.0–2.0)
BASOS ABS: 0.1 10*3/uL (ref 0.0–0.1)
EOS ABS: 0.3 10*3/uL (ref 0.0–0.5)
EOS%: 2 % (ref 0.0–7.0)
HCT: 37 % — ABNORMAL LOW (ref 38.4–49.9)
HGB: 12.5 g/dL — ABNORMAL LOW (ref 13.0–17.1)
LYMPH%: 10.8 % — AB (ref 14.0–49.0)
MCH: 32.4 pg (ref 27.2–33.4)
MCHC: 33.6 g/dL (ref 32.0–36.0)
MCV: 96.2 fL (ref 79.3–98.0)
MONO#: 0.9 10*3/uL (ref 0.1–0.9)
MONO%: 6.6 % (ref 0.0–14.0)
NEUT#: 10.8 10*3/uL — ABNORMAL HIGH (ref 1.5–6.5)
NEUT%: 79.5 % — AB (ref 39.0–75.0)
PLATELETS: 207 10*3/uL (ref 140–400)
RBC: 3.85 10*6/uL — AB (ref 4.20–5.82)
RDW: 14.6 % (ref 11.0–14.6)
WBC: 13.6 10*3/uL — AB (ref 4.0–10.3)
lymph#: 1.5 10*3/uL (ref 0.9–3.3)

## 2016-11-03 LAB — COMPREHENSIVE METABOLIC PANEL
ALT: 8 U/L (ref 0–55)
ANION GAP: 12 meq/L — AB (ref 3–11)
AST: 15 U/L (ref 5–34)
Albumin: 3.4 g/dL — ABNORMAL LOW (ref 3.5–5.0)
Alkaline Phosphatase: 121 U/L (ref 40–150)
BUN: 7.7 mg/dL (ref 7.0–26.0)
CO2: 29 meq/L (ref 22–29)
Calcium: 9 mg/dL (ref 8.4–10.4)
Chloride: 96 mEq/L — ABNORMAL LOW (ref 98–109)
Creatinine: 1.1 mg/dL (ref 0.7–1.3)
GLUCOSE: 124 mg/dL (ref 70–140)
POTASSIUM: 3.3 meq/L — AB (ref 3.5–5.1)
Sodium: 137 mEq/L (ref 136–145)
Total Bilirubin: 0.46 mg/dL (ref 0.20–1.20)
Total Protein: 7.6 g/dL (ref 6.4–8.3)

## 2016-11-03 LAB — MAGNESIUM: Magnesium: 1.9 mg/dl (ref 1.5–2.5)

## 2016-11-03 MED ORDER — SODIUM CHLORIDE 0.9 % IV SOLN
Freq: Once | INTRAVENOUS | Status: AC
  Administered 2016-11-03: 11:00:00 via INTRAVENOUS

## 2016-11-03 MED ORDER — ONDANSETRON HCL 4 MG/2ML IJ SOLN
8.0000 mg | Freq: Once | INTRAMUSCULAR | Status: AC
  Administered 2016-11-03: 8 mg via INTRAVENOUS

## 2016-11-03 MED ORDER — SODIUM CHLORIDE 0.9 % IV SOLN: Freq: Once | INTRAVENOUS | Status: DC

## 2016-11-03 MED ORDER — ONDANSETRON HCL 4 MG/2ML IJ SOLN
INTRAMUSCULAR | Status: AC
  Filled 2016-11-03: qty 4

## 2016-11-03 MED ORDER — SODIUM CHLORIDE 0.9% FLUSH
10.0000 mL | INTRAVENOUS | Status: DC | PRN
  Administered 2016-11-03: 10 mL
  Filled 2016-11-03: qty 10

## 2016-11-03 MED ORDER — HEPARIN SOD (PORK) LOCK FLUSH 100 UNIT/ML IV SOLN
500.0000 [IU] | Freq: Once | INTRAVENOUS | Status: AC | PRN
  Administered 2016-11-03: 500 [IU]
  Filled 2016-11-03: qty 5

## 2016-11-03 NOTE — Patient Instructions (Signed)
Steps to Quit Smoking Smoking tobacco can be bad for your health. It can also affect almost every organ in your body. Smoking puts you and people around you at risk for many serious long-lasting (chronic) diseases. Quitting smoking is hard, but it is one of the best things that you can do for your health. It is never too late to quit. What are the benefits of quitting smoking? When you quit smoking, you lower your risk for getting serious diseases and conditions. They can include:  Lung cancer or lung disease.  Heart disease.  Stroke.  Heart attack.  Not being able to have children (infertility).  Weak bones (osteoporosis) and broken bones (fractures).  If you have coughing, wheezing, and shortness of breath, those symptoms may get better when you quit. You may also get sick less often. If you are pregnant, quitting smoking can help to lower your chances of having a baby of low birth weight. What can I do to help me quit smoking? Talk with your doctor about what can help you quit smoking. Some things you can do (strategies) include:  Quitting smoking totally, instead of slowly cutting back how much you smoke over a period of time.  Going to in-person counseling. You are more likely to quit if you go to many counseling sessions.  Using resources and support systems, such as: ? Online chats with a counselor. ? Phone quitlines. ? Printed self-help materials. ? Support groups or group counseling. ? Text messaging programs. ? Mobile phone apps or applications.  Taking medicines. Some of these medicines may have nicotine in them. If you are pregnant or breastfeeding, do not take any medicines to quit smoking unless your doctor says it is okay. Talk with your doctor about counseling or other things that can help you.  Talk with your doctor about using more than one strategy at the same time, such as taking medicines while you are also going to in-person counseling. This can help make  quitting easier. What things can I do to make it easier to quit? Quitting smoking might feel very hard at first, but there is a lot that you can do to make it easier. Take these steps:  Talk to your family and friends. Ask them to support and encourage you.  Call phone quitlines, reach out to support groups, or work with a counselor.  Ask people who smoke to not smoke around you.  Avoid places that make you want (trigger) to smoke, such as: ? Bars. ? Parties. ? Smoke-break areas at work.  Spend time with people who do not smoke.  Lower the stress in your life. Stress can make you want to smoke. Try these things to help your stress: ? Getting regular exercise. ? Deep-breathing exercises. ? Yoga. ? Meditating. ? Doing a body scan. To do this, close your eyes, focus on one area of your body at a time from head to toe, and notice which parts of your body are tense. Try to relax the muscles in those areas.  Download or buy apps on your mobile phone or tablet that can help you stick to your quit plan. There are many free apps, such as QuitGuide from the CDC (Centers for Disease Control and Prevention). You can find more support from smokefree.gov and other websites.  This information is not intended to replace advice given to you by your health care provider. Make sure you discuss any questions you have with your health care provider. Document Released: 10/25/2008 Document   Revised: 08/27/2015 Document Reviewed: 05/15/2014 Elsevier Interactive Patient Education  2018 [employer]vier Inc.  

## 2016-11-03 NOTE — Telephone Encounter (Signed)
Referred pt to [facility]

## 2016-11-03 NOTE — Progress Notes (Signed)
Oncology Nurse Navigator Documentation  Oncology Nurse Navigator Flowsheets 11/03/2016  Navigator Location [facility]  Navigator Encounter Type Clinic/MDC/I spoke with patient today.  Marcus Brooks is not tolerating systemic therapy.  Dr. Julien Nordmann spoke to patient about Hospice and patient agreed.  I help educate patient and girlfriend on Hospice. I will cancel appt for chemo.  I updated dietitian on him going to Hospice.   Patient Visit Type MedOnc  Treatment Phase Treatment  Barriers/Navigation Needs Education  Education Other  Interventions Education;Referrals  Referrals Other  Coordination of Care Other  Education Method Verbal  Acuity Level 2  Time Spent with Patient 30

## 2016-11-03 NOTE — Patient Instructions (Signed)
Dehydration, Adult Dehydration is when there is not enough fluid or water in your body. This happens when you lose more fluids than you take in. Dehydration can range from mild to very bad. It should be treated right away to keep it from getting very bad. Symptoms of mild dehydration may include:  Thirst.  Dry lips.  Slightly dry mouth.  Dry, warm skin.  Dizziness. Symptoms of moderate dehydration may include:  Very dry mouth.  Muscle cramps.  Dark pee (urine). Pee may be the color of tea.  Your body making less pee.  Your eyes making fewer tears.  Heartbeat that is uneven or faster than normal (palpitations).  Headache.  Light-headedness, especially when you stand up from sitting.  Fainting (syncope). Symptoms of very bad dehydration may include:  Changes in skin, such as: ? Cold and clammy skin. ? Blotchy (mottled) or pale skin. ? Skin that does not quickly return to normal after being lightly pinched and let go (poor skin turgor).  Changes in body fluids, such as: ? Feeling very thirsty. ? Your eyes making fewer tears. ? Not sweating when body temperature is high, such as in hot weather. ? Your body making very little pee.  Changes in vital signs, such as: ? Weak pulse. ? Pulse that is more than 100 beats a minute when you are sitting still. ? Fast breathing. ? Low blood pressure.  Other changes, such as: ? Sunken eyes. ? Cold hands and feet. ? Confusion. ? Lack of energy (lethargy). ? Trouble waking up from sleep. ? Short-term weight loss. ? Unconsciousness. Follow these instructions at home:  If told by your doctor, drink an ORS: ? Make an ORS by using instructions on the package. ? Start by drinking small amounts, about  cup (120 mL) every 5-10 minutes. ? Slowly drink more until you have had the amount that your doctor said to have.  Drink enough clear fluid to keep your pee clear or pale yellow. If you were told to drink an ORS, finish the ORS  first, then start slowly drinking clear fluids. Drink fluids such as: ? Water. Do not drink only water by itself. Doing that can make the salt (sodium) level in your body get too low (hyponatremia). ? Ice chips. ? Fruit juice that you have added water to (diluted). ? Low-calorie sports drinks.  Avoid: ? Alcohol. ? Drinks that have a lot of sugar. These include high-calorie sports drinks, fruit juice that does not have water added, and soda. ? Caffeine. ? Foods that are greasy or have a lot of fat or sugar.  Take over-the-counter and prescription medicines only as told by your doctor.  Do not take salt tablets. Doing that can make the salt level in your body get too high (hypernatremia).  Eat foods that have minerals (electrolytes). Examples include bananas, oranges, potatoes, tomatoes, and spinach.  Keep all follow-up visits as told by your doctor. This is important. Contact a doctor if:  You have belly (abdominal) pain that: ? Gets worse. ? Stays in one area (localizes).  You have a rash.  You have a stiff neck.  You get angry or annoyed more easily than normal (irritability).  You are more sleepy than normal.  You have a harder time waking up than normal.  You feel: ? Weak. ? Dizzy. ? Very thirsty.  You have peed (urinated) only a small amount of very dark pee during 6-8 hours. Get help right away if:  You have symptoms of   very bad dehydration.  You cannot drink fluids without throwing up (vomiting).  Your symptoms get worse with treatment.  You have a fever.  You have a very bad headache.  You are throwing up or having watery poop (diarrhea) and it: ? Gets worse. ? Does not go away.  You have blood or something green (bile) in your throw-up.  You have blood in your poop (stool). This may cause poop to look black and tarry.  You have not peed in 6-8 hours.  You pass out (faint).  Your heart rate when you are sitting still is more than 100 beats a  minute.  You have trouble breathing. This information is not intended to replace advice given to you by your health care provider. Make sure you discuss any questions you have with your health care provider. Document Released: 10/25/2008 Document Revised: 07/19/2015 Document Reviewed: 02/22/2015 Elsevier Interactive Patient Education  2018 Elsevier Inc.  
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
## 2016-11-04 ENCOUNTER — Ambulatory Visit: Payer: Medicaid Other

## 2016-11-05 ENCOUNTER — Ambulatory Visit: Payer: Medicaid Other

## 2016-11-06 ENCOUNTER — Ambulatory Visit: Payer: Medicaid Other | Admitting: Family Medicine
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
## 2016-11-09 ENCOUNTER — Telehealth: Payer: Self-pay | Admitting: Internal Medicine

## 2016-11-09 NOTE — Telephone Encounter (Signed)
Per 10/23 los - No follow-up visit is needed. The patient will be referred to hospice.

## 2016-11-23 ENCOUNTER — Telehealth: Payer: Self-pay | Admitting: Medical Oncology

## 2016-11-23 NOTE — Telephone Encounter (Signed)
He is on hospice. No need for scans unless he is revoking hospice.

## 2016-11-23 NOTE — Telephone Encounter (Signed)
I called pt to cancel appt for tomorrow as pt is on hospice.  He wants to know when he can get a scan to see how his cancer is doing. Note to [place].

## 2016-11-24 ENCOUNTER — Ambulatory Visit: Payer: Medicaid Other | Admitting: Internal Medicine

## 2016-11-24 ENCOUNTER — Ambulatory Visit: Payer: Medicaid Other

## 2016-11-24 ENCOUNTER — Other Ambulatory Visit: Payer: Medicaid Other

## 2016-11-24 NOTE — Telephone Encounter (Signed)
Tried to call pt . No answer.

## 2016-11-25 ENCOUNTER — Ambulatory Visit: Payer: Medicaid Other

## 2016-11-26 ENCOUNTER — Ambulatory Visit: Payer: Medicaid Other

## 2016-12-30 ENCOUNTER — Ambulatory Visit: Payer: Medicaid Other | Admitting: Family Medicine

## 2017-01-04 ENCOUNTER — Other Ambulatory Visit: Payer: Self-pay | Admitting: Family Medicine

## 2017-01-25 ENCOUNTER — Telehealth: Payer: Self-pay | Admitting: Medical Oncology

## 2017-01-30 ENCOUNTER — Other Ambulatory Visit: Payer: Self-pay | Admitting: Nurse Practitioner

## 2017-02-12 NOTE — Telephone Encounter (Signed)
FYI

## 2017-02-12 DEATH — deceased

## 2017-10-04 ENCOUNTER — Ambulatory Visit: Payer: Medicaid Other | Admitting: Neurology

## 2018-05-10 NOTE — Telephone Encounter (Signed)
Message sent to provider 

## 2018-05-11 NOTE — Telephone Encounter (Signed)
Message sent to provider 

## 2018-05-13 NOTE — Telephone Encounter (Signed)
Message sent to provider 

## 2018-10-07 IMAGING — CT CT CHEST W/O CM
2 of 5 series · 15 of 36 positions shown, 18 images · non-contrast
Comparison: None.

CLINICAL DATA: Smoker for 5 years. Chest pain. Abnormal chest
x-ray.

EXAM:
CT CHEST WITHOUT CONTRAST
TECHNIQUE: Multidetector CT imaging of the chest was performed following the
standard protocol without IV contrast.

[Series 4: super d · axial · 0.67mm/px · z∈[+1350,+1647]mm · 12 of 351 slices shown, 15 images]
[im 27/351  mediastinal]
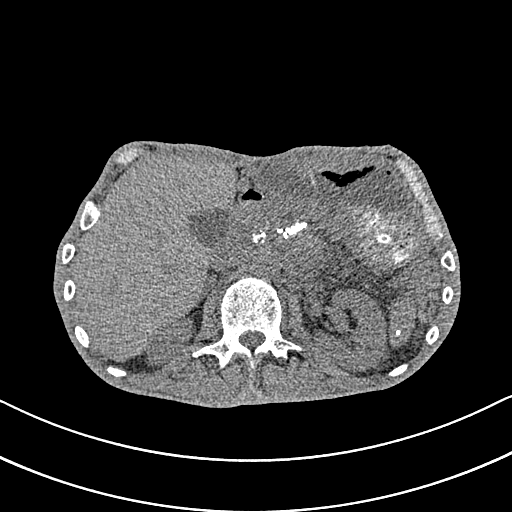
[im 27/351  lung]
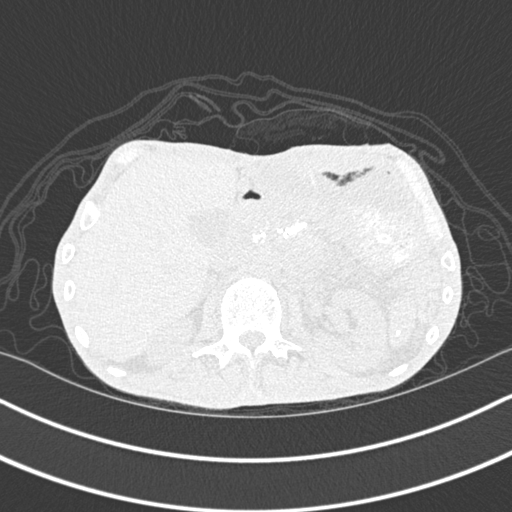
[im 54/351  lung]
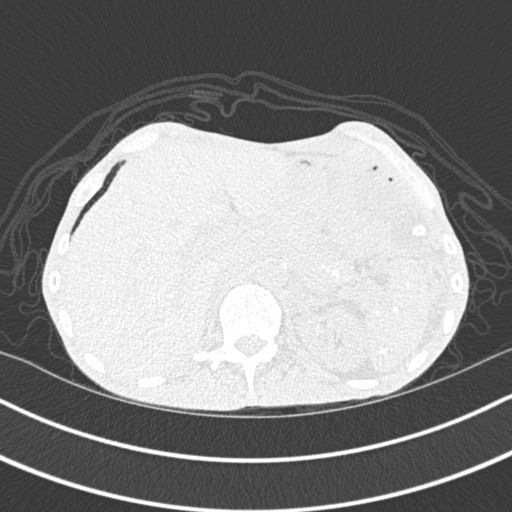
[im 81/351  lung]
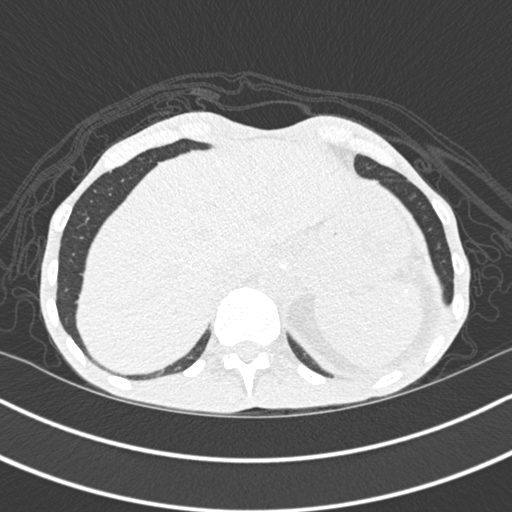
[im 108/351  lung]
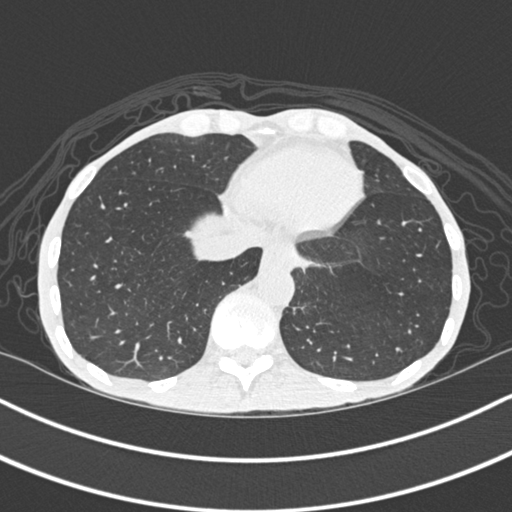
[im 135/351  mediastinal]
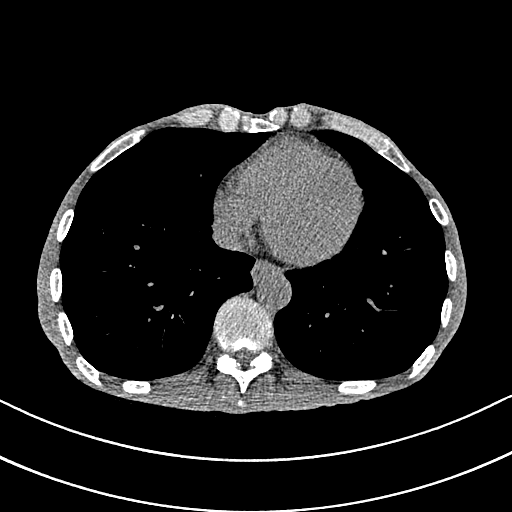
[im 135/351  lung]
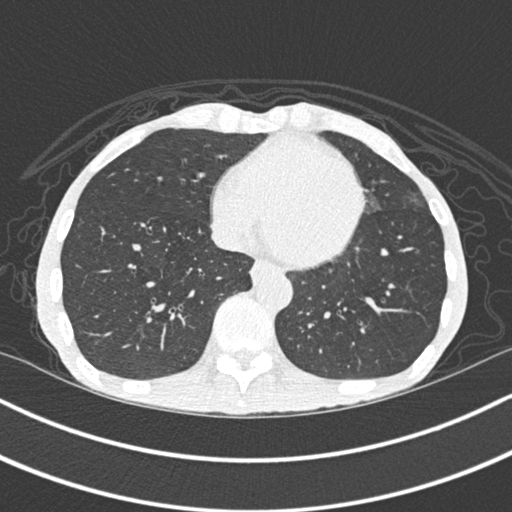
[im 162/351  lung]
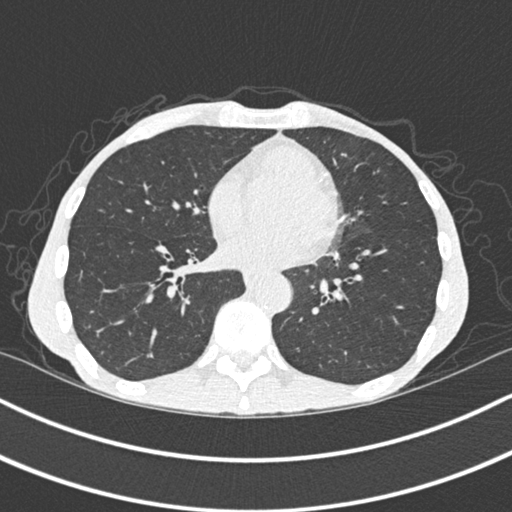
[im 189/351  lung]
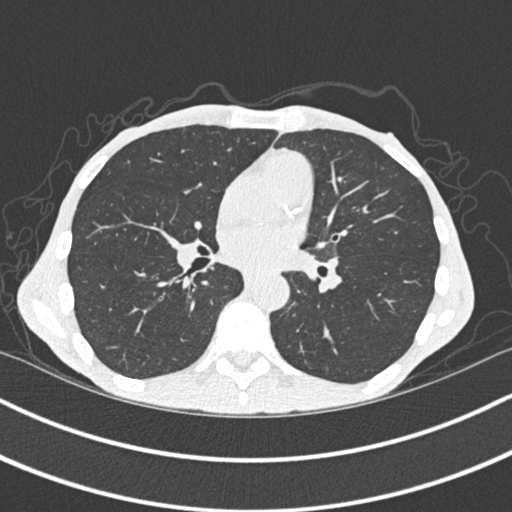
[im 216/351  lung]
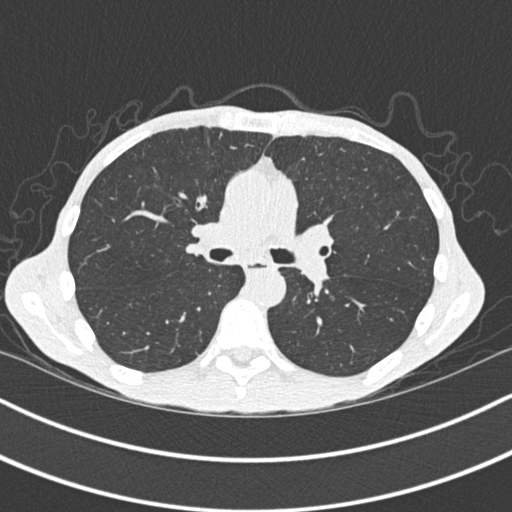
[im 243/351  mediastinal]
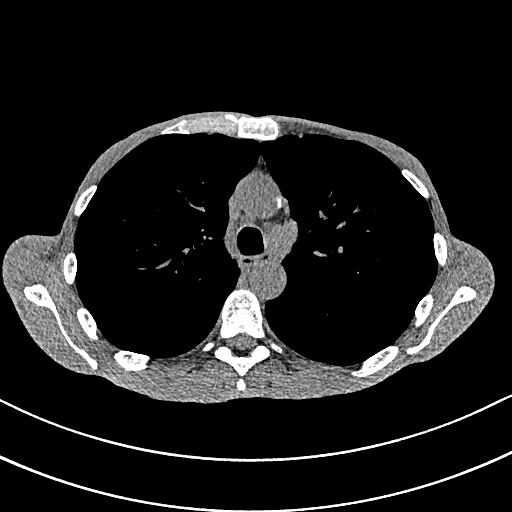
[im 243/351  lung]
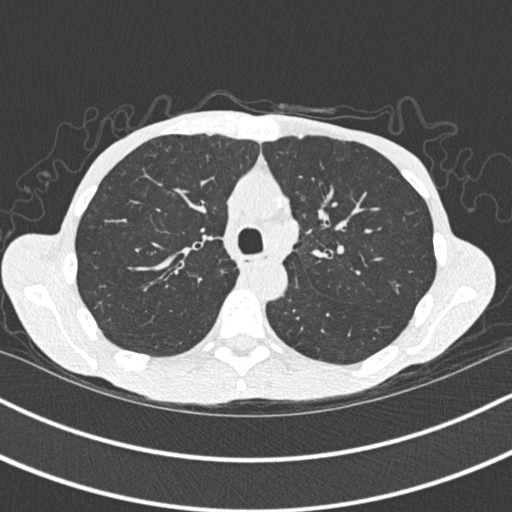
[im 270/351  lung]
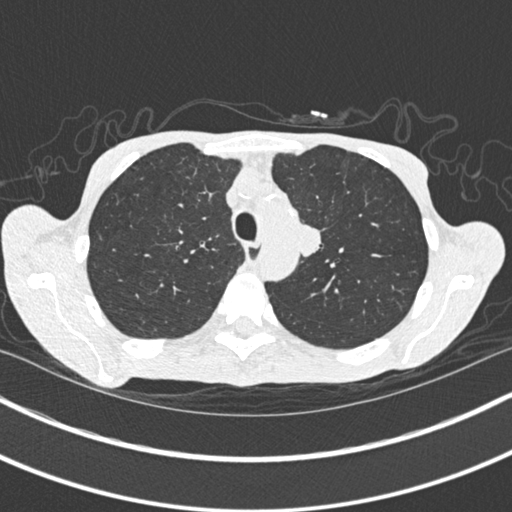
[im 297/351  lung]
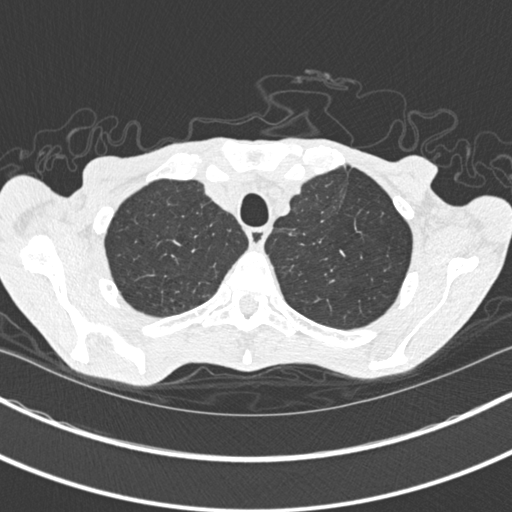
[im 324/351  lung]
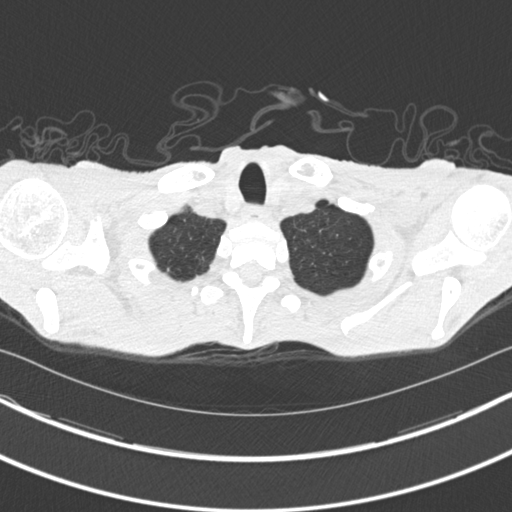

[Series 5: coronal · coronal · 0.71mm/px · 3 of 102 slices shown]
[im 21/102  lung]
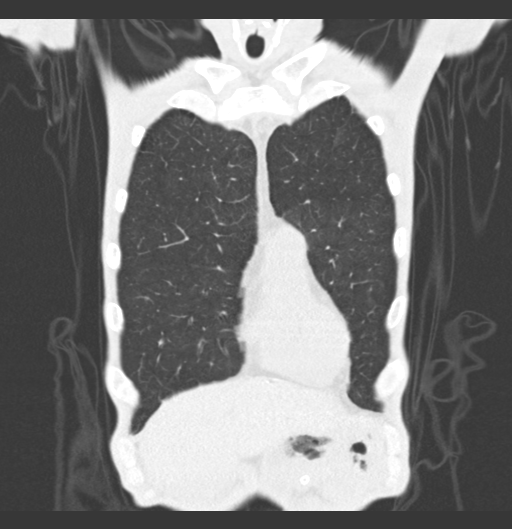
[im 41/102  lung]
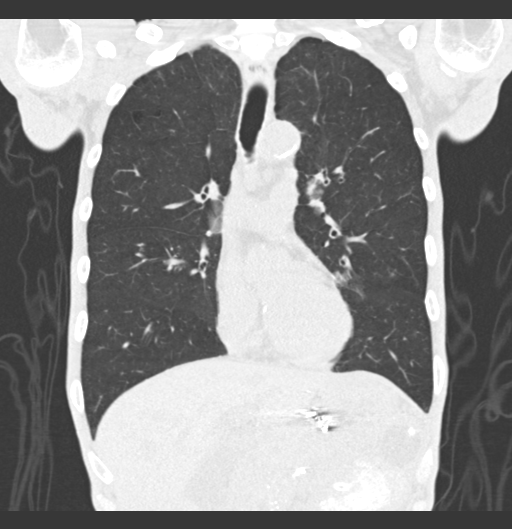
[im 61/102  lung]
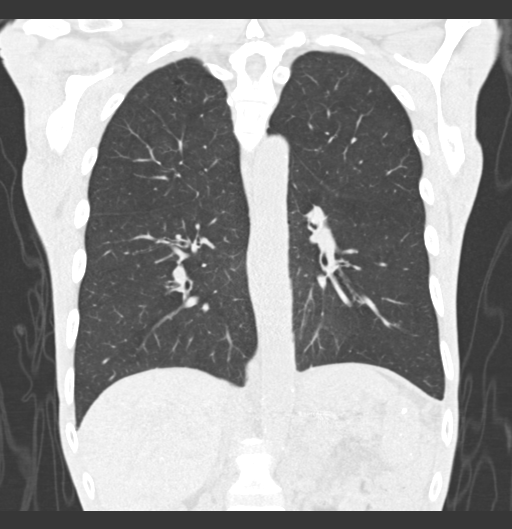

[15 of 36 positions shown; findings below may reference images not displayed]

FINDINGS: Cardiovascular: No significant vascular findings. Normal heart size.
No pericardial effusion. Coronary artery atherosclerosis in the LAD.

Mediastinum/Nodes: Enlarge AP window lymph node measuring 2 cm in
short axis. Ill-defined left hilar lymphadenopathy. Thyroid gland,
trachea, and esophagus demonstrate no significant findings.

Lungs/Pleura: No pleural effusion or pneumothorax. Chronic left
basilar pleural thickening. 1.4 x 1.7 cm nodule along the left
lateral margin of the aortic arch. No other pulmonary nodule or soft
tissue mass. Bilateral mild centrilobular emphysema. No pleural
effusion or pneumothorax.

Upper Abdomen: Gastrostomy tube in satisfactory position.
Calcifications in the pancreas as can be seen with chronic
pancreatitis or prior granulomatous disease. Calcifications noted in
the liver and spleen likely secondary to prior granulomatous
disease.

Musculoskeletal: No acute osseous abnormality. No lytic or sclerotic
osseous lesion.
IMPRESSION: 1. 1.4 x 1.7 cm nodule along the left lateral margin of the aortic
large. The nodule is isodense with the aorta. Left hilar and AP
window lymphadenopathy. Overall appearance is most concerning for
primary lung malignancy. CT of the chest with intravenous contrast
is recommended to completely exclude an aneurysm.
These results will be called to the ordering clinician or
representative by the Radiologist Assistant, and communication
documented in the PACS or zVision Dashboard.
